# Patient Record
Sex: Male | Born: 1935 | Race: White | Hispanic: No | Marital: Married | Smoking: Former smoker
Health system: Southern US, Community
[De-identification: ages and names within clinical notes are randomized; demographics above are authoritative.]

## PROBLEM LIST (undated history)

## (undated) DIAGNOSIS — Z954 Presence of other heart-valve replacement: Secondary | ICD-10-CM

## (undated) DIAGNOSIS — K429 Umbilical hernia without obstruction or gangrene: Secondary | ICD-10-CM

## (undated) DIAGNOSIS — R5383 Other fatigue: Secondary | ICD-10-CM

## (undated) DIAGNOSIS — E785 Hyperlipidemia, unspecified: Secondary | ICD-10-CM

## (undated) DIAGNOSIS — I214 Non-ST elevation (NSTEMI) myocardial infarction: Secondary | ICD-10-CM

## (undated) DIAGNOSIS — I359 Nonrheumatic aortic valve disorder, unspecified: Secondary | ICD-10-CM

## (undated) DIAGNOSIS — I10 Essential (primary) hypertension: Secondary | ICD-10-CM

## (undated) DIAGNOSIS — R609 Edema, unspecified: Secondary | ICD-10-CM

## (undated) DIAGNOSIS — C3490 Malignant neoplasm of unspecified part of unspecified bronchus or lung: Secondary | ICD-10-CM

## (undated) DIAGNOSIS — R0602 Shortness of breath: Secondary | ICD-10-CM

## (undated) DIAGNOSIS — T887XXA Unspecified adverse effect of drug or medicament, initial encounter: Secondary | ICD-10-CM

## (undated) DIAGNOSIS — I2510 Atherosclerotic heart disease of native coronary artery without angina pectoris: Secondary | ICD-10-CM

## (undated) DIAGNOSIS — L723 Sebaceous cyst: Secondary | ICD-10-CM

## (undated) DIAGNOSIS — J449 Chronic obstructive pulmonary disease, unspecified: Secondary | ICD-10-CM

## (undated) DIAGNOSIS — R5381 Other malaise: Secondary | ICD-10-CM

## (undated) DIAGNOSIS — Z8719 Personal history of other diseases of the digestive system: Secondary | ICD-10-CM

## (undated) DIAGNOSIS — K219 Gastro-esophageal reflux disease without esophagitis: Secondary | ICD-10-CM

## (undated) DIAGNOSIS — H8309 Labyrinthitis, unspecified ear: Secondary | ICD-10-CM

## (undated) DIAGNOSIS — I480 Paroxysmal atrial fibrillation: Secondary | ICD-10-CM

## (undated) DIAGNOSIS — I428 Other cardiomyopathies: Secondary | ICD-10-CM

## (undated) DIAGNOSIS — Z125 Encounter for screening for malignant neoplasm of prostate: Secondary | ICD-10-CM

## (undated) HISTORY — DX: Hyperlipidemia, unspecified: E78.5

## (undated) HISTORY — DX: Encounter for screening for malignant neoplasm of prostate: Z12.5

## (undated) HISTORY — DX: Labyrinthitis, unspecified ear: H83.09

## (undated) HISTORY — DX: Other fatigue: R53.83

## (undated) HISTORY — DX: Other cardiomyopathies: I42.8

## (undated) HISTORY — DX: Edema, unspecified: R60.9

## (undated) HISTORY — DX: Sebaceous cyst: L72.3

## (undated) HISTORY — PX: APPENDECTOMY: SHX54

## (undated) HISTORY — DX: Non-ST elevation (NSTEMI) myocardial infarction: I21.4

## (undated) HISTORY — DX: Personal history of other diseases of the digestive system: Z87.19

## (undated) HISTORY — DX: Other malaise: R53.81

## (undated) HISTORY — DX: Essential (primary) hypertension: I10

## (undated) HISTORY — PX: ABDOMINAL HERNIA REPAIR: SHX539

## (undated) HISTORY — DX: Unspecified adverse effect of drug or medicament, initial encounter: T88.7XXA

## (undated) HISTORY — DX: Umbilical hernia without obstruction or gangrene: K42.9

## (undated) HISTORY — PX: EYE SURGERY: SHX253

## (undated) HISTORY — DX: Presence of other heart-valve replacement: Z95.4

## (undated) HISTORY — DX: Atherosclerotic heart disease of native coronary artery without angina pectoris: I25.10

## (undated) HISTORY — DX: Nonrheumatic aortic valve disorder, unspecified: I35.9

## (undated) HISTORY — DX: Malignant neoplasm of unspecified part of unspecified bronchus or lung: C34.90

---

## 1992-02-11 HISTORY — PX: AORTIC VALVE REPLACEMENT: SHX41

## 2000-05-20 ENCOUNTER — Encounter (INDEPENDENT_AMBULATORY_CARE_PROVIDER_SITE_OTHER): Payer: Self-pay | Admitting: Specialist

## 2000-05-20 ENCOUNTER — Other Ambulatory Visit: Admission: RE | Admit: 2000-05-20 | Discharge: 2000-05-20 | Payer: Self-pay | Admitting: Gastroenterology

## 2001-05-12 ENCOUNTER — Ambulatory Visit: Admission: RE | Admit: 2001-05-12 | Discharge: 2001-05-12 | Payer: Self-pay

## 2002-03-03 ENCOUNTER — Encounter: Payer: Self-pay | Admitting: *Deleted

## 2002-03-03 ENCOUNTER — Ambulatory Visit (HOSPITAL_COMMUNITY): Admission: RE | Admit: 2002-03-03 | Discharge: 2002-03-03 | Payer: Self-pay | Admitting: *Deleted

## 2003-12-29 ENCOUNTER — Ambulatory Visit: Payer: Self-pay

## 2004-01-19 ENCOUNTER — Ambulatory Visit: Payer: Self-pay | Admitting: Internal Medicine

## 2004-02-06 ENCOUNTER — Ambulatory Visit: Payer: Self-pay | Admitting: Cardiology

## 2004-02-22 ENCOUNTER — Ambulatory Visit: Payer: Self-pay | Admitting: *Deleted

## 2004-02-27 ENCOUNTER — Ambulatory Visit: Payer: Self-pay | Admitting: Internal Medicine

## 2004-03-19 ENCOUNTER — Ambulatory Visit: Payer: Self-pay | Admitting: Cardiology

## 2004-04-16 ENCOUNTER — Ambulatory Visit: Payer: Self-pay | Admitting: Cardiovascular Disease

## 2004-05-14 ENCOUNTER — Ambulatory Visit: Payer: Self-pay | Admitting: Cardiology

## 2004-06-11 ENCOUNTER — Ambulatory Visit: Payer: Self-pay | Admitting: *Deleted

## 2004-07-09 ENCOUNTER — Ambulatory Visit: Payer: Self-pay | Admitting: *Deleted

## 2004-08-05 ENCOUNTER — Ambulatory Visit: Payer: Self-pay | Admitting: Internal Medicine

## 2004-08-26 ENCOUNTER — Ambulatory Visit: Payer: Self-pay | Admitting: Internal Medicine

## 2004-09-23 ENCOUNTER — Ambulatory Visit: Payer: Self-pay | Admitting: Cardiology

## 2004-10-15 ENCOUNTER — Ambulatory Visit: Payer: Self-pay | Admitting: Cardiology

## 2004-11-12 ENCOUNTER — Ambulatory Visit: Payer: Self-pay | Admitting: Internal Medicine

## 2004-12-10 ENCOUNTER — Ambulatory Visit: Payer: Self-pay | Admitting: Cardiology

## 2005-01-07 ENCOUNTER — Ambulatory Visit: Payer: Self-pay | Admitting: *Deleted

## 2005-02-04 ENCOUNTER — Ambulatory Visit: Payer: Self-pay | Admitting: Internal Medicine

## 2005-03-04 ENCOUNTER — Ambulatory Visit: Payer: Self-pay | Admitting: *Deleted

## 2005-03-26 ENCOUNTER — Ambulatory Visit: Payer: Self-pay | Admitting: *Deleted

## 2005-04-01 ENCOUNTER — Ambulatory Visit: Payer: Self-pay | Admitting: Cardiology

## 2005-04-01 ENCOUNTER — Ambulatory Visit: Payer: Self-pay | Admitting: *Deleted

## 2005-04-29 ENCOUNTER — Ambulatory Visit: Payer: Self-pay | Admitting: Cardiology

## 2005-05-27 ENCOUNTER — Ambulatory Visit: Payer: Self-pay | Admitting: Cardiology

## 2005-06-24 ENCOUNTER — Ambulatory Visit: Payer: Self-pay | Admitting: Cardiology

## 2005-07-22 ENCOUNTER — Ambulatory Visit: Payer: Self-pay | Admitting: *Deleted

## 2005-08-19 ENCOUNTER — Ambulatory Visit: Payer: Self-pay | Admitting: *Deleted

## 2005-09-24 ENCOUNTER — Ambulatory Visit: Payer: Self-pay

## 2005-09-24 ENCOUNTER — Ambulatory Visit: Payer: Self-pay | Admitting: *Deleted

## 2005-09-24 ENCOUNTER — Encounter: Payer: Self-pay | Admitting: Cardiology

## 2005-09-29 ENCOUNTER — Ambulatory Visit: Payer: Self-pay | Admitting: Cardiovascular Disease

## 2005-09-29 ENCOUNTER — Ambulatory Visit: Payer: Self-pay | Admitting: *Deleted

## 2005-10-15 ENCOUNTER — Ambulatory Visit: Payer: Self-pay | Admitting: *Deleted

## 2005-11-05 ENCOUNTER — Ambulatory Visit: Payer: Self-pay | Admitting: Cardiology

## 2005-12-03 ENCOUNTER — Ambulatory Visit: Payer: Self-pay | Admitting: *Deleted

## 2005-12-31 ENCOUNTER — Ambulatory Visit: Payer: Self-pay | Admitting: Cardiology

## 2006-01-28 ENCOUNTER — Ambulatory Visit: Payer: Self-pay | Admitting: *Deleted

## 2006-02-25 ENCOUNTER — Ambulatory Visit: Payer: Self-pay | Admitting: Cardiology

## 2006-03-25 ENCOUNTER — Ambulatory Visit: Payer: Self-pay | Admitting: *Deleted

## 2006-04-08 ENCOUNTER — Ambulatory Visit: Payer: Self-pay | Admitting: *Deleted

## 2006-04-20 ENCOUNTER — Ambulatory Visit: Payer: Self-pay

## 2006-04-20 ENCOUNTER — Ambulatory Visit: Payer: Self-pay | Admitting: *Deleted

## 2006-04-20 ENCOUNTER — Ambulatory Visit: Payer: Self-pay | Admitting: Internal Medicine

## 2006-04-20 LAB — CONVERTED CEMR LAB
ALT: 22 units/L (ref 0–40)
AST: 28 units/L (ref 0–37)
Albumin: 3.8 g/dL (ref 3.5–5.2)
Alkaline Phosphatase: 77 units/L (ref 39–117)
BUN: 13 mg/dL (ref 6–23)
Bilirubin, Direct: 0.2 mg/dL (ref 0.0–0.3)
CO2: 31 meq/L (ref 19–32)
Calcium: 9 mg/dL (ref 8.4–10.5)
Chloride: 99 meq/L (ref 96–112)
Cholesterol: 105 mg/dL (ref 0–200)
Creatinine, Ser: 0.9 mg/dL (ref 0.4–1.5)
GFR calc Af Amer: 107 mL/min
GFR calc non Af Amer: 89 mL/min
Glucose, Bld: 106 mg/dL — ABNORMAL HIGH (ref 70–99)
HDL: 30.3 mg/dL — ABNORMAL LOW (ref >39.0)
LDL Cholesterol: 44 mg/dL (ref 0–99)
Potassium: 4.2 meq/L (ref 3.5–5.1)
Sodium: 136 meq/L (ref 135–145)
Total Bilirubin: 0.9 mg/dL (ref 0.3–1.2)
Total CHOL/HDL Ratio: 3.5
Total Protein: 7.1 g/dL (ref 6.0–8.3)
Triglycerides: 155 mg/dL — ABNORMAL HIGH (ref 0–149)
VLDL: 31 mg/dL (ref 0–40)

## 2006-05-18 ENCOUNTER — Ambulatory Visit: Payer: Self-pay | Admitting: Cardiology

## 2006-06-15 ENCOUNTER — Ambulatory Visit: Payer: Self-pay | Admitting: Cardiovascular Disease

## 2006-07-07 ENCOUNTER — Ambulatory Visit: Payer: Self-pay | Admitting: Cardiology

## 2006-07-21 ENCOUNTER — Ambulatory Visit: Payer: Self-pay | Admitting: Cardiology

## 2006-08-04 ENCOUNTER — Ambulatory Visit: Payer: Self-pay | Admitting: Cardiology

## 2006-08-25 ENCOUNTER — Ambulatory Visit: Payer: Self-pay | Admitting: Cardiovascular Disease

## 2006-09-15 ENCOUNTER — Ambulatory Visit: Payer: Self-pay | Admitting: Cardiology

## 2006-09-29 ENCOUNTER — Ambulatory Visit: Payer: Self-pay | Admitting: Cardiology

## 2006-10-06 ENCOUNTER — Ambulatory Visit: Payer: Self-pay | Admitting: Cardiovascular Disease

## 2006-10-20 ENCOUNTER — Ambulatory Visit: Payer: Self-pay | Admitting: Cardiology

## 2006-11-17 ENCOUNTER — Ambulatory Visit: Payer: Self-pay | Admitting: Cardiovascular Disease

## 2006-12-18 ENCOUNTER — Ambulatory Visit: Payer: Self-pay | Admitting: Cardiology

## 2007-01-01 ENCOUNTER — Ambulatory Visit: Payer: Self-pay | Admitting: Internal Medicine

## 2007-01-18 ENCOUNTER — Ambulatory Visit: Payer: Self-pay | Admitting: Internal Medicine

## 2007-01-29 ENCOUNTER — Ambulatory Visit: Payer: Self-pay | Admitting: Cardiology

## 2007-02-19 ENCOUNTER — Ambulatory Visit: Payer: Self-pay | Admitting: Cardiology

## 2007-03-08 ENCOUNTER — Encounter: Payer: Self-pay | Admitting: Internal Medicine

## 2007-03-08 DIAGNOSIS — E785 Hyperlipidemia, unspecified: Secondary | ICD-10-CM | POA: Insufficient documentation

## 2007-03-08 DIAGNOSIS — I10 Essential (primary) hypertension: Secondary | ICD-10-CM | POA: Insufficient documentation

## 2007-03-08 DIAGNOSIS — I359 Nonrheumatic aortic valve disorder, unspecified: Secondary | ICD-10-CM | POA: Insufficient documentation

## 2007-03-09 ENCOUNTER — Ambulatory Visit: Payer: Self-pay | Admitting: Internal Medicine

## 2007-03-09 DIAGNOSIS — R609 Edema, unspecified: Secondary | ICD-10-CM

## 2007-03-09 LAB — CONVERTED CEMR LAB
ALT: 21 units/L (ref 0–53)
Alkaline Phosphatase: 77 units/L (ref 39–117)
BUN: 13 mg/dL (ref 6–23)
CO2: 30 meq/L (ref 19–32)
Calcium: 9.6 mg/dL (ref 8.4–10.5)
Chloride: 99 meq/L (ref 96–112)
Creatinine, Ser: 0.9 mg/dL (ref 0.4–1.5)
HDL: 33.4 mg/dL — ABNORMAL LOW (ref >39.0)
Total Bilirubin: 1.4 mg/dL — ABNORMAL HIGH (ref 0.3–1.2)
Total Protein: 7.4 g/dL (ref 6.0–8.3)

## 2007-03-10 DIAGNOSIS — Z8719 Personal history of other diseases of the digestive system: Secondary | ICD-10-CM

## 2007-03-11 ENCOUNTER — Encounter: Payer: Self-pay | Admitting: Internal Medicine

## 2007-03-12 ENCOUNTER — Ambulatory Visit: Payer: Self-pay | Admitting: Cardiology

## 2007-03-30 ENCOUNTER — Encounter: Payer: Self-pay | Admitting: Internal Medicine

## 2007-04-06 ENCOUNTER — Encounter: Payer: Self-pay | Admitting: Internal Medicine

## 2007-04-09 ENCOUNTER — Ambulatory Visit: Payer: Self-pay | Admitting: Internal Medicine

## 2007-04-14 ENCOUNTER — Ambulatory Visit: Payer: Self-pay | Admitting: Cardiovascular Disease

## 2007-05-07 ENCOUNTER — Ambulatory Visit: Payer: Self-pay | Admitting: Cardiology

## 2007-06-04 ENCOUNTER — Ambulatory Visit: Payer: Self-pay | Admitting: Internal Medicine

## 2007-06-25 ENCOUNTER — Ambulatory Visit: Payer: Self-pay | Admitting: Cardiology

## 2007-07-16 ENCOUNTER — Ambulatory Visit: Payer: Self-pay | Admitting: Cardiovascular Disease

## 2007-08-12 ENCOUNTER — Ambulatory Visit: Payer: Self-pay | Admitting: Cardiology

## 2007-08-23 ENCOUNTER — Ambulatory Visit: Payer: Self-pay | Admitting: Cardiology

## 2007-09-03 ENCOUNTER — Ambulatory Visit: Payer: Self-pay | Admitting: Internal Medicine

## 2007-09-03 DIAGNOSIS — H8309 Labyrinthitis, unspecified ear: Secondary | ICD-10-CM | POA: Insufficient documentation

## 2007-09-13 ENCOUNTER — Ambulatory Visit: Payer: Self-pay | Admitting: Cardiology

## 2007-10-04 ENCOUNTER — Ambulatory Visit: Payer: Self-pay | Admitting: Internal Medicine

## 2007-10-24 ENCOUNTER — Encounter: Payer: Self-pay | Admitting: Internal Medicine

## 2007-10-25 ENCOUNTER — Ambulatory Visit: Payer: Self-pay | Admitting: Cardiology

## 2007-11-22 ENCOUNTER — Ambulatory Visit: Payer: Self-pay | Admitting: Cardiology

## 2007-12-07 ENCOUNTER — Ambulatory Visit: Payer: Self-pay | Admitting: Cardiovascular Disease

## 2007-12-20 ENCOUNTER — Ambulatory Visit: Payer: Self-pay | Admitting: Cardiology

## 2007-12-22 ENCOUNTER — Ambulatory Visit: Payer: Self-pay | Admitting: Cardiovascular Disease

## 2007-12-22 ENCOUNTER — Encounter: Payer: Self-pay | Admitting: Cardiovascular Disease

## 2007-12-22 LAB — CONVERTED CEMR LAB
CO2: 30 meq/L (ref 19–32)
Calcium: 9.2 mg/dL (ref 8.4–10.5)
Creatinine, Ser: 0.9 mg/dL (ref 0.4–1.5)
Glucose, Bld: 93 mg/dL (ref 70–99)

## 2008-01-10 ENCOUNTER — Ambulatory Visit: Payer: Self-pay | Admitting: Cardiovascular Disease

## 2008-01-25 ENCOUNTER — Encounter: Payer: Self-pay | Admitting: Emergency Medicine

## 2008-01-26 ENCOUNTER — Encounter: Payer: Self-pay | Admitting: Emergency Medicine

## 2008-02-07 ENCOUNTER — Ambulatory Visit: Payer: Self-pay | Admitting: Cardiology

## 2008-02-28 ENCOUNTER — Encounter: Payer: Self-pay | Admitting: Internal Medicine

## 2008-03-03 DIAGNOSIS — Z954 Presence of other heart-valve replacement: Secondary | ICD-10-CM

## 2008-03-06 ENCOUNTER — Ambulatory Visit: Payer: Self-pay | Admitting: Cardiology

## 2008-04-03 ENCOUNTER — Ambulatory Visit: Payer: Self-pay | Admitting: Cardiology

## 2008-04-17 ENCOUNTER — Ambulatory Visit: Payer: Self-pay | Admitting: Cardiology

## 2008-05-05 ENCOUNTER — Ambulatory Visit: Payer: Self-pay | Admitting: Cardiology

## 2008-05-18 ENCOUNTER — Encounter: Payer: Self-pay | Admitting: Cardiovascular Disease

## 2008-05-18 ENCOUNTER — Ambulatory Visit: Payer: Self-pay | Admitting: Cardiovascular Disease

## 2008-05-18 DIAGNOSIS — R5383 Other fatigue: Secondary | ICD-10-CM

## 2008-05-18 DIAGNOSIS — R5381 Other malaise: Secondary | ICD-10-CM

## 2008-05-18 LAB — CONVERTED CEMR LAB
ALT: 16 units/L (ref 0–53)
Albumin: 4.2 g/dL (ref 3.5–5.2)
Alkaline Phosphatase: 86 units/L (ref 39–117)
BUN: 22 mg/dL (ref 6–23)
Basophils Absolute: 0 10*3/uL (ref 0.0–0.1)
CO2: 28 meq/L (ref 19–32)
Calcium: 9.5 mg/dL (ref 8.4–10.5)
Eosinophils Absolute: 0.6 10*3/uL (ref 0.0–0.7)
GFR calc non Af Amer: 57.52 mL/min (ref >60)
Glucose, Bld: 99 mg/dL (ref 70–99)
Hemoglobin: 13.5 g/dL (ref 13.0–17.0)
Lymphocytes Relative: 25.2 % (ref 12.0–46.0)
Monocytes Relative: 9.8 % (ref 3.0–12.0)
Neutro Abs: 5.4 10*3/uL (ref 1.4–7.7)
Neutrophils Relative %: 58.2 % (ref 43.0–77.0)
Potassium: 4.4 meq/L (ref 3.5–5.1)
RDW: 12.4 % (ref 11.5–14.6)
Total Protein: 8.1 g/dL (ref 6.0–8.3)

## 2008-06-02 ENCOUNTER — Ambulatory Visit: Payer: Self-pay | Admitting: Cardiology

## 2008-06-30 ENCOUNTER — Ambulatory Visit: Payer: Self-pay | Admitting: Cardiology

## 2008-07-07 ENCOUNTER — Telehealth: Payer: Self-pay | Admitting: Cardiovascular Disease

## 2008-07-11 ENCOUNTER — Encounter: Payer: Self-pay | Admitting: *Deleted

## 2008-07-28 ENCOUNTER — Ambulatory Visit: Payer: Self-pay | Admitting: Internal Medicine

## 2008-07-28 LAB — CONVERTED CEMR LAB
POC INR: 2
Protime: 17.5

## 2008-08-16 ENCOUNTER — Encounter: Payer: Self-pay | Admitting: *Deleted

## 2008-08-18 ENCOUNTER — Ambulatory Visit: Payer: Self-pay | Admitting: Cardiology

## 2008-08-18 ENCOUNTER — Encounter (INDEPENDENT_AMBULATORY_CARE_PROVIDER_SITE_OTHER): Payer: Self-pay | Admitting: Cardiology

## 2008-08-18 LAB — CONVERTED CEMR LAB
POC INR: 2.3
Prothrombin Time: 18.5 s

## 2008-09-15 ENCOUNTER — Ambulatory Visit: Payer: Self-pay | Admitting: Cardiology

## 2008-09-29 ENCOUNTER — Ambulatory Visit: Payer: Self-pay | Admitting: Cardiology

## 2008-09-29 LAB — CONVERTED CEMR LAB: POC INR: 2.5

## 2008-10-20 ENCOUNTER — Ambulatory Visit: Payer: Self-pay | Admitting: Internal Medicine

## 2008-11-17 ENCOUNTER — Ambulatory Visit: Payer: Self-pay | Admitting: Internal Medicine

## 2008-12-15 ENCOUNTER — Ambulatory Visit: Payer: Self-pay | Admitting: Cardiovascular Disease

## 2008-12-15 LAB — CONVERTED CEMR LAB: POC INR: 2.3

## 2008-12-18 ENCOUNTER — Ambulatory Visit: Payer: Self-pay | Admitting: Cardiovascular Disease

## 2009-01-05 ENCOUNTER — Ambulatory Visit: Payer: Self-pay | Admitting: Internal Medicine

## 2009-01-05 ENCOUNTER — Ambulatory Visit: Payer: Self-pay | Admitting: Cardiovascular Disease

## 2009-01-05 DIAGNOSIS — I214 Non-ST elevation (NSTEMI) myocardial infarction: Secondary | ICD-10-CM

## 2009-01-05 DIAGNOSIS — I2510 Atherosclerotic heart disease of native coronary artery without angina pectoris: Secondary | ICD-10-CM | POA: Insufficient documentation

## 2009-01-05 LAB — CONVERTED CEMR LAB: POC INR: 3.2

## 2009-02-06 ENCOUNTER — Ambulatory Visit: Payer: Self-pay | Admitting: Cardiology

## 2009-02-06 LAB — CONVERTED CEMR LAB: POC INR: 2.2

## 2009-02-10 HISTORY — PX: LOBECTOMY: SHX5089

## 2009-02-23 ENCOUNTER — Ambulatory Visit: Payer: Self-pay | Admitting: Internal Medicine

## 2009-02-23 LAB — CONVERTED CEMR LAB: POC INR: 3.5

## 2009-03-19 ENCOUNTER — Ambulatory Visit: Payer: Self-pay | Admitting: Internal Medicine

## 2009-03-19 DIAGNOSIS — L723 Sebaceous cyst: Secondary | ICD-10-CM | POA: Insufficient documentation

## 2009-03-20 ENCOUNTER — Ambulatory Visit: Payer: Self-pay | Admitting: Cardiology

## 2009-03-20 DIAGNOSIS — K429 Umbilical hernia without obstruction or gangrene: Secondary | ICD-10-CM | POA: Insufficient documentation

## 2009-03-20 LAB — CONVERTED CEMR LAB: POC INR: 3.8

## 2009-04-02 ENCOUNTER — Encounter: Payer: Self-pay | Admitting: Emergency Medicine

## 2009-04-17 ENCOUNTER — Ambulatory Visit: Payer: Self-pay | Admitting: Cardiology

## 2009-04-17 LAB — CONVERTED CEMR LAB: POC INR: 4.3

## 2009-05-01 ENCOUNTER — Ambulatory Visit: Payer: Self-pay | Admitting: Cardiology

## 2009-05-01 ENCOUNTER — Ambulatory Visit: Payer: Self-pay | Admitting: Internal Medicine

## 2009-05-01 DIAGNOSIS — T887XXA Unspecified adverse effect of drug or medicament, initial encounter: Secondary | ICD-10-CM

## 2009-05-01 LAB — CONVERTED CEMR LAB
CO2: 28 meq/L (ref 19–32)
Calcium: 9.1 mg/dL (ref 8.4–10.5)
Glucose, Bld: 88 mg/dL (ref 70–99)
Potassium: 4.3 meq/L (ref 3.5–5.1)
Sodium: 139 meq/L (ref 135–145)
Total CHOL/HDL Ratio: 6
Triglycerides: 259 mg/dL — ABNORMAL HIGH (ref 0.0–149.0)

## 2009-05-05 ENCOUNTER — Encounter: Payer: Self-pay | Admitting: Internal Medicine

## 2009-05-28 ENCOUNTER — Encounter: Payer: Self-pay | Admitting: Emergency Medicine

## 2009-05-29 ENCOUNTER — Encounter: Payer: Self-pay | Admitting: Emergency Medicine

## 2009-05-30 ENCOUNTER — Ambulatory Visit: Payer: Self-pay | Admitting: Cardiology

## 2009-05-30 LAB — CONVERTED CEMR LAB: POC INR: 2.5

## 2009-06-11 ENCOUNTER — Encounter: Payer: Self-pay | Admitting: Emergency Medicine

## 2009-06-11 ENCOUNTER — Ambulatory Visit (HOSPITAL_COMMUNITY): Admission: RE | Admit: 2009-06-11 | Discharge: 2009-06-11 | Payer: Self-pay | Admitting: Specialist

## 2009-06-15 DIAGNOSIS — I428 Other cardiomyopathies: Secondary | ICD-10-CM | POA: Insufficient documentation

## 2009-06-20 ENCOUNTER — Ambulatory Visit: Payer: Self-pay | Admitting: Cardiovascular Disease

## 2009-06-27 ENCOUNTER — Ambulatory Visit: Payer: Self-pay | Admitting: Cardiology

## 2009-06-27 ENCOUNTER — Encounter: Payer: Self-pay | Admitting: Internal Medicine

## 2009-06-27 ENCOUNTER — Ambulatory Visit: Payer: Self-pay | Admitting: Thoracic Surgery

## 2009-06-27 LAB — CONVERTED CEMR LAB: POC INR: 2.1

## 2009-07-05 ENCOUNTER — Ambulatory Visit: Payer: Self-pay | Admitting: Emergency Medicine

## 2009-07-06 ENCOUNTER — Ambulatory Visit (HOSPITAL_COMMUNITY): Admission: RE | Admit: 2009-07-06 | Discharge: 2009-07-06 | Payer: Self-pay | Admitting: Emergency Medicine

## 2009-07-06 ENCOUNTER — Encounter: Payer: Self-pay | Admitting: Emergency Medicine

## 2009-07-11 ENCOUNTER — Ambulatory Visit: Payer: Self-pay | Admitting: Thoracic Surgery

## 2009-07-11 ENCOUNTER — Encounter: Payer: Self-pay | Admitting: Emergency Medicine

## 2009-07-18 ENCOUNTER — Ambulatory Visit: Payer: Self-pay | Admitting: Cardiology

## 2009-07-18 LAB — CONVERTED CEMR LAB: POC INR: 2.9

## 2009-07-31 ENCOUNTER — Ambulatory Visit: Payer: Self-pay | Admitting: Emergency Medicine

## 2009-07-31 DIAGNOSIS — J441 Chronic obstructive pulmonary disease with (acute) exacerbation: Secondary | ICD-10-CM | POA: Insufficient documentation

## 2009-07-31 LAB — CONVERTED CEMR LAB
BUN: 9 mg/dL (ref 6–23)
Creatinine, Ser: 0.7 mg/dL (ref 0.4–1.5)
GFR calc non Af Amer: 117.12 mL/min (ref >60)
Potassium: 4.4 meq/L (ref 3.5–5.1)

## 2009-08-15 ENCOUNTER — Ambulatory Visit: Payer: Self-pay | Admitting: Internal Medicine

## 2009-08-15 LAB — CONVERTED CEMR LAB: POC INR: 3

## 2009-08-20 ENCOUNTER — Ambulatory Visit: Payer: Self-pay | Admitting: Internal Medicine

## 2009-08-20 DIAGNOSIS — J309 Allergic rhinitis, unspecified: Secondary | ICD-10-CM | POA: Insufficient documentation

## 2009-08-20 LAB — CONVERTED CEMR LAB
Cholesterol: 165 mg/dL (ref 0–200)
Direct LDL: 92.2 mg/dL
Total CHOL/HDL Ratio: 5
Triglycerides: 248 mg/dL — ABNORMAL HIGH (ref 0.0–149.0)
VLDL: 49.6 mg/dL — ABNORMAL HIGH (ref 0.0–40.0)

## 2009-09-17 ENCOUNTER — Ambulatory Visit: Payer: Self-pay | Admitting: Cardiovascular Disease

## 2009-09-18 ENCOUNTER — Encounter: Payer: Self-pay | Admitting: Cardiology

## 2009-09-18 ENCOUNTER — Ambulatory Visit: Payer: Self-pay | Admitting: Emergency Medicine

## 2009-09-18 ENCOUNTER — Ambulatory Visit: Payer: Self-pay | Admitting: Cardiology

## 2009-09-24 ENCOUNTER — Ambulatory Visit (HOSPITAL_COMMUNITY): Admission: RE | Admit: 2009-09-24 | Discharge: 2009-09-24 | Payer: Self-pay | Admitting: Thoracic Surgery

## 2009-09-24 ENCOUNTER — Ambulatory Visit: Payer: Self-pay | Admitting: Emergency Medicine

## 2009-09-26 ENCOUNTER — Encounter: Payer: Self-pay | Admitting: Emergency Medicine

## 2009-09-26 ENCOUNTER — Ambulatory Visit: Payer: Self-pay | Admitting: Thoracic Surgery

## 2009-10-01 ENCOUNTER — Telehealth (INDEPENDENT_AMBULATORY_CARE_PROVIDER_SITE_OTHER): Payer: Self-pay | Admitting: *Deleted

## 2009-10-02 ENCOUNTER — Ambulatory Visit: Payer: Self-pay | Admitting: Cardiovascular Disease

## 2009-10-03 ENCOUNTER — Ambulatory Visit: Payer: Self-pay | Admitting: Emergency Medicine

## 2009-10-03 DIAGNOSIS — C3490 Malignant neoplasm of unspecified part of unspecified bronchus or lung: Secondary | ICD-10-CM

## 2009-10-03 DIAGNOSIS — R042 Hemoptysis: Secondary | ICD-10-CM | POA: Insufficient documentation

## 2009-10-16 ENCOUNTER — Ambulatory Visit: Payer: Self-pay | Admitting: Thoracic Surgery

## 2009-10-16 ENCOUNTER — Inpatient Hospital Stay (HOSPITAL_COMMUNITY): Admission: RE | Admit: 2009-10-16 | Discharge: 2009-11-10 | Payer: Self-pay | Admitting: Thoracic Surgery

## 2009-10-16 ENCOUNTER — Encounter: Payer: Self-pay | Admitting: Thoracic Surgery

## 2009-10-16 ENCOUNTER — Ambulatory Visit: Payer: Self-pay | Admitting: Critical Care Medicine

## 2009-10-16 ENCOUNTER — Ambulatory Visit: Payer: Self-pay | Admitting: Internal Medicine

## 2009-10-23 ENCOUNTER — Encounter: Payer: Self-pay | Admitting: Cardiology

## 2009-10-29 ENCOUNTER — Ambulatory Visit: Payer: Self-pay | Admitting: Physical Medicine & Rehabilitation

## 2009-11-13 ENCOUNTER — Encounter: Payer: Self-pay | Admitting: Cardiology

## 2009-11-13 LAB — CONVERTED CEMR LAB: Prothrombin Time: 40.5 s

## 2009-11-19 ENCOUNTER — Encounter: Payer: Self-pay | Admitting: Cardiology

## 2009-11-19 LAB — CONVERTED CEMR LAB: INR: 6.9

## 2009-11-20 ENCOUNTER — Encounter: Admission: RE | Admit: 2009-11-20 | Discharge: 2009-11-20 | Payer: Self-pay | Admitting: Thoracic Surgery

## 2009-11-20 ENCOUNTER — Ambulatory Visit: Payer: Self-pay | Admitting: Thoracic Surgery

## 2009-11-20 ENCOUNTER — Encounter: Payer: Self-pay | Admitting: Cardiovascular Disease

## 2009-11-23 ENCOUNTER — Telehealth: Payer: Self-pay | Admitting: Cardiovascular Disease

## 2009-11-23 ENCOUNTER — Encounter: Payer: Self-pay | Admitting: Cardiology

## 2009-11-23 LAB — CONVERTED CEMR LAB
POC INR: 1.7
Prothrombin Time: 20.3 s

## 2009-11-26 ENCOUNTER — Encounter: Payer: Self-pay | Admitting: Cardiology

## 2009-11-26 ENCOUNTER — Ambulatory Visit: Payer: Self-pay | Admitting: Emergency Medicine

## 2009-11-26 LAB — CONVERTED CEMR LAB: POC INR: 1.7

## 2009-11-27 ENCOUNTER — Encounter: Payer: Self-pay | Admitting: Cardiology

## 2009-11-27 LAB — CONVERTED CEMR LAB
Calcium: 8.4 mg/dL (ref 8.4–10.5)
GFR calc non Af Amer: 74.94 mL/min (ref >60)
INR: 1.7 — ABNORMAL HIGH (ref 0.8–1.0)
Potassium: 4.5 meq/L (ref 3.5–5.1)
Pro B Natriuretic peptide (BNP): 559.1 pg/mL — ABNORMAL HIGH (ref 0.0–100.0)
Prothrombin Time: 17.8 s — ABNORMAL HIGH (ref 9.7–11.8)
Sodium: 137 meq/L (ref 135–145)

## 2009-11-29 ENCOUNTER — Ambulatory Visit: Payer: Self-pay | Admitting: Emergency Medicine

## 2009-11-29 DIAGNOSIS — I4891 Unspecified atrial fibrillation: Secondary | ICD-10-CM | POA: Insufficient documentation

## 2009-12-04 ENCOUNTER — Encounter: Payer: Self-pay | Admitting: Cardiology

## 2009-12-04 ENCOUNTER — Encounter: Payer: Self-pay | Admitting: Cardiovascular Disease

## 2009-12-04 ENCOUNTER — Telehealth: Payer: Self-pay | Admitting: Cardiovascular Disease

## 2009-12-04 ENCOUNTER — Ambulatory Visit: Payer: Self-pay | Admitting: Thoracic Surgery

## 2009-12-04 ENCOUNTER — Encounter: Admission: RE | Admit: 2009-12-04 | Discharge: 2009-12-04 | Payer: Self-pay | Admitting: Thoracic Surgery

## 2009-12-06 ENCOUNTER — Ambulatory Visit: Payer: Self-pay | Admitting: Cardiovascular Disease

## 2009-12-07 ENCOUNTER — Encounter: Payer: Self-pay | Admitting: Cardiovascular Disease

## 2009-12-12 ENCOUNTER — Encounter: Payer: Self-pay | Admitting: Cardiology

## 2009-12-13 ENCOUNTER — Encounter: Payer: Self-pay | Admitting: Cardiovascular Disease

## 2009-12-24 ENCOUNTER — Encounter: Payer: Self-pay | Admitting: Cardiovascular Disease

## 2009-12-24 ENCOUNTER — Encounter: Payer: Self-pay | Admitting: Internal Medicine

## 2009-12-24 LAB — CONVERTED CEMR LAB
POC INR: 2.7
Prothrombin Time: 31.8 s

## 2009-12-25 ENCOUNTER — Encounter: Admission: RE | Admit: 2009-12-25 | Discharge: 2009-12-25 | Payer: Self-pay | Admitting: Thoracic Surgery

## 2009-12-25 ENCOUNTER — Encounter: Payer: Self-pay | Admitting: Cardiovascular Disease

## 2009-12-25 ENCOUNTER — Ambulatory Visit: Payer: Self-pay | Admitting: Thoracic Surgery

## 2009-12-28 ENCOUNTER — Telehealth: Payer: Self-pay | Admitting: Cardiovascular Disease

## 2010-01-01 ENCOUNTER — Ambulatory Visit: Payer: Self-pay | Admitting: Cardiovascular Disease

## 2010-01-01 ENCOUNTER — Encounter: Payer: Self-pay | Admitting: Cardiovascular Disease

## 2010-01-01 LAB — CONVERTED CEMR LAB: POC INR: 3.2

## 2010-01-02 ENCOUNTER — Encounter: Payer: Self-pay | Admitting: Cardiovascular Disease

## 2010-01-08 ENCOUNTER — Encounter: Payer: Self-pay | Admitting: Cardiovascular Disease

## 2010-01-08 ENCOUNTER — Ambulatory Visit: Payer: Self-pay | Admitting: Emergency Medicine

## 2010-01-09 ENCOUNTER — Encounter: Payer: Self-pay | Admitting: Internal Medicine

## 2010-01-10 ENCOUNTER — Ambulatory Visit (HOSPITAL_COMMUNITY)
Admission: RE | Admit: 2010-01-10 | Discharge: 2010-01-10 | Payer: Self-pay | Source: Home / Self Care | Admitting: Cardiovascular Disease

## 2010-01-10 ENCOUNTER — Ambulatory Visit: Payer: Self-pay | Admitting: Cardiovascular Disease

## 2010-01-17 ENCOUNTER — Encounter: Payer: Self-pay | Admitting: Cardiovascular Disease

## 2010-01-18 ENCOUNTER — Encounter: Payer: Self-pay | Admitting: Internal Medicine

## 2010-01-18 LAB — CONVERTED CEMR LAB
POC INR: 3.2
Prothrombin Time: 38.8 s

## 2010-01-22 ENCOUNTER — Telehealth (INDEPENDENT_AMBULATORY_CARE_PROVIDER_SITE_OTHER): Payer: Self-pay | Admitting: *Deleted

## 2010-01-23 ENCOUNTER — Inpatient Hospital Stay (HOSPITAL_COMMUNITY)
Admission: AD | Admit: 2010-01-23 | Discharge: 2010-01-29 | Payer: Self-pay | Source: Home / Self Care | Attending: Emergency Medicine | Admitting: Emergency Medicine

## 2010-01-23 ENCOUNTER — Encounter: Payer: Self-pay | Admitting: Internal Medicine

## 2010-01-23 ENCOUNTER — Ambulatory Visit: Payer: Self-pay | Admitting: Emergency Medicine

## 2010-01-25 ENCOUNTER — Encounter: Payer: Self-pay | Admitting: Cardiovascular Disease

## 2010-01-25 ENCOUNTER — Encounter: Payer: Self-pay | Admitting: Emergency Medicine

## 2010-01-25 ENCOUNTER — Encounter: Payer: Self-pay | Admitting: Internal Medicine

## 2010-01-28 ENCOUNTER — Ambulatory Visit: Payer: Self-pay | Admitting: Cardiovascular Disease

## 2010-01-31 ENCOUNTER — Encounter: Payer: Self-pay | Admitting: Cardiology

## 2010-02-05 ENCOUNTER — Encounter: Payer: Self-pay | Admitting: Cardiology

## 2010-02-06 ENCOUNTER — Ambulatory Visit: Payer: Self-pay | Admitting: Thoracic Surgery

## 2010-02-06 ENCOUNTER — Ambulatory Visit: Payer: Self-pay | Admitting: Emergency Medicine

## 2010-02-08 ENCOUNTER — Ambulatory Visit: Payer: Self-pay | Admitting: Cardiovascular Disease

## 2010-02-08 ENCOUNTER — Encounter: Payer: Self-pay | Admitting: Physician Assistant

## 2010-02-08 DIAGNOSIS — J189 Pneumonia, unspecified organism: Secondary | ICD-10-CM | POA: Insufficient documentation

## 2010-02-08 LAB — CONVERTED CEMR LAB: POC INR: 4.2

## 2010-02-13 ENCOUNTER — Encounter
Admission: RE | Admit: 2010-02-13 | Discharge: 2010-02-13 | Payer: Self-pay | Source: Home / Self Care | Attending: Thoracic Surgery | Admitting: Thoracic Surgery

## 2010-02-13 ENCOUNTER — Encounter: Payer: Self-pay | Admitting: Emergency Medicine

## 2010-02-13 ENCOUNTER — Encounter: Payer: Self-pay | Admitting: Cardiovascular Disease

## 2010-02-14 ENCOUNTER — Encounter: Payer: Self-pay | Admitting: Cardiovascular Disease

## 2010-02-15 ENCOUNTER — Encounter: Payer: Self-pay | Admitting: Internal Medicine

## 2010-02-15 LAB — CONVERTED CEMR LAB
INR: 5.9
POC INR: 6
Prothrombin Time: 60.1 s

## 2010-02-22 ENCOUNTER — Telehealth (INDEPENDENT_AMBULATORY_CARE_PROVIDER_SITE_OTHER): Payer: Self-pay | Admitting: *Deleted

## 2010-02-22 ENCOUNTER — Encounter: Payer: Self-pay | Admitting: Cardiology

## 2010-02-22 LAB — CONVERTED CEMR LAB
POC INR: 5.3
Prothrombin Time: 63.3 s

## 2010-02-28 ENCOUNTER — Encounter: Payer: Self-pay | Admitting: Cardiovascular Disease

## 2010-03-01 ENCOUNTER — Encounter: Payer: Self-pay | Admitting: Cardiology

## 2010-03-03 NOTE — Discharge Summary (Addendum)
John [name], John [name]               ACCOUNT NO.:  [id number]  MEDICAL RECORD NO.:  [id number]          PATIENT TYPE:  INP  LOCATION:  6715                         FACILITY:  [facility]  PHYSICIAN:  Leslye Peer, MD    DATE OF BIRTH:  05-21-1933  DATE OF ADMISSION:  01/23/2010 DATE OF DISCHARGE:  01/29/2010                              DISCHARGE SUMMARY   DISCHARGE DIAGNOSES: 1. 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 2. Chronic right pleural effusion, status post previous right lower     lobectomy. 3. Nonischemic cardiomyopathy. 4. Paroxysmal atrial fibrillation. 5. History of prosthetic St. Jude's valve.  CONSULTANTS:  Veverly Fells. Excell Seltzer, MD with Cardiology.  PROCEDURES:  None.  LABORATORY DATA:  On January 27, 2010:  PTs 40.9, INR 4.27.  January 28, 2010:  PT 37.7, INR 3.84.  January 29, 2010:  PT is 29.2 and INR is 2.75.  CBC on January 28, 2010:  White blood cell count 13.9, hemoglobin 10.6, hematocrit 38.4, platelet count 333.  BNP on January 26, 2010:  556.  Blood chemistry on January 26, 2010:  Sodium 136, potassium 4.6, chloride 98, CO2 of 30, glucose 134, BUN 28, creatinine 1.23.  Chest x-ray on January 28, 2010: Demonstrates persistent right pleural fluid with improved left-sided airspace disease.  BRIEF HISTORY:  This is a pleasant 77 year old male patient with known history of chronic obstructive pulmonary disease, St. Jude's aortic valve replacement for aortic insufficiency in 1994, and known history of non-small cell lung cancer status post lobectomy in August 2011, which at that time was complicated postoperative hemothorax requiring repeat evacuation.  He presents to the office on January 23, 2010 with increased wheezing, productive cough with blood-tinged, clear  urine, progressive chest tightness, and worsening dyspnea.  This all initiated approximately some time after his recent cardioversion, which was on January 11, 2010.  HOSPITAL COURSE BY DISCHARGE DIAGNOSES: 1. Acute-on-chronic respiratory failure secondary to acute pulmonary     infiltrates superimposed on underlying chronic obstructive     pulmonary disease and known cardiomyopathy:  John [name] was     admitted to the regular medical ward.  Therapeutic interventions     included supplemental oxygen, inhaled corticosteroids, and long-     acting beta-agonist inhalation therapy.  Additionally, he was given     a steroid trial, and after consultation with Cardiology was agreed     to discontinue his amiodarone.  In addition to inhaled     corticosteroids, he was given initially IV systemic steroids, this     was followed by oral systemic steroids.  Ultimately, it was felt     following evaluation that John [name] likely had amiodarone toxicity     resulting in pneumonitis.  This was further complicated by his     underlying chronic obstructive pulmonary disease with poor     mucociliary clearance following lung cancer and prolonged     hospitalization, even  further complicated by volume excess in the     setting of his cardiomyopathy.  Ultimately, John [name] was treated     with 5 days of empiric Avelox, however, was not felt to have     significant infection.  He seemed to respond nicely to steroid     dosing and discontinuation of his amiodarone.  Ultimately, Mr.     [name] will be discharged to home to continue minimally 3-week     course of prednisone at 40 mg.  Following that, he will undergo a     slow taper.  He will no longer continue amiodarone given the     concern for toxicity and resultant pneumonitis.  Additionally, he     will continue aggressive pulmonary hygiene with flutter valve given     his poor mucociliary clearance.  Additionally, he will continue     triple combo  therapy in the form of Spiriva, inhaled     corticosteroid, and long-acting beta agonist.  He will follow     closely in the outpatient setting with pulmonary, for both followup     of his pulmonary status, as well as further evaluation of his     steroid requirements. 2. Paroxysmal atrial fibrillation with a history of cardiomyopathy and     prior St. Jude's aortic valve replacement.  For this again, we did     ask Cardiology to evaluate him.  He had his amiodarone discontinued     during his hospitalization.  Additionally, he was placed on     metoprolol for rate control.  His Coumadin was continued and dosed     by Pharmacy.  He did become supratherapeutic while on Avelox, this     is now normalized.  Ultimately, he will be discharged to home with     outpatient followup with Cardiology.  His cardiac regimen will be     Lopressor twice daily, as well as anticoagulation through the     Coumadin Clinic.  DISCHARGE INSTRUCTIONS:  Diet as tolerated.  DISCHARGE ACTIVITY:  Activity as tolerated.  DISCHARGE MEDICATIONS: 1. Oxygen 4 liters at all times. 2. Fluticasone Diskus 50 mcg inhaled each nare 2 sprays daily. 3. Tylenol PM at bedtime p.r.n. 4. Symbicort 80/4.5 two puffs b.i.d. 5. Prednisone 20 mg, take 2 tablets daily. 6. Warfarin 5 mg, he will take 2.5 mg on Monday, Wednesday, Friday,     and Sunday and 1 tablet on Tuesday, Thursday, and Saturday. 7. Spiriva 18 mcg cap inhaled daily. 8. Metoprolol 50 mg p.o. b.i.d. 9. Mucinex 600 mg p.o. b.i.d. 10.Pepcid 20 mg daily p.r.n. 11.Ferrous sulfate 325 p.o. b.i.d. 12.Lasix 80 mg daily. 13.Empiric coated aspirin 81 mg daily. 14.Potassium chloride 20 mEq daily. 15.Albuterol inhaler 2 puffs q.4 p.r.n.  FOLLOWUP:  Nurse practitioner, Rubye Oaks on February 06, 2010 and also Dr. [name] on February 08, 2010.  DISPOSITION:  John [name] has met maximum benefit from inpatient stays, now medically cleared for  discharge.     Zenia Resides, NP   ______________________________ Leslye Peer, MD    PB/MEDQ  D:  01/29/2010  T:  01/29/2010  Job:  [id number]  cc:   Veverly Fells. Excell Seltzer, MD Rosalyn Gess. Debby Bud, MD Ines Bloomer, M.D.  Electronically Signed by Zenia Resides NP on 02/08/2010 08:07:07 AM Electronically Signed by Levy Pupa MD on 03/03/2010 01:16:23 PM

## 2010-03-04 ENCOUNTER — Encounter: Payer: Self-pay | Admitting: Emergency Medicine

## 2010-03-04 ENCOUNTER — Ambulatory Visit
Admission: RE | Admit: 2010-03-04 | Discharge: 2010-03-04 | Payer: Self-pay | Source: Home / Self Care | Attending: Emergency Medicine | Admitting: Emergency Medicine

## 2010-03-08 ENCOUNTER — Encounter: Payer: Self-pay | Admitting: Internal Medicine

## 2010-03-08 LAB — CONVERTED CEMR LAB: Prothrombin Time: 43.4 s

## 2010-03-12 ENCOUNTER — Ambulatory Visit
Admission: RE | Admit: 2010-03-12 | Discharge: 2010-03-12 | Payer: Self-pay | Source: Home / Self Care | Attending: Adult Health | Admitting: Adult Health

## 2010-03-12 ENCOUNTER — Ambulatory Visit (INDEPENDENT_AMBULATORY_CARE_PROVIDER_SITE_OTHER)
Admission: RE | Admit: 2010-03-12 | Discharge: 2010-03-12 | Payer: Medicare Other | Source: Home / Self Care | Attending: Emergency Medicine | Admitting: Emergency Medicine

## 2010-03-12 DIAGNOSIS — R509 Fever, unspecified: Secondary | ICD-10-CM

## 2010-03-12 DIAGNOSIS — R05 Cough: Secondary | ICD-10-CM

## 2010-03-14 ENCOUNTER — Telehealth (INDEPENDENT_AMBULATORY_CARE_PROVIDER_SITE_OTHER): Payer: Self-pay | Admitting: *Deleted

## 2010-03-14 NOTE — Assessment & Plan Note (Signed)
Summary: include fluid retention//jrc   Copy to:  Dr. [name] Primary Provider/Referring Provider:  Debby Bud  CC:  OV - Increased sweling in feet and ankles since D/C from hospital on 11-07-09 - SOB about the same - PT due to [place] on 11-29-09  - Pt hasn't taken Spiriva in 1 week due to dry mouth and urine color orange.  History of Present Illness: 77 yo former smoker with COPD, St Jude's AVR for AI in 1994 (on warfarin). Referred for RLL nodule identified by CXR and CT scan of the chest in 4/11.  He tells me that he was told many yrs ago (as long ago as age 89!) that he has had some abnormality on CXR in the R lung.   Originally had flu-like signs in February/March, treated for PNA by an Urgent Care. Clinically improved with Pred + abx. Repeat CXR in 4/11 showed ? RLL nodule, which prompted the CT scan and the PET scan.  The PET on that showed uptake below the malignant range at 1.3, although there was motion degradation of the study. He was referred to Dr Edwyna Shell on May 18 for eval, also referred here to assess. He has also had spirometry done at Urgent care in [place] - per Dr Scheryl Darter notes: FEV1  0.97, FVC 1.81L. Now in a position where we need to decide regarding bx/resection vs watch and wait x 3 months with repeat imaging. he tells me that he is feeling well, no dyspnea, cough, hemoptysis.   ROV 07/31/09 -- He has seen Dr Edwyna Shell regarding most recent PFTs, shows FEV1 1.6L. CT scan was in 4/11, next one needs to be in August (3 months after PET). C/o severe exertional SOB.   ROV 09/18/09 -- follows up abnormal CT, RLL nodule. Hasn't started Spiriva yet, wanted to defer because he was feeling OK. Had repeat Ct scan today, shows enlargement of RLL nodule as below  ROV 10/03/09 -- follows up RLL nodule and hemoptysis. Since last visit, EMB confirmed NSCLCA. He has seen Dr Edwyna Shell and plan is for lobectomy. Made the transition from coumadin to enox and back to coumadin (last dose exox 8/23). Plan  will be to go back to enox on 8/31 in prep for lobectomy on 10/16/09. He has seen blood-streaked sputum since he was started on enox bridge, some dark, some BRB. It has decreased and has subsided last 24 hours.  11/26/09--Presents for a work in visit. Recently admitted for  RLL for lung cancer. (Recent FOB  which biopsies were positive for carcinoma in RLL nodule). He was admited on 9/6-9/28 for a complicated hospitalization. Underwent lobectomy on 9/6  w/ high grade output requiring transfusion of plat, frozen plasma.  He was taken back to OR on 10/17/09 for exploratory thoracotomy with evacuation of hematoma and control bleeding. He slowly weaned of vent 10/20/09.  After extubation he develped AFib w/ RVR  and was started on amiodarone.  Cards evaluation was done. Pt had persistent afib , and was cardioverted on 10/29/09 -which was unsucessful. He has resp distress on 9/20. requiring reintubation.  BAL showed Klebsiella. Tx w/ aggressive pulmoanry hygiene. Extubated on 11/02/09.  He was continued on coumadin therapy. Last INR was 6.9 coumadin was held and repeat INR was 1.7 last week.  He slowly improved and was discharged on 11/07/09. Since discharge he contnues to be weak. and wears out easily . Seen last week by Dr. Edwyna Shell CXR showed persistent Moderate hydropneumothorax on the right is unchanged. Mild improvement in  right perihilar airspace disease. Legs are more swollen, feels like he is retaining fluid. Denies chest pain,  orthopnea, hemoptysis, fever, n/v/d. O2 sat today are 89-92% on O2.    Current Medications (verified): 1)  Adult Aspirin Ec Low Strength 81 Mg  Tbec (Aspirin) .... Take 1 Tablet By Mouth Once A Day 2)  Metoprolol Tartrate 100 Mg Tabs (Metoprolol Tartrate) .... Take One Tablet By Mouth Twice A Day 3)  Coumadin 5 Mg  Tabs (Warfarin Sodium) .... Take As Directed By Coumadin Clinic. 4)  Acetaminophen .... As Needed 5)  Ventolin Hfa 108 (90 Base) Mcg/act Aers (Albuterol Sulfate) .... 2 Puffs  Q4h As Needed Sob 6)  Amiodarone Hcl 200 Mg Tabs (Amiodarone Hcl) .... Take One Tablet By Mouth Daily 7)  Ferrous Sulfate 325 (65 Fe) Mg Tabs (Ferrous Sulfate) .... Take One  By Mouth Two Times A Day 8)  Hydrocodone-Acetaminophen 5-325 Mg Tabs (Hydrocodone-Acetaminophen) .... Take One To Tow Tabs By Mouth Every 6 Hrs As Needed Pain 9)  Potassium Chloride Cr 10 Meq Cr-Caps (Potassium Chloride) .... Take One By Mouth Once Daily 10)  Flaxseed Oil 1000 Mg Caps (Flaxseed (Linseed)) .... Take One By Mouth Two Times A Day 11)  Pepcid 20 Mg Tabs (Famotidine) .... Take One By Mouth Once Daily As Needed 12)  Furosemide 40 Mg Tabs (Furosemide) .... Take One By Mouth Once Daily  Allergies (verified): 1)  ! Pcn  Comments:  Nurse/Medical Assistant: The patient's medications and allergies were reviewed with the patient and were updated in the Medication and Allergy Lists.  Past History:  Past Medical History: Last updated: 07-03-09 Current Problems:  AORTIC INSUFFICIENCY (ICD-424.1) CARDIOMYOPATHY (ICD-425.4) PERIPHERAL EDEMA (ICD-782.3)Chronic lower extremity edema. 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) UNS ADVRS EFF UNS RX MEDICINAL&BIOLOGICAL SBSTNC (ICD-995.20) UMBILICAL HERNIA (ICD-553.1) SEBACEOUS CYST, NECK (ICD-706.2) UNSPECIFIED LABYRINTHITIS (ICD-386.30) DYSPEPSIA, HX OF (ICD-V12.79) SPECIAL SCREENING MALIGNANT NEOPLASM OF PROSTATE (ICD-V76.44) HERPES ZOSTER, HX OF 1996 (ICD-V13.8)  Past Surgical History: Last updated: 03-Jul-2009 Aortic Valve Replacement: '94 - St Jude's  Appendectomy-youth repair of abdominal wall hernia  Family History: Last updated: Jul 03, 2009 father - deceased 27; colon cancer mother - deceased 60; aneurysm rupture - neck., CAD Neg - prostate  cancer, DM,   Social History: Last updated: 07-03-2009 married '70  1 daughter  '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  Review of Systems      See HPI  Vital Signs:  Patient profile:   77 year old male Weight:      177.50 pounds O2 Sat:      89 % on 4 L/min Temp:     98.8 degrees F oral Pulse rate:   64 / minute BP sitting:   120 / 60  (right arm) Cuff size:   regular  Vitals Entered By: Abigail Miyamoto RN (November 26, 2009 11:36 AM)  O2 Flow:  4 L/min  Physical Exam  Additional Exam:  GEN: A/Ox3; pleasant , NAD HEENT:  [place]/AT, , EACs-clear, TMs-wnl, NOSE-clear, THROAT-clear NECK:  Supple w/ fair ROM; no JVD; normal carotid impulses w/o bruits; no thyromegaly or nodules palpated; no lymphadenopathy. RESP  Coarse BS w/ no wheezing.  CARD:  RRR, no m/r/g   GI:   Soft & nt; nml bowel sounds; no organomegaly or masses detected. [place]co: Warm bil,  no calf tenderness edema, clubbing, pulses intact Neuro:intact w/ no focal deficits noted.    Impression &  Recommendations:  Problem # 1:  CARCINOMA, LUNG, NONSMALL CELL (ICD-162.9)  s/p RL lobectomy. CxR shows persistent hydropneumo. --no signifcant chagne cont follow up with Dr. Delton Coombes and Edwyna Shell.  The following medications were removed from the medication list:    Loratadine 10 Mg Tabs (Loratadine) .Marland Kitchen... Take 1 tablet by mouth once a day  Orders: Est. Patient Level IV (16109)  Problem # 2:  COPD (ICD-496) Flare with suspected volume overload.  Plan: labs pending.  Extra Lasix 40mg  once daily for 2 days  Low salt diet.  Hold flaxseed oil for now.  follow up Dr. Delton Coombes in 3 days  We will review labs on return this week.  Increase Oxygen 4 liters  Please contact office for sooner follow up if symptoms do not improve or worsen   Medications Added to Medication List This Visit: 1)  Ferrous Sulfate 325 (65 Fe) Mg Tabs (Ferrous sulfate) .... Take one  by mouth two times a day 2)  Potassium Chloride Cr 10 Meq Cr-caps (Potassium  chloride) .... Take one by mouth once daily 3)  Flaxseed Oil 1000 Mg Caps (Flaxseed (linseed)) .... Take one by mouth two times a day 4)  Pepcid 20 Mg Tabs (Famotidine) .... Take one by mouth once daily as needed 5)  Furosemide 40 Mg Tabs (Furosemide) .... Take one by mouth once daily 6)  Hydrocodone-acetaminophen 5-325 Mg Tabs (Hydrocodone-acetaminophen) .... Take one to tow tabs by mouth every 6 hrs as needed pain  Complete Medication List: 1)  Adult Aspirin Ec Low Strength 81 Mg Tbec (Aspirin) .... Take 1 tablet by mouth once a day 2)  Metoprolol Tartrate 100 Mg Tabs (Metoprolol tartrate) .... Take one tablet by mouth twice a day 3)  Coumadin 5 Mg Tabs (Warfarin sodium) .... Take as directed by coumadin clinic. 4)  Amiodarone Hcl 200 Mg Tabs (Amiodarone hcl) .... Take one tablet by mouth daily 5)  Ferrous Sulfate 325 (65 Fe) Mg Tabs (Ferrous sulfate) .... Take one  by mouth two times a day 6)  Potassium Chloride Cr 10 Meq Cr-caps (Potassium chloride) .... Take one by mouth once daily 7)  Flaxseed Oil 1000 Mg Caps (Flaxseed (linseed)) .... Take one by mouth two times a day 8)  Pepcid 20 Mg Tabs (Famotidine) .... Take one by mouth once daily as needed 9)  Furosemide 40 Mg Tabs (Furosemide) .... Take one by mouth once daily 10)  Ventolin Hfa 108 (90 Base) Mcg/act Aers (Albuterol sulfate) .... 2 puffs q4h as needed sob 11)  Hydrocodone-acetaminophen 5-325 Mg Tabs (Hydrocodone-acetaminophen) .... Take one to tow tabs by mouth every 6 hrs as needed pain  Other Orders: TLB-BMP (Basic Metabolic Panel-BMET) (80048-METABOL) TLB-BNP (B-Natriuretic Peptide) (83880-BNPR) TLB-PT (Protime) (85610-PTP)  Patient Instructions: 1)  Extra Lasix 40mg  once daily for 2 days  2)  Low salt diet.  3)  Hold flaxseed oil for now.  4)  follow up Dr. Delton Coombes in 3 days  5)  We will review labs on return this week.  6)  Increase Oxygen 4 liters  7)  Please contact office for sooner follow up if symptoms do not  improve or worsen

## 2010-03-14 NOTE — Medication Information (Signed)
Summary: rov/cb  Anticoagulant Therapy  Managed by: Weston Brass, PharmD Referring MD: [name] PCP: Link Snuffer MD: Shirlee Latch MD, Freida Busman Indication 1: Aortic Valve Replacement (ICD-V43.3) Indication 2: Aortic Valve Disorder (ICD-424.1) Lab Used: [facility] Site: [facility] INR POC 2.1 INR RANGE 2.5 - 3.5  Dietary changes: no    Health status changes: yes       Details: has a nodule in his right lung- seeing Dr. Edwyna Shell for consulation today; has quit smoking since nodule was found.   Bleeding/hemorrhagic complications: no    Recent/future hospitalizations: no    Any changes in medication regimen? no    Recent/future dental: no  Any missed doses?: no       Is patient compliant with meds? yes       Allergies: 1)  ! Pcn  Anticoagulation Management History:      The patient is taking warfarin and comes in today for a routine follow up visit.  Positive risk factors for bleeding include an age of 75 years or older.  The bleeding index is 'intermediate risk'.  Positive CHADS2 values include History of HTN.  Negative CHADS2 values include Age > 21 years old.  The start date was 09/05/1998.  Anticoagulation responsible provider: Shirlee Latch MD, [place].  INR POC: 2.1.  Cuvette Lot#: [id number].  Exp: 09/2010.    Anticoagulation Management Assessment/Plan:      The patient's current anticoagulation dose is Coumadin 5 mg  tabs: AS DIRECTED.  The target INR is 2.5-3.5.  The next INR is due 07/18/2009.  Anticoagulation instructions were given to patient.  Results were reviewed/authorized by Weston Brass, PharmD.  He was notified by Weston Brass PharmD.         Prior Anticoagulation Instructions: INR 2.5. Take 1 tablet daily except 0.5 tablet on Wednesdays.  Current Anticoagulation Instructions: INR 2.1  Take extra 1 tablet today then resume same dose of 1 tablet every day except 1/2 tablet on Wednesday.

## 2010-03-14 NOTE — Medication Information (Signed)
Summary: Coumadin Clinic  Anticoagulant Therapy  Managed by: Weston Brass, PharmD Referring MD: [name] PCP: Link Snuffer MD: Gala Romney MD, Reuel Boom Indication 1: Aortic Valve Replacement (ICD-V43.3) Indication 2: Aortic Valve Disorder (ICD-424.1) Lab Used: [facility] [place] Site: [address] INR POC 4.8 INR RANGE 2.5 - 3.5   Health status changes: yes       Details: pt pending cardioversion tomorrow  Bleeding/hemorrhagic complications: yes       Details: pt complains of nosebleed so HH RN checked INR   Recent/future hospitalizations: no    Any changes in medication regimen? no    Recent/future dental: no  Any missed doses?: no       Is patient compliant with meds? yes      Comments: Pt already taken Coumadin for today  Allergies: 1)  ! Pcn  Anticoagulation Management History:      His anticoagulation is being managed by telephone today.  Positive risk factors for bleeding include an age of 25 years or older.  The bleeding index is 'intermediate risk'.  Positive CHADS2 values include History of HTN.  Negative CHADS2 values include Age > 19 years old.  The start date was 09/05/1998.  His last INR was 1.7 ratio.  Anticoagulation responsible provider: Bensimhon MD, Reuel Boom.  INR POC: 4.8.  Exp: 01/2011.    Anticoagulation Management Assessment/Plan:      The patient's current anticoagulation dose is Coumadin 5 mg  tabs: Take as directed by coumadin clinic..  The target INR is 2.5-3.5.  The next INR is due 01/18/2010.  Anticoagulation instructions were given to patient/ [facility].  Results were reviewed/authorized by Weston Brass, PharmD.  He was notified by Weston Brass PharmD.         Prior Anticoagulation Instructions: INR 3.2 Continue previous dose of 0.5 tablet everyday except 1 tablet Tuesday, Thursday, and Saturday Recheck INR in 4 week    Current Anticoagulation Instructions: INR 4.8  Spoke with Kathie Rhodes with [facility].  Pt already taken Coumadin  today.  Take 1/2 tablet tomorrow and eat extra greens for the next few days.  Recheck INR in 1 week.

## 2010-03-14 NOTE — Miscellaneous (Signed)
Summary: [facility] Orders   [facility] Orders   Imported By: Roderic Ovens 01/29/2010 11:45:33  _____________________________________________________________________  External Attachment:    Type:   Image     Comment:   External Document

## 2010-03-14 NOTE — Medication Information (Signed)
Summary: rov/ewj  Anticoagulant Therapy  Managed by: Bethena Midget, RN, BSN Referring MD: [name] PCP: Link Snuffer MD: Myrtis Ser MD, Tinnie Gens Indication 1: Aortic Valve Replacement (ICD-V43.3) Indication 2: Aortic Valve Disorder (ICD-424.1) Lab Used: [facility] Site: [address] INR POC 4.3 INR RANGE 2.5 - 3.5  Dietary changes: no    Health status changes: yes       Details: Dx with Pnuemonia  Bleeding/hemorrhagic complications: no    Recent/future hospitalizations: no    Any changes in medication regimen? yes       Details: see below..  Recent/future dental: no  Any missed doses?: no       Is patient compliant with meds? yes      Comments: Took Factive 320mg  for 7 days, finished 04/08/09. Also took Prednisone 10mg s tapering dose pak finished regimen on 04/13/09. Also used Cheratussin AC syrup PRN  Allergies: 1)  ! Pcn  Anticoagulation Management History:      The patient is taking warfarin and comes in today for a routine follow up visit.  Positive risk factors for bleeding include an age of 75 years or older.  The bleeding index is 'intermediate risk'.  Positive CHADS2 values include History of HTN.  Negative CHADS2 values include Age > 75 years old.  The start date was 09/05/1998.  Anticoagulation responsible provider: Myrtis Ser MD, Tinnie Gens.  INR POC: 4.3.  Cuvette Lot#: [id number].  Exp: 06/2010.    Anticoagulation Management Assessment/Plan:      The patient's current anticoagulation dose is Coumadin 5 mg  tabs: AS DIRECTED.  The target INR is 2.5-3.5.  The next INR is due 05/01/2009.  Anticoagulation instructions were given to patient.  Results were reviewed/authorized by Bethena Midget, RN, BSN.  He was notified by Bethena Midget, RN, BSN.         Prior Anticoagulation Instructions: INR 3.8  Start taking 1 tablet daily except 1/2 tablet on Wednesdays.  Recheck in 3-4 weeks.    Current Anticoagulation Instructions: INR 4.3 Skip Wednesday's dose then resume 5mg s daily  except 2.5mg s on Wednesdays. Recheck in 2 weeks.

## 2010-03-14 NOTE — Medication Information (Addendum)
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 yes       Details: Tomorrow Prednisone decreases to 20mg s from 30mg s  Recent/future dental: no  Any missed doses?: no       Is patient compliant with meds? yes      Comments: Sees Pulm Mon.   Allergies: 1)  ! Pcn  Anticoagulation Management History:      His anticoagulation is being managed by telephone today.  Positive risk factors for bleeding include an age of 75 years or older.  The bleeding index is 'intermediate risk'.  Positive CHADS2 values include History of HTN.  Negative CHADS2 values include Age > 75 years old.  The start date was 09/05/1998.  His last INR was 5.9.  Prothrombin time is 38.9.  Anticoagulation responsible provider: Shirlee Latch MD, Yang Rack.  INR POC: 3.2.    Anticoagulation Management Assessment/Plan:      The patient's current anticoagulation dose is Coumadin 5 mg  tabs: Take as directed by coumadin clinic..  The target INR is 2.5-3.5.  The next INR is due 03/08/2010.  Anticoagulation instructions were given to home health nurse.  Results were reviewed/authorized by Bethena Midget, RN, BSN.  He was notified by Bethena Midget, RN, BSN.         Prior Anticoagulation Instructions: INR 5.3  Spoke with Kathie Rhodes, [facility] while at pt's home. Advised to Skip today's and tomorrow's dosage of Coumadin, then start taking 2.5mg  daily.  Recheck in 1 week.  Current Anticoagulation Instructions: INR 3.2 Continue 2.5mg s  daily. Recheck in one week. Orders given to [facility] with [facility] while in home with Pt.

## 2010-03-14 NOTE — Medication Information (Signed)
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 no    Recent/future dental: no  Any missed doses?: no       Is patient compliant with meds? yes       Allergies: 1)  ! Pcn  Anticoagulation Management History:      The patient is taking warfarin and comes in today for a routine follow up visit.  Positive risk factors for bleeding include an age of 75 years or older.  The bleeding index is 'intermediate risk'.  Positive CHADS2 values include History of HTN.  Negative CHADS2 values include Age > 75 years old.  The start date was 09/05/1998.  Anticoagulation responsible provider: Shirlee Latch MD, Rihaan Barrack.  INR POC: 2.5.  Cuvette Lot#: [id number].  Exp: 07/2010.    Anticoagulation Management Assessment/Plan:      The patient's current anticoagulation dose is Coumadin 5 mg  tabs: AS DIRECTED.  The target INR is 2.5-3.5.  The next INR is due 06/27/2009.  Anticoagulation instructions were given to patient.  Results were reviewed/authorized by Elaina Pattee, PharmD.  He was notified by Elaina Pattee, PharmD.         Prior Anticoagulation Instructions: INR 3.1  Continue on same dosage 1 tablet daily except 1/2 tablet on Wednesdays.  Recheck in 4 weeks.    Current Anticoagulation Instructions: INR 2.5. Take 1 tablet daily except 0.5 tablet on Wednesdays. Prescriptions: COUMADIN 5 MG  TABS (WARFARIN SODIUM) AS DIRECTED  #40 x 3   Entered by:   Elaina Pattee   Authorized by:   Norva Karvonen, MD   Signed by:   Elaina Pattee on 05/30/2009   Method  used:   Electronically to        [facility] (retail)       [address]       [place], [place]  [address]       Ph: [phone] or [phone]       Fax: [phone]   RxID:   [id number]

## 2010-03-14 NOTE — Assessment & Plan Note (Signed)
Summary: COPD, NSCLCA, amio toxicity   Visit Type:  Follow-up Copy to:  Dr. [name] Primary Provider/Referring Provider:  Debby Bud  CC:  Pneumonia follow-up.Marland KitchenMarland KitchenCXR done on 02/06/2010.  No complaints today.  Pt has decreased oxygen to @L  pulsing with exertion and at rest..  History of Present Illness: 77 yo former smoker with COPD, St Jude's AVR for AI in 1994 (on warfarin). RLL nodule dx as NSCLCA in 09/2009 s/p lobectomy Dr Edwyna Shell. Course c/b hydroPTX/hemothorax.   Admitted 12/11 for acute resp failure, infiltrates. 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 Diuresed, treated with steroids. His breathing is much better since discharge. Otilio Carpen gets epistaxis. No longer doing vibration vest. Currently on Pred 20mg  once daily. Inhaled meds include Spiriva, Symbicort. Wearing 2L/min. exertional tolerance is good.   Current Medications (verified): 1)  Adult Aspirin Ec Low Strength 81 Mg  Tbec (Aspirin) .... Take 1 Tablet By Mouth Once A Day 2)  Metoprolol Tartrate 100 Mg Tabs (Metoprolol Tartrate) .... Take 1/2 Tablet By Mouth Twice A Day 3)  Coumadin 5 Mg  Tabs (Warfarin Sodium) .... Take As Directed By Coumadin Clinic. 4)  Ferrous Sulfate 325 (65 Fe) Mg Tabs (Ferrous Sulfate) .... Take One  By Mouth Two Times A Day 5)  Potassium Chloride Crys Cr 20 Meq Cr-Tabs (Potassium Chloride Crys Cr) .... Take One Tablet By Mouth Daily 6)  Pepcid 20 Mg Tabs (Famotidine) .... Take One By Mouth Once Daily As Needed 7)  Furosemide 40 Mg Tabs (Furosemide) .... Take One By Mouth Once Two Times A Day. 8)  Ventolin Hfa 108 (90 Base) Mcg/act Aers (Albuterol Sulfate) .... 2 Puffs Q4h As Needed Sob 9)  Ensure  Liqd (Nutritional Supplements) .... Three Times A Day As Needed 10)  Oxygen .... Wear 3.5l/min To 4l/min Continuously 11)  Fluticasone Propionate 50 Mcg/act Susp (Fluticasone Propionate) .... 2 Sprays Each Nostril Two Times A Day 12)  Spiriva Handihaler 18 Mcg Caps (Tiotropium Bromide  Monohydrate) .... Inhale Contents of 1 Capsule Once A Day 13)  Symbicort 80-4.5 Mcg/act Aero (Budesonide-Formoterol Fumarate) .... Inhale 2 Puffs Two Times A Day 14)  Mucinex 600 Mg Xr12h-Tab (Guaifenesin) .... Take 1-2 Tablets Every 12 Hours As Needed 15)  Prednisone 20 Mg Tabs (Prednisone) .Marland Kitchen.. 1 By Mouth Daily  Allergies (verified): 1)  ! Pcn  Vital Signs:  Patient profile:   77 year old male Height:      72 inches (182.88 cm) Weight:      179.13 pounds (81.42 kg) BMI:     24.38 O2 Sat:      92 % on 2 L/min pulsing Temp:     98.3 degrees F (36.83 degrees C) oral Pulse rate:   76 / minute BP sitting:   132 / 74  (left arm) Cuff size:   regular  Vitals Entered By: Michel Bickers CMA (March 04, 2010 3:15 PM)  O2 Sat at Rest %:  92 O2 Flow:  2 L/min pulsing CC: Pneumonia follow-up.Marland KitchenMarland KitchenCXR done on 02/06/2010.  No complaints today.  Pt has decreased oxygen to @L  pulsing with exertion and at rest. Comments Medications reviewed with patient Michel Bickers CMA  March 04, 2010 3:16 PM   Physical Exam  General:  normal appearance and healthy appearing.   Head:  normocephalic and atraumatic Nose:  no deformity, discharge, inflammation, or lesions Mouth:  no deformity or lesions Neck:  no masses, thyromegaly, or abnormal cervical nodes Lungs:  clear bilaterally to auscultation and percussion Heart:  regular rhythm and  normal rate.  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   Problem # 4:  ATRIAL FIBRILLATION (ICD-427.31) - following w Dr Excell Seltzer - no more amiodarone - coumadin  His updated medication list for this problem includes:    Adult Aspirin Ec Low Strength 81 Mg Tbec (Aspirin) .Marland Kitchen... Take 1 tablet by mouth once a day    Metoprolol Tartrate 100 Mg Tabs (Metoprolol tartrate) .Marland Kitchen... Take 1/2 tablet by mouth twice a day    Coumadin 5 Mg Tabs (Warfarin sodium) .Marland Kitchen... Take as directed by coumadin clinic.  Medications Added to Medication List This Visit: 1)  Prednisone 20 Mg Tabs (Prednisone) .Marland Kitchen.. 1 by mouth daily 2)  Prednisone 10 Mg Tabs (Prednisone) .Marland Kitchen.. 1 by mouth once daily x 1 week  Patient Instructions: 1)  Continue your Oxygen 2)  Continue your inhaled medications as you are taking them 3)  Finish prednisone 20mg  once daily through saturday, then decrease to 10mg  once daily for a week, then stop 4)  Stay on coumadin, metoprolol as managed by Dr Excell Seltzer 5)  Follow with Dr Edwyna Shell as planned 6)  We recheck your CXR when you are off the prednisone 7)  Follow up with Dr Delton Coombes in 1 month with a CXR Prescriptions: PREDNISONE 10 MG TABS (PREDNISONE) 1 by mouth once daily x 1 week  #7 x 0   Entered and Authorized by:   Leslye Peer MD   Signed by:   Leslye Peer MD on 03/04/2010   Method used:   Electronically to        [facility] (retail)       [address]       [place], [place]  [address]       Ph: [phone] or [phone]       Fax: [phone]   RxID:   [id number]

## 2010-03-14 NOTE — Assessment & Plan Note (Signed)
Summary: RLL nodule   Visit Type:  Initial Consult Copy to:  Dr. [name] Primary Provider/Referring Provider:  Debby Bud  CC:  Pulmonary consult. Recent PET and was seen by Dr. Edwyna Shell on May 18th. .  History of Present Illness: 77 yo former smoker with COPD, St Jude's AVR for AI in 1994 (on warfarin). Referred for RLL nodule identified by CXR and CT scan of the chest in 4/11.  He tells me that he was told many yrs ago (as long ago as age 47!) that he has had some abnormality on CXR in the R lung.   Originally had flu-like signs in February/March, treated for PNA by an Urgent Care. Clinically improved with Pred + abx. Repeat CXR in 4/11 showed ? RLL nodule, which prompted the CT scan and the PET scan.  The PET on that showed uptake below the malignant range at 1.3, although there was motion degradation of the study. He was referred to Dr Edwyna Shell on May 18 for eval, also referred here to assess. He has also had spirometry done at Urgent care in [place] - per Dr Scheryl Darter notes: FEV1  0.97, FVC 1.81L. Now in a position where we need to decide regarding bx/resection vs watch and wait x 3 months with repeat imaging. he tells me that he is feeling well, no dyspnea, cough, hemoptysis.   Preventive Screening-Counseling & Management  Alcohol-Tobacco     Alcohol drinks/day: 0     Smoking Status: quit < 6 months     Year Quit: March 2011     Pack years: 25-30 years x1 ppd  Current Medications (verified): 1)  Adult Aspirin Ec Low Strength 81 Mg  Tbec (Aspirin) .... Take 1 Tablet By Mouth Once A Day 2)  Enalapril Maleate 20 Mg  Tabs (Enalapril Maleate) .... Take 1 Tablet By Mouth Two Times A Day 3)  Metoprolol Tartrate 100 Mg Tabs (Metoprolol Tartrate) .... Take One Tablet By Mouth Twice A Day 4)  Coumadin 5 Mg  Tabs (Warfarin Sodium) .... As Directed 5)  Amlodipine Besylate 10 Mg Tabs (Amlodipine Besylate) .... Take One Tablet By Mouth Daily 6)  Meclizine Hcl 25 Mg Tabs (Meclizine Hcl) .... As  Needed 7)  Acetaminophen .... As Needed  Allergies (verified): 1)  ! Pcn  Past History:  Past Medical History: Reviewed history from 06/15/2009 and no changes required. Current Problems:  AORTIC INSUFFICIENCY (ICD-424.1) CARDIOMYOPATHY (ICD-425.4) PERIPHERAL EDEMA (ICD-782.3)Chronic lower extremity edema. 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 Aortic Valve Replacement: '94 - St Jude's  Appendectomy-youth repair of abdominal wall hernia  Family History: Reviewed history from 06/15/2009 and no changes required. father - deceased 87; colon cancer mother - deceased 41; aneurysm rupture - neck., CAD Neg - prostate cancer, DM,   Social History: Reviewed history from 06/15/2009 and no changes required. married '70  1 daughter  '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 Alcohol drinks/day:  0 Smoking Status:  quit < 6 months Pack years:  25-30 years x1 ppd  Review of Systems  The patient denies shortness of breath with activity, shortness of breath at rest, productive cough, non-productive cough, coughing  up blood, chest pain, irregular heartbeats, acid heartburn, indigestion, loss of appetite, weight change, abdominal pain, difficulty swallowing, sore throat, tooth/dental problems, headaches, nasal congestion/difficulty breathing through nose, sneezing, itching, ear ache, anxiety, depression, hand/feet swelling,  joint stiffness or pain, rash, change in color of mucus, and fever.    Vital Signs:  Patient profile:   77 year old male Height:      72 inches (182.88 cm) Weight:      180 pounds (81.82 kg) BMI:     24.50 O2 Sat:      95 % on Room air Temp:     97.7 degrees F (36.50 degrees C) oral Pulse rate:   64 / minute BP sitting:   112 / 78  (left arm) Cuff size:   regular  Vitals Entered By: Michel Bickers CMA (Jul 05, 2009 9:56 AM)  O2 Sat at Rest %:  95 O2 Flow:  Room air CC: Pulmonary consult. Recent PET and was seen by Dr. Edwyna Shell on May 18th.  Comments Medications reviewed. Daytime phone verified. Michel Bickers CMA  Jul 05, 2009 10:07 AM   Physical Exam  General:  normal appearance and healthy appearing.   Head:  normocephalic and atraumatic Nose:  no deformity, discharge, inflammation, or lesions Mouth:  no deformity or lesions Neck:  no masses, thyromegaly, or abnormal cervical nodes Lungs:  clear bilaterally to auscultation and percussion Heart:  regular rhythm and normal rate.  3/6 systolic M Abdomen:  not examined Msk:  no deformity or scoliosis noted with normal posture Extremities:  no edema Neurologic:  non-focal Skin:  intact without lesions or rashes Psych:  alert and cooperative; normal mood and affect; normal attention span and concentration   Impression & Recommendations:  Problem # 1:  PULMONARY NODULE (ICD-518.89)  - will obtain the film jackets from [facility] and [facility] to compare - full PFTs to better quantify COPD, especially if we want to recommend segmentectomy with Dr Edwyna Shell - could certainly consider repeat CT scan or PET in 3 months, no intervention now - I'll discuss risks/benefits of segmentectomy vs watchful waiting with Dr Edwyna Shell and pt once I get these data - ROV next available to review, he has f/u w Dr Edwyna Shell on June 1  Orders: Consultation Level IV [phone]) Pulmonary Referral (Pulmonary) Misc. Referral (Misc. Ref)  Patient  Instructions: 1)  We will perform full PFT's 2)  We will obtain ALL CXR's and CT scans from either the [facility] system or [facility] to review and compare. 3)  Dr Delton Coombes will discuss your case and your films with Dr Edwyna Shell to decide the next steps in this evaluation.  4)  Follow with Dr Delton Coombes at the next available appointment.

## 2010-03-14 NOTE — Medication Information (Signed)
Summary: rov/ewj  Anticoagulant Therapy  Managed by: Weston Brass, PharmD Referring MD: [name] PCP: Link Snuffer MD: Clifton James MD, Cristal Deer Indication 1: Aortic Valve Replacement (ICD-V43.3) Indication 2: Aortic Valve Disorder (ICD-424.1) Lab Used: [facility] Site: [facility] INR POC 2.8 INR RANGE 2.5 - 3.5  Dietary changes: no    Health status changes: no    Bleeding/hemorrhagic complications: no    Recent/future hospitalizations: no    Any changes in medication regimen? no    Recent/future dental: no  Any missed doses?: no       Is patient compliant with meds? yes       Allergies: 1)  ! Pcn  Anticoagulation Management History:      The patient is taking warfarin and comes in today for a routine follow up visit.  Positive risk factors for bleeding include an age of 75 years or older.  The bleeding index is 'intermediate risk'.  Positive CHADS2 values include History of HTN.  Negative CHADS2 values include Age > 75 years old.  The start date was 09/05/1998.  Anticoagulation responsible provider: Clifton James MD, Cristal Deer.  INR POC: 2.8.  Cuvette Lot#: [id number].  Exp: 11/2010.    Anticoagulation Management Assessment/Plan:      The patient's current anticoagulation dose is Coumadin 5 mg  tabs: AS DIRECTED.  The target INR is 2.5-3.5.  The next INR is due 10/16/2009.  Anticoagulation instructions were given to patient.  Results were reviewed/authorized by Weston Brass, PharmD.  He was notified by Gweneth Fritter, PharmD Candidate.         Prior Anticoagulation Instructions: INR 3.0  Continue on same dosage 1 tablet daily except 1/2 tablet on Wednesdays.  Recheck in 4 weeks.   Current Anticoagulation Instructions: INR 2.8  Continue taking 1 tablet (5mg ) every day except take 1/2 tablet (2.5mg ) on Wednesday.  Recheck in 4 weeks.

## 2010-03-14 NOTE — Progress Notes (Signed)
Summary: low o2 levels  Phone Note Call from Patient Call back at Home Phone [phone]   Caller: [name]--[phone] Call For: [name] Reason for Call: Talk to Nurse Summary of Call: [name] calling to inform us patient is on 4L o2 and sats at rest are 79%.  Patient is having a hard time getting full breath and keeping oxygen level up.  She is there for physical therpy after lung surgery. [name] is at patient's home now. Initial call taken by: Lehman Prom,  January 22, 2010 2:43 PM  Follow-up for Phone Call        Spoke with [name].  She states that this afternoon she went to visit pt and sats were at 79%4lpm resting.  She states that if he sits still long enough he can get sats up to the upper 80's.  He is also "spitting up more blood"- she states that this is most likely coming from his sinuses as he has had this issue in the past.  She states that pt is not in acute distress, but needs to be evaluated soon.  I called and spke with his spouse.  She states that his breathing is worse since had cardioversion done approx 1 wk ago.  I sched him to see TP tommorrow am and advised that she take him to nearest ER asap should his symptoms persist, worsen.  Spouse verbalized understanding. Follow-up by: Vernie Murders,  January 22, 2010 3:27 PM

## 2010-03-14 NOTE — Assessment & Plan Note (Signed)
Summary: HOSPTIAL ADMIT    Copy to:  Dr. [name] Primary Provider/Referring Provider:  Debby Bud  CC:  DOE, wheezing at night, prod cough with blood-tinged and clear mucus, and tightness in chest x2weeks.  states this began w/ his cardioversion.  History of Present Illness: HOSPITAL ADMIT--KEEP IN FRONT OF MD PROGRESS NOTE SECTION   77 yo former smoker with COPD, [employer] AVR for AI in 1994 (on warfarin). Referred for RLL nodule identified by CXR and CT scan of the chest in 4/11.  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   11/26/09--Presents for a work in visit. Recently admitted for  RLL for lung cancer. (Recent FOB  which biopsies were positive for carcinoma in RLL nodule). He was admited on 9/6-9/28 for a complicated hospitalization. Underwent lobectomy on 9/6  w/ high grade output requiring transfusion of plat, frozen plasma.  He was taken back to OR on 10/17/09 for exploratory thoracotomy with evacuation of hematoma and control bleeding. He slowly weaned of vent 10/20/09.  After extubation he develped AFib w/ RVR  and was started on amiodarone.  Cards evaluation was done. Pt had persistent afib , and was cardioverted on 10/29/09 -which was unsucessful. He has resp distress on 9/20. requiring reintubation.  BAL showed Klebsiella. Tx w/ aggressive pulmoanry hygiene. Extubated on 11/02/09.  He was continued on coumadin therapy. Last INR was 6.9 coumadin was held and repeat INR was 1.7 last week.  He slowly improved and was discharged on 11/07/09. Since discharge he contnues to be weak. and wears out easily . Seen last week by Dr. Edwyna Shell CXR showed persistent Moderate hydropneumothorax on the right is unchanged. Mild improvement in right perihilar airspace disease. Legs are more swollen, feels like he is retaining fluid. Denies chest pain,  orthopnea, hemoptysis, fever, n/v/d. O2 sat today are 89-92% on O2.   ROV 11/29/09 -- follows up NSCLCA s/p RLL  lobectomy. Complicated post-op course, hemothorax; left hosp with hydroPTX, on O2. On coumadin for AVR. Seen by TP 3d ago weak, dyspneic, edematous. Took doubled lasix to 80mg  once daily x 2 days. Due to have INR next Tuesday. Feels that his breathing is good at rest, difficult w exercise. Wearing O2 at 4L/min. Having periods of tachycardia.    ROV 01/08/10 -- f/u visit COPD, NSCLCA s/p lobectomy. Also A Fib + RVR following w Dr Excell Seltzer. Wearing O2 at 4L/min, doing home PT/rehab. He does still have desats to 80's with exertion. Planning to undergo cardioversion Thursday  January 23, 2010 -- Presents for an acute office visit. Complains of DOE, wheezing at night, prod cough with blood-tinged and clear mucus, tightness in chest x2weeks.  states this began w/ his cardioversion. Pt complains that  ~2 weeks his breathing changed dramatically. Has been undergoing PT at home w/ improved strength and activity tolerance. Underwent elective cardioversion on 01/10/10 and has remained in NSR. However his breathing has declined w/ dyspnea w/ minimal acitivity unable to participate in PT. O2 sat dropping into low 70s w/minimal activity. Today on arrival pt w/ Osat 75% on 3 l/m . He was increased to 4l/m w/ sats averging 85-90%. after several minutes. Over last 2 weeks has had specs of blood in mucus. Mucus occasionally green at times.. No increaesd edema . no chest pain. He is on coumadin , recent INR was 3.2. Denies chest pain, orthopnea,  fever, n/v/d, edema, headache.  Medications Prior to Update: 1)  Adult Aspirin Ec Low  Strength 81 Mg  Tbec (Aspirin) .... Take 1 Tablet By Mouth Once A Day 2)  Metoprolol Tartrate 100 Mg Tabs (Metoprolol Tartrate) .... Take One Tablet By Mouth Twice A Day 3)  Coumadin 5 Mg  Tabs (Warfarin Sodium) .... Take As Directed By Coumadin Clinic. 4)  Amiodarone Hcl 200 Mg Tabs (Amiodarone Hcl) .... Take One Tablet By Mouth Daily 5)  Ferrous Sulfate 325 (65 Fe) Mg Tabs (Ferrous Sulfate) ....  Take One  By Mouth Two Times A Day 6)  Potassium Chloride Crys Cr 20 Meq Cr-Tabs (Potassium Chloride Crys Cr) .... Take One Tablet By Mouth Daily 7)  Pepcid 20 Mg Tabs (Famotidine) .... Take One By Mouth Once Daily As Needed 8)  Furosemide 40 Mg Tabs (Furosemide) .... Take One By Mouth Once Two Times A Day. 9)  Ventolin Hfa 108 (90 Base) Mcg/act Aers (Albuterol Sulfate) .... 2 Puffs Q4h As Needed Sob 10)  Ensure  Liqd (Nutritional Supplements) .... Three Times A Day 11)  Oxigen 3 Litters 12)  Fluticasone Propionate 50 Mcg/act Susp (Fluticasone Propionate) .... 2 Sprays Each Nostril Two Times A Day  Current Medications (verified): 1)  Adult Aspirin Ec Low Strength 81 Mg  Tbec (Aspirin) .... Take 1 Tablet By Mouth Once A Day 2)  Metoprolol Tartrate 100 Mg Tabs (Metoprolol Tartrate) .... Take 1/2 Tablet By Mouth Twice A Day 3)  Coumadin 5 Mg  Tabs (Warfarin Sodium) .... Take As Directed By Coumadin Clinic. 4)  Amiodarone Hcl 200 Mg Tabs (Amiodarone Hcl) .... Take One Tablet By Mouth Daily 5)  Ferrous Sulfate 325 (65 Fe) Mg Tabs (Ferrous Sulfate) .... Take One  By Mouth Two Times A Day 6)  Potassium Chloride Crys Cr 20 Meq Cr-Tabs (Potassium Chloride Crys Cr) .... Take One Tablet By Mouth Daily 7)  Pepcid 20 Mg Tabs (Famotidine) .... Take One By Mouth Once Daily As Needed 8)  Furosemide 40 Mg Tabs (Furosemide) .... Take One By Mouth Once Two Times A Day. 9)  Ventolin Hfa 108 (90 Base) Mcg/act Aers (Albuterol Sulfate) .... 2 Puffs Q4h As Needed Sob 10)  Ensure  Liqd (Nutritional Supplements) .... Three Times A Day 11)  Oxygen .... Wear 3.5l/min To 4l/min Continuously 12)  Fluticasone Propionate 50 Mcg/act Susp (Fluticasone Propionate) .... 2 Sprays Each Nostril Two Times A Day 13)  Spiriva Handihaler 18 Mcg Caps (Tiotropium Bromide Monohydrate) .... Inhale Contents of 1 Capsule Once A Day  Allergies (verified): 1)  ! Pcn  Past History:  Past Medical History: Last updated: 01/01/2010 Current  Problems:  ATRIAL FIBRILLATION (ICD-427.31) AORTIC INSUFFICIENCY (ICD-424.1) CARDIOMYOPATHY (ICD-425.4) PERIPHERAL EDEMA (ICD-782.3)Chronic lower extremity edema. 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) UNS ADVRS EFF UNS RX MEDICINAL&BIOLOGICAL SBSTNC (ICD-995.20) UMBILICAL HERNIA (ICD-553.1) SEBACEOUS CYST, NECK (ICD-706.2) UNSPECIFIED LABYRINTHITIS (ICD-386.30) DYSPEPSIA, HX OF (ICD-V12.79) SPECIAL SCREENING MALIGNANT NEOPLASM OF PROSTATE (ICD-V76.44) HERPES ZOSTER, HX OF 1996 (ICD-V13.8) BRONCHOGENIC CARCINOMA S/P LOBECTOMY 2011  Past Surgical History: Last updated: Jun 19, 2009 Aortic Valve Replacement: '94 - [employer] Jude's  Appendectomy-youth repair of abdominal wall hernia  Family History: Last updated: 19-Jun-2009 father - deceased 81; colon cancer mother - deceased 70; aneurysm rupture - neck., CAD Neg - prostate cancer, DM,   Social History: Last updated: Jun 19, 2009 married '70  1 daughter  '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  Risk Factors: Smoking Status: quit < 6 months (07/05/2009) Packs/Day: .5 (03/09/2007)  Review of Systems  See HPI  The patient denies anorexia, fever, weight loss, weight gain, vision loss, decreased hearing, hoarseness, chest pain, syncope, headaches, abdominal pain, melena, hematochezia, severe indigestion/heartburn, hematuria, incontinence, muscle weakness, suspicious skin lesions, difficulty walking, depression, unusual weight change, abnormal bleeding, enlarged lymph nodes, and angioedema.    Vital Signs:  Patient profile:   77 year old male Height:      72 inches Weight:      175 pounds BMI:     23.82 Temp:     97.8 degrees F oral Pulse rate:   63 / minute BP sitting:   142 / 82  (left arm) Cuff size:   regular  Vitals Entered  By: Boone Master CNA/MA (January 23, 2010 10:32 AM)  O2 Flow:  4 L/min cont CC: DOE, wheezing at night, prod cough with blood-tinged and clear mucus, tightness in chest x2weeks.  states this began w/ his cardioversion Is Patient Diabetic? No Comments Medications reviewed with patient Daytime contact number verified with patient. Boone Master CNA/MA  January 23, 2010 10:34 AM    Physical Exam  Additional Exam:  GEN: A/Ox3; pleasant , NAD, chronically ill appearing.  HEENT:  [place]/AT, , EACs-clear, TMs-wnl, NOSE-clear, THROAT-clear NECK:  Supple w/ fair ROM; no JVD; normal carotid impulses w/o bruits; no thyromegaly or nodules palpated; no lymphadenopathy. RESP  Coarse BS w/ no wheezing.  CARD:  RRR, no m/r/g , tr edema  GI:   Soft & nt; nml bowel sounds; no organomegaly or masses detected. Musco: Warm bil,  no calf tenderness  clubbing, pulses intact Neuro:intact w/ no focal deficits noted.    Impression & Recommendations:  Problem # 1:  COPD (ICD-496) Acute on chronic hypoxic resp failure w/ significant decline in pulmonary status ? etiology appears to have started after cardioversion ? correlation , he is in NSR and is therapeutic  May have COPD flare w/ cough/green mucus /bld tinged.  will hold asa for now. cont coumadin -follow check xray and labs closely.  check bnp , card. enzymes , cont diuretics , may need additional-look at xray /bnp.  start empiric abx-avelox. and steroids-may be able to wean quickly.  he is on amiodarone- check esr and xray   Medications Added to Medication List This Visit: 1)  Metoprolol Tartrate 100 Mg Tabs (Metoprolol tartrate) .... Take 1/2 tablet by mouth twice a day 2)  Oxygen  .... Wear 3.5l/min to 4l/min continuously 3)  Spiriva Handihaler 18 Mcg Caps (Tiotropium bromide monohydrate) .... Inhale contents of 1 capsule once a day  Complete Medication List: 1)  Adult Aspirin Ec Low Strength 81 Mg Tbec (Aspirin) .... Take 1 tablet by mouth once a  day 2)  Metoprolol Tartrate 100 Mg Tabs (Metoprolol tartrate) .... Take 1/2 tablet by mouth twice a day 3)  Coumadin 5 Mg Tabs (Warfarin sodium) .... Take as directed by coumadin clinic. 4)  Amiodarone Hcl 200 Mg Tabs (Amiodarone hcl) .... Take one tablet by mouth daily 5)  Ferrous Sulfate 325 (65 Fe) Mg Tabs (Ferrous sulfate) .... Take one  by mouth two times a day 6)  Potassium Chloride Crys Cr 20 Meq Cr-tabs (Potassium chloride crys cr) .... Take one tablet by mouth daily 7)  Pepcid 20 Mg Tabs (Famotidine) .... Take one by mouth once daily as needed 8)  Furosemide 40 Mg Tabs (Furosemide) .... Take one by mouth once two times a day. 9)  Ventolin Hfa 108 (90 Base) Mcg/act Aers (Albuterol sulfate) .... 2 puffs q4h as needed sob  10)  Ensure Liqd (Nutritional supplements) .... Three times a day 11)  Oxygen  .... Wear 3.5l/min to 4l/min continuously 12)  Fluticasone Propionate 50 Mcg/act Susp (Fluticasone propionate) .... 2 sprays each nostril two times a day 13)  Spiriva Handihaler 18 Mcg Caps (Tiotropium bromide monohydrate) .... Inhale contents of 1 capsule once a day  Other Orders: No Charge Patient Arrived (NCPA0) (NCPA0)

## 2010-03-14 NOTE — Medication Information (Signed)
Summary: 1-2 week coumadin ck/per chris/mt  Anticoagulant Therapy  Managed by: Bethena Midget, RN, BSN Referring MD: [name] PCP: Link Snuffer MD: Clifton James MD, Cristal Deer Indication 1: Aortic Valve Replacement (ICD-V43.3) Indication 2: Aortic Valve Disorder (ICD-424.1) Lab Used: [facility] [place] Site: [address] INR POC 4.2 INR RANGE 2.5 - 3.5  Dietary changes: no    Health status changes: no    Bleeding/hemorrhagic complications: yes       Details: Occ. nosebleeding- scant amt  Recent/future hospitalizations: yes       Details: 12/14-12/20 [facility]   Any changes in medication regimen? yes       Details: Still on Prednisone since 12/12 40mg s daily.  Amiodarone was discontinued during hospital stay.   Recent/future dental: no  Any missed doses?: no       Is patient compliant with meds? yes       Allergies: 1)  ! Pcn  Anticoagulation Management History:      The patient is taking warfarin and comes in today for a routine follow up visit.  Positive risk factors for bleeding include an age of 75 years or older.  The bleeding index is 'intermediate risk'.  Positive CHADS2 values include History of HTN.  Negative CHADS2 values include Age > 50 years old.  The start date was 09/05/1998.  His last INR was 1.7 ratio.  Anticoagulation responsible provider: Clifton James MD, Cristal Deer.  INR POC: 4.2.  Cuvette Lot#: [id number].  Exp: 03/2011.    Anticoagulation Management Assessment/Plan:      The patient's current anticoagulation dose is Coumadin 5 mg  tabs: Take as directed by coumadin clinic..  The target INR is 2.5-3.5.  The next INR is due 02/15/2010.  Anticoagulation instructions were given to patient/ [facility].  Results were reviewed/authorized by Bethena Midget, RN, BSN.  He was notified by Bethena Midget, RN, BSN.         Prior Anticoagulation Instructions: INR 5.1  Spoke with Kathie Rhodes with [facility] while in pt's home.  Skip today's dose of Coumadin then resume  same dose of 1/2 tablet every day except 1 tablet on Tuesday, Thursday and Saturday.  Eat extra greens today and tomorrow.  Recheck INR on Friday in office.     Current Anticoagulation Instructions: INR 4.2 Skip today then resume 2.5mg s daily except 5mg  on Tuesdays, Thursdays and Saturdays. Recheck in 1 week.   Appended Document: 1-2 week coumadin ck/per chris/mt Orders given to [facility] with [facility]

## 2010-03-14 NOTE — Assessment & Plan Note (Signed)
Summary: NSCLCA, COPD, hemoptysis   Visit Type:  Follow-up Copy to:  Dr. [name] Primary Provider/Referring Provider:  Debby Bud  CC:  Patient c/o blood streaked mucus 2-3 days after biopsy and after starting Lovenox injections. Blood was mixture of bright red and dark(old) blood. No increased sob.Marland Kitchen  History of Present Illness: 77 yo former smoker with COPD, [employer] Jude's AVR for AI in 1994 (on warfarin). Referred for RLL nodule identified by CXR and CT scan of the chest in 4/11.  He tells me that he was told many yrs ago (as long ago as age 42!) that he has had some abnormality on CXR in the R lung.   Originally had flu-like signs in February/March, treated for PNA by an Urgent Care. Clinically improved with Pred + abx. Repeat CXR in 4/11 showed ? RLL nodule, which prompted the CT scan and the PET scan.  The PET on that showed uptake below the malignant range at 1.3, although there was motion degradation of the study. He was referred to Dr Edwyna Shell on May 18 for eval, also referred here to assess. He has also had spirometry done at Urgent care in [place] - per Dr Scheryl Darter notes: FEV1  0.97, FVC 1.81L. Now in a position where we need to decide regarding bx/resection vs watch and wait x 3 months with repeat imaging. he tells me that he is feeling well, no dyspnea, cough, hemoptysis.   ROV 07/31/09 -- He has seen Dr Edwyna Shell regarding most recent PFTs, shows FEV1 1.6L. CT scan was in 4/11, next one needs to be in August (3 months after PET). C/o severe exertional SOB.   ROV 09/18/09 -- follows up abnormal CT, RLL nodule. Hasn't started Spiriva yet, wanted to defer because he was feeling OK. Had repeat Ct scan today, shows enlargement of RLL nodule as below  ROV 10/03/09 -- follows up RLL nodule and hemoptysis. Since last visit, EMB confirmed NSCLCA. He has seen Dr Edwyna Shell and plan is for lobectomy. Made the transition from coumadin to enox and back to coumadin (last dose exox 8/23). Plan will be to go back to  enox on 8/31 in prep for lobectomy on 10/16/09. He has seen blood-streaked sputum since he was started on enox bridge, some dark, some BRB. It has decreased and has subsided last 24 hours.   Current Medications (verified): 1)  Adult Aspirin Ec Low Strength 81 Mg  Tbec (Aspirin) .... Take 1 Tablet By Mouth Once A Day 2)  Enalapril Maleate 20 Mg  Tabs (Enalapril Maleate) .... Take 1 Tablet By Mouth Two Times A Day 3)  Metoprolol Tartrate 100 Mg Tabs (Metoprolol Tartrate) .... Take One Tablet By Mouth Twice A Day 4)  Coumadin 5 Mg  Tabs (Warfarin Sodium) .... As Directed 5)  Amlodipine Besylate 10 Mg Tabs (Amlodipine Besylate) .... Take One Tablet By Mouth Daily 6)  Acetaminophen .... As Needed 7)  Ventolin Hfa 108 (90 Base) Mcg/act Aers (Albuterol Sulfate) .... 2 Puffs Q4h As Needed Sob 8)  Loratadine 10 Mg Tabs (Loratadine) .... Take 1 Tablet By Mouth Once A Day 9)  Enoxaparin Sodium 100 Mg/ml Soln (Enoxaparin Sodium) .... Inject 90mg  Subcutaneously Every 12 Hours As Instructed  Allergies (verified): 1)  ! Pcn  Vital Signs:  Patient profile:   77 year old male Height:      72 inches (182.88 cm) Weight:      193 pounds (87.73 kg) BMI:     26.27 O2 Sat:  96 % on Room air Temp:     97.8 degrees F (36.56 degrees C) oral Pulse rate:   56 / minute BP sitting:   140 / 76  (left arm) Cuff size:   regular  Vitals Entered By: Michel Bickers CMA (October 03, 2009 10:04 AM)  O2 Sat at Rest %:  96 O2 Flow:  Room air CC: Patient c/o blood streaked mucus 2-3 days after biopsy and after starting Lovenox injections. Blood was mixture of bright red and dark(old) blood. No increased sob. Comments Medications reviewed. Daytime phone verified. Michel Bickers CMA  October 03, 2009 10:04 AM   Physical Exam  General:  normal appearance and healthy appearing.   Head:  normocephalic and atraumatic Nose:  no deformity, discharge, inflammation, or lesions Mouth:  no deformity or lesions Neck:  no masses,  thyromegaly, or abnormal cervical nodes Lungs:  clear bilaterally to auscultation and percussion Heart:  regular rhythm and normal rate.  3/6 systolic M Abdomen:  not examined Msk:  no deformity or scoliosis noted with normal posture Extremities:  no edema Neurologic:  non-focal Skin:  intact without lesions or rashes Psych:  alert and cooperative; normal mood and affect; normal attention span and concentration   Impression & Recommendations:  Problem # 1:  CARCINOMA, LUNG, NONSMALL CELL (ICD-162.9) - plan is for lobectomy per [facility] on 10/16/09. I reassured him that I agree with this plan  Problem # 2:  HEMOPTYSIS UNSPECIFIED (ICD-786.30)  small amt now, has never been more than streaked mucous. Risk benefit here favors continuing anticoag as recommended by Anticoag Clinic. Reviewed their plan w him regarding restart enox on 8/31. He will call me if he has any increase in bleeding, any unexpected findings. If so then we will discuss adjustment of anticoag at that time.   Orders: Est. Patient Level IV ([id number])  Problem # 3:  COPD (ICD-496)  Patient Instructions: 1)  We will continue your coumadin as ordered and plan for transition back to lovenox on 8/31 as recommended by the Anticoagulation Clinic.  2)  Call our office immediately if you experience ANY increase in your bleeding above what we have discussed.  3)  Dr Delton Coombes will follow with you when you go to have surgery in September.

## 2010-03-14 NOTE — Assessment & Plan Note (Signed)
Summary: ROV/ pending DCCV   Visit Type:  6 months follow up Referring Provider:  Dr. [name] Primary Provider:  Norins  CC:  Sob.  History of Present Illness: 77 year-old male with history of aortic insufficiency who underwent St Jude mechanical AVR in the 1990's who underwent right lower lobectomy recently complicated by postoperative respiratory failure. He developed atrial fibrillation postoperatively and failed DCCV.    His breathing has stabilized, but he complains of continued problems with atrial fibrillation. He notes frequent palpitations and increased heart rate with activity. He monitors the heart rate and it goes into the 120's frequently. No chest pain. Leg swelling has improved.  Current Medications (verified): 1)  Adult Aspirin Ec Low Strength 81 Mg  Tbec (Aspirin) .... Take 1 Tablet By Mouth Once A Day 2)  Metoprolol Tartrate 100 Mg Tabs (Metoprolol Tartrate) .... Take One Tablet By Mouth Twice A Day 3)  Coumadin 5 Mg  Tabs (Warfarin Sodium) .... Take As Directed By Coumadin Clinic. 4)  Amiodarone Hcl 200 Mg Tabs (Amiodarone Hcl) .... Take One Tablet By Mouth Daily 5)  Ferrous Sulfate 325 (65 Fe) Mg Tabs (Ferrous Sulfate) .... Take One  By Mouth Two Times A Day 6)  Potassium Chloride Crys Cr 20 Meq Cr-Tabs (Potassium Chloride Crys Cr) .... Take One Tablet By Mouth Daily 7)  Pepcid 20 Mg Tabs (Famotidine) .... Take One By Mouth Once Daily As Needed 8)  Furosemide 40 Mg Tabs (Furosemide) .... Take One By Mouth Once Two Times A Day. 9)  Ventolin Hfa 108 (90 Base) Mcg/act Aers (Albuterol Sulfate) .... 2 Puffs Q4h As Needed Sob 10)  Ensure  Liqd (Nutritional Supplements) .... Three Times A Day 11)  Oxigen 3 Litters  Allergies: 1)  ! Pcn  Past History:  Past medical history reviewed for relevance to current acute and chronic problems.  Past Medical History: Current Problems:  ATRIAL FIBRILLATION (ICD-427.31) AORTIC INSUFFICIENCY (ICD-424.1) CARDIOMYOPATHY  (ICD-425.4) PERIPHERAL EDEMA (ICD-782.3)Chronic lower extremity edema. 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) UNS ADVRS EFF UNS RX MEDICINAL&BIOLOGICAL SBSTNC (ICD-995.20) UMBILICAL HERNIA (ICD-553.1) SEBACEOUS CYST, NECK (ICD-706.2) UNSPECIFIED LABYRINTHITIS (ICD-386.30) DYSPEPSIA, HX OF (ICD-V12.79) SPECIAL SCREENING MALIGNANT NEOPLASM OF PROSTATE (ICD-V76.44) HERPES ZOSTER, HX OF 1996 (ICD-V13.8) BRONCHOGENIC CARCINOMA S/P LOBECTOMY 2011  Review of Systems       Negative except as per HPI   Vital Signs:  Patient profile:   77 year old male Height:      72 inches Weight:      178.75 pounds BMI:     24.33 Pulse rate:   89 / minute Pulse rhythm:   irregular Resp:     20 per minute BP sitting:   114 / 70  (right arm) Cuff size:   large  Vitals Entered By: Vikki Ports (January 01, 2010 3:27 PM)  Physical Exam  General:  Pt is alert and oriented, in no acute distress. HEENT: normal Neck: normal carotid upstrokes without bruits, JVP normal Lungs: Decreased BS right lung field CV: RRR with 2/6 systolic ejection murmur and normal mechanical A2 Abd: soft, NT, positive BS, no bruit, no organomegaly Ext: no clubbing, cyanosis, or edema. peripheral pulses 2+ and equal Skin: warm and dry without rash    EKG  Procedure date:  01/01/2010  Findings:      Atrial fibrillation 89 bpm, otherwise within normal limits  Impression & Recommendations:  Problem #  1:  ATRIAL FIBRILLATION (ICD-427.31) Pt remains in atrial fibrillation on amiodarone and metoprolol. He is having problems with intermittent rapid heart rates and BNP remains elevated. I think we should make another attempt at DCCV now that he has stabilized after lung surgery and has been fully loaded on amiodarone.  Risks and indications of DCCV were reviewed with the patient in detail and he understands. His INR has been therapeutic for over 3 weeks now.  His updated medication list for this problem includes:    Adult Aspirin Ec Low Strength 81 Mg Tbec (Aspirin) .Marland Kitchen... Take 1 tablet by mouth once a day    Metoprolol Tartrate 100 Mg Tabs (Metoprolol tartrate) .Marland Kitchen... Take one tablet by mouth twice a day    Coumadin 5 Mg Tabs (Warfarin sodium) .Marland Kitchen... Take as directed by coumadin clinic.    Amiodarone Hcl 200 Mg Tabs (Amiodarone hcl) .Marland Kitchen... Take one tablet by mouth daily  Orders: EKG w/ Interpretation (93000) Cardioversion (Cardioversion)  Problem # 2:  CORONARY ATHEROSCLEROSIS NATIVE CORONARY ARTERY (ICD-414.01) Stable without angina. Continue current medical program.  His updated medication list for this problem includes:    Adult Aspirin Ec Low Strength 81 Mg Tbec (Aspirin) .Marland Kitchen... Take 1 tablet by mouth once a day    Metoprolol Tartrate 100 Mg Tabs (Metoprolol tartrate) .Marland Kitchen... Take one tablet by mouth twice a day    Coumadin 5 Mg Tabs (Warfarin sodium) .Marland Kitchen... Take as directed by coumadin clinic.  Problem # 3:  PROSTHETIC VALVE-MECHANICAL (ICD-V43.3) Exam remains stable. Continue current medical program.  Patient Instructions: 1)  Your physician recommends that you schedule a follow-up appointment in: 4 WEEKS 2)  Your physician has recommended that you have a cardioversion (DCCV).  Electrical cardioversion uses a jolt of electricity to your heart either through paddles or wired patches attached to your chest. This is a controlled, usually prescheduled, procedure. Defibrillation is done under light anesthesia in the hospital, and you usually go home the day of the procedure. This is done to get your heart back into a normal rhythm. You are not awake for the procedure. Please see the instruction sheet given to you today. Prescriptions: AMIODARONE HCL 200 MG TABS (AMIODARONE HCL) Take one tablet by mouth daily   #30 x 6   Entered by:   Julieta Gutting, RN, BSN   Authorized by:   Norva Karvonen, MD   Signed by:   Julieta Gutting, RN, BSN on 01/01/2010   Method used:   Electronically to        [facility] (retail)       [address]       [place], [place]  [address]       Ph: [phone] or [phone]       Fax: [phone]   RxID:   [id number]

## 2010-03-14 NOTE — Letter (Signed)
Summary: [facility]  [facility]   Imported By: Sherian Rein 07/23/2009 10:30:00  _____________________________________________________________________  External Attachment:    Type:   Image     Comment:   External Document

## 2010-03-14 NOTE — Miscellaneous (Signed)
Summary: [facility]   [facility]   Imported By: Roderic Ovens 12/27/2009 17:44:01  _____________________________________________________________________  External Attachment:    Type:   Image     Comment:   External Document

## 2010-03-14 NOTE — Medication Information (Signed)
Summary: [facility]madin Clinic  Anticoagulant Therapy  Managed by: Bethena Midget, RN, BSN Referring MD: [name] PCP: Link Snuffer MD: Tenny Craw MD, Gunnar Fusi Indication 1: Aortic Valve Replacement (ICD-V43.3) Indication 2: Aortic Valve Disorder (ICD-424.1) Lab Used: [facility] Site: [address] PT 60.1 INR POC 6.0 INR RANGE 2.5 - 3.5  Dietary changes: no    Health status changes: no    Bleeding/hemorrhagic complications: yes       Details: Pt has a skin tear on elbow, that continues to ooze blood.  Kathie Rhodes, RN [facility] placed pressure dressing while at home.   Recent/future hospitalizations: no    Any changes in medication regimen? no    Recent/future dental: no  Any missed doses?: yes     Details: Skipped last night's dosage of Coumadin secondary to oozing  elbow.   Is patient compliant with meds? yes      Comments: PT/INR/CBC drawn stat and [facility] to [facility].   Allergies: 1)  ! Pcn  Anticoagulation Management History:      His anticoagulation is being managed by telephone today.  Positive risk factors for bleeding include an age of 75 years or older.  The bleeding index is 'intermediate risk'.  Positive CHADS2 values include History of HTN.  Negative CHADS2 values include Age > 75 years old.  The start date was 09/05/1998.  His last INR was 1.7 ratio and today's INR is 5.9.  Prothrombin time is 60.1.  Anticoagulation responsible provider: Tenny Craw MD, Gunnar Fusi.  INR POC: 6.0.    Anticoagulation Management Assessment/Plan:      The patient's current anticoagulation dose is Coumadin 5 mg  tabs: Take as directed by coumadin clinic..  The target INR is 2.5-3.5.  The next INR is due 02/22/2010.  Anticoagulation instructions were given to spouse.  Results were reviewed/authorized by Bethena Midget, RN, BSN.  He was notified by Bethena Midget, RN, BSN.         Prior Anticoagulation Instructions: INR 4.2 Skip today then resume 2.5mg s daily except 5mg  on  Tuesdays, Thursdays and Saturdays. Recheck in 1 week.   Current Anticoagulation Instructions: INR 5.9 Skip today, pt skipped last night's dose then change dose to 2.5mg s daily except 5mg s on Tues and Thurs. Recheck INR in one week. Orders given to [facility] on Red team at [facility].

## 2010-03-14 NOTE — Letter (Signed)
Summary: [facility]  [facility]   Imported By: Lester [place] 11/02/2009 08:37:34  _____________________________________________________________________  External Attachment:    Type:   Image     Comment:   External Document

## 2010-03-14 NOTE — Miscellaneous (Signed)
Summary: [facility]   [facility]   Imported By: Roderic Ovens 02/21/2010 16:41:03  _____________________________________________________________________  External Attachment:    Type:   Image     Comment:   External Document

## 2010-03-14 NOTE — Assessment & Plan Note (Signed)
Summary: YEARLY FU/ LABS AFTER/NWS  #   Vital Signs:  Patient profile:   77 year old male Height:      72 inches Weight:      185 pounds BMI:     25.18 O2 Sat:      97 % on Room air Temp:     97.7 degrees F oral Pulse rate:   62 / minute BP sitting:   132 / 68  (left arm) Cuff size:   regular  Vitals Entered By: Bill Salinas CMA (March 19, 2009 1:26 PM)  O2 Flow:  Room air CC: Yearly, pt wants to discuss taking potassium. Pt has never had pneumonia, or zostavax injection. Pt is due for a tetanus shot/ ab   Primary Care Provider:  Norins  CC:  Yearly, pt wants to discuss taking potassium. Pt has never had pneumonia, and or zostavax injection. Pt is due for a tetanus shot/ ab.  History of Present Illness: Patient last seen in Primary care July '09. He has been seeing Dr. Excell Seltzer on a regular basis, most recently Nov 26, '10. Labs associated with that visit revealed excellent control of lipids (LDL 46).  He has been seeing Dr. Donia Ast for vein therapy which has been successful. He was taken off of furosemide by Dr. Excell Seltzer with resolution of peripheral edema. He is interested in stopping potassium.  He is otherwise feeling well. He is stable from a cardiac perspective - AVR but no history of CAD. He is not certain why he was on statin therapy in the absence of CAD.  He is concerned about an enlarging umbilical hernia. He can have some pain at the hernia with strain.   He c/o pruritis across the abdomen  below the umbilicus. He also has some itching on his legs as well. HOts showers make it worse.   He has been seeing dermatology and had several small lesions removed. He had a sebaceous cyst excised from his back. He has onychyomycosis with thickening of the toe nails.   Current Medications (verified): 1)  Adult Aspirin Ec Low Strength 81 Mg  Tbec (Aspirin) .... Take 1 Tablet By Mouth Once A Day 2)  Enalapril Maleate 20 Mg  Tabs (Enalapril Maleate) .... Take 1 Tablet By Mouth Two  Times A Day 3)  Metoprolol Tartrate 100 Mg Tabs (Metoprolol Tartrate) .... Take One Tablet By Mouth Twice A Day 4)  Coumadin 5 Mg  Tabs (Warfarin Sodium) .... As Directed 5)  Amlodipine Besylate 10 Mg Tabs (Amlodipine Besylate) .... Take One Tablet By Mouth Daily 6)  Simvastatin 40 Mg Tabs (Simvastatin) .... Take One Tablet By Mouth Daily At Bedtime 7)  Meclizine Hcl 25 Mg Tabs (Meclizine Hcl) .... As Needed 8)  Acetaminophen .... As Needed  Allergies (verified): 1)  ! Pcn  Past History:  Past Medical History: Last updated: 12/18/2008 AORTIC INSUFFICIENCY (ICD-424.1) s/p AVR 1994 HYPERTENSION (ICD-401.9) DYSLIPIDEMIA (ICD-272.4) HERPES ZOSTER, HX OF 1996 (ICD-V13.8)  Past Surgical History: Last updated: March 19, 2007 Aortic Valve Replacement: '94 - St Jude's  Appendectomy-youth repair of abdominal wall hernia  Family History: Last updated: Mar 19, 2007 father - deceased 93; colon cancer mother - deceased 89; aneurysm rupture - neck., CAD Neg - prostate cancer, DM,   Social History: Last updated: 03/19/2007 married '70  1 daughter  '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  Review of Systems  The patient denies anorexia, fever, weight loss, weight gain, decreased hearing, chest pain, dyspnea on exertion, peripheral edema, headaches, abdominal pain, incontinence, muscle  weakness, difficulty walking, depression, enlarged lymph nodes, and angioedema.    Physical Exam  General:  WNWD white male in no distress Head:  normocephalic, atraumatic, and no abnormalities palpated.   Eyes:  vision grossly intact, pupils equal, pupils round, corneas and lenses clear, and no injection.   Ears:  cerumen bilaterally but TMs visualized (poorly). Nose:  no external deformity and no external erythema.   Mouth:  edentulous with upper plate. No oral lesions. Posterior pharynx clear.  Neck:  full ROM and no thyromegaly.   Chest Wall:  no deformity. [facility] sternotomy scar Lungs:  normal respiratory  effort, normal breath sounds, no crackles, and no wheezes.   Heart:  normal rate, regular rhythm, no JVD, and no HJR.   Abdomen:  BS +. Large umbilical hernia that is reducible but tender at reduction. abdomen otherwise OK>  Msk:  normal ROM, no joint tenderness, no joint swelling, no joint warmth, and no joint deformities.   Pulses:  2+ radial and DP pulses Extremities:  no varicosities. No real swellling.  Neurologic:  alert & oriented X3, cranial nerves II-XII intact, strength normal in all extremities, gait normal, and DTRs symmetrical and normal.   Skin:  turgor normal, color normal, no suspicious lesions, and no petechiae.  Large cyst at the posterior neck. He has pock-marks from acne. Multiple comedons. Several dark keratotic lesions.  Cervical Nodes:  no anterior cervical adenopathy and no posterior cervical adenopathy.   Psych:  Oriented X3, memory intact for recent and remote, normally interactive, and good eye contact.     Impression & Recommendations:  Problem # 1:  CORONARY ATHEROSCLEROSIS NATIVE CORONARY ARTERY (ICD-414.01) Seems stable at this time. No chest pain.  Plan - follow-up with cardiology  His updated medication list for this problem includes:    Adult Aspirin Ec Low Strength 81 Mg Tbec (Aspirin) .Marland Kitchen... Take 1 tablet by mouth once a day    Enalapril Maleate 20 Mg Tabs (Enalapril maleate) .Marland Kitchen... Take 1 tablet by mouth two times a day    Metoprolol Tartrate 100 Mg Tabs (Metoprolol tartrate) .Marland Kitchen... Take one tablet by mouth twice a day    Amlodipine Besylate 10 Mg Tabs (Amlodipine besylate) .Marland Kitchen... Take one tablet by mouth daily  Problem # 2:  UNSPECIFIED LABYRINTHITIS (ICD-386.30) still will have occasional dizziness with position change  Plan - contiue meclizine as needed  Problem # 3:  HYPERTENSION (ICD-401.9)  His updated medication list for this problem includes:    Enalapril Maleate 20 Mg Tabs (Enalapril maleate) .Marland Kitchen... Take 1 tablet by mouth two times a day     Metoprolol Tartrate 100 Mg Tabs (Metoprolol tartrate) .Marland Kitchen... Take one tablet by mouth twice a day    Amlodipine Besylate 10 Mg Tabs (Amlodipine besylate) .Marland Kitchen... Take one tablet by mouth daily  BP today: 132/68 Prior BP: 134/70 (12/18/2008)  Labs Reviewed: K+: 4.4 (05/18/2008) Creat: : 1.3 (05/18/2008)   Chol: 138 (03/09/2007)   HDL: 33.4 (03/09/2007)   LDL: DEL (03/09/2007)   TG: 220 (03/09/2007)  adequate control. Will continue present medications  Problem # 4:  DYSLIPIDEMIA (ICD-272.4) Has no history of CAD. He is interested in a drug holiday.  Plan - to stop simvastatin and return in 1 month for follow-up labs.   His updated medication list for this problem includes:    Simvastatin 40 Mg Tabs (Simvastatin) .Marland Kitchen... Take one tablet by mouth daily at bedtime  Problem # 5:  SEBACEOUS CYST, NECK (ICD-706.2) Patient has cystic acne. He  also has had excisio of a sebaceous cyst in the past. He has a large cyst on the posterior neck at the hairline.  Plan - follow-up with dermatology for possible exceision.  Problem # 6:  UMBILICAL HERNIA (ICD-553.1) Patient with an enlarging umbilical hernia that is now tender at times and is tender when reduced.  Plan - refer to Dr. Carolynne Edouard for surgical consultation. He will need lovenox bridging if he comes to surgery.   Problem # 7:  PERIPHERAL EDEMA (ICD-782.3) Markedly improved after successful vein therapy by Dr. Donia Ast. He is now off of diuretics.  Plan - hold potassium with recheck lab in one month  Problem # 8:  Preventive Health Care (ICD-V70.0) Normal exam. No new labs. Last colonoscopy n 2002. He is due for immunizations: pneumonia, tetnus and shingles.   In summary - a nice man with medical problems as outlined. He does seem stable at this time. He will return for lab as noted.  Complete Medication List: 1)  Adult Aspirin Ec Low Strength 81 Mg Tbec (Aspirin) .... Take 1 tablet by mouth once a day 2)  Enalapril Maleate 20 Mg Tabs (Enalapril  maleate) .... Take 1 tablet by mouth two times a day 3)  Metoprolol Tartrate 100 Mg Tabs (Metoprolol tartrate) .... Take one tablet by mouth twice a day 4)  Coumadin 5 Mg Tabs (Warfarin sodium) .... As directed 5)  Amlodipine Besylate 10 Mg Tabs (Amlodipine besylate) .... Take one tablet by mouth daily 6)  Simvastatin 40 Mg Tabs (Simvastatin) .... Take one tablet by mouth daily at bedtime 7)  Meclizine Hcl 25 Mg Tabs (Meclizine hcl) .... As needed 8)  Acetaminophen  .... As needed

## 2010-03-14 NOTE — Medication Information (Signed)
Summary: Coumadin Clinic  Anticoagulant Therapy  Managed by: Bethena Midget, RN, BSN Referring MD: [name] PCP: Link Snuffer MD: Juanda Chance MD, Bruce Indication 1: Aortic Valve Replacement (ICD-V43.3) Indication 2: Aortic Valve Disorder (ICD-424.1) Lab Used: [facility] [place] Site: [address] PT 40.5 INR POC 3.4 INR RANGE 2.5 - 3.5  Dietary changes: no    Health status changes: no    Bleeding/hemorrhagic complications: no    Recent/future hospitalizations: yes       Details: Left  lower lobectomy  Any changes in medication regimen? yes       Details: New med Amiodarone 200mg s  BID til 11/19/09 then 200mg s daily. Iron 325mg s BID, Lasix 40mg s daily, Hydrocodone PRN, K-Cl daily. Prednisone dose completed. Enalapril discontinued and Norvasc.   Recent/future dental: no  Any missed doses?: no       Is patient compliant with meds? yes      Comments: Discharged home on Sat. taking 2.5mg s daily except T,T&Sun 5mg s. 11/10/09  INR 1.9 in Hospital  Allergies: 1)  ! Pcn  Anticoagulation Management History:      His anticoagulation is being managed by telephone today.  Positive risk factors for bleeding include an age of 75 years or older.  The bleeding index is 'intermediate risk'.  Positive CHADS2 values include History of HTN.  Negative CHADS2 values include Age > 52 years old.  The start date was 09/05/1998.  Prothrombin time is 40.5.  Anticoagulation responsible provider: Juanda Chance MD, Smitty Cords.  INR POC: 3.4.    Anticoagulation Management Assessment/Plan:      The patient's current anticoagulation dose is Coumadin 5 mg  tabs: AS DIRECTED.  The target INR is 2.5-3.5.  The next INR is due 11/19/2009.  Anticoagulation instructions were given to home health nurse.  Results were reviewed/authorized by Bethena Midget, RN, BSN.  He was notified by Bethena Midget, RN, BSN.         Prior Anticoagulation Instructions: INR 1.9  Take an extra  tablet today, then resume  same dosage 1/2 tablet on Wednesdays, and 1 tablet all other days Until 5 days prior to surgery. Last dose of Coumadin on 10/10/09, no Coumadin or Lovenox on 10/11/09, then start Lovenox 90mg  on 10/12/09 in the morning continue Lovenox every 12hrs, Stop Lovenox on 10/15/09 after morning dosage.   Current Anticoagulation Instructions: INR 3.4 Change dose to 2.5mg s daily except 5mg s on Sundays. Orders given to [facility] while at home.

## 2010-03-14 NOTE — Progress Notes (Signed)
Summary: order to continue nursing    Phone Note From Other Clinic   [facility]er: [facility]ty [facility] [phone] Request: Talk with Nurse Summary of Call: need an order to continue nursing for 4 weeks.  Initial call taken by: Lorne Skeens,  December 04, 2009 4:22 PM  Follow-up for Phone Call        ok with me Follow-up by: Norva Karvonen, MD,  December 04, 2009 5:07 PM  Additional Follow-up for Phone Call Additional follow up Details #1::        I spoke with Kathie Rhodes and made her aware that Dr Excell Seltzer gave verbal order for 4 additional weeks of home health care.  Additional Follow-up by: Julieta Gutting, RN, BSN,  December 04, 2009 5:32 PM

## 2010-03-14 NOTE — Assessment & Plan Note (Signed)
Summary: eph/1-2 weeks per chris/mt   Referring Provider:  Dr. [name] Primary Provider:  Norins   History of Present Illness: Primary Cardiologist:  Dr. [name]  John [name] is a 77 yo male with a h/o AI s/p St. Jude AVR in the 1990s, NICM with EF 40-45%, HTN and HLP who recently underwent right lower lobectomy 2/2 to carcinoma.  He developed post op AFib and failed DCCV.  He was placed on Amiodarone and underwent DCCV on 12/2 with restoration of NSR.  He was admitted 12/14 to 12/20 with acute on chronic respiratory failure.  CT scan demonstrated increased interstitial lung densities.  There was concern he had developed Amio. toxicity and his amiodarone was discontinued.  He had a CTA that was negative for pulmonary embolism.  He was also noted to have mucus plugging, mild CHF and chronic right pleural effusion contributing to his respiratory failure.  Follow up echo on 01/25/10 demonstrated EF 45%; mean AV gradient of 9 mmHg, mild MR, mod BAE, mild reduced RVF, mod TR, PASP 104 mmHg (severe pulm. HTN).  He returns for follow up.  He is feeling much better.  His breathing has improved.  He was able to walk into the office today without getting short of breath.  He denies chest discomfort.  He denies syncope he denies palpitations.  He denies orthopnea, PND or significant pedal edema.  Current Medications (verified): 1)  Adult Aspirin Ec Low Strength 81 Mg  Tbec (Aspirin) .... Take 1 Tablet By Mouth Once A Day 2)  Metoprolol Tartrate 100 Mg Tabs (Metoprolol Tartrate) .... Take 1/2 Tablet By Mouth Twice A Day 3)  Coumadin 5 Mg  Tabs (Warfarin Sodium) .... Take As Directed By Coumadin Clinic. 4)  Ferrous Sulfate 325 (65 Fe) Mg Tabs (Ferrous Sulfate) .... Take One  By Mouth Two Times A Day 5)  Potassium Chloride Crys Cr 20 Meq Cr-Tabs (Potassium Chloride Crys Cr) .... Take One Tablet By Mouth Daily 6)  Pepcid 20 Mg Tabs (Famotidine) .... Take One By Mouth Once Daily As Needed 7)   Furosemide 40 Mg Tabs (Furosemide) .... Take One By Mouth Once Two Times A Day. 8)  Ventolin Hfa 108 (90 Base) Mcg/act Aers (Albuterol Sulfate) .... 2 Puffs Q4h As Needed Sob 9)  Ensure  Liqd (Nutritional Supplements) .... Three Times A Day As Needed 10)  Oxygen .... Wear 3.5l/min To 4l/min Continuously 11)  Fluticasone Propionate 50 Mcg/act Susp (Fluticasone Propionate) .... 2 Sprays Each Nostril Two Times A Day 12)  Spiriva Handihaler 18 Mcg Caps (Tiotropium Bromide Monohydrate) .... Inhale Contents of 1 Capsule Once A Day 13)  Symbicort 80-4.5 Mcg/act Aero (Budesonide-Formoterol Fumarate) .... Inhale 2 Puffs Two Times A Day 14)  Mucinex 600 Mg Xr12h-Tab (Guaifenesin) .... Take 1-2 Tablets Every 12 Hours As Needed 15)  Prednisone 20 Mg Tabs (Prednisone) .... 2 Tabs By Mouth Once Daily  Allergies (verified): 1)  ! Pcn  Past History:  Past Medical History: Last updated: 01/01/2010 Current Problems:  ATRIAL FIBRILLATION (ICD-427.31) AORTIC INSUFFICIENCY (ICD-424.1) CARDIOMYOPATHY (ICD-425.4) PERIPHERAL EDEMA (ICD-782.3)Chronic lower extremity edema. 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  He has noted some epistaxis.  Otherwise, he denies any bleeding.  Vital Signs:  Patient profile:   77 year old male Height:      72 inches Weight:      170 pounds BMI:     23.14 Pulse  rate:   71 / minute Resp:     16 per minute BP sitting:   128 / 72  (right arm)  Vitals Entered By: Marrion Coy, CNA (February 08, 2010 1:26 PM)  Physical Exam  General:  Well nourished, well developed, in no acute distress HEENT: normal except for telangiectasia about lower lips Neck: no JVD at 90 degrees Cardiac:  normal S1, S2; RRR; no murmur Lungs:  clear to auscultation bilaterally, no wheezing, rhonchi or rales; decreased breath sounds RLL Abd: soft, nontender, no hepatomegaly Ext: no edema Skin: warm and dry Neuro:  CNs 2-12 intact, no focal abnormalities noted    EKG  Procedure date:  02/08/2010  Findings:      Normal Sinus Rhythm Heart rate 63 Leftward axis and Nonspecific ST-T wave changes  Impression & Recommendations:  Problem # 1:  ATRIAL FIBRILLATION (ICD-427.31)  Maintaining NSR. Now off Amio. Hopefully, he will maintain NSR with anti-arrhytmic therapy. He remains on coumadin.  His INR has been high in the setting of prednisone.  He has developed some bruising on his lips.  Otherwise, he is tolerating this.  Orders: EKG w/ Interpretation (93000)  Problem # 2:  PNEUMONITIS (ICD-486) As above, he is off amiodarone. He will continue his slow taper of prednisone per pulmonary.  Problem # 3:  CARCINOMA, LUNG, NONSMALL CELL (ICD-162.9) s/p RLL lobectomy.  Problem # 4:  AORTIC INSUFFICIENCY (ICD-424.1) s/p SJ AVR on chronic coumadin.  Problem # 5:  HYPERTENSION (ICD-401.9) Controlled.  Patient Instructions: 1)  Your physician recommends that you schedule a follow-up appointment in:  03/26/2010 WITH DR. Excell Seltzer 2)  Your physician recommends that you continue on your current medications as directed. Please refer to the Current Medication list given to you today.

## 2010-03-14 NOTE — Medication Information (Signed)
Summary: rov/sp  Anticoagulant Therapy  Managed by: Cloyde Reams, RN, BSN Referring MD: [name] PCP: Link Snuffer MD: Johney Frame MD, Fayrene Fearing Indication 1: Aortic Valve Replacement (ICD-V43.3) Indication 2: Aortic Valve Disorder (ICD-424.1) Lab Used: [facility] Site: [address] INR POC 3.0 INR RANGE 2.5 - 3.5  Dietary changes: no    Health status changes: no    Bleeding/hemorrhagic complications: no    Recent/future hospitalizations: no    Any changes in medication regimen? no    Recent/future dental: no  Any missed doses?: no       Is patient compliant with meds? yes       Allergies: 1)  ! Pcn  Anticoagulation Management History:      The patient is taking warfarin and comes in today for a routine follow up visit.  Positive risk factors for bleeding include an age of 75 years or older.  The bleeding index is 'intermediate risk'.  Positive CHADS2 values include History of HTN.  Negative CHADS2 values include Age > 46 years old.  The start date was 09/05/1998.  Anticoagulation responsible provider: Chet Greenley MD, Fayrene Fearing.  INR POC: 3.0.  Cuvette Lot#: [id number].  Exp: 10/2010.    Anticoagulation Management Assessment/Plan:      The patient's current anticoagulation dose is Coumadin 5 mg  tabs: AS DIRECTED.  The target INR is 2.5-3.5.  The next INR is due 09/12/2009.  Anticoagulation instructions were given to patient.  Results were reviewed/authorized by Cloyde Reams, RN, BSN.  He was notified by Cloyde Reams RN.         Prior Anticoagulation Instructions: INR 2.9  Continue same dose of 1 tablet every day except 1/2 tablet on Wednesday  Current Anticoagulation Instructions: INR 3.0  Continue on same dosage 1 tablet daily except 1/2 tablet on Wednesdays.  Recheck in 4 weeks.

## 2010-03-14 NOTE — Assessment & Plan Note (Signed)
Summary: f47m      Allergies Added:   Visit Type:  Follow-up Primary Provider:  Norins  CC:  SOB.  History of Present Illness: 77 year-old male with history of aortic insufficiency who underwent St Jude mechanical AVR in the 1990's, presenting today for follow-up evaluation.  The pt has mild exertional dyspnea, but no recent change in his symptoms. He denies chest pain, palpitations, edema, or other complaints. He is tolerating coumadin without bleeding complications.    Current Medications (verified): 1)  Adult Aspirin Ec Low Strength 81 Mg  Tbec (Aspirin) .... Take 1 Tablet By Mouth Once A Day 2)  Enalapril Maleate 20 Mg  Tabs (Enalapril Maleate) .... Take 1 Tablet By Mouth Two Times A Day 3)  Metoprolol Tartrate 100 Mg Tabs (Metoprolol Tartrate) .... Take One Tablet By Mouth Twice A Day 4)  Coumadin 5 Mg  Tabs (Warfarin Sodium) .... As Directed 5)  Amlodipine Besylate 10 Mg Tabs (Amlodipine Besylate) .... Take One Tablet By Mouth Daily 6)  Meclizine Hcl 25 Mg Tabs (Meclizine Hcl) .... As Needed 7)  Acetaminophen .... As Needed  Allergies (verified): 1)  ! Pcn  Past History:  Past medical history reviewed for relevance to current acute and chronic problems.  Past Medical History: Reviewed history from 06/15/2009 and no changes required. Current Problems:  AORTIC INSUFFICIENCY (ICD-424.1) CARDIOMYOPATHY (ICD-425.4) PERIPHERAL EDEMA (ICD-782.3)Chronic lower extremity edema. 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) UNS ADVRS EFF UNS RX MEDICINAL&BIOLOGICAL SBSTNC (ICD-995.20) UMBILICAL HERNIA (ICD-553.1) SEBACEOUS CYST, NECK (ICD-706.2) UNSPECIFIED LABYRINTHITIS (ICD-386.30) DYSPEPSIA, HX OF (ICD-V12.79) SPECIAL SCREENING MALIGNANT NEOPLASM OF PROSTATE (ICD-V76.44) HERPES  ZOSTER, HX OF 1996 (ICD-V13.8)  Review of Systems       Negative except as per HPI   Vital Signs:  Patient profile:   77 year old male Height:      72 inches Weight:      183 pounds BMI:     24.91 Pulse rate:   62 / minute Resp:     14 per minute BP sitting:   142 / 70  (left arm) Cuff size:   large  Vitals Entered By: Hardin Negus, RMA (Jun 20, 2009 12:14 PM)  Physical Exam  General:  Pt is alert and oriented, in no acute distress. HEENT: normal Neck: normal carotid upstrokes without bruits, JVP normal Lungs: CTA CV: RRR with 2/6 systolic ejection murmur at LSB and normal mechanical aortic closure sounds Abd: soft, NT, positive BS, no bruit, no organomegaly Ext: no clubbing, cyanosis, or edema. peripheral pulses 2+ and equal Skin: warm and dry without rash    EKG  Procedure date:  06/20/2009  Findings:      NSR with PAC's, HR 62 bpm.  Impression & Recommendations:  Problem # 1:  AORTIC INSUFFICIENCY (ICD-424.1) S/P AVR. Continue coumadin. Exam stable. Clinical f/u 6 months.  His updated medication list for this problem includes:    Enalapril Maleate 20 Mg Tabs (Enalapril maleate) .Marland Kitchen... Take 1 tablet by mouth two times a day    Metoprolol Tartrate 100 Mg Tabs (Metoprolol tartrate) .Marland Kitchen... Take one tablet by mouth twice a day  Orders: EKG w/ Interpretation (93000)  Problem # 2:  HYPERTENSION (ICD-401.9) Assessment: Unchanged Stable. In range at last visit and minimal BP elevation today. Will follow for now.  His updated medication list for this problem includes:    Adult Aspirin Ec Low Strength  81 Mg Tbec (Aspirin) .Marland Kitchen... Take 1 tablet by mouth once a day    Enalapril Maleate 20 Mg Tabs (Enalapril maleate) .Marland Kitchen... Take 1 tablet by mouth two times a day    Metoprolol Tartrate 100 Mg Tabs (Metoprolol tartrate) .Marland Kitchen... Take one tablet by mouth twice a day    Amlodipine Besylate 10 Mg Tabs (Amlodipine besylate) .Marland Kitchen... Take one tablet by mouth daily  Orders: EKG w/  Interpretation (93000)  BP today: 142/70 Prior BP: 132/68 (03/19/2009)  Labs Reviewed: K+: 4.3 (05/01/2009) Creat: : 0.9 (05/01/2009)   Chol: 179 (05/01/2009)   HDL: 32.00 (05/01/2009)   LDL: DEL (03/09/2007)   TG: 259.0 (05/01/2009)  Problem # 3:  DYSLIPIDEMIA (ICD-272.4) Direct LDL 92.   The following medications were removed from the medication list:    Simvastatin 40 Mg Tabs (Simvastatin) .Marland Kitchen... Take one tablet by mouth daily at bedtime  CHOL: 179 (05/01/2009)   LDL: DEL (03/09/2007)   HDL: 32.00 (05/01/2009)   TG: 259.0 (05/01/2009)  Other Orders: Pulmonary Referral (Pulmonary)  Patient Instructions: 1)  Your physician recommends that you continue on your current medications as directed. Please refer to the Current Medication list given to you today. 2)  Your physician wants you to follow-up in: 6 MONTHS.   You will receive a reminder letter in the mail two months in advance. If you don't receive a letter, please call our office to schedule the follow-up appointment. 3)  You have been referred to Dr Delton Coombes in Pulmonary for evaluation of lung nodule.

## 2010-03-14 NOTE — Assessment & Plan Note (Signed)
Summary: ROV  Medications Added POTASSIUM CHLORIDE CRYS CR 20 MEQ CR-TABS (POTASSIUM CHLORIDE CRYS CR) Take one tablet by mouth daily FUROSEMIDE 40 MG TABS (FUROSEMIDE) Take one by mouth once two times a day. ENSURE  LIQD (NUTRITIONAL SUPPLEMENTS) three times a day        Visit Type:  Follow-up Referring Provider:  Dr. [name] Primary Provider:  Debby Bud  CC:  Sob.  History of Present Illness: 77 year-old male with history of aortic insufficiency who underwent St Jude mechanical AVR in the 1990's who underwent right lower lobectomy recently complicated by postoperative respiratory failure. He developed atrial fibrillation postoperatively and failed DCCV.    Continues to have problems with dyspnea, remains on home O2. Also reports leg swelling. He feels poorly when his HR increases and he reports episodic rates up to the 115 range. No other complaints at present.    Current Medications (verified): 1)  Adult Aspirin Ec Low Strength 81 Mg  Tbec (Aspirin) .... Take 1 Tablet By Mouth Once A Day 2)  Metoprolol Tartrate 100 Mg Tabs (Metoprolol Tartrate) .... Take One Tablet By Mouth Twice A Day 3)  Coumadin 5 Mg  Tabs (Warfarin Sodium) .... Take As Directed By Coumadin Clinic. 4)  Amiodarone Hcl 200 Mg Tabs (Amiodarone Hcl) .... Take One Tablet By Mouth Daily 5)  Ferrous Sulfate 325 (65 Fe) Mg Tabs (Ferrous Sulfate) .... Take One  By Mouth Two Times A Day 6)  Potassium Chloride Crys Cr 20 Meq Cr-Tabs (Potassium Chloride Crys Cr) .... Take One Tablet By Mouth Daily 7)  Pepcid 20 Mg Tabs (Famotidine) .... Take One By Mouth Once Daily As Needed 8)  Furosemide 40 Mg Tabs (Furosemide) .... Take One By Mouth Once Daily 9)  Ventolin Hfa 108 (90 Base) Mcg/act Aers (Albuterol Sulfate) .... 2 Puffs Q4h As Needed Sob 10)  Ensure  Liqd (Nutritional Supplements) .... Three Times A Day  Allergies: 1)  ! Pcn  Past History:  Past medical history reviewed for relevance to current acute and  chronic problems.  Past Medical History: Current Problems:  AORTIC INSUFFICIENCY (ICD-424.1) CARDIOMYOPATHY (ICD-425.4) PERIPHERAL EDEMA (ICD-782.3)Chronic lower extremity edema. 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) UNS ADVRS EFF UNS RX MEDICINAL&BIOLOGICAL SBSTNC (ICD-995.20) UMBILICAL HERNIA (ICD-553.1) SEBACEOUS CYST, NECK (ICD-706.2) UNSPECIFIED LABYRINTHITIS (ICD-386.30) DYSPEPSIA, HX OF (ICD-V12.79) SPECIAL SCREENING MALIGNANT NEOPLASM OF PROSTATE (ICD-V76.44) HERPES ZOSTER, HX OF 1996 (ICD-V13.8) BRONCHOGENIC CARCINOMA S/P LOBECTOMY 2011  Review of Systems       Negative except as per HPI   Vital Signs:  Patient profile:   77 year old male Height:      72 inches Weight:      179.75 pounds BMI:     24.47 Pulse rate:   69 / minute Pulse rhythm:   irregular Resp:     20 per minute BP sitting:   120 / 60  (left arm) Cuff size:   large  Vitals Entered By: Vikki Ports (December 06, 2009 11:17 AM)  Physical Exam  General:  Pt is alert and oriented, very pleasant male in a wheelchair, in no acute distress. HEENT: normal Neck: normal carotid upstrokes without bruits, JVP mildly increased Lungs: decreased breath sounds on the right CV: irregularly irregular with a soft ejection murmur and crisp mechanical heart sounds. Abd: soft, NT, positive BS, no bruit, no organomegaly Ext: trace right leg and 1+ left leg edema.  peripheral pulses 2+ and equal Skin: warm and dry without rash     EKG  Procedure date:  12/06/2009  Findings:      Atrial fibrillation with a competing junctional pacemaker, 70 bpm, nonspecific ST abnormality.  Impression & Recommendations:  Problem # 1:  ATRIAL FIBRILLATION (ICD-427.31) Pt remains in atrial fibrillation. He has been on  amiodarone since his hospitalization. I would favor another attempt at DCCV since he is having swings in heart rate and continues to have problems with CHF.  Will arrange for weekly INR's as his last INR was 1.8 and he will need 3 weeks of a therapeutic INR prior to cardioversion. Continue amiodarone and metoprolol.  His updated medication list for this problem includes:    Adult Aspirin Ec Low Strength 81 Mg Tbec (Aspirin) .Marland Kitchen... Take 1 tablet by mouth once a day    Metoprolol Tartrate 100 Mg Tabs (Metoprolol tartrate) .Marland Kitchen... Take one tablet by mouth twice a day    Coumadin 5 Mg Tabs (Warfarin sodium) .Marland Kitchen... Take as directed by coumadin clinic.    Amiodarone Hcl 200 Mg Tabs (Amiodarone hcl) .Marland Kitchen... Take one tablet by mouth daily  Problem # 2:  AORTIC INSUFFICIENCY (ICD-424.1) Pt s/p St Judes AVR - stable on exam.  His updated medication list for this problem includes:    Metoprolol Tartrate 100 Mg Tabs (Metoprolol tartrate) .Marland Kitchen... Take one tablet by mouth twice a day    Furosemide 40 Mg Tabs (Furosemide) .Marland Kitchen... Take one by mouth once two times a day.  Problem # 3:  CARDIOMYOPATHY (ICD-425.4) Pt with clinical signs of CHF. Echo reviewed and LVEF is 45%. Recommend increase furosemide to 40 mg two times a day and f/u bmet and bnp 2 weeks.  His updated medication list for this problem includes:    Adult Aspirin Ec Low Strength 81 Mg Tbec (Aspirin) .Marland Kitchen... Take 1 tablet by mouth once a day    Metoprolol Tartrate 100 Mg Tabs (Metoprolol tartrate) .Marland Kitchen... Take one tablet by mouth twice a day    Coumadin 5 Mg Tabs (Warfarin sodium) .Marland Kitchen... Take as directed by coumadin clinic.    Amiodarone Hcl 200 Mg Tabs (Amiodarone hcl) .Marland Kitchen... Take one tablet by mouth daily    Furosemide 40 Mg Tabs (Furosemide) .Marland Kitchen... Take one by mouth once two times a day.  Patient Instructions: 1)  Your physician recommends that you schedule a follow-up appointment in: 4 weeks. 2)  Your physician recommends that your home health nurse draws  your blood to check a BMP and BNP in 2 weeks. Your home health RN will check your PT/INR weekly for the next month until we get your INR therapeutic for your cardioversion. 3)  Your physician has recommended you make the following change in your medication: INCREASE your Lasix to 40mg  by mouth two times a day.  Prescriptions: FUROSEMIDE 40 MG TABS (FUROSEMIDE) Take one by mouth once two times a day.  #60 x 3   Entered by:   Whitney Maeola Sarah RN   Authorized by:   Norva Karvonen, MD   Signed by:   Ellender Hose RN on 12/06/2009   Method used:   Electronically to        [facility] [employer] [employer] (retail)       [address]       [place], [place]  [address]       Ph: [phone] or [phone]  Fax: [phone]   RxID:   [id number]

## 2010-03-14 NOTE — Medication Information (Signed)
Summary: Order to Continue Nurse Visits and Physical Therapy  Order to Continue Nurse Visits and Physical Therapy   Imported By: Marylou Mccoy 01/11/2010 17:14:19  _____________________________________________________________________  External Attachment:    Type:   Image     Comment:   External Document

## 2010-03-14 NOTE — Medication Information (Signed)
Summary: Coumadin Clinic  Anticoagulant Therapy  Managed by: Cloyde Reams, RN, BSN Referring MD: [name] PCP: Link Snuffer MD: Tenny Craw MD, Gunnar Fusi Indication 1: Aortic Valve Replacement (ICD-V43.3) Indication 2: Aortic Valve Disorder (ICD-424.1) Lab Used: [facility] Site: [address] PT 43.4 INR POC 3.6 INR RANGE 2.5 - 3.5  Dietary changes: no    Health status changes: no    Bleeding/hemorrhagic complications: yes       Details: Occ nosebleeding, from irritation.    Recent/future hospitalizations: no    Any changes in medication regimen? yes       Details: Decr Prednisone tomorrow to 10mg  daily, will complete next Friday.   Recent/future dental: no  Any missed doses?: no       Is patient compliant with meds? yes       Allergies: 1)  ! Pcn  Anticoagulation Management History:      The patient is taking warfarin and comes in today for a routine follow up visit.  Positive risk factors for bleeding include an age of 75 years or older.  The bleeding index is 'intermediate risk'.  Positive CHADS2 values include History of HTN.  Negative CHADS2 values include Age > 75 years old.  The start date was 09/05/1998.  His last INR was 5.9.  Prothrombin time is 43.4.  Anticoagulation responsible provider: Tenny Craw MD, Gunnar Fusi.  INR POC: 3.6.  Exp: 03/2011.    Anticoagulation Management Assessment/Plan:      The patient's current anticoagulation dose is Coumadin 5 mg  tabs: Take as directed by coumadin clinic..  The target INR is 2.5-3.5.  The next INR is due 03/20/2010.  Anticoagulation instructions were given to home health nurse.  Results were reviewed/authorized by Cloyde Reams, RN, BSN.  He was notified by Cloyde Reams RN.         Prior Anticoagulation Instructions: INR 3.2 Continue 2.5mg s daily. Recheck in one week. Orders given to [facility] with [facility] while in home with Pt.   Current Anticoagulation Instructions: INR 3.6  Spoke with Kathie Rhodes, [facility] RN while  at pt's home.  Advised to continue on same dosage 2.5mg  daily, eat something green today.  Today is last [facility] visit.  Scheduled pt to return for f/u in clinic on 03/20/10.

## 2010-03-14 NOTE — Progress Notes (Signed)
Summary: Pending DCCV  ---- Converted from flag ---- ---- 12/27/2009 3:17 PM, Julieta Gutting, RN, BSN wrote: this pt will need an INR on 11/22 at [facility] appt---pt pending DCCV ------------------------------       Additional Follow-up for Phone Call Additional follow up Details #2::    Pt pending DCCV, appt s/c for 01/01/10. Follow-up by: Bethena Midget, RN, BSN,  December 28, 2009 2:24 PM

## 2010-03-14 NOTE — Medication Information (Signed)
Summary: rov/eh  Anticoagulant Therapy  Managed by: Cloyde Reams, RN, BSN Referring MD: [name] PCP: Link Snuffer MD: Riley Kill MD, Maisie Fus Indication 1: Aortic Valve Replacement (ICD-V43.3) Indication 2: Aortic Valve Disorder (ICD-424.1) Lab Used: [facility] Site: [address] INR POC 3.8 INR RANGE 2.5 - 3.5  Dietary changes: no    Health status changes: no    Bleeding/hemorrhagic complications: no    Recent/future hospitalizations: no    Any changes in medication regimen? no    Recent/future dental: no  Any missed doses?: no       Is patient compliant with meds? yes       Allergies (verified): 1)  ! Pcn  Anticoagulation Management History:      The patient is taking warfarin and comes in today for a routine follow up visit.  Positive risk factors for bleeding include an age of 25 years or older.  The bleeding index is 'intermediate risk'.  Positive CHADS2 values include History of HTN.  Negative CHADS2 values include Age > 44 years old.  The start date was 09/05/1998.  Anticoagulation responsible provider: Riley Kill MD, Maisie Fus.  INR POC: 3.8.  Cuvette Lot#: [id number].  Exp: 05/2010.    Anticoagulation Management Assessment/Plan:      The patient's current anticoagulation dose is Coumadin 5 mg  tabs: AS DIRECTED.  The target INR is 2.5-3.5.  The next INR is due 04/17/2009.  Anticoagulation instructions were given to patient.  Results were reviewed/authorized by Cloyde Reams, RN, BSN.  He was notified by Cloyde Reams RN.         Prior Anticoagulation Instructions: INR 3.5  Take 0.5 tablet tomorrow then continue same dose 1 tablet daily. Recheck in 3-4 weeks.      Current Anticoagulation Instructions: INR 3.8  Start taking 1 tablet daily except 1/2 tablet on Wednesdays.  Recheck in 3-4 weeks.

## 2010-03-14 NOTE — Letter (Signed)
Summary: [facility]   [facility]   Imported By: Roderic Ovens 12/04/2009 13:59:36  _____________________________________________________________________  External Attachment:    Type:   Image     Comment:   External Document

## 2010-03-14 NOTE — Letter (Signed)
Summary: [facility]   Imported By: Marylou Mccoy 02/18/2010 18:06:12  _____________________________________________________________________  External Attachment:    Type:   Image     Comment:   External Document

## 2010-03-14 NOTE — Progress Notes (Signed)
Summary: f/u biopsy/ hemoptysis  Phone Note Call from Patient Call back at Home Phone [phone]   Caller: Patient Call For: byrum Summary of Call: pt c/o coughing up blood- says it's "not much" but is with sputum. pt had a biopsy. wants to know when he should see RB. first avail is in sept.  Initial call taken by: Tivis Ringer, CNA,  October 01, 2009 11:49 AM  Follow-up for Phone Call        Called ans spoke with pt.  He states that he had bronch on 09/24/09- 3 days later started to cough up some blood steaked sputum.  He states that it comes up every time he coughs or clears his throat.  Pt stated that he it not having any other issues.  Wants to know if he needs to be seen this wk.  He states that he is sched to have surgery in 1 wk with Dr Edwyna Shell.  Will forward to doc of the day.  Pls advise thanks Follow-up by: Vernie Murders,  October 01, 2009 1:46 PM  Additional Follow-up for Phone Call Additional follow up Details #1::        pl arrange FU OV with RB. Some hemoptysis is to be expected after a biopsy esp. if he is on coumadin. Pl ask him to call & let dr Scheryl Darter ofice know. Additional Follow-up by: Comer Locket. Vassie Loll MD,  October 01, 2009 3:05 PM    Additional Follow-up for Phone Call Additional follow up Details #2::    Spoke with pt and notified of the above recs per RA.  I sched him ov with RB for Wed 8/24 at 10 am.  Pt aware to call Dr Edwyna Shell and let him know what is going on.   Follow-up by: Vernie Murders,  October 01, 2009 3:26 PM

## 2010-03-14 NOTE — Miscellaneous (Signed)
Summary: [facility]   [facility]   Imported By: Roderic Ovens 12/25/2009 12:05:50  _____________________________________________________________________  External Attachment:    Type:   Image     Comment:   External Document

## 2010-03-14 NOTE — Miscellaneous (Signed)
Summary: [facility]   [facility]   Imported By: Roderic Ovens 01/18/2010 14:19:13  _____________________________________________________________________  External Attachment:    Type:   Image     Comment:   External Document

## 2010-03-14 NOTE — Assessment & Plan Note (Signed)
Summary: RLL nodule, COPD   Visit Type:  Follow-up Copy to:  Dr. [name] Primary Provider/Referring Provider:  Debby Bud  CC:  Follow up with CT Chest.  Pt states cough has resolved and breathing is better even though he did not start spiriva.  Very little wheezing.  He does c/o some chest tightness but relates this to starting loratadine.  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 Referred for RLL nodule identified by CXR and CT scan of the chest in 4/11.  He tells me that he was told many yrs ago (as long ago as age 9!) that he has had some abnormality on CXR in the R lung.   Originally had flu-like signs in February/March, treated for PNA by an Urgent Care. Clinically improved with Pred + abx. Repeat CXR in 4/11 showed ? RLL nodule, which prompted the CT scan and the PET scan.  The PET on that showed uptake below the malignant range at 1.3, although there was motion degradation of the study. He was referred to Dr Edwyna Shell on May 18 for eval, also referred here to assess. He has also had spirometry done at Urgent care in [place] - per Dr Scheryl Darter notes: FEV1  0.97, FVC 1.81L. Now in a position where we need to decide regarding bx/resection vs watch and wait x 3 months with repeat imaging. he tells me that he is feeling well, no dyspnea, cough, hemoptysis.   ROV 07/31/09 -- He has seen Dr Edwyna Shell regarding most recent PFTs, shows FEV1 1.6L. CT scan was in 4/11, next one needs to be in August (3 months after PET). C/o severe exertional SOB.   ROV 09/18/09 -- follows up abnormal CT, RLL nodule. Hasn't started Spiriva yet, wanted to defer because he was feeling OK. Had repeat Ct scan today, shows enlargement of RLL nodule as below  Current Medications (verified): 1)  Adult Aspirin Ec Low Strength 81 Mg  Tbec (Aspirin) .... Take 1 Tablet By Mouth Once A Day 2)  Enalapril Maleate 20 Mg  Tabs (Enalapril Maleate) .... Take 1 Tablet By Mouth  Two Times A Day 3)  Metoprolol Tartrate 100 Mg Tabs (Metoprolol Tartrate) .... Take One Tablet By Mouth Twice A Day 4)  Coumadin 5 Mg  Tabs (Warfarin Sodium) .... As Directed 5)  Amlodipine Besylate 10 Mg Tabs (Amlodipine Besylate) .... Take One Tablet By Mouth Daily 6)  Acetaminophen .... As Needed 7)  Ventolin Hfa 108 (90 Base) Mcg/act Aers (Albuterol Sulfate) .... 2 Puffs Q4h As Needed Sob 8)  Loratadine 10 Mg Tabs (Loratadine) .... Take 1 Tablet By Mouth Once A Day  Allergies (verified): 1)  ! Pcn  Vital Signs:  Patient profile:   77 year old male Height:      72 inches Weight:      193 pounds BMI:     12.66 O2 Sat:      96 % on Room air Temp:     98.7 degrees F oral Pulse rate:   60 / minute BP sitting:   118 / 72  (right arm) Cuff size:   regular  Vitals Entered By: Gweneth Dimitri RN (September 18, 2009 2:19 PM)  O2 Flow:  Room air CC: Follow up with CT Chest.  Pt states cough has resolved and breathing is better even though he did not start spiriva.  Very little wheezing.  He does c/o some chest tightness but  relates this to starting loratadine.   Comments Medications reviewed with patient Daytime contact number verified with patient. Gweneth Dimitri RN  September 18, 2009 2:18 PM    Physical Exam  General:  normal appearance and healthy appearing.   Head:  normocephalic and atraumatic Nose:  no deformity, discharge, inflammation, or lesions Mouth:  no deformity or lesions Neck:  no masses, thyromegaly, or abnormal cervical nodes Lungs:  clear bilaterally to auscultation and percussion Heart:  regular rhythm and normal rate.  3/6 systolic M Abdomen:  not examined Msk:  no deformity or scoliosis noted with normal posture Extremities:  no edema Neurologic:  non-focal Skin:  intact without lesions or rashes Psych:  alert and cooperative; normal mood and affect; normal attention span and concentration   Impression & Recommendations:  Problem # 1:  PULMONARY NODULE  (ICD-518.89)  Enlarged on todays scan, more lobulated. I believe we need to pursue tissue bx - EMB FOB on Aug 15, 2nd case - hold coumadin and cover for AV with enoxaparin   Orders: Est. Patient Level IV ([address])  Problem # 2:  COPD (ICD-496) No BD's at this time  Medications Added to Medication List This Visit: 1)  Loratadine 10 Mg Tabs (Loratadine) .... Take 1 tablet by mouth once a day  Patient Instructions: 1)  We will perform bronchoscopy and lung biopsies on August 15th at [facility]. You will be contacted by the hospital on the day prior to your procedure to give you instructions about the test.  2)  We will need to hold your coumadin and start enoxaparin prior to your case. You will NOT take the enoxaparin on the morning of the procedure. We will get this adjusted through the [facility] anticaogulation clinic.

## 2010-03-14 NOTE — Medication Information (Signed)
Summary: rov.also for labs..mp  Anticoagulant Therapy  Managed by: Cloyde Reams, RN, BSN Referring MD: [name] PCP: Link Snuffer MD: Myrtis Ser MD, Tinnie Gens Indication 1: Aortic Valve Replacement (ICD-V43.3) Indication 2: Aortic Valve Disorder (ICD-424.1) Lab Used: [facility] Site: [address] INR POC 3.1 INR RANGE 2.5 - 3.5  Dietary changes: no    Health status changes: no    Bleeding/hemorrhagic complications: no    Recent/future hospitalizations: no    Any changes in medication regimen? no    Recent/future dental: no  Any missed doses?: no       Is patient compliant with meds? yes       Allergies (verified): 1)  ! Pcn  Anticoagulation Management History:      The patient is taking warfarin and comes in today for a routine follow up visit.  Positive risk factors for bleeding include an age of 75 years or older.  The bleeding index is 'intermediate risk'.  Positive CHADS2 values include History of HTN.  Negative CHADS2 values include Age > 66 years old.  The start date was 09/05/1998.  Anticoagulation responsible provider: Myrtis Ser MD, Tinnie Gens.  INR POC: 3.1.  Cuvette Lot#: [id number].  Exp: 06/2010.    Anticoagulation Management Assessment/Plan:      The patient's current anticoagulation dose is Coumadin 5 mg  tabs: AS DIRECTED.  The target INR is 2.5-3.5.  The next INR is due 05/29/2009.  Anticoagulation instructions were given to patient.  Results were reviewed/authorized by Cloyde Reams, RN, BSN.  He was notified by Cloyde Reams RN.         Prior Anticoagulation Instructions: INR 4.3 Skip Wednesday's dose then resume 5mg s daily except 2.5mg s on Wednesdays. Recheck in 2 weeks.   Current Anticoagulation Instructions: INR 3.1  Continue on same dosage 1 tablet daily except 1/2 tablet on Wednesdays.  Recheck in 4 weeks.

## 2010-03-14 NOTE — Letter (Signed)
Summary: [facility] Office Visit Note   [facility] Office Visit Note   Imported By: Roderic Ovens 01/17/2010 14:50:34  _____________________________________________________________________  External Attachment:    Type:   Image     Comment:   External Document

## 2010-03-14 NOTE — Medication Information (Signed)
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 yes       Details: still on prednisone  Recent/future dental: no  Any missed doses?: no       Is patient compliant with meds? yes       Allergies: 1)  ! Pcn  Anticoagulation Management History:      His anticoagulation is being managed by telephone today.  Positive risk factors for bleeding include an age of 75 years or older.  The bleeding index is 'intermediate risk'.  Positive CHADS2 values include History of HTN.  Negative CHADS2 values include Age > 75 years old.  The start date was 09/05/1998.  His last INR was 1.7 ratio.  Anticoagulation responsible provider: Myrtis Ser MD, Tinnie Gens.  INR POC: 5.1.  Exp: 01/2011.    Anticoagulation Management Assessment/Plan:      The patient's current anticoagulation dose is Coumadin 5 mg  tabs: Take as directed by coumadin clinic..  The target INR is 2.5-3.5.  The next INR is due 02/08/2010.  Anticoagulation instructions were given to patient/ [facility].  Results were reviewed/authorized by Weston Brass, PharmD.  He was notified by Weston Brass PharmD.         Prior Anticoagulation Instructions: INR 2.4 Continue 2.5mg s daily except 5mg s on T,T& Sat. Recheck in 5 days. Orders given to [facility] with [facility] while at home with pt.   Current Anticoagulation Instructions: INR 5.1  Spoke with Kathie Rhodes with [facility] while in pt's home.  Skip today's dose of Coumadin then resume same dose of 1/2 tablet every day  except 1 tablet on Tuesday, Thursday and Saturday.  Eat extra greens today and tomorrow.  Recheck INR on Friday in office.

## 2010-03-14 NOTE — Medication Information (Signed)
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  Has been using inhaler more.  RN listened to lungs and they sound clear.   Bleeding/hemorrhagic complications: no    Recent/future hospitalizations: no    Any changes in medication regimen? no    Recent/future dental: no  Any missed doses?: no       Is patient compliant with meds? yes       Allergies: 1)  ! Pcn  Anticoagulation Management History:      The patient is taking warfarin and comes in today for a routine follow up visit.  Positive risk factors for bleeding include an age of 75 years or older.  The bleeding index is 'intermediate risk'.  Positive CHADS2 values include History of HTN.  Negative CHADS2 values include Age > 16 years old.  The start date was 09/05/1998.  His last INR was 1.7 ratio.  Prothrombin time is 38.8.  Anticoagulation responsible provider: Tenny Craw MD, Gunnar Fusi.  INR POC: 3.2.  Exp: 01/2011.    Anticoagulation Management Assessment/Plan:      The patient's current anticoagulation dose is Coumadin 5 mg  tabs: Take as directed by coumadin clinic..  The target INR is 2.5-3.5.  The next INR is due 01/25/2010.  Anticoagulation instructions were given to patient/ [facility].  Results were reviewed/authorized by Weston Brass, PharmD.  He was notified by Weston Brass PharmD.         Prior Anticoagulation Instructions: INR 4.8  Spoke with Kathie Rhodes with [facility].  Pt already taken Coumadin today.  Take 1/2 tablet tomorrow and eat extra greens for the next few days.  Recheck INR in 1 week.   Current Anticoagulation Instructions: INR  3.2  Spoke with Kathie Rhodes with [facility].  Continue same dose of 1/2 tablet every day except 1 tablet on Tuesday, Thursday and Saturday.  Recheck INR in 1 week.

## 2010-03-14 NOTE — Letter (Signed)
Summary: Cardioversion  [facility], Main Office  [address]   [place], [place] [address]   Phone: [phone]  Fax: [phone]    Cardioversion Instructions  01/01/2010 MRN: [id number]  [facility] [address] [place], [place]  [address]  Dear Mr. Rolison, You are scheduled for a Cardioversion on Thursday January 10, 2010 with Dr. Excell Seltzer.   Please arrive at the [facility] at 12:00 noon on the day of your procedure.  1)   DIET:    May have clear liquid breakfast, then nothing to eat or drink after 8:00 a.m.       Clear liquids include:  water, broth, Sprite, Ginger Ale, black coffee, tea (no sugar),      cranberry / grape / apple juice, jello (not red), popsicle from clear juices (not red).  2)   MAKE SURE YOU TAKE YOUR COUMADIN.  3)   A)   DO NOT TAKE these medications before your procedure:      Do not take Metoprolol the morning of procedure.   B)   YOU MAY TAKE ALL of your remaining medications with a small amount of water.   4)  Must have a responsible person to drive you home.  5)   Bring a current list of your medications and current insurance cards.   * Special Note:  Every effort is made to have your procedure done on time. Occasionally there are emergencies that present themselves at the hospital that may cause delays. Please be patient if a delay does occur.  * If you have any questions after you get home, please call the office at [phone].

## 2010-03-14 NOTE — Medication Information (Signed)
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  Plan to watch and wait at this time rather than doing any invasive procedures.   Bleeding/hemorrhagic complications: no    Recent/future hospitalizations: no    Any changes in medication regimen? no    Recent/future dental: no  Any missed doses?: no       Is patient compliant with meds? yes       Allergies: 1)  ! Pcn  Anticoagulation Management History:      The patient is taking warfarin and comes in today for a routine follow up visit.  Positive risk factors for bleeding include an age of 75 years or older.  The bleeding index is 'intermediate risk'.  Positive CHADS2 values include History of HTN.  Negative CHADS2 values include Age > 19 years old.  The start date was 09/05/1998.  Anticoagulation responsible provider: Juanda Chance MD, Smitty Cords.  INR POC: 2.9.  Cuvette Lot#: [id number].  Exp: 09/2010.    Anticoagulation Management Assessment/Plan:      The patient's current anticoagulation dose is Coumadin 5 mg  tabs: AS DIRECTED.  The target INR is 2.5-3.5.  The next INR is due 08/15/2009.  Anticoagulation instructions were given to patient.  Results were reviewed/authorized by Weston Brass, PharmD.  He was notified by Weston Brass PharmD.         Prior Anticoagulation Instructions: INR 2.1  Take extra 1 tablet today then resume same dose of 1 tablet every day except 1/2 tablet on Wednesday.   Current Anticoagulation Instructions: INR 2.9  Continue same dose of 1 tablet every day except 1/2 tablet on Wednesday

## 2010-03-14 NOTE — Letter (Signed)
Summary: [facility]  [facility]   Imported By: Lester [place] 07/12/2009 11:33:20  _____________________________________________________________________  External Attachment:    Type:   Image     Comment:   External Document

## 2010-03-14 NOTE — Medication Information (Signed)
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  Needs Lovenox bridge prior to surgery.  Last dose of Coumadin on 10/10/09.  Any changes in medication regimen? no    Recent/future dental: no  Any missed doses?: yes     Details: Resumed Coumadin s/p bronchoscopy on 09/24/09. Resumed on 09/25/09.  Is patient compliant with meds? yes      Comments: Pt set up with Lovenox bridge at OV today for Lobectomy scheduled for 10/16/09 with Dr Edwyna Shell.   Allergies: 1)  ! Pcn  Anticoagulation Management History:      The patient is taking warfarin and comes in today for a routine follow up visit.  Positive risk factors for bleeding include an age of 53 years or older.  The bleeding index is 'intermediate risk'.  Positive CHADS2 values include History of HTN.  Negative CHADS2 values include Age > 91 years old.  The start date was 09/05/1998.  Anticoagulation responsible provider: Daleen Squibb MD, Maisie Fus.  INR POC: 1.9.  Exp: 11/2010.    Anticoagulation Management Assessment/Plan:      The patient's current anticoagulation dose is Coumadin 5 mg  tabs: AS DIRECTED.  The target INR is 2.5-3.5.  The next INR is due 10/29/2009.  Anticoagulation instructions were given to patient.  Results were reviewed/authorized by Cloyde Reams, RN, BSN.  He was notified by Cloyde Reams RN.         Prior Anticoagulation Instructions: Last dose of Coumadin today, no Coumadin or Lovenox tomorrow.  Start Lovenox 90mg  in am on 09/20/09, continue on Lovenox  injections every 12 hours.  Last dose of Lovenox 09/23/09 pm dosage.  No Lovenox on 09/24/09 day of procedure.  Resume Coumadin after procedure per MD instruction 1.5 tablets x 2 doses then 1 tablet x 2 doses then check PT/INR.  Resume Lovenox every 12 hours on 09/25/09 until recheck on 09/28/09.   Current Anticoagulation Instructions: INR 1.9  Take an extra  tablet today, then resume same dosage 1/2 tablet on Wednesdays, and 1 tablet all other days Until 5 days prior to surgery. Last dose of Coumadin on 10/10/09, no Coumadin or Lovenox on 10/11/09, then start Lovenox 90mg  on 10/12/09 in the morning continue Lovenox every 12hrs, Stop Lovenox on 10/15/09 after morning dosage.  Prescriptions: ENOXAPARIN SODIUM 100 MG/ML SOLN (ENOXAPARIN SODIUM) Inject 90mg  subcutaneously every 12 hours as instructed  #7 x 0   Entered by:   Cloyde Reams RN   Authorized by:   Leslye Peer MD   Signed by:   Cloyde Reams RN on 10/02/2009   Method used:   Electronically to        [facility] [employer] (retail)       [address]       [place], [place]  [address]       Ph: [phone] or [phone]       Fax: [phone]   RxID:   [id number]

## 2010-03-14 NOTE — Progress Notes (Signed)
Summary: protime/steroids  Phone Note Other Incoming   Caller: [email or web address]--[phone] Summary of Call: Kathie Rhodes w/ [facility] calling asking if patient can come off steroids.  She states he has been on steroids since being d/c from hospital which was the week of Christmas and it is complicating his protime.  INR's have been between 5.0-6.0. Initial call taken by: Lehman Prom,  February 22, 2010 11:00 AM  Follow-up for Phone Call        Spoke with Kathie Rhodes at [facility].  Pt taking 40 mg prednisone daily since end of Dec and since then his INRs have been between 5 and 6.  She wants to know if pt can start tapering prednisone to help with this.  Pt sched to see RB on 03/04/10.  RB out of the office so will forward to doc of the day. PW, pls advise thanks Follow-up by: Vernie Murders,  February 22, 2010 11:15 AM  Additional Follow-up for Phone Call Additional follow up Details #1::        steroids do NOT affect protimes/INR nevertheless ok to taper to 30mg  per day then down by 10mg  every 7days until 20mg  per day and stay Additional Follow-up by: Storm Frisk MD,  February 22, 2010 11:28 AM    Additional Follow-up for Phone Call Additional follow up Details #2::    Spoke with Kathie Rhodes and notified of the above recs per PW.  She verbalized understanding. Follow-up by: Vernie Murders,  February 22, 2010 11:30 AM

## 2010-03-14 NOTE — Assessment & Plan Note (Signed)
Summary: COPD, NSCLCA   Visit Type:  Follow-up Copy to:  Dr. [name] Primary Provider/Referring Provider:  Debby Bud  CC:  1 month followup COPD.  Pt states has had no more hemoptysis but has had some nose bleeds.  He states that he feels that his breathing is "a little better". .  History of Present Illness: 77 yo former smoker with COPD, [employer] AVR for AI in 1994 (on warfarin). Referred for RLL nodule identified by CXR and CT scan of the chest in 4/11.  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   ROV 09/18/09 -- follows up abnormal CT, RLL nodule. Hasn't started Spiriva yet, wanted to defer because he was feeling OK. Had repeat Ct scan today, shows enlargement of RLL nodule as below  ROV 10/03/09 -- follows up RLL nodule and hemoptysis. Since last visit, ENB confirmed NSCLCA. He has seen Dr Edwyna Shell and plan is for lobectomy. Made the transition from coumadin to enox and back to coumadin (last dose exox 8/23). Plan will be to go back to enox on 8/31 in prep for lobectomy on 10/16/09. He has seen blood-streaked sputum since he was started on enox bridge, some dark, some BRB. It has decreased and has subsided last 24 hours.  11/26/09--Presents for a work in visit. Recently admitted for  RLL for lung cancer. (Recent FOB  which biopsies were positive for carcinoma in RLL nodule). He was admited on 9/6-9/28 for a complicated hospitalization. Underwent lobectomy on 9/6  w/ high grade output requiring transfusion of plat, frozen plasma.  He was taken back to OR on 10/17/09 for exploratory thoracotomy with evacuation of hematoma and control bleeding. He slowly weaned of vent 10/20/09.  After extubation he develped AFib w/ RVR  and was started on amiodarone.  Cards evaluation was done. Pt had persistent afib , and was cardioverted on 10/29/09 -which was unsucessful. He has resp distress on 9/20. requiring reintubation.  BAL showed Klebsiella. Tx w/ aggressive pulmoanry  hygiene. Extubated on 11/02/09.  He was continued on coumadin therapy. Last INR was 6.9 coumadin was held and repeat INR was 1.7 last week.  He slowly improved and was discharged on 11/07/09. Since discharge he contnues to be weak. and wears out easily . Seen last week by Dr. Edwyna Shell CXR showed persistent Moderate hydropneumothorax on the right is unchanged. Mild improvement in right perihilar airspace disease. Legs are more swollen, feels like he is retaining fluid. Denies chest pain,  orthopnea, hemoptysis, fever, n/v/d. O2 sat today are 89-92% on O2.   ROV 11/29/09 -- follows up NSCLCA s/p RLL lobectomy. Complicated post-op course, hemothorax; left hosp with hydroPTX, on O2. On coumadin for AVR. Seen by TP 3d ago weak, dyspneic, edematous. Took doubled lasix to 80mg  once daily x 2 days. Due to have INR next Tuesday. Feels that his breathing is good at rest, difficult w exercise. Wearing O2 at 4L/min. Having periods of tachycardia.   ROV 01/08/10 -- f/u visit COPD, NSCLCA s/p lobectomy. Also A Fib + RVR following w Dr Excell Seltzer. Wearing O2 at 4L/min, doing home PT/rehab. He does still have desats to 80's with exertion. Planning to undergo cardioversion Thursday. Taking   Allergies: 1)  ! 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 Take 1 tablet by mouth once a day    Metoprolol Tartrate 100 Mg Tabs (Metoprolol tartrate) .Marland Kitchen... Take one tablet by mouth twice a day    Coumadin 5 Mg Tabs (Warfarin sodium) .Marland Kitchen... Take as directed by coumadin clinic.    Amiodarone Hcl 200 Mg Tabs (Amiodarone hcl) .Marland Kitchen... Take one tablet by mouth daily  Problem # 4:  ALLERGIC RHINITIS CAUSE UNSPECIFIED (ICD-477.9)  - start fluticasone spray  His updated medication list for this problem includes:    Fluticasone Propionate 50 Mcg/act Susp (Fluticasone propionate) .Marland Kitchen... 2 sprays each nostril two times a day  Orders: Est. Patient Level IV (04540)  Medications Added to Medication List This Visit: 1)  Fluticasone Propionate 50 Mcg/act Susp (Fluticasone propionate) .... 2 sprays each nostril two times a day  Patient Instructions: 1)  Please continue your ProAir as needed  2)  Wear your oxygen at 3.5 - 4 L/min 3)  We will revist starting a longer-acting inhaled medication to see if this helps your breathing.  4)  Start fluticasone nasal spray, 2 sprays each nostril two times a day  5)  Continue your physical therapy.  6)  Cardioversion as planned by Dr Excell Seltzer 7)  Follow up with Dr Delton Coombes in 2 months or as needed  Prescriptions: FLUTICASONE PROPIONATE 50 MCG/ACT SUSP (FLUTICASONE PROPIONATE) 2 sprays each nostril two times a day  #1 x 11   Entered and Authorized by:   Leslye Peer MD   Signed by:   Leslye Peer MD on 01/08/2010   Method used:   Electronically to        [facility] [employer] (retail)       [address]       [place], [place]  [address]       Ph: [phone] or [phone]       Fax: [phone]   RxID:   [id number]

## 2010-03-14 NOTE — Assessment & Plan Note (Signed)
Summary: NSCLCA s/p lobectomy, A Fib, CM   Visit Type:  Follow-up Copy to:  Dr. [name] Primary Provider/Referring Provider:  Debby Bud  CC:  hfu. pt states breathing is doing "good". pt states he still gets sob with activity. pt c/o dry cough. some chest congestion and tightness which happens in afternoon. Pt states he has some swelling in his legs. .  History of Present Illness: 77 yo former smoker with COPD, St Jude's AVR for AI in 1994 (on warfarin). Referred for RLL nodule identified by CXR and CT scan of the chest in 4/11.  He tells me that he was told many yrs ago (as long ago as age 45!) that he has had some abnormality on CXR in the R lung.   Originally had flu-like signs in February/March, treated for PNA by an Urgent Care. Clinically improved with Pred + abx. Repeat CXR in 4/11 showed ? RLL nodule, which prompted the CT scan and the PET scan.  The PET on that showed uptake below the malignant range at 1.3, although there was motion degradation of the study. He was referred to Dr Edwyna Shell on May 18 for eval, also referred here to assess. He has also had spirometry done at Urgent care in [place] - per Dr Scheryl Darter notes: FEV1  0.97, FVC 1.81L. Now in a position where we need to decide regarding bx/resection vs watch and wait x 3 months with repeat imaging. he tells me that he is feeling well, no dyspnea, cough, hemoptysis.   ROV 07/31/09 -- He has seen Dr Edwyna Shell regarding most recent PFTs, shows FEV1 1.6L. CT scan was in 4/11, next one needs to be in August (3 months after PET). C/o severe exertional SOB.   ROV 09/18/09 -- follows up abnormal CT, RLL nodule. Hasn't started Spiriva yet, wanted to defer because he was feeling OK. Had repeat Ct scan today, shows enlargement of RLL nodule as below  ROV 10/03/09 -- follows up RLL nodule and hemoptysis. Since last visit, EMB confirmed NSCLCA. He has seen Dr Edwyna Shell and plan is for lobectomy. Made the transition from coumadin to enox and back to  coumadin (last dose exox 8/23). Plan will be to go back to enox on 8/31 in prep for lobectomy on 10/16/09. He has seen blood-streaked sputum since he was started on enox bridge, some dark, some BRB. It has decreased and has subsided last 24 hours.   11/26/09--Presents for a work in visit. Recently admitted for  RLL for lung cancer. (Recent FOB  which biopsies were positive for carcinoma in RLL nodule). He was admited on 9/6-9/28 for a complicated hospitalization. Underwent lobectomy on 9/6  w/ high grade output requiring transfusion of plat, frozen plasma.  He was taken back to OR on 10/17/09 for exploratory thoracotomy with evacuation of hematoma and control bleeding. He slowly weaned of vent 10/20/09.  After extubation he develped AFib w/ RVR  and was started on amiodarone.  Cards evaluation was done. Pt had persistent afib , and was cardioverted on 10/29/09 -which was unsucessful. He has resp distress on 9/20. requiring reintubation.  BAL showed Klebsiella. Tx w/ aggressive pulmoanry hygiene. Extubated on 11/02/09.  He was continued on coumadin therapy. Last INR was 6.9 coumadin was held and repeat INR was 1.7 last week.  He slowly improved and was discharged on 11/07/09. Since discharge he contnues to be weak. and wears out easily . Seen last week by Dr. Edwyna Shell CXR showed persistent Moderate hydropneumothorax on the right is unchanged.  Mild improvement in right perihilar airspace disease. Legs are more swollen, feels like he is retaining fluid. Denies chest pain,  orthopnea, hemoptysis, fever, n/v/d. O2 sat today are 89-92% on O2.   ROV 11/29/09 -- follows up NSCLCA s/p RLL lobectomy. Complicated post-op course, hemothorax; left hosp with hydroPTX, on O2. On coumadin for AVR. Seen by TP 3d ago weak, dyspneic, edematous. Took doubled lasix to 80mg  once daily x 2 days. Due to have INR next Tuesday. Feels that his breathing is good at rest, difficult w exercise. Wearing O2 at 4L/min. Having periods of tachycardia.    Current Medications (verified): 1)  Adult Aspirin Ec Low Strength 81 Mg  Tbec (Aspirin) .... Take 1 Tablet By Mouth Once A Day 2)  Metoprolol Tartrate 100 Mg Tabs (Metoprolol Tartrate) .... Take One Tablet By Mouth Twice A Day 3)  Coumadin 5 Mg  Tabs (Warfarin Sodium) .... Take As Directed By Coumadin Clinic. 4)  Amiodarone Hcl 200 Mg Tabs (Amiodarone Hcl) .... Take One Tablet By Mouth Daily 5)  Ferrous Sulfate 325 (65 Fe) Mg Tabs (Ferrous Sulfate) .... Take One  By Mouth Two Times A Day 6)  Potassium Chloride Cr 10 Meq Cr-Caps (Potassium Chloride) .... Take One By Mouth Once Daily 7)  Pepcid 20 Mg Tabs (Famotidine) .... Take One By Mouth Once Daily As Needed 8)  Furosemide 40 Mg Tabs (Furosemide) .... Take One By Mouth Once Daily 9)  Ventolin Hfa 108 (90 Base) Mcg/act Aers (Albuterol Sulfate) .... 2 Puffs Q4h As Needed Sob  Allergies (verified): 1)  ! Pcn  Review of Systems       The patient complains of shortness of breath with activity, non-productive cough, irregular heartbeats, loss of appetite, difficulty swallowing, and hand/feet swelling.  The patient denies shortness of breath at rest, productive cough, coughing up blood, chest pain, acid heartburn, indigestion, weight change, abdominal pain, sore throat, tooth/dental problems, headaches, nasal congestion/difficulty breathing through nose, sneezing, itching, ear ache, anxiety, depression, joint stiffness or pain, rash, change in color of mucus, and fever.    Vital Signs:  Patient profile:   77 year old male Height:      72 inches Weight:      176 pounds BMI:     23.96 O2 Sat:      94 % on Room air Temp:     97.9 degrees F oral Pulse rate:   79 / minute BP sitting:   102 / 60  (left arm) Cuff size:   regular  Vitals Entered By: Carver Fila (November 29, 2009 2:33 PM)  O2 Flow:  Room air CC: hfu. pt states breathing is doing "good". pt states he still gets sob with activity. pt c/o dry cough. some chest congestion and  tightness which happens in afternoon. Pt states he has some swelling in his legs.  Comments meds and allergies updated Phone number updated Carver Fila  November 29, 2009 2:34 PM    Physical Exam  General:  normal appearance and healthy appearing.   Head:  normocephalic and atraumatic Nose:  no deformity, discharge, inflammation, or lesions Mouth:  no deformity or lesions Neck:  no masses, thyromegaly, or abnormal cervical nodes Heart:  regular rhythm and normal rate.  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 whether he is having more episodes of tachycardia - he believes this is the case, likely explains his worsening edema. Just decreased amio to 200 once daily last week. Will speak w Dr Excell Seltzer to get his assistance.   His updated medication list for this problem includes:    Adult Aspirin Ec Low Strength 81 Mg Tbec (Aspirin) .Marland Kitchen... Take 1 tablet by mouth once a day    Metoprolol Tartrate 100 Mg Tabs (Metoprolol tartrate) .Marland Kitchen... Take one tablet by mouth twice a day    Coumadin 5 Mg Tabs (Warfarin sodium) .Marland Kitchen... Take as directed by coumadin clinic.    Amiodarone Hcl 200 Mg Tabs (Amiodarone hcl) .Marland Kitchen... Take one tablet by mouth daily  Orders: Est. Patient Level IV (04540)  Patient Instructions: 1)  Continue your lasix at 40mg  once daily  2)  Continue your oxygen at 4L/min 3)  Continue your coumadin as directed.  4)  Dr Delton Coombes will speak to Dr Excell Seltzer about your heart rate and swelling in your legs.  5)  Follow with Dr Delton Coombes in 1 month.  6)  Call our office sooner if you have any  trouble   Immunization History:  Influenza Immunization History:    Influenza:  historical (10/20/2009)

## 2010-03-14 NOTE — Medication Information (Signed)
Summary: Coumadin Clinic  Anticoagulant Therapy  Managed by: Bethena Midget, RN, BSN Referring MD: [name] PCP: Link Snuffer MD: Riley Kill MD, Maisie Fus Indication 1: Aortic Valve Replacement (ICD-V43.3) Indication 2: Aortic Valve Disorder (ICD-424.1) Lab Used: [facility] Site: [address] PT 25.1 INR POC 2.4 INR RANGE 2.5 - 3.5  Dietary changes: no    Health status changes: no    Bleeding/hemorrhagic complications: no    Recent/future hospitalizations: no    Any changes in medication regimen? no    Recent/future dental: no  Any missed doses?: no       Is patient compliant with meds? yes       Allergies: 1)  ! Pcn  Anticoagulation Management History:      His anticoagulation is being managed by telephone today.  Positive risk factors for bleeding include an age of 30 years or older.  The bleeding index is 'intermediate risk'.  Positive CHADS2 values include History of HTN.  Negative CHADS2 values include Age > 57 years old.  The start date was 09/05/1998.  His last INR was 1.7 ratio.  Prothrombin time is 25.1.  Anticoagulation responsible provider: Riley Kill MD, Maisie Fus.  INR POC: 2.4.    Anticoagulation Management Assessment/Plan:      The patient's current anticoagulation dose is Coumadin 5 mg  tabs: Take as directed by coumadin clinic..  The target INR is 2.5-3.5.  The next INR is due 12/24/2009.  Anticoagulation instructions were given to patient/ Dawn @ [facility].  Results were reviewed/authorized by Bethena Midget, RN, BSN.  He was notified by Bethena Midget, RN, BSN.         Prior Anticoagulation Instructions: INR 1.8  Spoke with Kathie Rhodes with [facility].  Take 5mg  today and tomorrow then increase dose to 2.5mg  daily except 5mg  on Tuesday and Saturday.  Recheck INR in 1 week.   Current Anticoagulation Instructions: INR 2.4 Change dose to 1/2 pill everyday except 1 pill on T,T& Sat.  Recheck in 12 days. Orders given to [facility] with [facility].

## 2010-03-14 NOTE — Letter (Signed)
[facility]: [facility]   Imported By: Marylou Mccoy 02/06/2010 18:13:12  _____________________________________________________________________  External Attachment:    Type:   Image     Comment:   External Document

## 2010-03-14 NOTE — Miscellaneous (Signed)
Summary: [facility] Orders   [facility] Orders   Imported By: Roderic Ovens 12/25/2009 12:04:39  _____________________________________________________________________  External Attachment:    Type:   Image     Comment:   External Document

## 2010-03-14 NOTE — Assessment & Plan Note (Signed)
Summary: not feeling well-lb   Vital Signs:  Patient profile:   77 year old male Height:      72 inches Weight:      191 pounds BMI:     26.00 O2 Sat:      95 % on Room air Temp:     98.3 degrees F oral Pulse rate:   59 / minute BP sitting:   138 / 74  (left arm) Cuff size:   regular  Vitals Entered By: Bill Salinas CMA (August 20, 2009 2:25 PM)  O2 Flow:  Room air CC: pt here with c/o Heavy nasal drainage, ear aches with sore throat, sneezing and fatigue with SOB/ ab   Primary Care Provider:  Briahnna Harries  CC:  pt here with c/o Heavy nasal drainage, ear aches with sore throat, and sneezing and fatigue with SOB/ ab.  History of Present Illness: Patient returns for follow-up. IN the interval since his last visit in February he was seen in a PA clinic in [place] and diagnosed with pneumonia in late March and was greated with gemafloxin, steroids and bronchodilator. He returned there for recurrent symptoms and had an abnormal chest x-ray leading to a CT scan chest which revealed a parenchymal nodule RLL. PET scan followed which revealed low up-take. He was seen in consultation by Dr. Edwyna Shell who felt it was unlikely this was a endobronchial malignancy but favored hamartoma vs carcinoid turmor. Patient also saw Dr. Excell Seltzer in routine follow-up who recommended pulmonary consult.   The patient came to see Dr. Delton Coombes who reviewed all studies. Full PFTs were done at [facility] which revealed severed restrictive COPD and low diffusion capacity c/w emphysema. Patient at this study had a poor response to bronchodilator treatment. Dr. Delton Coombes recommended spiriva which the patient has yet to start for lack of understanding of the purpose of the inhaler and a fear of addiction/dependence. Dr. Delton Coombes, with concurrence from Dr. Edwyna Shell, has recommended a follow-up CT chest in August before making any surgical decision.  The patient does have a smoking history. He does c/o DOE and decreased exercise tolerance. He has quit  smoking but reports that it is still a real challenge. Other complaints include rhinorrhea with post-nasal drainage, otalgia that is intermittent, sore throat which is intermittent. He does have minimal sinus congestion. He denies fever, sweats, chills, weight loss. He says his neck will feel clammy at times. He has not had purulent sputum or mucus.   Current Medications (verified): 1)  Adult Aspirin Ec Low Strength 81 Mg  Tbec (Aspirin) .... Take 1 Tablet By Mouth Once A Day 2)  Enalapril Maleate 20 Mg  Tabs (Enalapril Maleate) .... Take 1 Tablet By Mouth Two Times A Day 3)  Metoprolol Tartrate 100 Mg Tabs (Metoprolol Tartrate) .... Take One Tablet By Mouth Twice A Day 4)  Coumadin 5 Mg  Tabs (Warfarin Sodium) .... As Directed 5)  Amlodipine Besylate 10 Mg Tabs (Amlodipine Besylate) .... Take One Tablet By Mouth Daily 6)  Meclizine Hcl 25 Mg Tabs (Meclizine Hcl) .... As Needed 7)  Acetaminophen .... As Needed 8)  Spiriva Handihaler 18 Mcg Caps (Tiotropium Bromide Monohydrate) .Marland Kitchen.. 1 Inhaled Once Daily 9)  Ventolin Hfa 108 (90 Base) Mcg/act Aers (Albuterol Sulfate) .... 2 Puffs Q4h As Needed Sob  Allergies (verified): 1)  ! Pcn  Past History:  Past Medical History: Last updated: 06/15/2009 Current Problems:  AORTIC INSUFFICIENCY (ICD-424.1) CARDIOMYOPATHY (ICD-425.4) PERIPHERAL EDEMA (ICD-782.3)Chronic lower extremity edema. 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) UNS ADVRS EFF UNS RX MEDICINAL&BIOLOGICAL SBSTNC (ICD-995.20) UMBILICAL HERNIA (ICD-553.1) SEBACEOUS CYST, NECK (ICD-706.2) UNSPECIFIED LABYRINTHITIS (ICD-386.30) DYSPEPSIA, HX OF (ICD-V12.79) SPECIAL SCREENING MALIGNANT NEOPLASM OF PROSTATE (ICD-V76.44) HERPES ZOSTER, HX OF 1996 (ICD-V13.8)  Past Surgical  History: Last updated: Jul 02, 2009 Aortic Valve Replacement: '94 - [employer]  Appendectomy-youth repair of abdominal wall hernia  Family History: Last updated: Jul 02, 2009 father - deceased 80; colon cancer mother - deceased 50; aneurysm rupture - neck., CAD Neg - prostate cancer, DM,   Social History: Last updated: Jul 02, 2009 married '70  1 daughter  '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  Review of Systems       The patient complains of dyspnea on exertion.  The patient denies anorexia, fever, weight loss, weight gain, decreased hearing, hoarseness, chest pain, syncope, peripheral edema, prolonged cough, headaches, hemoptysis, abdominal pain, severe indigestion/heartburn, hematuria, muscle weakness, difficulty walking, abnormal bleeding, and enlarged lymph nodes.    Physical Exam  General:  WNWD white male who looks older than his stated age who is in no distress Head:  normocephalic and atraumatic.  No tinederness to percussion of the frontal or maxillary sinus.  Eyes:  pupils equal and pupils round.  C&S clear Mouth:  no oral lesions, throat is clear. Neck:  supple.   Chest Wall:  no deformities.   Lungs:  normal respiratory effort, no intercostal retractions, and no accessory muscle use.  Expiratory wheezes were appreciated throughout. No dullness or fremitus Heart:  normal rate and regular rhythm.   Abdomen:  soft and normal bowel sounds.   Neurologic:  alert & oriented X3 and gait normal.   Skin:  Sallow complexion. No suspicious lesions face or neck.  Cervical Nodes:  no anterior cervical adenopathy and no posterior cervical adenopathy.   Psych:  Oriented X3, good eye contact, and not anxious appearing.     Impression & Recommendations:  Problem # 1:  PULMONARY NODULE (ICD-518.89) Reviewed all studies and reports with the patient. Explained the work-up and the differential diagnosis. Agreed with the current plan. Reassured him that the probability of a malignant lesion was  low to moderate and that waiting for a follow-up CT was safe. Based on his PFTs and Dr. Earmon Phoenix last note he would be a reasonable candidate for VATS if needed.   Problem # 2:  COPD (ICD-496) Reviewed his PFTs. Educated the patient about the mechanism of disease and the extent of his disease.  Plan - start spirivia           Use albuterol MDI as needed for wheezing, if he has frequent use consider him for long-activing bronchodilator.   His updated medication list for this problem includes:    Spiriva Handihaler 18 Mcg Caps (Tiotropium bromide monohydrate) .Marland Kitchen... 1 inhaled once daily    Ventolin Hfa 108 (90 Base) Mcg/act Aers (Albuterol sulfate) .Marland Kitchen... 2 puffs q4h as needed sob  Problem # 3:  CARDIOMYOPATHY (ICD-425.4) Reviewed Dr. Earmon Phoenix last note.Marland KitchenMarland KitchenMarland KitchenMarland Kitchenpatient seems stable.  Problem # 4:  HYPERTENSION (ICD-401.9)  His updated medication list for this problem includes:    Enalapril Maleate 20 Mg Tabs (Enalapril maleate) .Marland Kitchen... Take 1 tablet by mouth two times a day    Metoprolol Tartrate 100 Mg Tabs (Metoprolol tartrate) .Marland Kitchen... Take one tablet by mouth twice a day    Amlodipine Besylate 10 Mg Tabs (Amlodipine besylate) .Marland Kitchen... Take one tablet by mouth daily  BP today: 138/74  Prior BP: 102/60 (07/31/2009)  Labs Reviewed: K+: 4.4 (07/31/2009) Creat: : 0.7 (07/31/2009)     Reasonable control of blood pressure and normal lab. Continue present medication.   Problem # 5:  DYSLIPIDEMIA (ICD-272.4) due for lab follow-up.  Orders: TLB-Lipid Panel (80061-LIPID)  Addendum - reasonable level but not at goal for patient with known atherosclerosis which would be LDL 80 or less  He may do better resuming statin product, i.e. crestor 10mg  once daily.   Problem # 6:  ALLERGIC RHINITIS CAUSE UNSPECIFIED (ICD-477.9) Patient has symptoms of allergic rhinnitis without evidence of infection.  Plan - trial of loratadine 10mg  once daily.           Cautioned about urinary retention.   Problem # 7:   Preventive Health Care (ICD-V70.0) Given tetnus booster and pneumonia vaccine at today's visit.  Complete Medication List: 1)  Adult Aspirin Ec Low Strength 81 Mg Tbec (Aspirin) .... Take 1 tablet by mouth once a day 2)  Enalapril Maleate 20 Mg Tabs (Enalapril maleate) .... Take 1 tablet by mouth two times a day 3)  Metoprolol Tartrate 100 Mg Tabs (Metoprolol tartrate) .... Take one tablet by mouth twice a day 4)  Coumadin 5 Mg Tabs (Warfarin sodium) .... As directed 5)  Amlodipine Besylate 10 Mg Tabs (Amlodipine besylate) .... Take one tablet by mouth daily 6)  Meclizine Hcl 25 Mg Tabs (Meclizine hcl) .... As needed 7)  Acetaminophen  .... As needed 8)  Spiriva Handihaler 18 Mcg Caps (Tiotropium bromide monohydrate) .Marland Kitchen.. 1 inhaled once daily 9)  Ventolin Hfa 108 (90 Base) Mcg/act Aers (Albuterol sulfate) .... 2 puffs q4h as needed sob  Other Orders: TD Toxoids IM 7 YR + (16109) Pneumococcal Vaccine (60454) Admin 1st Vaccine (09811) Admin of Any Addtl Vaccine (91478) atient: John [name] Note: All result statuses are Final unless otherwise noted.  Tests: (1) Lipid Panel (LIPID)   Cholesterol               165 mg/dL                   2-956     ATP III Classification            Desirable:  < 200 mg/dL                    Borderline High:  200 - 239 mg/dL               High:  > = 240 mg/dL   Triglycerides        [H]  248.0 mg/dL                 2.1-308.6     Normal:  <150 mg/dL     Borderline High:  578 - 199 mg/dL   HDL                  [L]  46.96 mg/dL                 >29.52   VLDL Cholesterol     [H]  49.6 mg/dL                  8.4-13.2  CHO/HDL Ratio:  CHD Risk                             5  Men          Women     1/2 Average Risk     3.4          3.3     Average Risk          5.0          4.4     2X Average Risk          9.6          7.1     3X Average Risk          15.0          11.0                          Tests: (2) Cholesterol LDL - Direct  (DIRLDL)  Cholesterol LDL - Direct                             92.2 mg/dL  Patient Instructions: 1)  Pulmonary nodule- Drs Edwyna Shell and [facility] both feel it is unlikely to be a cancer  but there is definitely a nodule in the base of the lung. I agree with a CT in August. 2)  Lung disease - spirometry reveals severe COPD (restrictive airway disease) and emphysema. The Spiriva may help you to feel better. use the Ventolin HFA as needed for wheezing.  3)  Allergy - the runny nose, post-nasal drainage, occasional ear pain and sore throat are all consistent with allergy sympoms. Plan - try taking loratadine 10mg  once a day to relieve these symptoms. Watch for any severe difficulty making your stream.  4)  cholesterol - will recheck cholestrol panel. Recommendations to follow. Last LDL 98.   Immunizations Administered:  Tetanus Vaccine:    Vaccine Type: Td    Site: right deltoid    Mfr: Sanofi Pasteur    Dose: 0.5 ml    Route: IM    Given by: Ami Bullins CMA    Exp. Date: 03/08/2011    Lot #: [id number]    VIS given: 12/29/06 version given August 20, 2009.  Pneumonia Vaccine:    Vaccine Type: Pneumovax    Site: left deltoid    Mfr: Merck    Dose: 0.5 ml    Route: IM    Given by: Ami Bullins CMA    Exp. Date: 02/09/2011    Lot #: [id number]    VIS given: 09/08/95 version given August 20, 2009.

## 2010-03-14 NOTE — Medication Information (Signed)
Summary: Coumadin Clinic  Anticoagulant Therapy  Managed by: Bethena Midget, RN, BSN Referring MD: [name] PCP: Link Snuffer MD: Daleen Squibb MD, Maisie Fus Indication 1: Aortic Valve Replacement (ICD-V43.3) Indication 2: Aortic Valve Disorder (ICD-424.1) Lab Used: [facility] [place] Site: [address] PT 17.8 INR POC 1.7 INR RANGE 2.5 - 3.5  Dietary changes: no    Health status changes: no    Bleeding/hemorrhagic complications: no    Recent/future hospitalizations: no    Any changes in medication regimen? yes       Details: Lasix doubled  Recent/future dental: no  Any missed doses?: no       Is patient compliant with meds? yes      Comments: Drawn at Pulmonary  Allergies: 1)  ! Pcn  Anticoagulation Management History:      His anticoagulation is being managed by telephone today.  Positive risk factors for bleeding include an age of 18 years or older.  The bleeding index is 'intermediate risk'.  Positive CHADS2 values include History of HTN.  Negative CHADS2 values include Age > 7 years old.  The start date was 09/05/1998.  His last INR was 1.7 ratio.  Prothrombin time is 17.8.  Anticoagulation responsible provider: Daleen Squibb MD, Maisie Fus.  INR POC: 1.7.    Anticoagulation Management Assessment/Plan:      The patient's current anticoagulation dose is Coumadin 5 mg  tabs: Take as directed by coumadin clinic..  The target INR is 2.5-3.5.  The next INR is due 12/03/2009.  Anticoagulation instructions were given to patient.  Results were reviewed/authorized by Bethena Midget, RN, BSN.  He was notified by Bethena Midget, RN, BSN.         Prior Anticoagulation Instructions: INR 1.7  Spoke with Kathie Rhodes, [facility] RN while at pt's home.  Advised to have pt start taking 1/2 tablet (2.5mg ) daily.  Recheck on 11/27/09.  Current Anticoagulation Instructions: INR 1.7 Change dose to 2.5mg s daily except 5mg s on Tuesdays. Recheck in one week.  Spoke with Kathie Rhodes, [facility] advised of dosage  change and verbal orders given to redraw in 1 week.

## 2010-03-14 NOTE — Medication Information (Signed)
Summary: Coumadin Clinic  Anticoagulant Therapy  Managed by: Cloyde Reams, RN, BSN Referring MD: [name] PCP: Link Snuffer MD: Daleen Squibb MD, Maisie Fus Indication 1: Aortic Valve Replacement (ICD-V43.3) Indication 2: Aortic Valve Disorder (ICD-424.1) Lab Used: [facility] Site: [address] INR RANGE 2.5 - 3.5          Comments: Pt having broncoscopy with lung bx on 09/24/09.  Per Dr Delton Coombes pt needs Lovenox bridge prior to procedure.  Pt recieved verbal instructions, as well as written instructions today in office. Cloyde Reams RN  September 18, 2009 4:05 PM   Allergies: 1)  ! Pcn  Anticoagulation Management History:      The patient is taking warfarin and comes in today for perioperative management.  Positive risk factors for bleeding include an age of 75 years or older.  The bleeding index is 'intermediate risk'.  Positive CHADS2 values include History of HTN.  Negative CHADS2 values include Age > 75 years old.  The start date was 09/05/1998.  Anticoagulation responsible provider: Daleen Squibb MD, Maisie Fus.  Exp: 11/2010.    Anticoagulation Management Assessment/Plan:      The patient's current anticoagulation dose is Coumadin 5 mg  tabs: AS DIRECTED.  The target INR is 2.5-3.5.  The next INR is due 09/28/2009.  Anticoagulation instructions were given to patient.  Results were reviewed/authorized by Cloyde Reams, RN, BSN.  He was notified by Cloyde Reams RN.         Prior Anticoagulation Instructions: INR 2.8  Continue taking 1 tablet (5mg ) every day except take 1/2 tablet (2.5mg ) on Wednesday.  Recheck in 4 weeks.   Current Anticoagulation Instructions: Last dose of Coumadin today, no Coumadin or Lovenox tomorrow.  Start Lovenox 90mg  in am on 09/20/09, continue on Lovenox injections every 12 hours.  Last dose of Lovenox 09/23/09 pm dosage.  No Lovenox on 09/24/09 day of procedure.  Resume Coumadin after procedure per MD instruction 1.5 tablets x 2 doses then 1 tablet x 2 doses then  check PT/INR.  Resume Lovenox every 12 hours on 09/25/09 until recheck on 09/28/09.  Prescriptions: ENOXAPARIN SODIUM 100 MG/ML SOLN (ENOXAPARIN SODIUM) Inject 90mg  subcutaneously every 12 hours as instructed  #20 x 0   Entered by:   Cloyde Reams RN   Authorized by:   Leslye Peer MD   Signed by:   Cloyde Reams RN on 09/18/2009   Method used:   Electronically to        [facility] [employer] (retail)       [address]       [place], [place]  [address]       Ph: [phone] or [phone]       Fax: [phone]   RxID:   [id number]

## 2010-03-14 NOTE — Medication Information (Signed)
Summary: Coumadin Clinic  Anticoagulant Therapy  Managed by: Bethena Midget, RN, BSN Referring MD: [name] PCP: Link Snuffer MD: Juanda Chance MD, Bruce Indication 1: Aortic Valve Replacement (ICD-V43.3) Indication 2: Aortic Valve Disorder (ICD-424.1) Lab Used: [facility] [place] Site: [address] PT 28.3 INR POC 2.4 INR RANGE 2.5 - 3.5  Dietary changes: no    Health status changes: no    Bleeding/hemorrhagic complications: no    Recent/future hospitalizations: yes       Details: Discharged home on Tues.   Any changes in medication regimen? yes       Details: Amiodarone discontined at [facility] discharge. Prednisone 40mg s daily until f/u on 12/30  Recent/future dental: no  Any missed doses?: no       Is patient compliant with meds? yes       Allergies: 1)  ! Pcn  Anticoagulation Management History:      His anticoagulation is being managed by telephone today.  Positive risk factors for bleeding include an age of 32 years or older.  The bleeding index is 'intermediate risk'.  Positive CHADS2 values include History of HTN.  Negative CHADS2 values include Age > 34 years old.  The start date was 09/05/1998.  His last INR was 1.7 ratio.  Prothrombin time is 28.3.  Anticoagulation responsible provider: Juanda Chance MD, Smitty Cords.  INR POC: 2.4.    Anticoagulation Management Assessment/Plan:      The patient's current anticoagulation dose is Coumadin 5 mg  tabs: Take as directed by coumadin clinic..  The target INR is 2.5-3.5.  The next INR is due 02/05/2010.  Anticoagulation instructions were given to patient/ [facility].  Results were reviewed/authorized by Bethena Midget, RN, BSN.  He was notified by Bethena Midget, RN, BSN.         Prior Anticoagulation Instructions: INR 3.2  Spoke with Kathie Rhodes with [facility].  Continue same dose of 1/2 tablet every day except 1 tablet on Tuesday, Thursday and Saturday.  Recheck INR in 1 week.   Current Anticoagulation Instructions: INR  2.4 Continue 2.5mg s daily except 5mg s on T,T& Sat. Recheck in 5 days. Orders given to [facility] with [facility] while at home with pt.

## 2010-03-14 NOTE — Medication Information (Signed)
Summary: rov/mlw  Anticoagulant Therapy  Managed by: Shelby Dubin, PharmD Referring MD: [name] PCP: Link Snuffer MD: Ladona Ridgel MD, Sharlot Gowda Indication 1: Aortic Valve Replacement (ICD-V43.3) Indication 2: Aortic Valve Disorder (ICD-424.1) Lab Used: [facility] Site: [facility] INR POC 3.5 INR RANGE 2.5 - 3.5  Dietary changes: no    Health status changes: no    Bleeding/hemorrhagic complications: no    Recent/future hospitalizations: no    Any changes in medication regimen? no    Recent/future dental: no  Any missed doses?: no       Is patient compliant with meds? yes       Allergies: 1)  ! Pcn  Anticoagulation Management History:      The patient is taking warfarin and comes in today for a routine follow up visit.  Positive risk factors for bleeding include an age of 75 years or older.  The bleeding index is 'intermediate risk'.  Positive CHADS2 values include History of HTN.  Negative CHADS2 values include Age > 75 years old.  The start date was 09/05/1998.  Anticoagulation responsible provider: Ladona Ridgel MD, Sharlot Gowda.  INR POC: 3.5.  Cuvette Lot#: [id number].  Exp: 05/2010.    Anticoagulation Management Assessment/Plan:      The patient's current anticoagulation dose is Coumadin 5 mg  tabs: AS DIRECTED.  The target INR is 2.5-3.5.  The next INR is due 03/20/2009.  Anticoagulation instructions were given to patient.  Results were reviewed/authorized by Shelby Dubin, PharmD.  He was notified by Lew Dawes, PharmD Candidate.         Prior Anticoagulation Instructions: INR 2.2  Take 1.5 tabs tonight. Then start taking 1 tab (5 mg) daily.   Recheck on January 14 at 12:15 pm.   Take as directed by Coumadin Clinic.  Current Anticoagulation Instructions: INR 3.5  Take 0.5 tablet tomorrow then continue same dose 1 tablet daily. Recheck in 3-4 weeks.

## 2010-03-14 NOTE — Letter (Signed)
[facility] [address] [place], [place]  [address] Phone: [phone]      May 08, 2009   John Vanpatten [address] [place] [address] [place], [place] [address]  RE:  LAB RESULTS  Dear  Mr. Helling,  The following is an interpretation of your most recent lab tests.  Please take note of any instructions provided or changes to medications that have resulted from your lab work.  ELECTROLYTES:  Good - no changes needed  KIDNEY FUNCTION TESTS:  Good - no changes needed  LIVER FUNCTION TESTS:  Good - no changes needed  LIPID PANEL:  Good - no changes needed Triglyceride: 259.0   Cholesterol: 179   LDL: DEL   HDL: 32.00   Chol/HDL%:  6    Cholesterol level is excellent altough the HDL is below goal. No need for medication at this time. Need to be careful about toatl carbs and need to increase aerobic exercise.  Call or e-mail me if you have questions ([email or web address]).   Sincerely Yours,    Jacques Navy MD  Patient: John [name] Note: All result statuses are Final unless otherwise noted.  Tests: (1) Lipid Panel (LIPID)   Cholesterol               179 mg/dL                   5-638     ATP III Classification            Desirable:  < 200 mg/dL                    Borderline High:  200 - 239 mg/dL               High:  > = 240 mg/dL   Triglycerides        [H]  259.0 mg/dL                 7.5-643.3     Normal:  <150 mg/dL     Borderline High:  295 - 199 mg/dL   HDL                  [L]  18.84 mg/dL                 >16.60   VLDL Cholesterol     [H]  51.8 mg/dL                  6.3-01.6  CHO/HDL Ratio:  CHD Risk                             6                    Men          Women     1/2 Average Risk     3.4          3.3     Average Risk          5.0          4.4     2X Average Risk          9.6          7.1     3X Average Risk          15.0          11.0  Tests: (2) BMP (METABOL)   Sodium                    139 mEq/L                    135-145   Potassium                 4.3 mEq/L                   3.5-5.1   Chloride                  105 mEq/L                   96-112   Carbon Dioxide            28 mEq/L                    19-32   Glucose                   88 mg/dL                    82-95   BUN                       7 mg/dL                     6-21   Creatinine                0.9 mg/dL                   3.0-8.6   Calcium                   9.1 mg/dL                   5.7-84.6   GFR                       87.70 mL/min                >60  Tests: (3) Cholesterol LDL - Direct (DIRLDL)  Cholesterol LDL - Direct                             91.8 mg/dL

## 2010-03-14 NOTE — Medication Information (Signed)
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 yes       Details: Decr Amiodarone to 200mg  daily.   Any missed doses?: no       Is patient compliant with meds? yes       Allergies: 1)  ! Pcn  Anticoagulation Management History:      His anticoagulation is being managed by telephone today.  Positive risk factors for bleeding include an age of 18 years or older.  The bleeding index is 'intermediate risk'.  Positive CHADS2 values include History of HTN.  Negative CHADS2 values include Age > 78 years old.  The start date was 09/05/1998.  His last INR was 6.9.  Prothrombin time is 20.3.  Anticoagulation responsible provider: Myrtis Ser MD, Tinnie Gens.  INR POC: 1.7.  Exp: 11/2010.    Anticoagulation Management Assessment/Plan:      The patient's current anticoagulation dose is Coumadin 5 mg  tabs: Take as directed by coumadin clinic..  The target INR is 2.5-3.5.  The next INR is due 11/27/2009.  Anticoagulation instructions were given to home health nurse.  Results were reviewed/authorized by Cloyde Reams, RN, BSN.  He was notified by Cloyde Reams RN.         Prior Anticoagulation Instructions: INR 6.3 RN to draw veinipuncture sending to [facility] INR results from venopuncture- 6.9  Pt has not taken Coumadin today, will hold today's dosage and will call once lab draw results back.   Spoke with pt's wife.  Hold Coumadin until next check on Friday.  Orders given to [place] at [facility]. Weston Brass PharmD  November 19, 2009 3:19 PM   Current Anticoagulation Instructions: INR 1.7  Spoke with  Kathie Rhodes, [facility] RN while at pt's home.  Advised to have pt start taking 1/2 tablet (2.5mg ) daily.  Recheck on 11/27/09.

## 2010-03-14 NOTE — Medication Information (Signed)
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 no    Recent/future dental: no  Any missed doses?: no       Is patient compliant with meds? yes       Allergies: 1)  ! Pcn  Anticoagulation Management History:      The patient is taking warfarin and comes in today for a routine follow up visit.  Positive risk factors for bleeding include an age of 14 years or older.  The bleeding index is 'intermediate risk'.  Positive CHADS2 values include History of HTN.  Negative CHADS2 values include Age > 2 years old.  The start date was 09/05/1998.  His last INR was 1.7 ratio.  Anticoagulation responsible Shirlette Scarber: Ladona Ridgel MD, Sharlot Gowda.  INR POC: 3.2.  Cuvette Lot#: [id number].  Exp: 01/2011.    Anticoagulation Management Assessment/Plan:      The patient's current anticoagulation dose is Coumadin 5 mg  tabs: Take as directed by coumadin clinic..  The target INR is 2.5-3.5.  The next INR is due 01/29/2010.  Anticoagulation instructions were given to patient/ [facility].  Results were reviewed/authorized by Weston Brass, PharmD.  He was notified by Hoy Register, PharmD Candidate.         Prior Anticoagulation Instructions: INR 2.7  Spoke with Kathie Rhodes with [facility] while in pt's home.  Continue same dose of  1/2 tablet every day except 1 tablet on Tuesday, Thursday and Saturday.  Recheck INR in 2 weeks.   Current Anticoagulation Instructions: INR 3.2 Continue previous dose of 0.5 tablet everyday except 1 tablet Tuesday, Thursday, and Saturday Recheck INR in 4 week

## 2010-03-14 NOTE — Medication Information (Signed)
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 no    Recent/future dental: no  Any missed doses?: no       Is patient compliant with meds? yes       Allergies: 1)  ! Pcn  Anticoagulation Management History:      His anticoagulation is being managed by telephone today.  Positive risk factors for bleeding include an age of 75 years or older.  The bleeding index is 'intermediate risk'.  Positive CHADS2 values include History of HTN.  Negative CHADS2 values include Age > 55 years old.  The start date was 09/05/1998.  His last INR was 1.7 ratio.  Prothrombin time is 31.8.  Anticoagulation responsible Doug Bucklin: Ladona Ridgel MD, Sharlot Gowda.  INR POC: 2.7.  Exp: 11/2010.    Anticoagulation Management Assessment/Plan:      The patient's current anticoagulation dose is Coumadin 5 mg  tabs: Take as directed by coumadin clinic..  The target INR is 2.5-3.5.  The next INR is due 01/07/2010.  Anticoagulation instructions were given to patient/ [facility].  Results were reviewed/authorized by Weston Brass, PharmD.  He was notified by Weston Brass PharmD.         Prior Anticoagulation Instructions: INR 2.4 Change dose to 1/2 pill everyday except 1 pill on T,T& Sat.  Recheck in 12 days. Orders given to [facility] with [facility].   Current Anticoagulation Instructions: INR 2.7  Spoke with Kathie Rhodes with [facility] while in pt's home.  Continue same dose of 1/2 tablet every day except 1 tablet  on Tuesday, Thursday and Saturday.  Recheck INR in 2 weeks.

## 2010-03-14 NOTE — Progress Notes (Signed)
Summary: Fluid in legs and oxygen sats are low   Phone Note Other Incoming Call back at Home Phone [phone]   Caller: [facility]/ Kathie Rhodes [phone] Summary of Call: Pt having fluid in legs and oxygen stats are low  Initial call taken by: Judie Grieve,  November 23, 2009 9:18 AM  Follow-up for Phone Call        I spoke with Kathie Rhodes and she said the pt is retaining fluid in his feet and legs.  Per her assessment the pt has 2-3+ edema.  The pt is taking lasix 40mg  daily.  The pt is also SOB.  The pt did see Dr Edwyna Shell on Tuesday and a chest x-ray was obtained.  Kathie Rhodes said she spoke with Dr Scheryl Darter office about  SOB and the O2 was increased to 4 liters and his sat his 90-94%.  While the pt was on 2 liters the O2 sat was in the 80s.  Per Kathie Rhodes the pt's BP is 120/58, pulse  60 and weight  174. Follow-up by: Julieta Gutting, RN, BSN,  November 23, 2009 9:42 AM  Additional Follow-up for Phone Call Additional follow up Details #1::        I spoke with [facility] and she will discuss this pt with Dr Edwyna Shell.  The pt does not have a Hx of CHF but does have venous insufficiency in the past.  The pt's most recent echo shows an EF of 40-45% while in AFib. Julieta Gutting, RN, BSN  November 23, 2009 9:48 AM  Geraldine Contras spoke with Dr Edwyna Shell and the pt should be evaluated by pulmonary or cardiology for symptoms.  Julieta Gutting, RN, BSN  November 23, 2009 10:36 AM  I spoke with Dr Riley Kill (DOD) and he feels the pt should be evaluated by pulmonary.  He feels that symptoms could be coming from Pulmonary HTN.  I spoke with pulmonary and they will attempt to arrange an OV today for the pt.  I spoke with the pt's wife and she cannot bring the pt in for an appt today. The pt said he does not feel any worse than what he did on Tuesday at Dr Burney's appt.  Pulmonary scheduled the pt to see Rubye Oaks NP on Monday at 11:30.  The pt will also keep his appt next week with Dr Delton Coombes.  The pt's wife is aware of appts. I  advised her to call the on-call staff this weekend if the pt had any problems or go to the ER. She agreed with plan.  Additional Follow-up by: Julieta Gutting, RN, BSN,  November 23, 2009 2:07 PM

## 2010-03-14 NOTE — Medication Information (Signed)
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 no    Recent/future dental: no  Any missed doses?: no       Is patient compliant with meds? yes       Allergies: 1)  ! Pcn  Anticoagulation Management History:      His anticoagulation is being managed by telephone today.  Positive risk factors for bleeding include an age of 64 years or older.  The bleeding index is 'intermediate risk'.  Positive CHADS2 values include History of HTN.  Negative CHADS2 values include Age > 51 years old.  The start date was 09/05/1998.  His last INR was 1.7 ratio.  Prothrombin time is 21.6.  Anticoagulation responsible provider: Shirlee Latch MD, Renisha Cockrum.  INR POC: 1.8.  Exp: 11/2010.    Anticoagulation Management Assessment/Plan:      The patient's current anticoagulation dose is Coumadin 5 mg  tabs: Take as directed by coumadin clinic..  The target INR is 2.5-3.5.  The next INR is due 12/11/2009.  Anticoagulation instructions were given to patient.  Results were reviewed/authorized by Weston Brass, PharmD.  He was notified by Weston Brass PharmD.         Prior Anticoagulation Instructions: INR 1.7 Change dose to 2.5mg s daily except 5mg s on Tuesdays. Recheck in one week.  Spoke with Kathie Rhodes, [facility] advised of dosage change and verbal orders given to redraw in 1 week.   Current Anticoagulation Instructions: INR 1.8  Spoke with Kathie Rhodes with [facility].  Take 5mg  today and tomorrow then increase dose to 2.5mg  daily except 5mg  on Tuesday and  Saturday.  Recheck INR in 1 week.

## 2010-03-14 NOTE — Assessment & Plan Note (Signed)
Summary: NP follow up - post hosp   Copy to:  Dr. [name] Primary Provider/Referring Provider:  Debby Bud  CC:  post hosp follow up - states breathing is doing well.  no new complaints..  History of Present Illness: Pulmonary OV  02/06/10-Presents for a  post hosp follow up. He is feeling so much better. Admitted   01/23/2010- 01/29/2010. He was admitted for  Acute-on-chronic respiratory failure secondary to acute pulmonary     infiltrates superimposed on underlying chronic obstructive  pulmonary disease and known cardiomyopathy:  Suspected to have pneumonitis secondary to amiodarone  toxcity and was given a  steroid trial. Cardiology consult w/ change in meds from amiodarone to metoprolol.   This was further complicated by his     underlying chronic obstructive pulmonary disease with poor  mucociliary clearance following lung cancer and prolonged hospitalization, even further complicated by volume excess in the setting of his cardiomyopathy.  Ultimately, Mr. Gironda was treated with 5 days of empiric Avelox, however,   He seemed to respond nicely to steroid  dosing and discontinuation of his amiodarone.  Discharged  to continue minimally 3-week course of prednisone at 40 mg.  Following that, he will undergo a  slow taper.  He will no longer continue amiodarone given the  concern for toxicity and resultant pneumonitis.   Since discharge he is feeling  better w/ decreased dyspnea and edema. Denies chest pain,  orthopnea, hemoptysis, fever, n/v/d, edema, headache  Medications Prior to Update: 1)  Adult Aspirin Ec Low Strength 81 Mg  Tbec (Aspirin) .... Take 1 Tablet By Mouth Once A Day 2)  Metoprolol Tartrate 100 Mg Tabs (Metoprolol Tartrate) .... Take 1/2 Tablet By Mouth Twice A Day 3)  Coumadin 5 Mg  Tabs (Warfarin Sodium) .... Take As Directed By Coumadin Clinic. 4)  Amiodarone Hcl 200 Mg Tabs (Amiodarone Hcl) .... Take One Tablet By Mouth Daily 5)  Ferrous Sulfate 325 (65 Fe) Mg Tabs  (Ferrous Sulfate) .... Take One  By Mouth Two Times A Day 6)  Potassium Chloride Crys Cr 20 Meq Cr-Tabs (Potassium Chloride Crys Cr) .... Take One Tablet By Mouth Daily 7)  Pepcid 20 Mg Tabs (Famotidine) .... Take One By Mouth Once Daily As Needed 8)  Furosemide 40 Mg Tabs (Furosemide) .... Take One By Mouth Once Two Times A Day. 9)  Ventolin Hfa 108 (90 Base) Mcg/act Aers (Albuterol Sulfate) .... 2 Puffs Q4h As Needed Sob 10)  Ensure  Liqd (Nutritional Supplements) .... Three Times A Day 11)  Oxygen .... Wear 3.5l/min To 4l/min Continuously 12)  Fluticasone Propionate 50 Mcg/act Susp (Fluticasone Propionate) .... 2 Sprays Each Nostril Two Times A Day 13)  Spiriva Handihaler 18 Mcg Caps (Tiotropium Bromide Monohydrate) .... Inhale Contents of 1 Capsule Once A Day  Current Medications (verified): 1)  Adult Aspirin Ec Low Strength 81 Mg  Tbec (Aspirin) .... Take 1 Tablet By Mouth Once A Day 2)  Metoprolol Tartrate 100 Mg Tabs (Metoprolol Tartrate) .... Take 1/2 Tablet By Mouth Twice A Day 3)  Coumadin 5 Mg  Tabs (Warfarin Sodium) .... Take As Directed By Coumadin Clinic. 4)  Ferrous Sulfate 325 (65 Fe) Mg Tabs (Ferrous Sulfate) .... Take One  By Mouth Two Times A Day 5)  Potassium Chloride Crys Cr 20 Meq Cr-Tabs (Potassium Chloride Crys Cr) .... Take One Tablet By Mouth Daily 6)  Pepcid 20 Mg Tabs (Famotidine) .... Take One By Mouth Once Daily As Needed 7)  Furosemide 40 Mg Tabs (  Furosemide) .... Take One By Mouth Once Two Times A Day. 8)  Ventolin Hfa 108 (90 Base) Mcg/act Aers (Albuterol Sulfate) .... 2 Puffs Q4h As Needed Sob 9)  Ensure  Liqd (Nutritional Supplements) .... Three Times A Day As Needed 10)  Oxygen .... Wear 3.5l/min To 4l/min Continuously 11)  Fluticasone Propionate 50 Mcg/act Susp (Fluticasone Propionate) .... 2 Sprays Each Nostril Two Times A Day 12)  Spiriva Handihaler 18 Mcg Caps (Tiotropium Bromide Monohydrate) .... Inhale Contents of 1 Capsule Once A Day 13)  Symbicort  80-4.5 Mcg/act Aero (Budesonide-Formoterol Fumarate) .... Inhale 2 Puffs Two Times A Day 14)  Mucinex 600 Mg Xr12h-Tab (Guaifenesin) .... Take 1-2 Tablets Every 12 Hours As Needed 15)  Prednisone 20 Mg Tabs (Prednisone) .... 2 Tabs By Mouth Once Daily  Allergies (verified): 1)  ! Pcn  Past History:  Past Medical History: Last updated: 01/01/2010 Current Problems:  ATRIAL FIBRILLATION (ICD-427.31) AORTIC INSUFFICIENCY (ICD-424.1) CARDIOMYOPATHY (ICD-425.4) PERIPHERAL EDEMA (ICD-782.3)Chronic lower extremity edema. 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) UNS ADVRS EFF UNS RX MEDICINAL&BIOLOGICAL SBSTNC (ICD-995.20) UMBILICAL HERNIA (ICD-553.1) SEBACEOUS CYST, NECK (ICD-706.2) UNSPECIFIED LABYRINTHITIS (ICD-386.30) DYSPEPSIA, HX OF (ICD-V12.79) SPECIAL SCREENING MALIGNANT NEOPLASM OF PROSTATE (ICD-V76.44) HERPES ZOSTER, HX OF 1996 (ICD-V13.8) BRONCHOGENIC CARCINOMA S/P LOBECTOMY 2011  Past Surgical History: Last updated: 06-18-09 Aortic Valve Replacement: '94 - [employer]  Appendectomy-youth repair of abdominal wall hernia  Family History: Last updated: 06-18-09 father - deceased 53; colon cancer mother - deceased 50; aneurysm rupture - neck., CAD Neg - prostate cancer, DM,   Social History: Last updated: 2009/06/18 married '70  1 daughter  '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  Risk Factors: Smoking Status: quit < 6 months (07/05/2009) Packs/Day: .5 (03/09/2007)  Review of Systems      See HPI  Vital Signs:  Patient profile:   77 year old male Height:      72 inches Weight:      175 pounds BMI:     23.82 O2 Sat:      98 % on 3 L/min cont Temp:     97.0 degrees F oral Pulse rate:   56 / minute BP sitting:   126 / 72  (left arm) Cuff size:    regular  Vitals Entered By: Boone Master CNA/MA (February 06, 2010 9:49 AM)  O2 Flow:  3 L/min cont CC: post hosp follow up - states breathing is doing well.  no new complaints. Is Patient Diabetic? No Comments Medications reviewed with patient Daytime contact number verified with patient. Boone Master CNA/MA  February 06, 2010 9:49 AM    Physical Exam  Additional Exam:  GEN: A/Ox3; pleasant , NAD, chronically ill appearing.  HEENT:  [place]/AT, , EACs-clear, TMs-wnl, NOSE-clear, THROAT-clear NECK:  Supple w/ fair ROM; no JVD; normal carotid impulses w/o bruits; no thyromegaly or nodules palpated; no lymphadenopathy. RESP  Coarse BS w/ no wheezing.  CARD:  RRR, no m/r/g , tr edema  GI:   Soft & nt; nml bowel sounds; no organomegaly or masses detected. [place]co: Warm bil,  no calf tenderness  clubbing, pulses intact Neuro:intact w/ no focal deficits noted.    Impression & Recommendations:  Problem # 1:  PNEUMONITIS (ICD-486)  Recent hospitalization w/ suspected pneumonitis secondary amiodarone toxcity complicated by COPD flare and cardiomyopathy  Now improved with discontinuation of amiodarone and steroid challenge.  Plan:  Remain on Prednisone 20 mg  2 tabs in am w/ food.  Continue on current regimen.  follow up Dr. Delton Coombes in 2 weeks.   Orders: Est. Patient Level III (16109)  Medications Added to Medication List This Visit: 1)  Ensure Liqd (Nutritional supplements) .... Three times a day as needed 2)  Symbicort 80-4.5 Mcg/act Aero (Budesonide-formoterol fumarate) .... Inhale 2 puffs two times a day 3)  Mucinex 600 Mg Xr12h-tab (Guaifenesin) .... Take 1-2 tablets every 12 hours as needed 4)  Prednisone 20 Mg Tabs (Prednisone) .... 2 tabs by mouth once daily  Patient Instructions: 1)  Remain on Prednisone 20 mg 2 tabs in am w/ food.  2)  Continue on current regimen.  3)  follow up Dr. Delton Coombes in 2 weeks.

## 2010-03-14 NOTE — Medication Information (Signed)
Summary: Coumadin Clinic  Anticoagulant Therapy  Managed by: Cloyde Reams, RN, BSN Referring MD: [name] PCP: Link Snuffer MD: Antoine Poche MD, Fayrene Fearing Indication 1: Aortic Valve Replacement (ICD-V43.3) Indication 2: Aortic Valve Disorder (ICD-424.1) Lab Used: [facility] [place] Site: [address] PT 63.3 INR POC 5.3 INR RANGE 2.5 - 3.5    Bleeding/hemorrhagic complications: yes       Details: sl nose bleed   Any changes in medication regimen? yes       Details: Continues on steroid seeing Dr Delton Coombes on 03/04/10.   Any missed doses?: no       Is patient compliant with meds? yes       Allergies: 1)  ! Pcn  Anticoagulation Management History:      His anticoagulation is being managed by telephone today.  Positive risk factors for bleeding include an age of 75 years or older.  The bleeding index is 'intermediate risk'.  Positive CHADS2 values include History of HTN.  Negative CHADS2 values include Age > 8 years old.  The start date was 09/05/1998.  His last INR was 5.9.  Prothrombin time is 63.3.  Anticoagulation responsible provider: Antoine Poche MD, Fayrene Fearing.  INR POC: 5.3.  Exp: 03/2011.    Anticoagulation Management Assessment/Plan:      The patient's current anticoagulation dose is Coumadin 5 mg  tabs: Take as directed by coumadin clinic..  The target INR is 2.5-3.5.  The next INR is due 03/01/2010.  Anticoagulation instructions were given to spouse.  Results were reviewed/authorized by Cloyde Reams, RN, BSN.  He was notified by Cloyde Reams RN.         Prior Anticoagulation Instructions: INR 5.9 Skip today, pt skipped last night's dose then change dose to 2.5mg s daily except 5mg s on Tues and Thurs. Recheck INR in one week. Orders given to [facility] on Red team at [facility].   Current Anticoagulation Instructions: INR 5.3  Spoke with Kathie Rhodes, [facility] while at pt's home. Advised to Skip today's and tomorrow's dosage of Coumadin, then start taking 2.5mg  daily.  Recheck in  1 week.

## 2010-03-14 NOTE — Assessment & Plan Note (Signed)
Summary: COPD, pulm nodule   Visit Type:  Follow-up Copy to:  Dr. [name] Primary Provider/Referring Provider:  Debby Bud  CC:  The patient is here for follow-up . He says there is no change in SOB...no better or worse.  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 Referred for RLL nodule identified by CXR and CT scan of the chest in 4/11.  He tells me that he was told many yrs ago (as long ago as age 23!) that he has had some abnormality on CXR in the R lung.   Originally had flu-like signs in February/March, treated for PNA by an Urgent Care. Clinically improved with Pred + abx. Repeat CXR in 4/11 showed ? RLL nodule, which prompted the CT scan and the PET scan.  The PET on that showed uptake below the malignant range at 1.3, although there was motion degradation of the study. He was referred to Dr Edwyna Shell on May 18 for eval, also referred here to assess. He has also had spirometry done at Urgent care in [place] - per Dr Scheryl Darter notes: FEV1  0.97, FVC 1.81L. Now in a position where we need to decide regarding bx/resection vs watch and wait x 3 months with repeat imaging. he tells me that he is feeling well, no dyspnea, cough, hemoptysis.   ROV 07/31/09 -- He has seen Dr Edwyna Shell regarding most recent PFTs, shows FEV1 1.6L. CT scan was in 4/11, next one needs to be in August (3 months after PET). C/o severe exertional SOB.   Current Medications (verified): 1)  Adult Aspirin Ec Low Strength 81 Mg  Tbec (Aspirin) .... Take 1 Tablet By Mouth Once A Day 2)  Enalapril Maleate 20 Mg  Tabs (Enalapril Maleate) .... Take 1 Tablet By Mouth Two Times A Day 3)  Metoprolol Tartrate 100 Mg Tabs (Metoprolol Tartrate) .... Take One Tablet By Mouth Twice A Day 4)  Coumadin 5 Mg  Tabs (Warfarin Sodium) .... As Directed 5)  Amlodipine Besylate 10 Mg Tabs (Amlodipine Besylate) .... Take One Tablet By Mouth Daily 6)  Meclizine Hcl 25 Mg Tabs (Meclizine  Hcl) .... As Needed 7)  Acetaminophen .... As Needed  Allergies (verified): 1)  ! Pcn  Vital Signs:  Patient profile:   77 year old male Height:      72 inches (182.88 cm) Weight:      190 pounds (86.36 kg) BMI:     25.86 O2 Sat:      95 % on Room air Temp:     97.7 degrees F (36.50 degrees C) oral Pulse rate:   65 / minute BP sitting:   102 / 60  (left arm) Cuff size:   regular  Vitals Entered By: Michel Bickers CMA (July 31, 2009 2:14 PM)  O2 Sat at Rest %:  95 O2 Flow:  Room air CC: The patient is here for follow-up . He says there is no change in SOB...no better or worse.   Comments Medications reviewed. Daytime phone verified. Michel Bickers CMA  July 31, 2009 2:15 PM   Physical Exam  General:  normal appearance and healthy appearing.   Head:  normocephalic and atraumatic Nose:  no deformity, discharge, inflammation, or lesions Mouth:  no deformity or lesions Neck:  no masses, thyromegaly, or abnormal cervical nodes Lungs:  clear bilaterally to auscultation and percussion Heart:  regular rhythm and normal rate.  3/6 systolic M Abdomen:  not examined Msk:  no deformity or scoliosis noted with normal posture Extremities:  no edema Neurologic:  non-focal Skin:  intact without lesions or rashes Psych:  alert and cooperative; normal mood and affect; normal attention span and concentration   Impression & Recommendations:  Problem # 1:  PULMONARY NODULE (ICD-518.89)  Will need repeat scan in August, then f/u. If stable then will repeat in 6 mo, if enlarging then will send him back to Dr Edwyna Shell regarding possible segmentectomy.   Orders: Est. Patient Level IV ([address]) Prescription Created Electronically [phone]) Radiology Referral (Radiology)  Problem # 2:  COPD (ICD-496) start Spiriva + SABA  Medications Added to Medication List This Visit: 1)  Spiriva Handihaler 18 Mcg Caps (Tiotropium bromide monohydrate) .John [name].. 1 inhaled once daily 2)  Ventolin Hfa 108 (90 Base)  Mcg/act Aers (Albuterol sulfate) .... 2 puffs q4h as needed sob  Patient Instructions: 1)  We will repeat your Ct scan of the chest in August 2)  We will start Spiriva 1 inhalation once daily 3)  Use 2 puffs up to every 4 hours if neded for shortness of breath 4)  Follow up in August after your CT scan is completed Prescriptions: VENTOLIN HFA 108 (90 BASE) MCG/ACT AERS (ALBUTEROL SULFATE) 2 puffs q4h as needed sob  #1 x 5   Entered and Authorized by:   Leslye Peer MD   Signed by:   Leslye Peer MD on 07/31/2009   Method used:   Electronically to        [facility] (retail)       [address]       [place], [place]  [address]       Ph: [phone] or [phone]       Fax: [phone]   RxID:   [id number] SPIRIVA HANDIHALER 18 MCG CAPS (TIOTROPIUM BROMIDE MONOHYDRATE) 1 inhaled once daily  #30 x 5   Entered and Authorized by:   Leslye Peer MD   Signed by:   Leslye Peer MD on 07/31/2009   Method used:   Electronically to        [facility] (retail)       [address]       [place], [place]  [address]       Ph: [phone] or [phone]       Fax: [phone]   RxID:   [id number]   Appended Document: COPD, pulm nodule     Clinical Lists Changes  Orders: Added new Test order of TLB-BMP (Basic Metabolic Panel-BMET) (80048-METABOL) - Signed

## 2010-03-14 NOTE — Medication Information (Signed)
Summary: Coumadin Clinic  Anticoagulant Therapy  Managed by: Weston Brass, PharmD Referring MD: [name] PCP: Link Snuffer MD: Antoine Poche MD, Fayrene Fearing Indication 1: Aortic Valve Replacement (ICD-V43.3) Indication 2: Aortic Valve Disorder (ICD-424.1) Lab Used: [facility] [place] Site: [address] PT 75.7 INR POC 6.3 INR RANGE 2.5 - 3.5  Dietary changes: no    Health status changes: no    Bleeding/hemorrhagic complications: yes       Details: L nare epitaxisis, pt wears oxygen   Any changes in medication regimen? yes       Details: took tylenol; decreased amiodarone to once a day   Any missed doses?: no       Is patient compliant with meds? yes       Allergies: 1)  ! Pcn  Anticoagulation Management History:      His anticoagulation is being managed by telephone today.  Positive risk factors for bleeding include an age of 75 years or older.  The bleeding index is 'intermediate risk'.  Positive CHADS2 values include History of HTN.  Negative CHADS2 values include Age > 75 years old.  The start date was 09/05/1998.  Today's INR is 6.9.  Prothrombin time is 69.7.  Anticoagulation responsible Maximos Zayas: Antoine Poche MD, Fayrene Fearing.  INR POC: 6.3.  Exp: 11/2010.    Anticoagulation Management Assessment/Plan:      The patient's current anticoagulation dose is Coumadin 5 mg  tabs: AS DIRECTED.  The target INR is 2.5-3.5.  The next INR is due 11/23/2009.  Anticoagulation instructions were given to home health nurse.  Results were reviewed/authorized by Weston Brass, PharmD.  He was notified by Weston Brass PharmD.         Prior Anticoagulation Instructions: INR 3.4 Change dose to 2.5mg s daily except 5mg s on Sundays. Orders given to [facility] while at home.   Current Anticoagulation Instructions: INR 6.3 RN to draw veinipuncture sending to [facility] INR results from venopuncture- 6.9  Pt has not taken Coumadin today, will hold today's dosage and will call once  lab draw results back.   Spoke with pt's wife.  Hold Coumadin until next check on Friday.  Orders given to [place] [facility]. Weston Brass PharmD  November 19, 2009 3:19 PM

## 2010-03-15 ENCOUNTER — Encounter: Payer: Self-pay | Admitting: Adult Health

## 2010-03-15 ENCOUNTER — Inpatient Hospital Stay (HOSPITAL_COMMUNITY)
Admission: AD | Admit: 2010-03-15 | Discharge: 2010-03-20 | DRG: 196 | Disposition: A | Discharge status: Home / Self Care | Payer: Medicare Other | Source: Ambulatory Visit | Attending: Pulmonary Disease | Admitting: Pulmonary Disease

## 2010-03-15 ENCOUNTER — Emergency Department (HOSPITAL_COMMUNITY): Payer: Medicare Other

## 2010-03-15 ENCOUNTER — Ambulatory Visit (INDEPENDENT_AMBULATORY_CARE_PROVIDER_SITE_OTHER): Payer: Medicare Other | Admitting: Adult Health

## 2010-03-15 DIAGNOSIS — T462X5A Adverse effect of other antidysrhythmic drugs, initial encounter: Secondary | ICD-10-CM | POA: Diagnosis present

## 2010-03-15 DIAGNOSIS — J189 Pneumonia, unspecified organism: Secondary | ICD-10-CM

## 2010-03-15 DIAGNOSIS — J8409 Other alveolar and parieto-alveolar conditions: Principal | ICD-10-CM | POA: Diagnosis present

## 2010-03-15 DIAGNOSIS — Z87891 Personal history of nicotine dependence: Secondary | ICD-10-CM

## 2010-03-15 DIAGNOSIS — R791 Abnormal coagulation profile: Secondary | ICD-10-CM | POA: Diagnosis not present

## 2010-03-15 DIAGNOSIS — Z85118 Personal history of other malignant neoplasm of bronchus and lung: Secondary | ICD-10-CM

## 2010-03-15 DIAGNOSIS — R0602 Shortness of breath: Secondary | ICD-10-CM | POA: Diagnosis present

## 2010-03-15 DIAGNOSIS — I4891 Unspecified atrial fibrillation: Secondary | ICD-10-CM | POA: Diagnosis present

## 2010-03-15 DIAGNOSIS — Z9981 Dependence on supplemental oxygen: Secondary | ICD-10-CM

## 2010-03-15 DIAGNOSIS — Z954 Presence of other heart-valve replacement: Secondary | ICD-10-CM

## 2010-03-15 DIAGNOSIS — J9600 Acute respiratory failure, unspecified whether with hypoxia or hypercapnia: Secondary | ICD-10-CM | POA: Diagnosis present

## 2010-03-15 DIAGNOSIS — J4489 Other specified chronic obstructive pulmonary disease: Secondary | ICD-10-CM | POA: Diagnosis present

## 2010-03-15 DIAGNOSIS — I10 Essential (primary) hypertension: Secondary | ICD-10-CM | POA: Diagnosis present

## 2010-03-15 DIAGNOSIS — J449 Chronic obstructive pulmonary disease, unspecified: Secondary | ICD-10-CM | POA: Diagnosis present

## 2010-03-15 DIAGNOSIS — I428 Other cardiomyopathies: Secondary | ICD-10-CM | POA: Diagnosis present

## 2010-03-15 LAB — STREP PNEUMONIAE URINARY ANTIGEN: Strep Pneumo Urinary Antigen: NEGATIVE

## 2010-03-15 LAB — URINALYSIS, ROUTINE W REFLEX MICROSCOPIC
Bilirubin Urine: NEGATIVE
Hgb urine dipstick: NEGATIVE
Specific Gravity, Urine: 1.023 (ref 1.005–1.030)
Urine Glucose, Fasting: NEGATIVE mg/dL
Urobilinogen, UA: 1 mg/dL (ref 0.0–1.0)
pH: 7 (ref 5.0–8.0)

## 2010-03-16 DIAGNOSIS — Z85118 Personal history of other malignant neoplasm of bronchus and lung: Secondary | ICD-10-CM

## 2010-03-16 DIAGNOSIS — J168 Pneumonia due to other specified infectious organisms: Secondary | ICD-10-CM

## 2010-03-16 DIAGNOSIS — I4891 Unspecified atrial fibrillation: Secondary | ICD-10-CM

## 2010-03-16 DIAGNOSIS — J441 Chronic obstructive pulmonary disease with (acute) exacerbation: Secondary | ICD-10-CM

## 2010-03-16 LAB — CBC
Hemoglobin: 11.2 g/dL — ABNORMAL LOW (ref 13.0–17.0)
MCHC: 31.5 g/dL (ref 30.0–36.0)
RBC: 4.07 MIL/uL — ABNORMAL LOW (ref 4.22–5.81)
WBC: 11.8 10*3/uL — ABNORMAL HIGH (ref 4.0–10.5)

## 2010-03-16 LAB — PROTIME-INR: Prothrombin Time: 23.3 seconds — ABNORMAL HIGH (ref 11.6–15.2)

## 2010-03-16 LAB — COMPREHENSIVE METABOLIC PANEL
Albumin: 3 g/dL — ABNORMAL LOW (ref 3.5–5.2)
Alkaline Phosphatase: 54 U/L (ref 39–117)
BUN: 18 mg/dL (ref 6–23)
GFR calc Af Amer: >60 mL/min (ref >60)
Potassium: 4.6 mEq/L (ref 3.5–5.1)
Sodium: 136 mEq/L (ref 135–145)
Total Protein: 7.1 g/dL (ref 6.0–8.3)

## 2010-03-16 LAB — URINE CULTURE

## 2010-03-16 LAB — PROCALCITONIN: Procalcitonin: <0.1 ng/mL

## 2010-03-16 LAB — DIFFERENTIAL
Basophils Absolute: 0 10*3/uL (ref 0.0–0.1)
Eosinophils Relative: 0 % (ref 0–5)
Lymphocytes Relative: 10 % — ABNORMAL LOW (ref 12–46)
Neutro Abs: 10.6 10*3/uL — ABNORMAL HIGH (ref 1.7–7.7)
Neutrophils Relative %: 89 % — ABNORMAL HIGH (ref 43–77)

## 2010-03-16 LAB — IRON AND TIBC
Saturation Ratios: 23 % (ref 20–55)
UIBC: 182 ug/dL

## 2010-03-16 LAB — EXPECTORATED SPUTUM ASSESSMENT W GRAM STAIN, RFLX TO RESP C

## 2010-03-16 LAB — BRAIN NATRIURETIC PEPTIDE: Pro B Natriuretic peptide (BNP): 185 pg/mL — ABNORMAL HIGH (ref 0.0–100.0)

## 2010-03-17 ENCOUNTER — Inpatient Hospital Stay (HOSPITAL_COMMUNITY): Payer: Medicare Other

## 2010-03-17 LAB — LEGIONELLA ANTIGEN, URINE

## 2010-03-17 LAB — PROTIME-INR
INR: 2.36 — ABNORMAL HIGH (ref 0.00–1.49)
Prothrombin Time: 25.9 seconds — ABNORMAL HIGH (ref 11.6–15.2)

## 2010-03-18 DIAGNOSIS — J679 Hypersensitivity pneumonitis due to unspecified organic dust: Secondary | ICD-10-CM

## 2010-03-18 LAB — PROTIME-INR: INR: 3.97 — ABNORMAL HIGH (ref 0.00–1.49)

## 2010-03-19 LAB — PROTIME-INR
INR: 5.17 (ref 0.00–1.49)
Prothrombin Time: 47.4 seconds — ABNORMAL HIGH (ref 11.6–15.2)

## 2010-03-20 ENCOUNTER — Inpatient Hospital Stay (HOSPITAL_COMMUNITY): Payer: Medicare Other

## 2010-03-20 LAB — BASIC METABOLIC PANEL
Calcium: 9.1 mg/dL (ref 8.4–10.5)
GFR calc Af Amer: >60 mL/min (ref >60)
GFR calc non Af Amer: >60 mL/min (ref >60)
Sodium: 138 mEq/L (ref 135–145)

## 2010-03-20 LAB — CBC
Platelets: 313 10*3/uL (ref 150–400)
RBC: 4.08 MIL/uL — ABNORMAL LOW (ref 4.22–5.81)
WBC: 13.5 10*3/uL — ABNORMAL HIGH (ref 4.0–10.5)

## 2010-03-20 LAB — PROTIME-INR
INR: 3.43 — ABNORMAL HIGH (ref 0.00–1.49)
Prothrombin Time: 34.6 seconds — ABNORMAL HIGH (ref 11.6–15.2)

## 2010-03-20 LAB — SEDIMENTATION RATE: Sed Rate: 31 mm/hr — ABNORMAL HIGH (ref 0–16)

## 2010-03-20 NOTE — Letter (Signed)
[facility]: [facility]   Imported By: Marylou Mccoy 03/14/2010 12:01:35  _____________________________________________________________________  External Attachment:    Type:   Image     Comment:   External Document

## 2010-03-20 NOTE — Miscellaneous (Signed)
Summary: SN Order / [facility]  SN Order / [facility]   Imported By: Lennie Odor 03/11/2010 14:16:53  _____________________________________________________________________  External Attachment:    Type:   Image     Comment:   External Document

## 2010-03-20 NOTE — Progress Notes (Signed)
----   Converted from flag ---- Flag Received...;KDM  ---- 03/14/2010 12:40 PM, Bary Leriche wrote: Cresenciano Lick, I am sending Dr. Excell Seltzer an [facility] form in his patient John [name] dob. 06/04/1933 to review.   Thank You, Elease Hashimoto @ [employer] ------------------------------

## 2010-03-20 NOTE — Assessment & Plan Note (Signed)
Summary: sob / hp office///kp   Visit Type:  NP acute visit Copy to:  Dr. [name] Primary Provider/Referring Provider:  Debby Bud  CC:  Pt c/o increased SOB and producitve cough with clear to white mucus. Pt states is taking Prednisone 10 mg.  History of Present Illness: 77 yo former smoker with COPD, St Jude's AVR for AI in 1994 (on warfarin). RLL nodule dx as NSCLCA in 09/2009 s/p lobectomy Dr Edwyna Shell. Course c/b hydroPTX/hemothorax.  Possible amio toxicity 01/2010-steroid responsive.  03/04/10>>Admitted 12/11 for acute resp failure, infiltrates. 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 Diuresed, treated with steroids. His breathing is much better since discharge. Otilio Carpen gets epistaxis. No longer doing vibration vest. Currently on Pred 20mg  once daily. Inhaled meds include Spiriva, Symbicort. Wearing 2L/min. exertional tolerance is good.   March 12, 2010 --Presents for an acute office visit. Complains of  increased SOB, producitve cough with clear to white mucus for last 4days. Has had fever tmax 100.7. Gets very winded with any activity.On arrival to office today with sats 79% on pulsing O2 at 2-3 l/m. He was placed on continuous flow at 4 l/m with rebound to93-94%. Last ov steroids decreased.. Pt states is taking Prednisone 10 mg.  Cough is getting worse and feel weak. Denies chest pain,  orthopnea, hemoptysis,  n/v/d, edema, headache.  Preventive Screening-Counseling & Management  Alcohol-Tobacco     Alcohol drinks/day: 0     Smoking Status: quit < 6 months     Smoking Cessation Counseling: no     Packs/Day: .5     Year Quit: March 2011     Pack years: 25-30 years x1 ppd  Caffeine-Diet-Exercise     Caffeine use/day: 5+     Does Patient Exercise: no  Medications Prior to Update: 1)  Adult Aspirin Ec Low Strength 81 Mg  Tbec (Aspirin) .... Take 1 Tablet By Mouth Once A Day 2)  Metoprolol Tartrate 100 Mg Tabs (Metoprolol Tartrate) .... Take 1/2 Tablet By Mouth Twice A  Day 3)  Coumadin 5 Mg  Tabs (Warfarin Sodium) .... Take As Directed By [facility]madin Clinic. 4)  Ferrous Sulfate 325 (65 Fe) Mg Tabs (Ferrous Sulfate) .... Take One  By Mouth Two Times A Day 5)  Potassium Chloride Crys Cr 20 Meq Cr-Tabs (Potassium Chloride Crys Cr) .... Take One Tablet By Mouth Daily 6)  Pepcid 20 Mg Tabs (Famotidine) .... Take One By Mouth Once Daily As Needed 7)  Furosemide 40 Mg Tabs (Furosemide) .... Take One By Mouth Once Two Times A Day. 8)  Ventolin Hfa 108 (90 Base) Mcg/act Aers (Albuterol Sulfate) .... 2 Puffs Q4h As Needed Sob 9)  Ensure  Liqd (Nutritional Supplements) .... Three Times A Day As Needed 10)  Oxygen .... Wear 3.5l/min To 4l/min Continuously 11)  Fluticasone Propionate 50 Mcg/act Susp (Fluticasone Propionate) .... 2 Sprays Each Nostril Two Times A Day 12)  Spiriva Handihaler 18 Mcg Caps (Tiotropium Bromide Monohydrate) .... Inhale Contents of 1 Capsule Once A Day 13)  Symbicort 80-4.5 Mcg/act Aero (Budesonide-Formoterol Fumarate) .... Inhale 2 Puffs Two Times A Day 14)  Mucinex 600 Mg Xr12h-Tab (Guaifenesin) .... Take 1-2 Tablets Every 12 Hours As Needed 15)  Prednisone 20 Mg Tabs (Prednisone) .Marland Kitchen.. 1 By Mouth Daily 16)  Prednisone 10 Mg Tabs (Prednisone) .Marland Kitchen.. 1 By Mouth Once Daily X 1 Week  Current Medications (verified): 1)  Adult Aspirin Ec Low Strength 81 Mg  Tbec (Aspirin) .... Take 1 Tablet By Mouth Once A Day 2)  Metoprolol Tartrate 100 Mg Tabs (Metoprolol Tartrate) .... Take 1/2 Tablet By Mouth Twice A Day 3)  Coumadin 5 Mg  Tabs (Warfarin Sodium) .... Take As Directed By Coumadin Clinic. 4)  Ferrous Sulfate 325 (65 Fe) Mg Tabs (Ferrous Sulfate) .... Take One  By Mouth Two Times A Day 5)  Potassium Chloride Crys Cr 20 Meq Cr-Tabs (Potassium Chloride Crys Cr) .... Take One Tablet By Mouth Daily 6)  Pepcid 20 Mg Tabs (Famotidine) .... Take One By Mouth Once Daily As Needed 7)  Furosemide 40 Mg Tabs (Furosemide) .... Take One By Mouth Once Two Times A  Day. 8)  Ventolin Hfa 108 (90 Base) Mcg/act Aers (Albuterol Sulfate) .... 2 Puffs Q4h As Needed Sob 9)  Ensure  Liqd (Nutritional Supplements) .... Three Times A Day As Needed 10)  Oxygen .... Wear 3.5l/min To 4l/min Continuously 11)  Spiriva Handihaler 18 Mcg Caps (Tiotropium Bromide Monohydrate) .... Inhale Contents of 1 Capsule Once A Day 12)  Symbicort 80-4.5 Mcg/act Aero (Budesonide-Formoterol Fumarate) .... Inhale 2 Puffs Two Times A Day 13)  Mucinex 600 Mg Xr12h-Tab (Guaifenesin) .... Take 2 Tablets Every 12 Hours 14)  Prednisone 10 Mg Tabs (Prednisone) .Marland Kitchen.. 1 By Mouth Once Daily X 1 Week 15)  Tylenol Extra Strength 500 Mg Tabs (Acetaminophen) .... Per Bottle As Needed  Allergies (verified): 1)  ! Pcn  Past History:  Past Medical History: Last updated: 01/01/2010 Current Problems:  ATRIAL FIBRILLATION (ICD-427.31) AORTIC INSUFFICIENCY (ICD-424.1) CARDIOMYOPATHY (ICD-425.4) PERIPHERAL EDEMA (ICD-782.3)Chronic lower extremity edema. 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) UNS ADVRS EFF UNS RX MEDICINAL&BIOLOGICAL SBSTNC (ICD-995.20) UMBILICAL HERNIA (ICD-553.1) SEBACEOUS CYST, NECK (ICD-706.2) UNSPECIFIED LABYRINTHITIS (ICD-386.30) DYSPEPSIA, HX OF (ICD-V12.79) SPECIAL SCREENING MALIGNANT NEOPLASM OF PROSTATE (ICD-V76.44) HERPES ZOSTER, HX OF 1996 (ICD-V13.8) BRONCHOGENIC CARCINOMA S/P LOBECTOMY 2011  Past Surgical History: Last updated: 06/30/09 Aortic Valve Replacement: '94 - St Jude's  Appendectomy-youth repair of abdominal wall hernia  Family History: Last updated: 06/30/09 father - deceased 60; colon cancer mother - deceased 65; aneurysm rupture - neck., CAD Neg - prostate cancer, DM,   Social History: Last updated: Jun 30, 2009 married '70  1 daughter   '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  Risk Factors: Smoking Status: quit < 6 months (03/12/2010) Packs/Day: .5 (03/12/2010)  Review of Systems      See HPI  Vital Signs:  Patient profile:   77 year old male Height:      72 inches Weight:      179 pounds BMI:     24.36 O2 Sat:      93 % on 4 L/min Temp:     98.5 degrees F oral Pulse rate:   74 / minute BP sitting:   138 / 90  (left arm) Cuff size:   regular  Vitals Entered By: Zackery Barefoot CMA (March 12, 2010 2:18 PM)  O2 Flow:  4 L/min  O2 Sat Comments Upon arrival pt's O2 79% 3 liters pulsed P-79. Placed pt on office tank recovered to 93% 4 liters after 3 to 4 minutes resting. Zackery Barefoot CMA  March 12, 2010 2:23 PM  CC: Pt c/o increased SOB, producitve cough with clear to white mucus. Pt states is taking Prednisone 10 mg Comments Medications reviewed with patient Verified contact number and pharmacy with patient Zackery Barefoot CMA  March 12, 2010 2:19 PM    Physical Exam  Additional Exam:  GEN: A/Ox3;  pleasant , NAD, chronically ill appearing.  HEENT:  [place]/AT, , EACs-clear, TMs-wnl, NOSE-clear, THROAT-clear NECK:  Supple w/ fair ROM; no JVD; normal carotid impulses w/o bruits; no thyromegaly or nodules palpated; no lymphadenopathy. RESP  Coarse BS , diminshed in bases , speaking in full sentences, no accessory use CARD:  RRR, no m/r/g , tr edema  GI:   Soft & nt; nml bowel sounds; no organomegaly or masses detected. [place]: Warm bil,  no calf tenderness  clubbing, pulses intact Neuro:intact w/ no focal deficits noted.    Impression & Recommendations:  Problem # 1:  COPD (ICD-496) Exacerbation , will tx w/ abx and mild bump in steoidds. w/ close follow up  xray pending.  advised to avoid demand o2 settting, use continuous flow only Plan:  Avelox 400mg  once daily for 7 days Mucinex DM two times a day as needed cough, congeston  Increase Prednisone 20mg  once daily until seen back in office in 3  days Increase fluids and rest.  Increase Oxygen 4 l/m continuous flow with walking, avoid demand/pulsing on tank.  I will call with xray results.  follow up 3 days in [place] office  Please contact office for sooner follow up if symptoms do not improve or worsen   Medications Added to Medication List This Visit: 1)  Mucinex 600 Mg Xr12h-tab (Guaifenesin) .... Take 2 tablets every 12 hours 2)  Tylenol Extra Strength 500 Mg Tabs (Acetaminophen) .... Per bottle as needed 3)  Avelox 400 Mg Tabs (Moxifloxacin hcl) .Marland Kitchen.. 1 by mouth once daily 4)  Prednisone 10 Mg Tabs (Prednisone) .... 2 by mouth once daily x 1 week , then 1 by mouth once daily x 4 days then 1/2 once daily x 4 days then stop  Other Orders: T-2 View CXR (71020TC) Est. Patient Level IV (11914)  Patient Instructions: 1)  Avelox 400mg  once daily for 7 days 2)  Mucinex DM two times a day as needed cough, congeston  3)  Increase Prednisone 20mg  once daily until seen back in office in 3 days 4)  Increase fluids and rest.  5)  Increase Oxygen 4 l/m continuous flow with walking, avoid demand/pulsing on tank.  6)  I will call with xray results.  7)  follow up 3 days in [place] office  8)  Please contact office for sooner follow up if symptoms do not improve or worsen  Prescriptions: PREDNISONE 10 MG TABS (PREDNISONE) 2 by mouth once daily x 1 week , then 1 by mouth once daily x 4 days then 1/2 once daily x 4 days then stop  #21 x 0   Entered and Authorized by:   Rubye Oaks NP   Signed by:   Elery Cadenhead NP on 03/12/2010   Method used:   Electronically to        [facility] (retail)       [address]       [place], [place]  [address]       Ph: [phone] or [phone]       Fax: [phone]   RxID:   [id number] AVELOX 400 MG TABS (MOXIFLOXACIN HCL) 1 by mouth once daily  #5 x 0   Entered and Authorized by:   Rubye Oaks NP   Signed by:   Randal Yepiz NP on  03/12/2010   Method used:   Electronically to        [facility] (retail)       [address]       [place], [place]  [address]       Ph: [phone] or [phone]       Fax: [phone]   RxID:   [id number]

## 2010-03-20 NOTE — Assessment & Plan Note (Signed)
Summary: HOSPITAL ADMISSION   Vital Signs:  Patient profile:   77 year old male Height:      72 inches Weight:      181 pounds BMI:     24.64 O2 Sat:      93 % on 4 L/min cont Temp:     97.8 degrees F oral Pulse rate:   80 / minute BP sitting:   130 / 70  (left arm) Cuff size:   regular  Vitals Entered By: Boone Master CNA/MA (March 15, 2010 11:35 AM)  O2 Flow:  4 L/min cont CC: 3 day follow up PNA - states "feels about the same." Is Patient Diabetic? No Comments Medications reviewed with patient Daytime contact number verified with patient. Boone Master CNA/MA  March 15, 2010 11:35 AM    Copy to:  Dr. [name] Primary Provider/Referring Provider:  Debby Bud  CC:  3 day follow up PNA - states "feels about the same.".  History of Present Illness: HOSPITAL ADMIT----------------HPI -----------LEAVE IN FRONT OF MD PROGRESS NOTE SECTION  77 yo former smoker with COPD, [employer] Jude[employer] AVR for AI in 1994 (on warfarin). RLL nodule dx as NSCLCA in 09/2009 s/p lobectomy Dr Edwyna Shell. Course c/b hydroPTX/hemothorax.  Possible amio toxicity 01/2010-steroid responsive.  03/04/10>>Admitted 12/11 for acute resp failure, infiltrates. 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 Diuresed, treated with steroids. His breathing is much better since discharge. Otilio Carpen gets epistaxis. No longer doing vibration vest. Currently on Pred 20mg  once daily. Inhaled meds include Spiriva, Symbicort. Wearing 2L/min. exertional tolerance is good.   March 12, 2010 --Presents for an acute office visit. Complains of  increased SOB, producitve cough with clear to white mucus for last 4days. Has had fever tmax 100.7. Gets very winded with any activity.On arrival to office today with sats 79% on pulsing O2 at 2-3 l/m. He was placed on continuous flow at 4 l/m with rebound to93-94%. Last ov steroids decreased.. Pt states is taking Prednisone 10 mg.  Cough is getting worse and feel weak  March 15, 2010  --Presents for acute office visit. Returns today after seen 3 days ago for AECOPD and  RLL PNA on cxr. Tx w/  Avelox and Prednisone. Pt returns today no better, on arrival today his initial O2 sat was 69% on 2l/m continuous flow. PT is very weak , wears out with minimal actiivty. He will require admission for further evaluation and tx. Conitnues to have  low grade fevers. Has good appetite. No increased edema.Denies chest pain,  orthopnea, hemoptysis,  n/v/d, headache.    Medications Prior to Update: 1)  Adult Aspirin Ec Low Strength 81 Mg  Tbec (Aspirin) .... Take 1 Tablet By Mouth Once A Day 2)  Metoprolol Tartrate 100 Mg Tabs (Metoprolol Tartrate) .... Take 1/2 Tablet By Mouth Twice A Day 3)  Coumadin 5 Mg  Tabs (Warfarin Sodium) .... Take As Directed By Coumadin Clinic. 4)  Ferrous Sulfate 325 (65 Fe) Mg Tabs (Ferrous Sulfate) .... Take One  By Mouth Two Times A Day 5)  Potassium Chloride Crys Cr 20 Meq Cr-Tabs (Potassium Chloride Crys Cr) .... Take One Tablet By Mouth Daily 6)  Pepcid 20 Mg Tabs (Famotidine) .... Take One By Mouth Once Daily As Needed 7)  Furosemide 40 Mg Tabs (Furosemide) .... Take One By Mouth Once Two Times A Day. 8)  Ventolin Hfa 108 (90 Base) Mcg/act Aers (Albuterol Sulfate) .... 2 Puffs Q4h As Needed Sob 9)  Ensure  Liqd (Nutritional  Supplements) .... Three Times A Day As Needed 10)  Oxygen .... Wear 3.5l/min To 4l/min Continuously 11)  Spiriva Handihaler 18 Mcg Caps (Tiotropium Bromide Monohydrate) .... Inhale Contents of 1 Capsule Once A Day 12)  Symbicort 80-4.5 Mcg/act Aero (Budesonide-Formoterol Fumarate) .... Inhale 2 Puffs Two Times A Day 13)  Mucinex 600 Mg Xr12h-Tab (Guaifenesin) .... Take 2 Tablets Every 12 Hours 14)  Prednisone 10 Mg Tabs (Prednisone) .Marland Kitchen.. 1 By Mouth Once Daily X 1 Week 15)  Tylenol Extra Strength 500 Mg Tabs (Acetaminophen) .... Per Bottle As Needed 16)  Avelox 400 Mg Tabs (Moxifloxacin Hcl) .Marland Kitchen.. 1 By Mouth Once Daily 17)  Prednisone 10 Mg  Tabs (Prednisone) .... 2 By Mouth Once Daily X 1 Week , Then 1 By Mouth Once Daily X 4 Days Then 1/2 Once Daily X 4 Days Then Stop  Current Medications (verified): 1)  Adult Aspirin Ec Low Strength 81 Mg  Tbec (Aspirin) .... Take 1 Tablet By Mouth Once A Day 2)  Metoprolol Tartrate 100 Mg Tabs (Metoprolol Tartrate) .... Take 1/2 Tablet By Mouth Twice A Day 3)  Coumadin 5 Mg  Tabs (Warfarin Sodium) .... Take As Directed By Coumadin Clinic. 4)  Ferrous Sulfate 325 (65 Fe) Mg Tabs (Ferrous Sulfate) .... Take One  By Mouth Two Times A Day 5)  Potassium Chloride Crys Cr 20 Meq Cr-Tabs (Potassium Chloride Crys Cr) .... Take One Tablet By Mouth Daily 6)  Pepcid 20 Mg Tabs (Famotidine) .... Take One By Mouth Once Daily As Needed 7)  Furosemide 40 Mg Tabs (Furosemide) .... Take One By Mouth Once Two Times A Day. 8)  Ventolin Hfa 108 (90 Base) Mcg/act Aers (Albuterol Sulfate) .... 2 Puffs Q4h As Needed Sob 9)  Ensure  Liqd (Nutritional Supplements) .... Three Times A Day As Needed 10)  Oxygen .... Wear 3.5l/min To 4l/min Continuously 11)  Spiriva Handihaler 18 Mcg Caps (Tiotropium Bromide Monohydrate) .... Inhale Contents of 1 Capsule Once A Day 12)  Symbicort 80-4.5 Mcg/act Aero (Budesonide-Formoterol Fumarate) .... Inhale 2 Puffs Two Times A Day 13)  Mucinex 600 Mg Xr12h-Tab (Guaifenesin) .... Take 2 Tablets Every 12 Hours 14)  Tylenol Extra Strength 500 Mg Tabs (Acetaminophen) .... Per Bottle As Needed 15)  Avelox 400 Mg Tabs (Moxifloxacin Hcl) .Marland Kitchen.. 1 By Mouth Once Daily 16)  Prednisone 10 Mg Tabs (Prednisone) .... 2 By Mouth Once Daily X 1 Week , Then 1 By Mouth Once Daily X 4 Days Then 1/2 Once Daily X 4 Days Then Stop  Allergies (verified): 1)  ! Pcn  Past History:  Past Medical History: Last updated: 01/01/2010 Current Problems:  ATRIAL FIBRILLATION (ICD-427.31) AORTIC INSUFFICIENCY (ICD-424.1) CARDIOMYOPATHY (ICD-425.4) PERIPHERAL EDEMA (ICD-782.3)Chronic lower extremity  edema. 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) UNS ADVRS EFF UNS RX MEDICINAL&BIOLOGICAL SBSTNC (ICD-995.20) UMBILICAL HERNIA (ICD-553.1) SEBACEOUS CYST, NECK (ICD-706.2) UNSPECIFIED LABYRINTHITIS (ICD-386.30) DYSPEPSIA, HX OF (ICD-V12.79) SPECIAL SCREENING MALIGNANT NEOPLASM OF PROSTATE (ICD-V76.44) HERPES ZOSTER, HX OF 1996 (ICD-V13.8) BRONCHOGENIC CARCINOMA S/P LOBECTOMY 2011  Past Surgical History: Last updated: 23-Jun-2009 Aortic Valve Replacement: '94 - St Jude's  Appendectomy-youth repair of abdominal wall hernia  Family History: Last updated: 06-23-2009 father - deceased 44; colon cancer mother - deceased 28; aneurysm rupture - neck., CAD Neg - prostate cancer, DM,   Social History: Last updated: 06-23-09 married '70  1 daughter  '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  Risk Factors: Smoking  Status: quit < 6 months (03/12/2010) Packs/Day: .5 (03/12/2010)  Review of Systems      See HPI  The patient denies anorexia, fever, weight loss, weight gain, vision loss, decreased hearing, syncope, headaches, hemoptysis, abdominal pain, melena, hematochezia, severe indigestion/heartburn, hematuria, incontinence, muscle weakness, suspicious skin lesions, transient blindness, difficulty walking, depression, unusual weight change, abnormal bleeding, enlarged lymph nodes, and angioedema.    Physical Exam  Additional Exam:  GEN: A/Ox3; pleasant , NAD, chronically ill appearing wheelchair.  HEENT:  [place]/AT, , EACs-clear, TMs-wnl, NOSE-clear, THROAT-clear NECK:  Supple w/ fair ROM; no JVD; normal carotid impulses w/o bruits; no thyromegaly or nodules palpated; no lymphadenopathy. RESP  Coarse BS , diminshed in bases , speaking in full sentences, no accessory use CARD:  RRR, no m/r/g ,  tr edema  + click (hx of AVR ) GI:   Soft & nt; nml bowel sounds; no organomegaly or masses detected. [place]: Warm bil,  no calf tenderness  clubbing, pulses intact Neuro:intact w/ no focal deficits noted.  Skin: thin skin,scattered eccymotic areas on forearms.   Impression & Recommendations:  Problem # 1:  PNEUMONIA (ICD-486)  New RLL PNA with severe underlying COPD and acute on chronic hypoxic respiratory failure.  with recent suspected pneumonitis from amiodarone toxicity.  Pt has very low reserve and had not responded to OP therapy as hoped.  Will admit to hospital , labs and xray pending.  Aggressive pulmonary hygiene will cont on steroid- IV for now with taper to by mouth as soon as possible (has tapered down to 10mg  from recent Pneumonitis. )  His updated medication list for this problem includes:    Avelox 400 Mg Tabs (Moxifloxacin hcl) .Marland Kitchen... 1 by mouth once daily  Orders: No Charge Patient Arrived (NCPA0) (NCPA0)  Problem # 2:  PROSTHETIC VALVE-MECHANICAL (ICD-V43.3) hx of AVR 1994-  and hx of afib  cont on coumadin per pharm protocol.   Patient Instructions: 1)  Admit to hospital   Orders Added: 1)  No Charge Patient Arrived (NCPA0) [NCPA0]

## 2010-03-20 NOTE — Letter (Signed)
Summary: [facility] Office Note   [facility] Office Note   Imported By: Roderic Ovens 03/14/2010 12:25:41  _____________________________________________________________________  External Attachment:    Type:   Image     Comment:   External Document

## 2010-03-22 ENCOUNTER — Encounter: Payer: Self-pay | Admitting: Cardiology

## 2010-03-22 ENCOUNTER — Inpatient Hospital Stay (HOSPITAL_COMMUNITY)
Admission: EM | Admit: 2010-03-22 | Discharge: 2010-03-25 | DRG: 308 | Disposition: A | Discharge status: Home Health | Payer: Medicare Other | Attending: Internal Medicine | Admitting: Internal Medicine

## 2010-03-22 ENCOUNTER — Emergency Department (HOSPITAL_COMMUNITY): Payer: Medicare Other

## 2010-03-22 DIAGNOSIS — E785 Hyperlipidemia, unspecified: Secondary | ICD-10-CM | POA: Diagnosis present

## 2010-03-22 DIAGNOSIS — I509 Heart failure, unspecified: Secondary | ICD-10-CM | POA: Diagnosis present

## 2010-03-22 DIAGNOSIS — I4891 Unspecified atrial fibrillation: Secondary | ICD-10-CM

## 2010-03-22 DIAGNOSIS — I2789 Other specified pulmonary heart diseases: Secondary | ICD-10-CM | POA: Diagnosis present

## 2010-03-22 DIAGNOSIS — IMO0002 Reserved for concepts with insufficient information to code with codable children: Secondary | ICD-10-CM

## 2010-03-22 DIAGNOSIS — Z8249 Family history of ischemic heart disease and other diseases of the circulatory system: Secondary | ICD-10-CM

## 2010-03-22 DIAGNOSIS — Z9981 Dependence on supplemental oxygen: Secondary | ICD-10-CM

## 2010-03-22 DIAGNOSIS — I428 Other cardiomyopathies: Secondary | ICD-10-CM | POA: Diagnosis present

## 2010-03-22 DIAGNOSIS — Z880 Allergy status to penicillin: Secondary | ICD-10-CM

## 2010-03-22 DIAGNOSIS — Z7901 Long term (current) use of anticoagulants: Secondary | ICD-10-CM

## 2010-03-22 DIAGNOSIS — Z85118 Personal history of other malignant neoplasm of bronchus and lung: Secondary | ICD-10-CM

## 2010-03-22 DIAGNOSIS — I4892 Unspecified atrial flutter: Secondary | ICD-10-CM

## 2010-03-22 DIAGNOSIS — Z954 Presence of other heart-valve replacement: Secondary | ICD-10-CM

## 2010-03-22 DIAGNOSIS — I5043 Acute on chronic combined systolic (congestive) and diastolic (congestive) heart failure: Secondary | ICD-10-CM | POA: Diagnosis present

## 2010-03-22 DIAGNOSIS — K219 Gastro-esophageal reflux disease without esophagitis: Secondary | ICD-10-CM | POA: Diagnosis present

## 2010-03-22 DIAGNOSIS — J449 Chronic obstructive pulmonary disease, unspecified: Secondary | ICD-10-CM | POA: Diagnosis present

## 2010-03-22 DIAGNOSIS — I10 Essential (primary) hypertension: Secondary | ICD-10-CM | POA: Diagnosis present

## 2010-03-22 DIAGNOSIS — J4489 Other specified chronic obstructive pulmonary disease: Secondary | ICD-10-CM | POA: Diagnosis present

## 2010-03-22 DIAGNOSIS — Z7982 Long term (current) use of aspirin: Secondary | ICD-10-CM

## 2010-03-23 LAB — POCT I-STAT, CHEM 8
Calcium, Ion: 1.05 mmol/L — ABNORMAL LOW (ref 1.12–1.32)
Creatinine, Ser: 1.2 mg/dL (ref 0.4–1.5)
Hemoglobin: 13.3 g/dL (ref 13.0–17.0)
Sodium: 136 mEq/L (ref 135–145)
TCO2: 30 mmol/L (ref 0–100)

## 2010-03-23 LAB — DIFFERENTIAL
Basophils Absolute: 0 10*3/uL (ref 0.0–0.1)
Basophils Relative: 0 % (ref 0–1)
Eosinophils Absolute: 0 10*3/uL (ref 0.0–0.7)
Monocytes Absolute: 0.7 10*3/uL (ref 0.1–1.0)
Neutro Abs: 15.8 10*3/uL — ABNORMAL HIGH (ref 1.7–7.7)
Neutrophils Relative %: 89 % — ABNORMAL HIGH (ref 43–77)

## 2010-03-23 LAB — GLUCOSE, CAPILLARY: Glucose-Capillary: 162 mg/dL — ABNORMAL HIGH (ref 70–99)

## 2010-03-23 LAB — PROTIME-INR: Prothrombin Time: 21.5 seconds — ABNORMAL HIGH (ref 11.6–15.2)

## 2010-03-23 LAB — BASIC METABOLIC PANEL
CO2: 29 mEq/L (ref 19–32)
Calcium: 8.7 mg/dL (ref 8.4–10.5)
GFR calc Af Amer: >60 mL/min (ref >60)
Potassium: 3.6 mEq/L (ref 3.5–5.1)
Sodium: 139 mEq/L (ref 135–145)

## 2010-03-23 LAB — POCT CARDIAC MARKERS

## 2010-03-23 LAB — CBC
Hemoglobin: 12 g/dL — ABNORMAL LOW (ref 13.0–17.0)
MCHC: 31.3 g/dL (ref 30.0–36.0)
Platelets: 306 10*3/uL (ref 150–400)

## 2010-03-24 DIAGNOSIS — I4891 Unspecified atrial fibrillation: Secondary | ICD-10-CM

## 2010-03-24 LAB — BASIC METABOLIC PANEL
Calcium: 8.7 mg/dL (ref 8.4–10.5)
Chloride: 100 mEq/L (ref 96–112)
Creatinine, Ser: 0.93 mg/dL (ref 0.4–1.5)
GFR calc Af Amer: >60 mL/min (ref >60)
Sodium: 139 mEq/L (ref 135–145)

## 2010-03-24 LAB — PROTIME-INR: Prothrombin Time: 24.8 seconds — ABNORMAL HIGH (ref 11.6–15.2)

## 2010-03-24 LAB — MAGNESIUM: Magnesium: 2.4 mg/dL (ref 1.5–2.5)

## 2010-03-24 LAB — HEPARIN LEVEL (UNFRACTIONATED): Heparin Unfractionated: 0.3 IU/mL (ref 0.30–0.70)

## 2010-03-24 NOTE — Discharge Summary (Addendum)
John [name], John [name]               ACCOUNT NO.:  [id number]  MEDICAL RECORD NO.:  [id number]           PATIENT TYPE:  I  LOCATION:  4711                         FACILITY:  [facility]  PHYSICIAN:  Casimiro Needle B. Sherene Sires, MD, FCCPDATE OF BIRTH:  07[birth year]09/[birth year]  DATE OF ADMISSION:  03/15/2010 DATE OF DISCHARGE:  03/20/2010                              DISCHARGE SUMMARY   DISCHARGE DIAGNOSIS: 1. 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 2. Chronic obstructive pulmonary disease. 3. History of atrial fibrillation/aortic valve replacement, on     Coumadin. 4. Hypertension.  LABORATORY DATA:  March 20, 2010, ESR is 31.  March 20, 2010, BNP is 219.  March 20, 2010, BMP demonstrates sodium 138, potassium 4.7, chloride 98, CO2 31, glucose 140, BUN 24, creatinine 1.04, and calcium 9.1.  March 20, 2010, protime is 34.6, INR 3.43.  March 20, 2010, CBC demonstrates WBC 13.5, hemoglobin 11.1, hematocrit 35.1, platelet count 313.  March 17, 2010, ESR is 50.  March 16, 2010, TSH is 0.952.  March 16, 2010, procalcitonin is less than 0.10.  MICRODATA:  March 15, 2010, urine Legionella is negative.  March 15, 2010, urine culture is negative.  March 16, 2010, sputum culture is not represented to the lower respiratory secretions.  March 15, 2010, two-view of the chest demonstrates heart size with upper normal limits, right lower lobe airspace densities slightly more prominent, but not greatly changed, small right pleural effusion versus chronic pleural thickening.  March 17, 2010, right decubitus demonstrates improvement in right lower lobe infiltrate.  No change in pleural blunting on the right.  March 17, 2010, right decubitus demonstrates no evidence of layering fluid on the right.  March 20, 2010, chest x-ray demonstrate chronic volume loss of right lung base associated with blunting of the right costophrenic angle.  Please note, the patient does  have a history of right lower lobe surgical intervention for non-small cell lung cancer.  HISTORY OF PRESENT ILLNESS:  John [name] is a 77 year old former smoker with a past medical history of COPD, [employer]. Jude's aortic valve replacement for aortic insufficiency in 1994, on Coumadin, a right lower lobe nodule that was diagnosed as non-small cell lung cancer.  In August 2011, status post right lower lobectomy per Dr. Edwyna Shell.  His course was complicated by hydropneumothorax/hemathorax postop.  In September when he had his lobectomy, he did have atrial fibrillation postoperatively and was started on amiodarone toxicity.  In December, it was thought that he possibly could have had amiodarone toxicity as his pulmonary symptoms at that time were steroid responsive.  He was seen on March 15, 2010 in office for an acute office visit.  He was seen on March 12, 2010 for a possible acute exacerbation of COPD and right lower lobe pneumonia, was treated with Avelox and prednisone.  On March 15, 2010, he presented to the office with reports of feeling no better.  His initial O2 saturation was 69% on 2 liters continuous flow.  He at that time reported that he was very weak and would wear-off with minimal activity.  John [name] was directly admitted to [facility]  from the Pulmonary office.  After review of his medical history and radiologic data, it was felt that he did not have right effusion or pneumonia. However, it was felt that this was related to amiodarone toxicity versus possible BOOP.  He was a steroid responsive during hospitalization and his ESR did decrease with steroids.  It does appear that John [name] is going to be somewhat prednisone dependent and thus will be discharged on very careful slow taper of prednisone.  He will remain on prednisone 40 for 2 weeks and then consider tapering beyond that.  During hospitalization, he was maintained on continuous O2 for his underlying COPD.  He  was ambulated at the time of discharge with me on 3 liters of O2 and his oxygen saturation did decrease to 84%.  With reports of him feeling short of breath, it was discussed with John [name] that he will need to use "a sliding scale oxygen with activity."  If at rest, he can use 3 liters continuous; however, with activity, he should increase to 4- 5 liters depending on oxygen saturations.  It was recommended that he maintain oxygen saturations greater than 88%.  His hospital course was complicated by a supratherapeutic INR, it was noted to increase to 5.17 on March 19, 2010; however, he did not have any evidence of bleeding, the increase was thought to be secondary to Avelox administration.  At the time of discharge, his PT/INR is drifting down.  He also was changed from his metoprolol dosing at home to bisoprolol.  During hospitalization, his blood pressure will need to be reviewed at time of followup.  HOSPITAL COURSE BY DISCHARGE DIAGNOSES: 1. 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  John [name] was seen in the Pulmonary office on March 15, 2010, after having been seen on March 12, 2010 for an acute     office visit with complaints of increasing shortness of breath,     fatigue, and decreased activity tolerance.  His oxygen saturations     at the time were noted to be 69%.  He was immediately transferred     to [facility] for hospital admission and further evaluation.     Initially at time of admission, it was suspicious for possible     right lower lobe pneumonia; however, during course of     hospitalization, felt that it was not a right lower lobe pneumonia;     however, likely related to possible atelectasis and his history of     right lower lobe lobectomy.  His pulmonary symptoms did improve     with IV Solu-Medrol and ESR trended down.  At time of discharge, it     was 31.  It was thought likely some of his pulmonary symptoms  could     be secondary to amiodarone toxicity.  He will be discharge on     prednisone 40 mg p.o. daily and will need to continue on this for 2     weeks.  I then began a slow taper.  At the time of follow up, he     will need a followup chest x-ray as well as ESR. 2. Chronic obstructive pulmonary disease.  Mr. Mashek will continue on     home oxygen.  It was discussed with him that he will need to     increase his oxygen to 4-5 liters flow with activity.  It is okay     for him  to remain at 3 liters of breath.  It is recommended he     continue his efforts with pulmonary rehab.  He is highly motivated     and willing to try anything to improve his pulmonary tolerance     activity.  It was discussed with him that he can continue to use     his incentive spirometry daily even if he feels well.  He will be     discharged on all previous home medications. 3. History of atrial fibrillation with aortic valve replacement, on     Coumadin.  Mr. Currie during hospitalization has continued to     remain in sinus rhythm.  For historical note, he did undergo an     elective cardioversion in December 2011, has apparently remained in     sinus rhythm since December.  His hospital course was complicated     by supratherapeutic INR.  His INR did reach 5.1 on March 19, 2010.  He did not experience any active bleeding during     hospitalization.  At time of discharge, his INR has back down to     the 3 range.  He will receive 1 mg of Coumadin prior to discharge.     He will have home followup of his PT/INR on the a.m. of March 21, 2010, which will be called to the [facility].  He also     will have a home check on March 25, 2010 and follow up in the     [facility] on March 26, 2010. 4. Hypertension.  During hospitalization, Mr. Albea was changed to     bisoprolol for a more selective beta-blocker for hypertension     management in the setting of pulmonary  disease.  His blood pressure     willing to be reviewed at time of follow up.  DISCHARGE INSTRUCTIONS: 1. Activity as tolerated.  It was discussed with Mr. Rundle regarding     activity that if he begins to feel winded and short of breath that     he needs to back off and slow down during physical activity until     he can reach in a level of activity that does not produce     significant shortness of breath. 2. Diet:  Heart-healthy diet. 3. Followup:  He is scheduled to follow up with Rubye Oaks, nurse     practitioner on March 25, 2010 at 11 a.m.  He also is scheduled     to follow up with Dr. Excell Seltzer on March 26, 2010 at 12:15 p.m. and     in the Coumadin Clinic on March 26, 2010 at 1 p.m.  It is     recommended that he also continued his efforts with pulmonary     rehab.  DISCHARGE MEDICATIONS: 1. Bisoprolol 5 mg by mouth twice daily. 2. Prednisone 40 mg daily by mouth. 3. Normal saline nasal spray as needed. 4. Aspirin 81 mg by mouth daily. 5. Coumadin, he will receive 1 mg prior to discharge at [facility] on     March 20, 2010 and further instructions per [facility] Coumadin     Clinic.  He is scheduled for PT/INR on March 21, 2010 and again     on the a.m. of March 25, 2010 in the setting of supratherapeutic     INR during hospitalization. 6. Ensure liquid supplement three times daily as needed.7. Ferrous  sulfate 325 mg by mouth twice daily. 8. Furosemide 40 mg by mouth twice daily. 9. Mucinex 600 mg XR two tablets every 12 hours. 10.Pepcid 20 mg by mouth daily as needed for heartburn. 11.Potassium 20 mEq by mouth daily. 12.Spiriva 18 mcg one capsule inhaled daily. 13.Symbicort 80/4.5 two puffs inhaled twice daily. 14.Tylenol Extra Strength one to two tablets by mouth daily as needed     for pain. 15.Ventolin two puffs inhaled every 4 hours as needed for shortness of     breath.  Mr. Strycharz has been instructed to stop taking the following medications: 1.  Metoprolol. 2. Avelox. 3. Previous prednisone dosing of 20 mg daily.  He will need to     continue on 40 mg daily.  DISPOSITION AT TIME OF DISCHARGE:  Mr. Choi has met maximum benefit of inpatient therapy and is currently medically stable and cleared for discharge pending follow up as above.     Canary Brim, NP   ______________________________ Charlaine Dalton. Sherene Sires, MD, FCCP    BO/MEDQ  D:  03/20/2010  T:  03/21/2010  Job:  [id number]  cc:   Veverly Fells. Excell Seltzer, MD [facility]  Electronically Signed by Sandrea Hughs MD [facility] on 03/24/2010 01:19:39 PM Electronically Signed by Canary Brim  on 04/04/2010 02:25:08 PM

## 2010-03-25 ENCOUNTER — Inpatient Hospital Stay: Payer: Medicare Other | Admitting: Adult Health

## 2010-03-25 LAB — CBC
HCT: 35.3 % — ABNORMAL LOW (ref 39.0–52.0)
MCHC: 31.4 g/dL (ref 30.0–36.0)
MCV: 88.5 fL (ref 78.0–100.0)
RDW: 19.4 % — ABNORMAL HIGH (ref 11.5–15.5)

## 2010-03-26 ENCOUNTER — Ambulatory Visit: Payer: Self-pay | Admitting: Cardiovascular Disease

## 2010-03-26 ENCOUNTER — Encounter: Payer: Self-pay | Admitting: Cardiovascular Disease

## 2010-03-27 ENCOUNTER — Encounter: Payer: Self-pay | Admitting: Internal Medicine

## 2010-03-27 LAB — AMIODARONE LEVEL: Amiodarone Lvl: <0.3 ug/mL — ABNORMAL LOW (ref 1.5–2.5)

## 2010-03-28 ENCOUNTER — Telehealth: Payer: Self-pay | Admitting: Internal Medicine

## 2010-03-28 NOTE — Miscellaneous (Signed)
Summary: [facility] Orders   [facility] Orders   Imported By: Roderic Ovens 03/18/2010 14:32:52  _____________________________________________________________________  External Attachment:    Type:   Image     Comment:   External Document

## 2010-03-28 NOTE — Medication Information (Signed)
Summary: Coumadin Clinic  Anticoagulant Therapy  Managed by: Weston Brass, PharmD Referring MD: [name] PCP: Link Snuffer MD: Daleen Squibb MD, Maisie Fus Indication 1: Aortic Valve Replacement (ICD-V43.3) Indication 2: Aortic Valve Disorder (ICD-424.1) Lab Used: [facility] [place] Site: [address] INR POC 2.9 INR RANGE 2.5 - 3.5  Dietary changes: no    Health status changes: no    Bleeding/hemorrhagic complications: no    Recent/future hospitalizations: yes       Details: discharged on 2/8 for ?PNA/amiodarone toxicity  Any changes in medication regimen? yes       Details: prednisone increased to 40mg  daily  Recent/future dental: no  Any missed doses?: yes     Details: missed yesterday's dose  Is patient compliant with meds? yes      Comments: INR increased to 5.17 during hospitalization.  Was 3.43 on 2/8.  Pt discharged on 1mg  daily   Allergies: 1)  ! Pcn  Anticoagulation Management History:      His anticoagulation is being managed by telephone today.  Positive risk factors for bleeding include an age of 46 years or older.  The bleeding index is 'intermediate risk'.  Positive CHADS2 values include History of HTN.  Negative CHADS2 values include Age > 108 years old.  The start date was 09/05/1998.  His last INR was 5.9.  Anticoagulation responsible provider: Daleen Squibb MD, Maisie Fus.  INR POC: 2.9.  Exp: 03/2011.    Anticoagulation Management Assessment/Plan:      The patient's current anticoagulation dose is Coumadin 5 mg  tabs: Take as directed by coumadin clinic..  The target INR is 2.5-3.5.  The next INR is due 03/26/2010.  Anticoagulation instructions were given to home health nurse.  Results were reviewed/authorized by Weston Brass, PharmD.  He was notified by Weston Brass PharmD.         Prior Anticoagulation Instructions: INR 3.6  Spoke with Kathie Rhodes, [facility] RN while at pt's home.  Advised to continue on same dosage 2.5mg  daily, eat something green today.  Today is  last [facility] visit.  Scheduled pt to return for f/u in clinic on 03/20/10.   Current Anticoagulation Instructions: INR 2.9  Spoke with pt.  Restart previous dose of 1/2 tablet every day.  Recheck INR in office on 2/14.  Orders given to [facility] with [facility].

## 2010-03-28 NOTE — Miscellaneous (Signed)
Summary: [facility] Orders   [facility] Orders   Imported By: Roderic Ovens 03/18/2010 14:34:16  _____________________________________________________________________  External Attachment:    Type:   Image     Comment:   External Document

## 2010-03-29 ENCOUNTER — Inpatient Hospital Stay (HOSPITAL_COMMUNITY)
Admission: AD | Admit: 2010-03-29 | Discharge: 2010-04-01 | DRG: 309 | Disposition: A | Discharge status: Home Health | Payer: Medicare Other | Source: Ambulatory Visit | Attending: Internal Medicine | Admitting: Internal Medicine

## 2010-03-29 DIAGNOSIS — J449 Chronic obstructive pulmonary disease, unspecified: Secondary | ICD-10-CM | POA: Diagnosis present

## 2010-03-29 DIAGNOSIS — I079 Rheumatic tricuspid valve disease, unspecified: Secondary | ICD-10-CM | POA: Diagnosis present

## 2010-03-29 DIAGNOSIS — Z954 Presence of other heart-valve replacement: Secondary | ICD-10-CM

## 2010-03-29 DIAGNOSIS — Z880 Allergy status to penicillin: Secondary | ICD-10-CM

## 2010-03-29 DIAGNOSIS — Z85118 Personal history of other malignant neoplasm of bronchus and lung: Secondary | ICD-10-CM

## 2010-03-29 DIAGNOSIS — I2789 Other specified pulmonary heart diseases: Secondary | ICD-10-CM | POA: Diagnosis present

## 2010-03-29 DIAGNOSIS — J4489 Other specified chronic obstructive pulmonary disease: Secondary | ICD-10-CM | POA: Diagnosis present

## 2010-03-29 DIAGNOSIS — I428 Other cardiomyopathies: Secondary | ICD-10-CM | POA: Diagnosis present

## 2010-03-29 DIAGNOSIS — I4891 Unspecified atrial fibrillation: Principal | ICD-10-CM | POA: Diagnosis present

## 2010-03-29 DIAGNOSIS — Z7982 Long term (current) use of aspirin: Secondary | ICD-10-CM

## 2010-03-29 DIAGNOSIS — E785 Hyperlipidemia, unspecified: Secondary | ICD-10-CM | POA: Diagnosis present

## 2010-03-29 DIAGNOSIS — Z79899 Other long term (current) drug therapy: Secondary | ICD-10-CM

## 2010-03-29 DIAGNOSIS — I509 Heart failure, unspecified: Secondary | ICD-10-CM | POA: Diagnosis present

## 2010-03-29 DIAGNOSIS — I5022 Chronic systolic (congestive) heart failure: Secondary | ICD-10-CM | POA: Diagnosis present

## 2010-03-29 DIAGNOSIS — IMO0002 Reserved for concepts with insufficient information to code with codable children: Secondary | ICD-10-CM

## 2010-03-29 DIAGNOSIS — Z7901 Long term (current) use of anticoagulants: Secondary | ICD-10-CM

## 2010-03-29 DIAGNOSIS — I10 Essential (primary) hypertension: Secondary | ICD-10-CM | POA: Diagnosis present

## 2010-03-29 DIAGNOSIS — D649 Anemia, unspecified: Secondary | ICD-10-CM | POA: Diagnosis present

## 2010-03-29 LAB — BASIC METABOLIC PANEL
Calcium: 9.3 mg/dL (ref 8.4–10.5)
GFR calc Af Amer: >60 mL/min (ref >60)
GFR calc non Af Amer: >60 mL/min (ref >60)
Sodium: 138 mEq/L (ref 135–145)

## 2010-03-29 LAB — PROTIME-INR: Prothrombin Time: 29.3 seconds — ABNORMAL HIGH (ref 11.6–15.2)

## 2010-03-30 DIAGNOSIS — I4891 Unspecified atrial fibrillation: Secondary | ICD-10-CM

## 2010-03-30 LAB — BASIC METABOLIC PANEL
BUN: 21 mg/dL (ref 6–23)
Creatinine, Ser: 0.91 mg/dL (ref 0.4–1.5)
GFR calc non Af Amer: >60 mL/min (ref >60)

## 2010-03-30 LAB — PROTIME-INR
INR: 3.16 — ABNORMAL HIGH (ref 0.00–1.49)
Prothrombin Time: 32.5 seconds — ABNORMAL HIGH (ref 11.6–15.2)

## 2010-03-31 LAB — CBC
MCV: 89.2 fL (ref 78.0–100.0)
Platelets: 180 10*3/uL (ref 150–400)
RDW: 19.8 % — ABNORMAL HIGH (ref 11.5–15.5)
WBC: 13.2 10*3/uL — ABNORMAL HIGH (ref 4.0–10.5)

## 2010-03-31 LAB — BASIC METABOLIC PANEL
BUN: 23 mg/dL (ref 6–23)
CO2: 29 mEq/L (ref 19–32)
Calcium: 8.7 mg/dL (ref 8.4–10.5)
Creatinine, Ser: 0.92 mg/dL (ref 0.4–1.5)
GFR calc Af Amer: >60 mL/min (ref >60)

## 2010-04-01 DIAGNOSIS — I359 Nonrheumatic aortic valve disorder, unspecified: Secondary | ICD-10-CM

## 2010-04-01 DIAGNOSIS — Z954 Presence of other heart-valve replacement: Secondary | ICD-10-CM

## 2010-04-01 DIAGNOSIS — I4891 Unspecified atrial fibrillation: Secondary | ICD-10-CM

## 2010-04-01 LAB — BASIC METABOLIC PANEL
BUN: 21 mg/dL (ref 6–23)
Chloride: 99 mEq/L (ref 96–112)
GFR calc non Af Amer: >60 mL/min (ref >60)
Glucose, Bld: 78 mg/dL (ref 70–99)
Potassium: 4.9 mEq/L (ref 3.5–5.1)
Sodium: 137 mEq/L (ref 135–145)

## 2010-04-01 LAB — PROTIME-INR: Prothrombin Time: 26.7 seconds — ABNORMAL HIGH (ref 11.6–15.2)

## 2010-04-01 LAB — BRAIN NATRIURETIC PEPTIDE: Pro B Natriuretic peptide (BNP): 116 pg/mL — ABNORMAL HIGH (ref 0.0–100.0)

## 2010-04-03 ENCOUNTER — Ambulatory Visit: Payer: Self-pay | Admitting: Emergency Medicine

## 2010-04-03 ENCOUNTER — Other Ambulatory Visit: Payer: Medicare Other

## 2010-04-03 NOTE — Medication Information (Signed)
Summary: Coumadin Clinic  Anticoagulant Therapy  Managed by: Weston Brass, PharmD Referring MD: [name] PCP: Link Snuffer MD: Gala Romney MD, Reuel Boom Indication 1: Aortic Valve Replacement (ICD-V43.3) Indication 2: Aortic Valve Disorder (ICD-424.1) Lab Used: [facility] [place] Site: [address] INR POC 3.5 INR RANGE 2.5 - 3.5  Dietary changes: yes       Details: has not had as many salads  Health status changes: no    Bleeding/hemorrhagic complications: no    Recent/future hospitalizations: yes       Details: Discharged 2/13 after afib.  INR on discharge was 2.3.   Any changes in medication regimen? yes       Details: prednisone increased to 40mg  daily  Recent/future dental: no  Any missed doses?: yes     Details: missed last Thursday before going to hospital on Friday  Is patient compliant with meds? yes       Allergies: 1)  ! Pcn  Anticoagulation Management History:      His anticoagulation is being managed by telephone today.  Positive risk factors for bleeding include an age of 71 years or older.  The bleeding index is 'intermediate risk'.  Positive CHADS2 values include History of HTN.  Negative CHADS2 values include Age > 84 years old.  The start date was 09/05/1998.  His last INR was 5.9.  Anticoagulation responsible provider: Bensimhon MD, Reuel Boom.  INR POC: 3.5.  Exp: 03/2011.    Anticoagulation Management Assessment/Plan:      The patient's current anticoagulation dose is Coumadin 5 mg  tabs: Take as directed by coumadin clinic..  The target INR is 2.5-3.5.  The next INR is due 04/01/2010.  Anticoagulation instructions were given to home health nurse.  Results were reviewed/authorized by Weston Brass, PharmD.  He was notified by Weston Brass PharmD.         Prior Anticoagulation Instructions: INR 2.9  Spoke with pt.  Restart previous dose of 1/2 tablet every day.  Recheck INR in office on 2/14.  Orders given to [facility] with [facility].   Current  Anticoagulation Instructions: INR 3.5 per Victorino Dike with [facility].  Spoke with pt's wife.  Skip today's dose of coumadin then resume 1/2 tablet daily.  Recheck INR on 2/20. Gave instructions to [place] at [facility]. Windell Hummingbird, RN  March 27, 2010 10:52 AM

## 2010-04-03 NOTE — Progress Notes (Signed)
Summary: heart rate  Phone Note Call from Patient Call back at Home Phone [phone] P PH     Caller: Patient Reason for Call: Talk to Nurse Summary of Call: pt state heart rate almost at 150. no chest pain. pt is having sob when he moves or does anything Initial call taken by: Roe Coombs,  March 28, 2010 1:39 PM  Follow-up for Phone Call        discussed with Dr Ladona Ridgel  Will arrange for pt to be admitted for a Tikosyn load tomorrow.  Will need a TEE prior as his INR was only 1.85 on 03/25/10.  Weston Brass, Pharm D will call pt in the morning to arrange all of this.  Pt aware Dennis Bast, RN, BSN  March 28, 2010 5:52 PM

## 2010-04-03 NOTE — H&P (Signed)
John [name], John [name]               ACCOUNT NO.:  [id number]  MEDICAL RECORD NO.:  [id number]           PATIENT TYPE:  LOCATION:                                 FACILITY:  PHYSICIAN:  Bevelyn Buckles. Coleen Cardiff, MDDATE OF BIRTH:  [birth year]  DATE OF ADMISSION: DATE OF DISCHARGE:                             HISTORY & PHYSICAL   PRIMARY CARE PHYSICIAN:  Dr. Illene Regulus  CARDIOLOGIST:  Dr. [name]  REASON FOR ADMISSION:  Recurrent atrial flutter/fibrillation with rapid ventricular response.  John [name] is a very pleasant but complicated 77 year old male with a history of COPD on home oxygen, hypertension, aortic insufficiency, status post St. Jude aortic valve replacement in 1994, paroxysmal atrial fibrillation/flutter.  In October 19, 2009, he underwent VATS resection of a right lower lobe lung cancer.  This was complicated by postop atrial fibrillation as well as a loculated hematoma for which he underwent evacuation.  For his atrial fibrillation, he underwent cardioversion in October 30, 2009, this failed.  He was thus started on amiodarone.  He did well on that. He underwent outpatient cardioversion in January 21, 2010, which was successful.  Unfortunately, he was admitted on December 14, through 20 for increasing shortness of breath.  A chest CT was negative for PE.  Echocardiogram showed left ventricular ejection fraction of 45% with severe pulmonary arterial hypertension with RVSP of 105 mmHg.  This was increased from 63 in September 2011.  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  On February 30, 2011, he presented to the pulmonary office with increasing shortness of breath, found to have saturations in the 60s.  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  He was treated with high-dose steroids which was quite effective and was discharged home. However prior to  admission, his metoprolol was changed to bisoprolol for more cardiac spasticity.  He did not have recurrent atrial fibrillation or flutter while in the hospital.  Tonight he was home, he was feeling okay.  He is checking his oxygen saturations which were good.  He noted that on the monitor his heart rates were in the 140s, so he came to the ER.  He denied any palpitations or increasing shortness of breath.  No chest pain.  In the ER, he was found to be in recurrent atrial flutter/fibrillation.  He was treated with IV Cardizem.  REVIEW OF SYSTEMS:  He denies any fevers or chills.  No bleeding.  No orthopnea.  No PND.  No lower extremity edema.  Remainder of review of systems are pertinent as per HPI and problem list.  Otherwise all systems are negative.  PROBLEM LIST: 1. 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 2. History of paroxysmal atrial fibrillation/flutter.     a.     Cardioversion in September 2011 failed, started on      amiodarone.     b.     Cardioversion January 10, 2010, was successful while on      amiodarone.  This has subsequently been stopped. 3. Right lower lobe  squamous cell carcinoma status post VATS and right     lower lobe lobectomy December 2011.     a.     Status post exploratory thoracotomy for post procedure for      hematoma evacuation.     b.     COPD on 4 liters of home O2. 4. Aortic insufficiency, status post mechanical St. Jude aortic valve     replacement in 1994. 5. Hypertension. 6. Hyperlipidemia. 7. History of nonischemic cardiomyopathy with an EF of 40-45% by echo. 8. Pulmonary hypertension as described above.  CURRENT MEDICATIONS:  Coumadin, aspirin 81 mg a day, Symbicort 2 puffs a day.  Flonase 2 sprays b.i.d., Lasix 40 b.i.d., guaifenesin 1200 b.i.d., bisoprolol 5 b.i.d., Protonix 40 mg daily, potassium 20 a day, prednisone 40 a day, Spiriva 18 mcg inhaled daily, iron 325 a day.  ALLERGIES:  PENICILLIN.  SOCIAL  HISTORY:  He lives in [place].  His wife is retired Environmental manager.  He does not drink alcohol, used to smoke but quit.  FAMILY HISTORY:  Positive for coronary artery disease in his mother and colon cancer in his father.  PHYSICAL EXAMINATION:  GENERAL:  He is a chronically ill appearing male in no acute distress. VITAL SIGNS:  Blood pressure is 107/59, heart rate ranging between 105 and 125.  He is satting at 94% on 3 liters of oxygen. HEENT:  Normal. NECK:  Supple.  No JVP appears mildly elevated.  Carotids are 1+ bilaterally.  No obvious bruits. CARDIAC:  PMI is nonpalpable.  He has distant heart sounds.  He is irregularly tachycardic.  He has crisp mechanical S2 as well as the increased P2. LUNGS:  Decreased air movement throughout but no wheezing. ABDOMEN:  Obese, nontender, and nondistended. EXTREMITIES:  Warm with no cyanosis, clubbing, or edema.  No rash. NEURO:  Alert and oriented x3.  Cranial nerves II through XII are intact.  Moves all 4 extremities without difficulty.  Affect is pleasant.  LABORATORY DATA:  Labs show sodium 136, potassium 4.5, creatinine 1.2, INR of 1.85.  Chest x-ray shows COPD and persistent right lung base opacity.  EKG shows what appears to be atypical atrial flutter rate about 122.  No ST-T wave abnormalities.  ASSESSMENT: 1. Recurrent atypical A flutter plus/minus atrial fibrillation. 2. 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 3. Chronic obstructive pulmonary disease on home O2. 4. History of aortic insufficiency, status post mechanical St. Jude     AVR in 1994. 5. Nonischemic cardiomyopathy with a 45% severe pulmonary     hypertension.  PLAN/DISCUSSION:  He was admitted to step down  and back in to telemetry given his subtherapeutic INR.  We will start heparin, continue his Coumadin.  We will start him on Cardizem drip.  We will repeat echocardiogram to recesses and hypertension.  We will try rate control for now.  We  will have EPC and further recommendations regarding A flutter/fib.  We will also check an amiodarone level and see if Tikosyn therapy is considered.     Bevelyn Buckles. Lyndsee Casa, MD     DRB/MEDQ  D:  03/23/2010  T:  03/23/2010  Job:  [id number]  cc:   Rosalyn Gess. Norins, MD Veverly Fells. Excell Seltzer, MD  Electronically Signed by Arvilla Meres MD on 04/03/2010 01:57:46 PM

## 2010-04-03 NOTE — Consult Note (Signed)
Summary: [facility] Consultation Report  [facility] Consultation Report   Imported By: Earl Many 03/26/2010 14:36:54  _____________________________________________________________________  External Attachment:    Type:   Image     Comment:   External Document

## 2010-04-03 NOTE — Letter (Signed)
Summary: [facility]  [facility]   Imported By: Lennie Odor 03/29/2010 09:57:13  _____________________________________________________________________  External Attachment:    Type:   Image     Comment:   External Document

## 2010-04-03 NOTE — Progress Notes (Signed)
Summary: [facility]: Office Visit  [facility]: Office Visit   Imported By: Earl Many 03/27/2010 17:28:34  _____________________________________________________________________  External Attachment:    Type:   Image     Comment:   External Document

## 2010-04-04 ENCOUNTER — Encounter: Payer: Self-pay | Admitting: Cardiovascular Disease

## 2010-04-04 ENCOUNTER — Encounter: Payer: Self-pay | Admitting: Internal Medicine

## 2010-04-04 LAB — CONVERTED CEMR LAB
POC INR: 2.5
Prothrombin Time: 29.9 s

## 2010-04-05 ENCOUNTER — Encounter: Payer: Self-pay | Admitting: Adult Health

## 2010-04-05 ENCOUNTER — Inpatient Hospital Stay (INDEPENDENT_AMBULATORY_CARE_PROVIDER_SITE_OTHER): Payer: Medicare Other | Admitting: Adult Health

## 2010-04-05 DIAGNOSIS — J189 Pneumonia, unspecified organism: Secondary | ICD-10-CM

## 2010-04-05 DIAGNOSIS — J449 Chronic obstructive pulmonary disease, unspecified: Secondary | ICD-10-CM

## 2010-04-05 DIAGNOSIS — I4891 Unspecified atrial fibrillation: Secondary | ICD-10-CM

## 2010-04-09 NOTE — Medication Information (Addendum)
Summary: Coumadin Clinic  Anticoagulant Therapy  Managed by: Cloyde Reams, RN, BSN Referring MD: [name] PCP: Link Snuffer MD: Ladona Ridgel MD, Sharlot Gowda Indication 1: Aortic Valve Replacement (ICD-V43.3) Indication 2: Aortic Valve Disorder (ICD-424.1) Lab Used: [facility] [place] [facility] [place] Site: [address] PT 29.9 INR POC 2.5 INR RANGE 2.5 - 3.5  Dietary changes: no     Bleeding/hemorrhagic complications: no     Any changes in medication regimen? yes       Details: Currently on Tikosyn and Lisinopril.   Any missed doses?: no       Is patient compliant with meds? yes       Allergies: 1)  ! Pcn  Anticoagulation Management History:      The patient is taking warfarin and comes in today for a routine follow up visit.  Positive risk factors for bleeding include an age of 75 years or older.  The bleeding index is 'intermediate risk'.  Positive CHADS2 values include History of HTN.  Negative CHADS2 values include Age > 75 years old.  The start date was 09/05/1998.  His last INR was 5.9.  Prothrombin time is 29.9.  Anticoagulation responsible provider: Ladona Ridgel MD, Sharlot Gowda.  INR POC: 2.5.  Exp: 03/2011.    Anticoagulation Management Assessment/Plan:      The patient's current anticoagulation dose is Coumadin 5 mg  tabs: Take as directed by coumadin clinic..  The target INR is 2.5-3.5.  The next INR is due 04/11/2010.  Anticoagulation instructions were given to home health nurse.  Results were reviewed/authorized by Cloyde Reams, RN, BSN.  He was notified by Cloyde Reams RN.         Prior Anticoagulation Instructions: INR 3.5 per Victorino Dike with [facility].  Spoke with pt's wife.  Skip today's dose of coumadin then resume 1/2 tablet daily.  Recheck INR on 2/20. Gave instructions to [place] at [facility]. Windell Hummingbird, RN  March 27, 2010 10:52 AM  Current Anticoagulation Instructions: INR 2.5  Spoke with Kathie Rhodes, [facility] RN while at pt's home.  Advised to continue on same dosage 2.5mg   daily.  Recheck in 1 week.

## 2010-04-10 ENCOUNTER — Encounter: Payer: Medicare Other | Admitting: Internal Medicine

## 2010-04-10 ENCOUNTER — Other Ambulatory Visit: Payer: Medicare Other

## 2010-04-11 ENCOUNTER — Encounter: Payer: Self-pay | Admitting: Internal Medicine

## 2010-04-11 LAB — CONVERTED CEMR LAB: Prothrombin Time: 54.1 s

## 2010-04-12 ENCOUNTER — Encounter (INDEPENDENT_AMBULATORY_CARE_PROVIDER_SITE_OTHER): Payer: Medicare Other | Admitting: Cardiovascular Disease

## 2010-04-12 ENCOUNTER — Encounter: Payer: Self-pay | Admitting: Cardiovascular Disease

## 2010-04-12 DIAGNOSIS — I4891 Unspecified atrial fibrillation: Secondary | ICD-10-CM

## 2010-04-12 DIAGNOSIS — I359 Nonrheumatic aortic valve disorder, unspecified: Secondary | ICD-10-CM

## 2010-04-15 ENCOUNTER — Other Ambulatory Visit: Payer: Self-pay | Admitting: Thoracic Surgery

## 2010-04-15 DIAGNOSIS — C3430 Malignant neoplasm of lower lobe, unspecified bronchus or lung: Secondary | ICD-10-CM

## 2010-04-16 ENCOUNTER — Encounter: Payer: Self-pay | Admitting: Emergency Medicine

## 2010-04-16 ENCOUNTER — Encounter (INDEPENDENT_AMBULATORY_CARE_PROVIDER_SITE_OTHER): Payer: Self-pay | Admitting: *Deleted

## 2010-04-16 ENCOUNTER — Other Ambulatory Visit: Payer: Medicare Other

## 2010-04-16 ENCOUNTER — Ambulatory Visit (INDEPENDENT_AMBULATORY_CARE_PROVIDER_SITE_OTHER): Payer: Medicare Other | Admitting: Emergency Medicine

## 2010-04-16 ENCOUNTER — Other Ambulatory Visit: Payer: Self-pay | Admitting: Emergency Medicine

## 2010-04-16 ENCOUNTER — Ambulatory Visit (INDEPENDENT_AMBULATORY_CARE_PROVIDER_SITE_OTHER): Payer: Medicare Other | Admitting: Thoracic Surgery

## 2010-04-16 ENCOUNTER — Other Ambulatory Visit: Payer: Self-pay | Admitting: Cardiovascular Disease

## 2010-04-16 ENCOUNTER — Ambulatory Visit
Admission: RE | Admit: 2010-04-16 | Discharge: 2010-04-16 | Disposition: A | Discharge status: Home / Self Care | Payer: Medicare Other | Source: Ambulatory Visit | Attending: Thoracic Surgery | Admitting: Thoracic Surgery

## 2010-04-16 DIAGNOSIS — I359 Nonrheumatic aortic valve disorder, unspecified: Secondary | ICD-10-CM

## 2010-04-16 DIAGNOSIS — J449 Chronic obstructive pulmonary disease, unspecified: Secondary | ICD-10-CM

## 2010-04-16 DIAGNOSIS — J189 Pneumonia, unspecified organism: Secondary | ICD-10-CM

## 2010-04-16 DIAGNOSIS — C3430 Malignant neoplasm of lower lobe, unspecified bronchus or lung: Secondary | ICD-10-CM

## 2010-04-16 DIAGNOSIS — C3490 Malignant neoplasm of unspecified part of unspecified bronchus or lung: Secondary | ICD-10-CM

## 2010-04-16 DIAGNOSIS — I4891 Unspecified atrial fibrillation: Secondary | ICD-10-CM

## 2010-04-16 DIAGNOSIS — E785 Hyperlipidemia, unspecified: Secondary | ICD-10-CM

## 2010-04-16 LAB — LIPID PANEL
Cholesterol: 200 mg/dL (ref 0–200)
Total CHOL/HDL Ratio: 4
VLDL: 49.4 mg/dL — ABNORMAL HIGH (ref 0.0–40.0)

## 2010-04-16 LAB — HEPATIC FUNCTION PANEL
AST: 21 U/L (ref 0–37)
Alkaline Phosphatase: 47 U/L (ref 39–117)
Bilirubin, Direct: 0.1 mg/dL (ref 0.0–0.3)
Total Bilirubin: 0.8 mg/dL (ref 0.3–1.2)

## 2010-04-16 LAB — BASIC METABOLIC PANEL
BUN: 22 mg/dL (ref 6–23)
CO2: 32 mEq/L (ref 19–32)
Calcium: 9 mg/dL (ref 8.4–10.5)
Chloride: 101 mEq/L (ref 96–112)
Creatinine, Ser: 0.9 mg/dL (ref 0.4–1.5)
Glucose, Bld: 79 mg/dL (ref 70–99)

## 2010-04-16 LAB — LDL CHOLESTEROL, DIRECT: Direct LDL: 116.9 mg/dL

## 2010-04-17 ENCOUNTER — Telehealth: Payer: Self-pay | Admitting: Cardiovascular Disease

## 2010-04-17 NOTE — Assessment & Plan Note (Signed)
OFFICE VISIT  John [name], John [name] DOB:  1933-11-12                                        April 16, 2010 CHART #:  [id number]  The patient came today and his chest x-ray is stable.  He was in hospital for pneumonia and atrial fibrillation in early February.  His blood pressure was 106/68, pulse 74, respirations 18, sats were 94% on 2 liters.  His x-rays looks much better overall.  PLAN:  We will see him again in 2 months with another chest x-ray, but overall he is doing well.  Hopefully, he is off the amiodarone, but he is on another medication for his atrial fibrillation.  Ines Bloomer, M.D. Electronically Signed  DPB/MEDQ  D:  04/16/2010  T:  04/17/2010  Job:  [id number]  cc:   Molly Maduro [name]. Foy [place], M.D.

## 2010-04-18 ENCOUNTER — Encounter: Payer: Self-pay | Admitting: Internal Medicine

## 2010-04-18 NOTE — Assessment & Plan Note (Signed)
Summary: HOSPITAL FOLLOW UP    Copy to:  Dr. [name] Primary Provider/Referring Provider:  Debby Bud  CC:  HFU.  Pt states breathing has improved.  States he is going into a fib every evening since he was discharged - only having SOB with these episodes.  Denies wheezing, chest tightness, and cough. .  History of Present Illness:  77 yo former smoker with COPD, St Jude's AVR for AI in 1994 (on warfarin). RLL nodule dx as NSCLCA in 09/2009 s/p lobectomy Dr Edwyna Shell. Course c/b hydroPTX/hemothorax.  Possible amio toxicity 01/2010-steroid responsive.  03/04/10>>Admitted 12/11 for acute resp failure, infiltrates. 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 Diuresed, treated with steroids. His breathing is much better since discharge. Otilio Carpen gets epistaxis. No longer doing vibration vest. Currently on Pred 20mg  once daily. Inhaled meds include Spiriva, Symbicort. Wearing 2L/min. exertional tolerance is good.   March 12, 2010 --Presents for an acute office visit. Complains of  increased SOB, producitve cough with clear to white mucus for last 4days. Has had fever tmax 100.7. Gets very winded with any activity.On arrival to office today with sats 79% on pulsing O2 at 2-3 l/m. He was placed on continuous flow at 4 l/m with rebound to93-94%. Last ov steroids decreased.. Pt states is taking Prednisone 10 mg.  Cough is getting worse and feel weak  March 15, 2010 --Presents for acute office visit. Returns today after seen 3 days ago for AECOPD and  RLL PNA on cxr. Tx w/  Avelox and Prednisone. Pt returns today no better, on arrival today his initial O2 sat was 69% on 2l/m continuous flow. >Admitted   04/05/10--Returns for post hospital follow up- Pt has had multiple readmissions over last 3 weeks Admitted 03/15/10 discharged x 2 briefly with readmission -last 2/17-2/20/12 for complicated   recurrent afib., previous amiodarone toxicity vs BOOP ,  Chronic systolic heart failure. Nonischemic  cardiomyopathy with ejection fraction of 40% to 45%.,  Aortic insufficiency, status post mechanical St. Jude aortic valve     replacement in 1994. and most recent  Right lower lobe squamous cell carcinoma, status post right lower  and  Transesophageal echocardiogram on March 29, 2010.  This     demonstrated an ejection fraction of 35% to 40%.  The patient also  has severe tricuspid regurgitation with no evidence of thrombus in  the left atrial appendage. Last admission started on  Tikosyn therapy.  HFU. Since discharge hse feels breathing has improved.  States he is going into a fib every evening since he was discharged - only having SOB with these episodes.   Rate goes up briefly and then back down under 90. Currently on prednisone 40mg . ESR trended down during hospital stay from 50 to 31. Appeared to be steroid responsive wth plans to taper very slowly.   Medications Prior to Update: 1)  Adult Aspirin Ec Low Strength 81 Mg  Tbec (Aspirin) .... Take 1 Tablet By Mouth Once A Day 2)  Bisoprolol Fumarate 5 Mg Tabs (Bisoprolol Fumarate) .... Take One Tablet By Mouth Two Times A Day 3)  Coumadin 5 Mg  Tabs (Warfarin Sodium) .... Take As Directed By Coumadin Clinic. 4)  Ferrous Sulfate 325 (65 Fe) Mg Tabs (Ferrous Sulfate) .... Take One  By Mouth Two Times A Day 5)  Potassium Chloride Crys Cr 20 Meq Cr-Tabs (Potassium Chloride Crys Cr) .... Take One Tablet By Mouth Daily 6)  Pepcid 20 Mg Tabs (Famotidine) .... Take One By Mouth Once Daily As  Needed 7)  Furosemide 40 Mg Tabs (Furosemide) .... Take One By Mouth Once Two Times A Day. 8)  Ventolin Hfa 108 (90 Base) Mcg/act Aers (Albuterol Sulfate) .... 2 Puffs Q4h As Needed Sob 9)  Ensure  Liqd (Nutritional Supplements) .... Three Times A Day As Needed 10)  Oxygen .... Wear 3.5l/min To 4l/min Continuously 11)  Spiriva Handihaler 18 Mcg Caps (Tiotropium Bromide Monohydrate) .... Inhale Contents of 1 Capsule Once A Day 12)  Symbicort 80-4.5 Mcg/act Aero  (Budesonide-Formoterol Fumarate) .... Inhale 2 Puffs Two Times A Day 13)  Mucinex 600 Mg Xr12h-Tab (Guaifenesin) .... Take 2 Tablets Every 12 Hours 14)  Tylenol Extra Strength 500 Mg Tabs (Acetaminophen) .... Per Bottle As Needed 15)  Prednisone 20 Mg Tabs (Prednisone) .... Take 2 Tablets Daily 16)  Lisinopril 2.5 Mg Tabs (Lisinopril) .... Take One Tablet By Mouth Daily 17)  Tikosyn 500 Mcg Caps (Dofetilide) .... Take One Capsule By Mouth Twice A Day  Current Medications (verified): 1)  Adult Aspirin Ec Low Strength 81 Mg  Tbec (Aspirin) .... Take 1 Tablet By Mouth Once A Day 2)  Bisoprolol Fumarate 5 Mg Tabs (Bisoprolol Fumarate) .... Take One Tablet By Mouth Two Times A Day 3)  Coumadin 5 Mg  Tabs (Warfarin Sodium) .... Take As Directed By Coumadin Clinic. 4)  Ferrous Sulfate 325 (65 Fe) Mg Tabs (Ferrous Sulfate) .... Take One  By Mouth Two Times A Day 5)  Potassium Chloride Crys Cr 20 Meq Cr-Tabs (Potassium Chloride Crys Cr) .... Take One Tablet By Mouth Daily 6)  Pepcid 20 Mg Tabs (Famotidine) .... Take One By Mouth Once Daily As Needed 7)  Furosemide 40 Mg Tabs (Furosemide) .... 2 Tablets Once Daily 8)  Ventolin Hfa 108 (90 Base) Mcg/act Aers (Albuterol Sulfate) .... 2 Puffs Q4h As Needed Sob 9)  Oxygen .... Wear 3.5l/min To 4l/min Continuously 10)  Spiriva Handihaler 18 Mcg Caps (Tiotropium Bromide Monohydrate) .... Inhale Contents of 1 Capsule Once A Day 11)  Symbicort 80-4.5 Mcg/act Aero (Budesonide-Formoterol Fumarate) .... Inhale 2 Puffs Two Times A Day 12)  Mucinex 600 Mg Xr12h-Tab (Guaifenesin) .... Take 2 Tablets Every 12 Hours 13)  Tylenol Extra Strength 500 Mg Tabs (Acetaminophen) .... Per Bottle As Needed 14)  Prednisone 20 Mg Tabs (Prednisone) .... Take 2 Tablets Daily 15)  Lisinopril 2.5 Mg Tabs (Lisinopril) .... Take One Tablet By Mouth Daily 16)  Tikosyn 500 Mcg Caps (Dofetilide) .... Take One Capsule By Mouth Twice A Day 17)  Ocean Nasal Spray 0.65 % Soln (Saline) ....  Daily As Needed  Allergies (verified): 1)  ! Pcn  Past History:  Past Medical History: Last updated: 01/01/2010 Current Problems:  ATRIAL FIBRILLATION (ICD-427.31) AORTIC INSUFFICIENCY (ICD-424.1) CARDIOMYOPATHY (ICD-425.4) PERIPHERAL EDEMA (ICD-782.3)Chronic lower extremity edema. 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) UNS ADVRS EFF UNS RX MEDICINAL&BIOLOGICAL SBSTNC (ICD-995.20) UMBILICAL HERNIA (ICD-553.1) SEBACEOUS CYST, NECK (ICD-706.2) UNSPECIFIED LABYRINTHITIS (ICD-386.30) DYSPEPSIA, HX OF (ICD-V12.79) SPECIAL SCREENING MALIGNANT NEOPLASM OF PROSTATE (ICD-V76.44) HERPES ZOSTER, HX OF 1996 (ICD-V13.8) BRONCHOGENIC CARCINOMA S/P LOBECTOMY 2011  Past Surgical History: Last updated: 06/19/2009 Aortic Valve Replacement: '94 - St Jude's  Appendectomy-youth repair of abdominal wall hernia  Family History: Last updated: 2009/06/19 father - deceased 38; colon cancer mother - deceased 66; aneurysm rupture - neck., CAD Neg - prostate cancer, DM,   Social History: Last updated: 04/05/2010 married '36  1 daughter  '70  work: Environmental manager, retired. Former smoker. Quit in [address].  up to 1 1/2 ppd x 40 yrs.  Risk Factors: Alcohol Use: 0 (03/12/2010) Caffeine Use: 5+ (03/12/2010) Exercise: no (03/12/2010)  Risk Factors: Smoking Status: quit < 6 months (03/12/2010) Packs/Day: .5 (03/12/2010)  Social History: married '70  1 daughter  '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 Former smoker. Quit in [id number].  up to 1 1/2 ppd x 40 yrs.  Review of Systems      See HPI  Vital Signs:  Patient profile:   77 year old male Height:      72 inches Weight:      178.25 pounds BMI:     24.26 O2 Sat:      90 % on 2 L/minpulsed Temp:     97.4 degrees F oral Pulse rate:   70 /  minute BP sitting:   110 / 70  (left arm) Cuff size:   regular  Vitals Entered By: Gweneth Dimitri RN (April 05, 2010 1:55 PM)  O2 Flow:  2 L/minpulsed CC: HFU.  Pt states breathing has improved.  States he is going into a fib every evening since he was discharged - only having SOB with these episodes.  Denies wheezing, chest tightness, cough.  Is Patient Diabetic? No Comments Medications reviewed with patient Daytime contact number verified with patient. Gweneth Dimitri RN  April 05, 2010 1:56 PM    Physical Exam  Additional Exam:  GEN: A/Ox3; pleasant , NAD, chronically ill appearing wheelchair.  HEENT:  [place]/AT, , EACs-clear, TMs-wnl, NOSE-clear, THROAT-clear NECK:  Supple w/ fair ROM; no JVD; normal carotid impulses w/o bruits; no thyromegaly or nodules palpated; no lymphadenopathy. RESP  Coarse BS , diminshed in bases , CARD:  RRR, no m/r/g , tr edema  + click (hx of AVR ) GI:   Soft & nt; nml bowel sounds; no organomegaly or masses detected. [place]co: Warm bil,  no calf tenderness  clubbing, pulses intact Neuro:intact w/ no focal deficits noted.  Skin: thin skin,scattered eccymotic areas on forearms.   Impression & Recommendations:  Problem # 1:  PNEUMONITIS (ICD-486)  Prolonged exacerbation with ?amiodarone toxicity vs BOOP w/ steroid responisve improvment currently on Prednisone 40mg  , will decrease to 30mg  and hold Unfortuantely pt was to have xray and ESR today but did not receive orders prior to leaving.  will check on return in 2 weeks.   Orders: Est. Patient Level III (29562)  Problem # 2:  ATRIAL FIBRILLATION (ICD-427.31)  recurrent afib  now improved with tikosyn  cont follow up with cards  His updated medication list for this problem includes:    Adult Aspirin Ec Low Strength 81 Mg Tbec (Aspirin) .Marland Kitchen... Take 1 tablet by mouth once a day    Bisoprolol Fumarate 5 Mg Tabs (Bisoprolol fumarate) .Marland Kitchen... Take one tablet by mouth two times a day    Coumadin 5 Mg  Tabs (Warfarin sodium) .Marland Kitchen... Take as directed by coumadin clinic.    Tikosyn 500 Mcg Caps (Dofetilide) .Marland Kitchen... Take one capsule by mouth twice a day  Orders: Est. Patient Level III (13086)  Medications Added to Medication List This Visit: 1)  Furosemide 40 Mg Tabs (Furosemide) .... 2 tablets once daily 2)  Ocean Nasal Spray 0.65 % Soln (Saline) .... Daily as needed  Patient Instructions: 1)  Prednisone 20mg  1/12 (30mg  ) once daily and hold 2)   follow up Dr. Delton Coombes in 2 weeks as planned. and as needed

## 2010-04-18 NOTE — Medication Information (Addendum)
Summary: Coumadin Clinic  Anticoagulant Therapy  Managed by: Cloyde Reams, RN, BSN Referring MD: [name] PCP: Link Snuffer MD: Tenny Craw MD, Gunnar Fusi Indication 1: Aortic Valve Replacement (ICD-V43.3) Indication 2: Aortic Valve Disorder (ICD-424.1) Lab Used: [facility] [place] [facility] [place] Site: [address] PT 54.1 INR POC 4.5 INR RANGE 2.5 - 3.5  Dietary changes: no     Bleeding/hemorrhagic complications: no     Any changes in medication regimen? no     Any missed doses?: no       Is patient compliant with meds? yes       Allergies: 1)  ! Pcn  Anticoagulation Management History:      His anticoagulation is being managed by telephone today.  Positive risk factors for bleeding include an age of 75 years or older.  The bleeding index is 'intermediate risk'.  Positive CHADS2 values include History of HTN.  Negative CHADS2 values include Age > 75 years old.  The start date was 09/05/1998.  His last INR was 5.9.  Prothrombin time is 54.1.  Anticoagulation responsible provider: Tenny Craw MD, Gunnar Fusi.  INR POC: 4.5.  Exp: 03/2011.    Anticoagulation Management Assessment/Plan:      The patient's current anticoagulation dose is Warfarin sodium 2.5 mg tabs: Use as directed by Anticoagualtion Clinic.  The target INR is 2.5-3.5.  The next INR is due 04/18/2010.  Anticoagulation instructions were given to home health nurse.  Results were reviewed/authorized by Cloyde Reams, RN, BSN.  He was notified by Cloyde Reams RN.         Prior Anticoagulation Instructions: INR 2.5  Spoke with Kathie Rhodes, [facility] RN while at pt's home.  Advised to continue on same dosage 2.5mg  daily.  Recheck in 1 week.    Current Anticoagulation Instructions: INR 4.5  Called spoke with pt's wife advised to hold Coumadin x 1 dose, then start taking 2.5mg  daily except 1.25mg  on MF.  Pt's Coumadin tablet changed from 5mg  to 2.5mg  pt and wife aware.  New rx called to [facility] drug per their request.  Recheck in 1 week.   [place], RN [facility] gave verbal order to recheck on 04/18/10 and new dosage instructions.   Prescriptions: WARFARIN SODIUM 2.5 MG TABS (WARFARIN SODIUM) Use as directed by Anticoagualtion Clinic  #40 x 3   Entered by:   Cloyde Reams RN   Authorized by:   Norva Karvonen, MD   Signed by:   Cloyde Reams RN on 04/11/2010   Method used:   Electronically to        [facility] (retail)       [address]       [place], [place]  [address]       Ph: [phone] or [phone]       Fax: [phone]   RxID:   [id number]

## 2010-04-18 NOTE — Discharge Summary (Signed)
NAMEPERKINS, MOLINA               ACCOUNT NO.:  [id number]  MEDICAL RECORD NO.:  [id number]           PATIENT TYPE:  I  LOCATION:  2037                         FACILITY:  [facility]  PHYSICIAN:  Veverly Fells. Excell Seltzer, MD  DATE OF BIRTH:  Jul 03, 1933  DATE OF ADMISSION:  03/22/2010 DATE OF DISCHARGE:  03/25/2010                              DISCHARGE SUMMARY   PRIMARY CARDIOLOGIST:  Veverly Fells. Excell Seltzer, MD  ELECTROPHYSIOLOGIST:  Doylene Canning. Ladona Ridgel, MD  PRIMARY CARE PHYSICIAN:  Rosalyn Gess. Norins, MD  DISCHARGE DIAGNOSIS:   1.  Atrial fibrillation with rapid ventricular response.     a.  Rate control on bisoprolol and diltiazem.     b.  Failed cardioversion in September 2011, started on amiodarone.     c.  Cardioversion on January 10, 2010, was successful on amiodarone.     d.  Chronic Coumadin therapy.     e.  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 2.  Acute on chronic mixed congestive heart failure/nonischemic cardiomyopathy with an ejection fraction of 40% to 45% per echocardiogram.  SECONDARY DIAGNOSES: 1. Aortic insufficiency, status post mechanical St. Jude aortic valve     replacement in 1994. 2. Right lower lobe squamous cell carcinoma status post video-assisted     thoracoscopic surgery and right lower lobe lobectomy in December     2011. 3. Chronic obstructive pulmonary disease on 4 L of home oxygen. 4. Hypertension. 5. Hyperlipidemia. 6. Pulmonary hypertension.  ALLERGIES:  PENICILLIN.  PROCEDURES/DIAGNOSTICS PERFORMED DURING HOSPITALIZATION:   Chest x-ray showing cardiomegaly.  There is persistent right lung base opacity.  REASON FOR HOSPITALIZATION:  This is a 77 year old male with a complicated past medical history.  The patient states he was in his usual state of health without shaking his oxygen saturations and noted that his monitor showed heart rate in the 140s.  The patient presented to the emergency department  for further evaluation.  He denied any palpitations, increased shortness of breath, or chest pain.  EKG in the emergency department showed atypical atrial flutter with rates of 122. No ST-T-wave abnormalities noted.  The patient was treated with IV Cardizem.  He was admitted for further treatment.  HOSPITAL COURSE:  The patient was admitted to the step-down unit and continued to be monitored on telemetry.  His INR was subtherapeutic at 1.85 and therefore IV heparin was initiated.  Cardizem did help control the patient's rate.  He remained in atrial fibrillation/flutter. Electrophysiologist, Dr. Ladona Ridgel, evaluated the patient and noted the patient can switch to oral Cardizem.  An amiodarone level was drawn and this is currently pending.  With the patient being rate controlled on Cardizem and a beta-blocker.  It was felt the patient could be discharged with outpatient follow up.  Dr. Ladona Ridgel has noted that if the patient's amiodarone level is low enough that we can initiate a Tikosyn load in the near future.  The patient's INR is 2.3 on day of discharge and his Coumadin will be continued.  The patient was mildly volume overloaded during admission.  He was taking Lasix orally twice  daily.  The patient requested Lasix dose to be changed to once daily.  Therefore, he will be discharged on Lasix 80 mg daily.  His heart failure did improve during admission.  He had no complaints of chest pain or dyspnea.  He was feeling okay on day of discharge.  He will have followup BMET and BNP when he returns to the clinic.  On day of discharge, Dr. Excell Seltzer evaluated the patient and noted him stable for home with close outpatient follow up. Discharge plans and instructions were discussed with the patient and he voiced understanding.  Of note, the patient would [facility]  They will actually be drawing his INRs and send them to the Coumadin Clinic for  evaluation.  DISCHARGE LABORATORY DATA:  WBC 18.9, hemoglobin 11.1, hematocrit 35.3, platelets 280,000.  INR 2.32.  DISCHARGE MEDICATIONS: 1. Diltiazem CD 240 mg 1 tablet daily. 2. Furosemide 40 mg 2 tablets daily. 3. Aspirin 81 mg 1 tablet daily. 4. Bisoprolol 5 mg 1 tablet twice daily. 5. Coumadin 2.5 mg 1 tablet daily. 6. Ensure liquid nutritional supplement 3 times a day. 7. Ferrous sulfate 325 mg 1 tablet twice daily. 8. Mucinex 600 mg 2 tablets every 12 hours. 9. Pepcid 20 mg 1 tablet daily as needed for acid reflux. 10.Potassium chloride 20 mEq 1 tablet daily. 11.Prednisone 20 mg 2 tablets daily with meals. 12.Spiriva 18 mcg 1 capsule daily inhaled. 13.Symbicort 80/4.5 mcg 2 puffs inhaled twice daily. 14.Sodium chloride 0.65% nasal spray, 1 spray as needed. 15.Tylenol Extra Strength 500 mg 1-2 tablets daily as needed for pain. 16.Ventolin inhaler 90 mg 2 puffs inhaled every 4 hours as needed for     shortness of breath.  FOLLOWUP PLANS AND APPOINTMENTS: 1. The patient will follow up with Dr. Ladona Ridgel on April 10, 2010, at     9:45.  He will have a BMET and a BNP drawn at this time. 2. The patient will have home health draw an INR and send to the     [facility] on March 27, 2010. 3. The patient to increase activity as tolerated. 4. The patient is to continue low-sodium, heart-healthy diet. 5. The patient to call our office in the interim if there are any     problems.  Duration of discharge greater than 30 minutes with physician and physician extender time.     Leonette Monarch, PA-C   ______________________________ Veverly Fells. Excell Seltzer, MD    NB/MEDQ  D:  03/25/2010  T:  03/26/2010  Job:  [id number]  cc:   Rosalyn Gess. Norins, MD Doylene Canning. Ladona Ridgel, MD  Electronically Signed by Alen Blew P.A. on 03/29/2010 01:00:29 PM Electronically Signed by [name] MD on 04/16/2010 08:55:23 PM

## 2010-04-22 LAB — BASIC METABOLIC PANEL
CO2: 26 mEq/L (ref 19–32)
Calcium: 9.5 mg/dL (ref 8.4–10.5)
Chloride: 105 mEq/L (ref 96–112)
Chloride: 98 mEq/L (ref 96–112)
Creatinine, Ser: 1.23 mg/dL (ref 0.4–1.5)
GFR calc Af Amer: >60 mL/min (ref >60)
GFR calc Af Amer: >60 mL/min (ref >60)
Glucose, Bld: 131 mg/dL — ABNORMAL HIGH (ref 70–99)
Potassium: 5.1 mEq/L (ref 3.5–5.1)
Sodium: 136 mEq/L (ref 135–145)
Sodium: 138 mEq/L (ref 135–145)

## 2010-04-22 LAB — CBC
HCT: 33.3 % — ABNORMAL LOW (ref 39.0–52.0)
Hemoglobin: 10.1 g/dL — ABNORMAL LOW (ref 13.0–17.0)
MCV: 88.9 fL (ref 78.0–100.0)
Platelets: 318 10*3/uL (ref 150–400)
Platelets: 333 10*3/uL (ref 150–400)
Platelets: 339 10*3/uL (ref 150–400)
RBC: 3.8 MIL/uL — ABNORMAL LOW (ref 4.22–5.81)
RBC: 3.86 MIL/uL — ABNORMAL LOW (ref 4.22–5.81)
RDW: 18 % — ABNORMAL HIGH (ref 11.5–15.5)
RDW: 18.1 % — ABNORMAL HIGH (ref 11.5–15.5)
WBC: 13.3 10*3/uL — ABNORMAL HIGH (ref 4.0–10.5)
WBC: 13.6 10*3/uL — ABNORMAL HIGH (ref 4.0–10.5)
WBC: 13.9 10*3/uL — ABNORMAL HIGH (ref 4.0–10.5)

## 2010-04-22 LAB — PROTIME-INR
INR: 3.05 — ABNORMAL HIGH (ref 0.00–1.49)
INR: 3.84 — ABNORMAL HIGH (ref 0.00–1.49)
INR: 4.27 — ABNORMAL HIGH (ref 0.00–1.49)
Prothrombin Time: 29.2 seconds — ABNORMAL HIGH (ref 11.6–15.2)
Prothrombin Time: 37.4 seconds — ABNORMAL HIGH (ref 11.6–15.2)

## 2010-04-22 LAB — BRAIN NATRIURETIC PEPTIDE: Pro B Natriuretic peptide (BNP): 556 pg/mL — ABNORMAL HIGH (ref 0.0–100.0)

## 2010-04-23 LAB — TSH: TSH: 4.303 u[IU]/mL (ref 0.350–4.500)

## 2010-04-23 LAB — CARDIAC PANEL(CRET KIN+CKTOT+MB+TROPI)
CK, MB: 2.9 ng/mL (ref 0.3–4.0)
Relative Index: INVALID (ref 0.0–2.5)
Total CK: 62 U/L (ref 7–232)
Troponin I: 0.02 ng/mL (ref 0.00–0.06)

## 2010-04-23 LAB — CBC
HCT: 35.7 % — ABNORMAL LOW (ref 39.0–52.0)
HCT: 36 % — ABNORMAL LOW (ref 39.0–52.0)
MCHC: 29.4 g/dL — ABNORMAL LOW (ref 30.0–36.0)
MCHC: 29.7 g/dL — ABNORMAL LOW (ref 30.0–36.0)
Platelets: 262 10*3/uL (ref 150–400)
RDW: 18.4 % — ABNORMAL HIGH (ref 11.5–15.5)
RDW: 18.4 % — ABNORMAL HIGH (ref 11.5–15.5)
WBC: 8.9 10*3/uL (ref 4.0–10.5)

## 2010-04-23 LAB — D-DIMER, QUANTITATIVE: D-Dimer, Quant: 1.55 ug/mL-FEU — ABNORMAL HIGH (ref 0.00–0.48)

## 2010-04-23 LAB — BASIC METABOLIC PANEL
BUN: 13 mg/dL (ref 6–23)
Calcium: 9.7 mg/dL (ref 8.4–10.5)
Creatinine, Ser: 1.12 mg/dL (ref 0.4–1.5)
GFR calc non Af Amer: >60 mL/min (ref >60)
Glucose, Bld: 100 mg/dL — ABNORMAL HIGH (ref 70–99)

## 2010-04-23 LAB — BLOOD GAS, ARTERIAL
Acid-Base Excess: 4.4 mmol/L — ABNORMAL HIGH (ref 0.0–2.0)
Drawn by: 31996
O2 Content: 5 L/min
Patient temperature: 98.6
TCO2: 29.1 mmol/L (ref 0–100)
pH, Arterial: 7.476 — ABNORMAL HIGH (ref 7.350–7.450)

## 2010-04-23 LAB — PROTIME-INR
INR: 3.24 — ABNORMAL HIGH (ref 0.00–1.49)
INR: 4.24 — ABNORMAL HIGH (ref 0.00–1.49)
Prothrombin Time: 40.7 seconds — ABNORMAL HIGH (ref 11.6–15.2)

## 2010-04-23 LAB — COMPREHENSIVE METABOLIC PANEL
ALT: 13 U/L (ref 0–53)
Albumin: 3.2 g/dL — ABNORMAL LOW (ref 3.5–5.2)
Calcium: 9.2 mg/dL (ref 8.4–10.5)
GFR calc Af Amer: >60 mL/min (ref >60)
Glucose, Bld: 91 mg/dL (ref 70–99)
Potassium: 4.9 mEq/L (ref 3.5–5.1)
Sodium: 136 mEq/L (ref 135–145)
Total Protein: 8.7 g/dL — ABNORMAL HIGH (ref 6.0–8.3)

## 2010-04-23 LAB — SEDIMENTATION RATE: Sed Rate: 69 mm/hr — ABNORMAL HIGH (ref 0–16)

## 2010-04-23 LAB — BRAIN NATRIURETIC PEPTIDE: Pro B Natriuretic peptide (BNP): 416 pg/mL — ABNORMAL HIGH (ref 0.0–100.0)

## 2010-04-23 NOTE — Miscellaneous (Signed)
[facility]   Imported By: Kassie Mends 04/17/2010 10:33:15  _____________________________________________________________________  External Attachment:    Type:   Image     Comment:   External Document

## 2010-04-23 NOTE — Miscellaneous (Signed)
Summary: [facility] Orders   [facility] Orders   Imported By: Roderic Ovens 04/18/2010 09:29:08  _____________________________________________________________________  External Attachment:    Type:   Image     Comment:   External Document

## 2010-04-23 NOTE — Assessment & Plan Note (Signed)
Summary: COPD, COP, A Fib, NSCLCA   Visit Type:  Follow-up Copy to:  Dr. [name] Primary Provider/Referring Provider:  Debby Bud  CC:  Pneumonia follow-up.Marland KitchenMarland KitchenCXR done w/ Dr. Edwyna Shell today...pt says his breathing has improved...denies any cough.  History of Present Illness:  77 yo former smoker with COPD, St Jude's AVR for AI in 1994 (on warfarin). RLL nodule dx as NSCLCA in 09/2009 s/p lobectomy Dr Edwyna Shell. Course c/b hydroPTX/hemothorax.  Possible amio toxicity 01/2010-steroid responsive.  03/04/10>>Admitted 12/11 for acute resp failure, infiltrates. 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 Diuresed, treated with steroids. His breathing is much better since discharge. Otilio Carpen gets epistaxis. No longer doing vibration vest. Currently on Pred 20mg  once daily. Inhaled meds include Spiriva, Symbicort. Wearing 2L/min. exertional tolerance is good.   March 12, 2010 --Presents for an acute office visit. Complains of  increased SOB, producitve cough with clear to white mucus for last 4days. Has had fever tmax 100.7. Gets very winded with any activity.On arrival to office today with sats 79% on pulsing O2 at 2-3 l/m. He was placed on continuous flow at 4 l/m with rebound to93-94%. Last ov steroids decreased.. Pt states is taking Prednisone 10 mg.  Cough is getting worse and feel weak  March 15, 2010 --Presents for acute office visit. Returns today after seen 3 days ago for AECOPD and  RLL PNA on cxr. Tx w/  Avelox and Prednisone. Pt returns today no better, on arrival today his initial O2 sat was 69% on 2l/m continuous flow. >Admitted    04/05/10--Returns for post hospital follow up- Pt has had multiple readmissions over last 3 weeks Admitted 03/15/10 discharged x 2 briefly with readmission -last 2/17-2/20/12 for complicated   recurrent afib., previous amiodarone toxicity vs BOOP ,  Chronic systolic heart failure. Nonischemic cardiomyopathy with ejection fraction of 40% to 45%.,  Aortic  insufficiency, status post mechanical St. Jude aortic valve replacement in 1994. and most recent  Right lower lobe squamous cell carcinoma, status post right lower  and  Transesophageal echocardiogram on March 29, 2010.  This demonstrated an ejection fraction of 35% to 40%.  The patient also  has severe tricuspid regurgitation with no evidence of thrombus in  the left atrial appendage. Last admission started on  Tikosyn therapy.  HFU. Since discharge hse feels breathing has improved.  States he is going into a fib every evening since he was discharged - only having SOB with these episodes.   Rate goes up briefly and then back down under 90. Currently on prednisone 40mg . ESR trended down during hospital stay from 50 to 31. Appeared to be steroid responsive wth plans to taper very slowly.   ROV 04/16/10 -- Complicated pt w hx COPD, NCSLCA s/p resection. Parox A Fib that has been assoc with vol overload and dyspnea. Also w pulm infilgtrates, ? 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 Has been in and out of hosp in Feb due to exacerbations, RVR, etc. Last d/c was 2/20, on Pred 30mg  once daily currently. Feels that his HR is better controlled on Tikosyn.   Current Medications (verified): 1)  Adult Aspirin Ec Low Strength 81 Mg  Tbec (Aspirin) .... Take 1 Tablet By Mouth Once A Day 2)  Bisoprolol Fumarate 5 Mg Tabs (Bisoprolol Fumarate) .... Take One Tablet By Mouth Two Times A Day 3)  Warfarin Sodium 2.5 Mg Tabs (Warfarin Sodium) .... Use As Directed By Anticoagualtion Clinic 4)  Ferrous Sulfate 325 (65 Fe) Mg Tabs (Ferrous Sulfate) .... Take One  By Mouth Two Times A Day 5)  Potassium Chloride Crys Cr 20 Meq Cr-Tabs (Potassium Chloride Crys Cr) .... Take One Tablet By Mouth Daily 6)  Pepcid 20 Mg Tabs (Famotidine) .... Take One By Mouth Once Daily As Needed 7)  Furosemide 40 Mg Tabs (Furosemide) .... 2 Tablets Once Daily 8)  Ventolin Hfa 108 (90 Base) Mcg/act Aers (Albuterol Sulfate) .... 2 Puffs Q4h As Needed Sob 9)  Oxygen  .... Wear 3.5l/min To 4l/min Continuously 10)  Spiriva Handihaler 18 Mcg Caps (Tiotropium Bromide Monohydrate) .... Inhale Contents of 1 Capsule Once A Day 11)  Symbicort 80-4.5 Mcg/act Aero (Budesonide-Formoterol Fumarate) .... Inhale 2 Puffs Two Times A Day 12)  Mucinex 600 Mg Xr12h-Tab (Guaifenesin) .... Take 2 Tablets Every 12 Hours 13)  Tylenol Extra Strength 500 Mg Tabs (Acetaminophen) .... Per Bottle As Needed 14)  Prednisone 20 Mg Tabs (Prednisone) .... Take 1 1/2 Tablets Daily 15)  Lisinopril 2.5 Mg Tabs (Lisinopril) .... Take One Tablet By Mouth Daily 16)  Tikosyn 500 Mcg Caps (Dofetilide) .... Take One Capsule By Mouth Twice A Day 17)  Ocean Nasal Spray 0.65 % Soln (Saline) .... Daily As Needed  Allergies (verified): 1)  ! Pcn  Vital Signs:  Patient profile:   77 year old male Height:      72 inches (182.88 cm) Weight:      182 pounds (82.73 kg) BMI:     24.77 O2 Sat:      92 % on 2 L/min Temp:     97.7 degrees F (36.50 degrees C) oral Pulse rate:   72 / minute BP sitting:   120 / 76  (left arm) Cuff size:   regular  Vitals Entered By: Michel Bickers CMA (April 16, 2010 2:40 PM)  O2 Sat at Rest %:  92 O2 Flow:  2 L/min CC: Pneumonia follow-up.Marland KitchenMarland KitchenCXR done w/ Dr. Edwyna Shell today...pt says his breathing has improved...denies any cough Comments Medications reviewed with patient Michel Bickers CMA  April 16, 2010 2:41 PM   Physical Exam  General:  normal appearance and healthy appearing.   Head:  normocephalic and atraumatic Nose:  no deformity, discharge, inflammation, or lesions Mouth:  no deformity or lesions Neck:  no masses, thyromegaly, or abnormal cervical nodes Lungs:  clear bilaterally to auscultation and percussion Heart:  regular rhythm and normal rate.  3/6 systolic M Abdomen:  not examined Msk:  no deformity or scoliosis noted with normal posture Extremities:  no edema Neurologic:  non-focal Skin:  intact without lesions or rashes Psych:  alert and  cooperative; normal mood and affect; normal attention span and concentration   Impression & Recommendations:  Problem # 1:  COPD (ICD-496)  Problem # 2:  CARCINOMA, LUNG, NONSMALL CELL (ICD-162.9)  Problem # 3:  PNEUMONITIS (ICD-486) ? Amio vs COPD - wean Pred to off.   Problem # 4:  ATRIAL FIBRILLATION (ICD-427.31)  His updated medication list for this problem includes:    Adult Aspirin Ec Low Strength 81 Mg Tbec (Aspirin) .Marland Kitchen... Take 1 tablet by mouth once a day    Bisoprolol Fumarate 5 Mg Tabs (Bisoprolol fumarate) .Marland Kitchen... Take one tablet by mouth two times a day    Warfarin Sodium 2.5 Mg Tabs (Warfarin sodium) ..... Use as directed by anticoagualtion clinic    Tikosyn 500 Mcg Caps (Dofetilide) .Marland Kitchen... Take one capsule by mouth twice a day  Orders: Est. Patient Level IV (16109)  Other Orders: T-2 View CXR (71020TC)  Patient Instructions:  1)  Decrease your prednisone to 20mg  by mouth once daily x 1 week, then 10mg  once daily x 1 week, then stop.  2)  Continue your oxygen at all times.  3)  Continue your Spiriva and Symbicort as you are taking them.  4)  Stop mucinex.  5)  Have Ventolin available to use as needed  6)  Follow up with Dr Delton Coombes in 3 weeks with a CXR

## 2010-04-23 NOTE — Medication Information (Signed)
Summary: Coumadin Clinic  Anticoagulant Therapy  Managed by: Weston Brass, PharmD Referring MD: [name] PCP: Link Snuffer MD: Ladona Ridgel MD, Sharlot Gowda Indication 1: Aortic Valve Replacement (ICD-V43.3) Indication 2: Aortic Valve Disorder (ICD-424.1) Lab Used: [facility] [place] [facility] [place] Site: [address] PT 27.9 INR POC 2.3 INR RANGE 2.5 - 3.5  Dietary changes: no    Health status changes: no    Bleeding/hemorrhagic complications: no    Recent/future hospitalizations: no    Any changes in medication regimen? yes       Details: added pravastatin and decreased prednisone to 20mg  daily   Recent/future dental: no  Any missed doses?: no       Is patient compliant with meds? yes       Allergies: 1)  ! Pcn  Anticoagulation Management History:      His anticoagulation is being managed by telephone today.  Positive risk factors for bleeding include an age of 60 years or older.  The bleeding index is 'intermediate risk'.  Positive CHADS2 values include History of HTN.  Negative CHADS2 values include Age > 83 years old.  The start date was 09/05/1998.  His last INR was 5.9.  Prothrombin time is 27.9.  Anticoagulation responsible provider: Ladona Ridgel MD, Sharlot Gowda.  INR POC: 2.3.  Exp: 03/2011.    Anticoagulation Management Assessment/Plan:      The patient's current anticoagulation dose is Warfarin sodium 2.5 mg tabs: Use as directed by Anticoagualtion Clinic.  The target INR is 2.5-3.5.  The next INR is due 04/25/2010.  Anticoagulation instructions were given to home health nurse.  Results were reviewed/authorized by Weston Brass, PharmD.  He was notified by Weston Brass PharmD.         Prior Anticoagulation Instructions: INR 4.5  Called spoke with pt's wife advised to hold Coumadin x 1 dose, then start taking 2.5mg  daily except 1.25mg  on MF.  Pt's Coumadin tablet changed from 5mg  to 2.5mg  pt and wife aware.  New rx called to [facility] drug per their request.  Recheck in 1 week.  [place], RN [facility] gave verbal order to recheck on 04/18/10 and new dosage instructions.    Current Anticoagulation Instructions: INR 2.3  Spoke with Rosa with [facility] while in pt's home.  Take 1 1/2 tablets today then resume same dose of 1 tablet every day except 1/2 tablet on Monday and Friday.  Recheck INR in 1 week.

## 2010-04-23 NOTE — Progress Notes (Signed)
Summary: lab results  Phone Note Call from Patient Call back at Home Phone [phone]   Caller: Patient Reason for Call: Talk to Nurse, Talk to Doctor, Lab or Test Results Summary of Call: pt rtn call to Lauren to get lab resutls Initial call taken by: Omer Jack,  April 17, 2010 12:41 PM  Follow-up for Phone Call        Phone Call Completed, Rx Called In:  Spoke to patient-advised per Dr. Excell Seltzer, need to start on Pravastatin 40mg  for elevated cholesterol.  Given patient the numbers of results. Follow-up by: Dessie Coma  LPN,  April 17, 2010 12:54 PM

## 2010-04-24 NOTE — Discharge Summary (Signed)
John, [name]               ACCOUNT NO.:  [id number]  MEDICAL RECORD NO.:  [id number]           PATIENT TYPE:  I  LOCATION:  2005                         FACILITY:  [facility]  PHYSICIAN:  Doylene Canning. Ladona Ridgel, MD    DATE OF BIRTH:  1934/02/10  DATE OF ADMISSION:  03/29/2010 DATE OF DISCHARGE:  04/01/2010                              DISCHARGE SUMMARY   PRIMARY CARE PHYSICIAN:  Rosalyn Gess. Norins, MD  PRIMARY CARDIOLOGIST:  Veverly Fells. Excell Seltzer, MD  ELECTROPHYSIOLOGIST:  Doylene Canning. Ladona Ridgel, MD  PRIMARY DIAGNOSIS:  Atrial fibrillation.  SECONDARY DIAGNOSES: 1. Previous amiodarone toxicity. 2. Chronic systolic heart failure. 3. Nonischemic cardiomyopathy with ejection fraction of 40% to 45%. 4. Aortic insufficiency, status post mechanical St. Jude aortic valve     replacement in 1994. 5. Right lower lobe squamous cell carcinoma, status post right lower     lobe lobectomy in December 2011. 6. Chronic obstructive pulmonary disease on 3 L of oxygen. 7. Hypertension. 8. Hyperlipidemia. 9. Pulmonary hypertension.  ALLERGIES:  The patient is allergic to PENICILLIN.  PROCEDURES THIS ADMISSION: 1. Transesophageal echocardiogram on March 29, 2010.  This     demonstrated an ejection fraction of 35% to 40%.  The patient also     has severe tricuspid regurgitation with no evidence of thrombus in     the left atrial appendage. 2. Tikosyn loading.  BRIEF HISTORY OF PRESENT ILLNESS:  [name] is a 77 year old male with a history of atrial fibrillation which has previously been treated with amiodarone.  Unfortunately, he developed amiodarone lung toxicity and his medications had to be discontinued.  Dr. Ladona Ridgel discussed treatment options with the patient including Tikosyn initiation.  This was planned for March 29, 2010, after TEE demonstrated no clot in the left atrium.  HOSPITAL COURSE:  The patient was admitted on March 29, 2010, for planned initiation of Tikosyn therapy.  He  was placed on Tikosyn 500 mcg every 12 hours.  The patient converted to sinus rhythm spontaneously on March 30, 2010.  He maintained sinus rhythm thereafter with rates in the 50s and 60s.  His QTc intervals were monitored and found to be less than 500 milliseconds.  His renal function was also followed.  Dr. Ladona Ridgel examined the patient on April 01, 2010, and considered him stable for discharge.  LABORATORY DATA:  Potassium 4.9, sodium 137, BUN 21, creatinine 1.0, INR 2.45, magnesium 2.4, hemoglobin 9.9, hematocrit 31.3.  DISCHARGE MEDICATIONS: 1. Tikosyn 500 mcg one tablet twice daily - this is a new prescription     for the patient. 2. Lisinopril 2.5 mg one tablet daily - this is a new prescription for     the patient. 3. Aspirin - 81 mg daily. 4. Bisoprolol 5 mg twice daily. 5. Warfarin as directed by [facility]. 6. Ferrous sulfate 325 mg twice daily. 7. Furosemide 40 mg 2 tablets daily. 8. Mucinex 600 mg 2 tablets every 12 hours. 9. Pepcid 20 mg daily as needed for acid reflux. 10.Potassium chloride 20 mEq daily. 11.Prednisone 20 mg 2 tablets daily with meals. 12.Spiriva 18 mcg daily. 13.Symbicort 2 puffs twice  daily. 14.Saline nasal spray daily as needed. 15.Tylenol 500 mg 1-2 tablets every 6 hours as needed for pain. 16.Ventolin inhaler 2 puffs every 4 hours as needed for shortness of     breath.  Of note, the patient's diltiazem was discontinued this admission.  This had been previously prescribed for rate control and as the patient is in sinus rhythm, is not necessary.  FOLLOWUP APPOINTMENTS: 1. Dr. [name] on April 12, 2010, at 3:15 p.m. 2. Dr. Debby Bud as scheduled. 3. [facility] as scheduled.  DISPOSITION:  The patient was seen and examined by Dr.  Ladona Ridgel on April 01, 2010, considered stable for discharge.  DURATION OF DISCHARGE ENCOUNTER:  Thiry five minutes.     Gypsy Balsam,  RN,BSN   ______________________________ Doylene Canning. Ladona Ridgel, MD    AS/MEDQ  D:  04/01/2010  T:  04/01/2010  Job:  [id number]  cc:   Veverly Fells. Excell Seltzer, MD Rosalyn Gess. Norins, MD  Electronically Signed by Gypsy Balsam RNBSN on 04/08/2010 02:46:49 PM Electronically Signed by Lewayne Bunting MD on 04/24/2010 04:35:16 PM

## 2010-04-25 ENCOUNTER — Encounter: Payer: Self-pay | Admitting: Internal Medicine

## 2010-04-25 LAB — COMPREHENSIVE METABOLIC PANEL
ALT: 23 U/L (ref 0–53)
ALT: 14 U/L (ref 0–53)
ALT: 18 U/L (ref 0–53)
ALT: 26 U/L (ref 0–53)
AST: 23 U/L (ref 0–37)
AST: 25 U/L (ref 0–37)
AST: 34 U/L (ref 0–37)
Albumin: 2.5 g/dL — ABNORMAL LOW (ref 3.5–5.2)
Alkaline Phosphatase: 59 U/L (ref 39–117)
Alkaline Phosphatase: 60 U/L (ref 39–117)
Alkaline Phosphatase: 65 U/L (ref 39–117)
Alkaline Phosphatase: 35 U/L — ABNORMAL LOW (ref 39–117)
Alkaline Phosphatase: 40 U/L (ref 39–117)
Alkaline Phosphatase: 42 U/L (ref 39–117)
BUN: 30 mg/dL — ABNORMAL HIGH (ref 6–23)
BUN: 15 mg/dL (ref 6–23)
BUN: 19 mg/dL (ref 6–23)
BUN: 8 mg/dL (ref 6–23)
CO2: 30 mEq/L (ref 19–32)
CO2: 34 mEq/L — ABNORMAL HIGH (ref 19–32)
CO2: 34 mEq/L — ABNORMAL HIGH (ref 19–32)
CO2: 24 mEq/L (ref 19–32)
CO2: 26 mEq/L (ref 19–32)
CO2: 30 mEq/L (ref 19–32)
Calcium: 8.1 mg/dL — ABNORMAL LOW (ref 8.4–10.5)
Calcium: 9.3 mg/dL (ref 8.4–10.5)
Chloride: 101 mEq/L (ref 96–112)
Chloride: 104 mEq/L (ref 96–112)
Chloride: 102 mEq/L (ref 96–112)
Chloride: 108 mEq/L (ref 96–112)
Creatinine, Ser: 1.06 mg/dL (ref 0.4–1.5)
Creatinine, Ser: 1.05 mg/dL (ref 0.4–1.5)
GFR calc Af Amer: >60 mL/min (ref >60)
GFR calc non Af Amer: >60 mL/min (ref >60)
GFR calc non Af Amer: >60 mL/min (ref >60)
GFR calc non Af Amer: >60 mL/min (ref >60)
GFR calc non Af Amer: >60 mL/min (ref >60)
GFR calc non Af Amer: >60 mL/min (ref >60)
Glucose, Bld: 83 mg/dL (ref 70–99)
Glucose, Bld: 100 mg/dL — ABNORMAL HIGH (ref 70–99)
Glucose, Bld: 103 mg/dL — ABNORMAL HIGH (ref 70–99)
Glucose, Bld: 118 mg/dL — ABNORMAL HIGH (ref 70–99)
Glucose, Bld: 130 mg/dL — ABNORMAL HIGH (ref 70–99)
Potassium: 3.9 mEq/L (ref 3.5–5.1)
Potassium: 4.4 mEq/L (ref 3.5–5.1)
Potassium: 4.5 mEq/L (ref 3.5–5.1)
Potassium: 4.1 mEq/L (ref 3.5–5.1)
Potassium: 4.5 mEq/L (ref 3.5–5.1)
Sodium: 138 mEq/L (ref 135–145)
Sodium: 137 mEq/L (ref 135–145)
Sodium: 137 mEq/L (ref 135–145)
Sodium: 140 mEq/L (ref 135–145)
Total Bilirubin: 0.6 mg/dL (ref 0.3–1.2)
Total Bilirubin: 0.7 mg/dL (ref 0.3–1.2)
Total Bilirubin: 0.9 mg/dL (ref 0.3–1.2)
Total Bilirubin: 1.3 mg/dL — ABNORMAL HIGH (ref 0.3–1.2)
Total Protein: 5.5 g/dL — ABNORMAL LOW (ref 6.0–8.3)
Total Protein: 5.2 g/dL — ABNORMAL LOW (ref 6.0–8.3)
Total Protein: 7.5 g/dL (ref 6.0–8.3)

## 2010-04-25 LAB — CBC
HCT: 25 % — ABNORMAL LOW (ref 39.0–52.0)
HCT: 25.7 % — ABNORMAL LOW (ref 39.0–52.0)
HCT: 26 % — ABNORMAL LOW (ref 39.0–52.0)
HCT: 27.6 % — ABNORMAL LOW (ref 39.0–52.0)
HCT: 31.2 % — ABNORMAL LOW (ref 39.0–52.0)
HCT: 32.1 % — ABNORMAL LOW (ref 39.0–52.0)
HCT: 24.1 % — ABNORMAL LOW (ref 39.0–52.0)
HCT: 24.9 % — ABNORMAL LOW (ref 39.0–52.0)
HCT: 25 % — ABNORMAL LOW (ref 39.0–52.0)
HCT: 25 % — ABNORMAL LOW (ref 39.0–52.0)
HCT: 26 % — ABNORMAL LOW (ref 39.0–52.0)
HCT: 40.4 % (ref 39.0–52.0)
Hemoglobin: 7.4 g/dL — ABNORMAL LOW (ref 13.0–17.0)
Hemoglobin: 7.9 g/dL — ABNORMAL LOW (ref 13.0–17.0)
Hemoglobin: 8 g/dL — ABNORMAL LOW (ref 13.0–17.0)
Hemoglobin: 9.4 g/dL — ABNORMAL LOW (ref 13.0–17.0)
Hemoglobin: 9.8 g/dL — ABNORMAL LOW (ref 13.0–17.0)
Hemoglobin: 10.8 g/dL — ABNORMAL LOW (ref 13.0–17.0)
Hemoglobin: 13.7 g/dL (ref 13.0–17.0)
Hemoglobin: 8.4 g/dL — ABNORMAL LOW (ref 13.0–17.0)
Hemoglobin: 8.5 g/dL — ABNORMAL LOW (ref 13.0–17.0)
Hemoglobin: 8.8 g/dL — ABNORMAL LOW (ref 13.0–17.0)
MCH: 26.4 pg (ref 26.0–34.0)
MCH: 27.1 pg (ref 26.0–34.0)
MCH: 27.4 pg (ref 26.0–34.0)
MCH: 27.5 pg (ref 26.0–34.0)
MCH: 28.3 pg (ref 26.0–34.0)
MCH: 28.3 pg (ref 26.0–34.0)
MCH: 28.7 pg (ref 26.0–34.0)
MCH: 30.5 pg (ref 26.0–34.0)
MCH: 30.5 pg (ref 26.0–34.0)
MCHC: 30.1 g/dL (ref 30.0–36.0)
MCHC: 30.1 g/dL (ref 30.0–36.0)
MCHC: 30.4 g/dL (ref 30.0–36.0)
MCHC: 30.5 g/dL (ref 30.0–36.0)
MCHC: 30.5 g/dL (ref 30.0–36.0)
MCHC: 30.6 g/dL (ref 30.0–36.0)
MCHC: 32 g/dL (ref 30.0–36.0)
MCHC: 32.4 g/dL (ref 30.0–36.0)
MCHC: 32.8 g/dL (ref 30.0–36.0)
MCHC: 33.9 g/dL (ref 30.0–36.0)
MCV: 88.3 fL (ref 78.0–100.0)
MCV: 88.7 fL (ref 78.0–100.0)
MCV: 89.2 fL (ref 78.0–100.0)
MCV: 89.4 fL (ref 78.0–100.0)
MCV: 91.7 fL (ref 78.0–100.0)
MCV: 85.3 fL (ref 78.0–100.0)
MCV: 86.2 fL (ref 78.0–100.0)
MCV: 87.6 fL (ref 78.0–100.0)
MCV: 90 fL (ref 78.0–100.0)
MCV: 91.2 fL (ref 78.0–100.0)
Platelets: 355 10*3/uL (ref 150–400)
Platelets: 374 10*3/uL (ref 150–400)
Platelets: 378 10*3/uL (ref 150–400)
Platelets: 398 10*3/uL (ref 150–400)
Platelets: 119 10*3/uL — ABNORMAL LOW (ref 150–400)
Platelets: 131 10*3/uL — ABNORMAL LOW (ref 150–400)
Platelets: 151 10*3/uL (ref 150–400)
Platelets: 167 10*3/uL (ref 150–400)
Platelets: 195 10*3/uL (ref 150–400)
Platelets: 232 10*3/uL (ref 150–400)
Platelets: 238 10*3/uL (ref 150–400)
RBC: 2.69 MIL/uL — ABNORMAL LOW (ref 4.22–5.81)
RBC: 2.74 MIL/uL — ABNORMAL LOW (ref 4.22–5.81)
RBC: 2.91 MIL/uL — ABNORMAL LOW (ref 4.22–5.81)
RBC: 2.93 MIL/uL — ABNORMAL LOW (ref 4.22–5.81)
RBC: 2.99 MIL/uL — ABNORMAL LOW (ref 4.22–5.81)
RBC: 3 MIL/uL — ABNORMAL LOW (ref 4.22–5.81)
RBC: 3.06 MIL/uL — ABNORMAL LOW (ref 4.22–5.81)
RBC: 3.54 MIL/uL — ABNORMAL LOW (ref 4.22–5.81)
RDW: 17.1 % — ABNORMAL HIGH (ref 11.5–15.5)
RDW: 17.6 % — ABNORMAL HIGH (ref 11.5–15.5)
RDW: 17.9 % — ABNORMAL HIGH (ref 11.5–15.5)
RDW: 18 % — ABNORMAL HIGH (ref 11.5–15.5)
RDW: 13.5 % (ref 11.5–15.5)
RDW: 14.8 % (ref 11.5–15.5)
RDW: 16.9 % — ABNORMAL HIGH (ref 11.5–15.5)
RDW: 17.2 % — ABNORMAL HIGH (ref 11.5–15.5)
RDW: 17.6 % — ABNORMAL HIGH (ref 11.5–15.5)
RDW: 17.8 % — ABNORMAL HIGH (ref 11.5–15.5)
WBC: 10.1 10*3/uL (ref 4.0–10.5)
WBC: 13.4 10*3/uL — ABNORMAL HIGH (ref 4.0–10.5)
WBC: 15 10*3/uL — ABNORMAL HIGH (ref 4.0–10.5)
WBC: 16.2 10*3/uL — ABNORMAL HIGH (ref 4.0–10.5)
WBC: 9.5 10*3/uL (ref 4.0–10.5)
WBC: 11 10*3/uL — ABNORMAL HIGH (ref 4.0–10.5)
WBC: 11.7 10*3/uL — ABNORMAL HIGH (ref 4.0–10.5)
WBC: 11.7 10*3/uL — ABNORMAL HIGH (ref 4.0–10.5)
WBC: 12.9 10*3/uL — ABNORMAL HIGH (ref 4.0–10.5)
WBC: 14.2 10*3/uL — ABNORMAL HIGH (ref 4.0–10.5)
WBC: 17.1 10*3/uL — ABNORMAL HIGH (ref 4.0–10.5)
WBC: 8.1 10*3/uL (ref 4.0–10.5)
WBC: 8.6 10*3/uL (ref 4.0–10.5)

## 2010-04-25 LAB — BASIC METABOLIC PANEL
BUN: 17 mg/dL (ref 6–23)
BUN: 19 mg/dL (ref 6–23)
BUN: 22 mg/dL (ref 6–23)
BUN: 27 mg/dL — ABNORMAL HIGH (ref 6–23)
BUN: 28 mg/dL — ABNORMAL HIGH (ref 6–23)
BUN: 28 mg/dL — ABNORMAL HIGH (ref 6–23)
BUN: 14 mg/dL (ref 6–23)
BUN: 15 mg/dL (ref 6–23)
BUN: 16 mg/dL (ref 6–23)
BUN: 17 mg/dL (ref 6–23)
CO2: 30 mEq/L (ref 19–32)
CO2: 31 mEq/L (ref 19–32)
CO2: 33 mEq/L — ABNORMAL HIGH (ref 19–32)
CO2: 34 mEq/L — ABNORMAL HIGH (ref 19–32)
CO2: 34 mEq/L — ABNORMAL HIGH (ref 19–32)
CO2: 29 mEq/L (ref 19–32)
Calcium: 8 mg/dL — ABNORMAL LOW (ref 8.4–10.5)
Calcium: 8.2 mg/dL — ABNORMAL LOW (ref 8.4–10.5)
Calcium: 8.2 mg/dL — ABNORMAL LOW (ref 8.4–10.5)
Calcium: 8.2 mg/dL — ABNORMAL LOW (ref 8.4–10.5)
Calcium: 8.3 mg/dL — ABNORMAL LOW (ref 8.4–10.5)
Calcium: 8.4 mg/dL (ref 8.4–10.5)
Calcium: 8.5 mg/dL (ref 8.4–10.5)
Calcium: 8.6 mg/dL (ref 8.4–10.5)
Calcium: 7.1 mg/dL — ABNORMAL LOW (ref 8.4–10.5)
Calcium: 7.3 mg/dL — ABNORMAL LOW (ref 8.4–10.5)
Calcium: 7.4 mg/dL — ABNORMAL LOW (ref 8.4–10.5)
Calcium: 7.8 mg/dL — ABNORMAL LOW (ref 8.4–10.5)
Calcium: 8 mg/dL — ABNORMAL LOW (ref 8.4–10.5)
Chloride: 102 mEq/L (ref 96–112)
Chloride: 107 mEq/L (ref 96–112)
Chloride: 98 mEq/L (ref 96–112)
Chloride: 103 mEq/L (ref 96–112)
Creatinine, Ser: 0.91 mg/dL (ref 0.4–1.5)
Creatinine, Ser: 0.95 mg/dL (ref 0.4–1.5)
Creatinine, Ser: 1 mg/dL (ref 0.4–1.5)
Creatinine, Ser: 1.01 mg/dL (ref 0.4–1.5)
Creatinine, Ser: 1.01 mg/dL (ref 0.4–1.5)
Creatinine, Ser: 1.02 mg/dL (ref 0.4–1.5)
Creatinine, Ser: 1.05 mg/dL (ref 0.4–1.5)
Creatinine, Ser: 0.81 mg/dL (ref 0.4–1.5)
Creatinine, Ser: 0.85 mg/dL (ref 0.4–1.5)
Creatinine, Ser: 0.94 mg/dL (ref 0.4–1.5)
Creatinine, Ser: 0.95 mg/dL (ref 0.4–1.5)
Creatinine, Ser: 1.27 mg/dL (ref 0.4–1.5)
GFR calc Af Amer: >60 mL/min (ref >60)
GFR calc Af Amer: >60 mL/min (ref >60)
GFR calc Af Amer: >60 mL/min (ref >60)
GFR calc Af Amer: >60 mL/min (ref >60)
GFR calc Af Amer: >60 mL/min (ref >60)
GFR calc Af Amer: >60 mL/min (ref >60)
GFR calc non Af Amer: >60 mL/min (ref >60)
GFR calc non Af Amer: >60 mL/min (ref >60)
GFR calc non Af Amer: >60 mL/min (ref >60)
GFR calc non Af Amer: >60 mL/min (ref >60)
GFR calc non Af Amer: >60 mL/min (ref >60)
GFR calc non Af Amer: >60 mL/min (ref >60)
GFR calc non Af Amer: >60 mL/min (ref >60)
GFR calc non Af Amer: >60 mL/min (ref >60)
GFR calc non Af Amer: >60 mL/min (ref >60)
GFR calc non Af Amer: 54 mL/min — ABNORMAL LOW (ref >60)
GFR calc non Af Amer: 55 mL/min — ABNORMAL LOW (ref >60)
GFR calc non Af Amer: >60 mL/min (ref >60)
GFR calc non Af Amer: >60 mL/min (ref >60)
GFR calc non Af Amer: >60 mL/min (ref >60)
GFR calc non Af Amer: >60 mL/min (ref >60)
Glucose, Bld: 102 mg/dL — ABNORMAL HIGH (ref 70–99)
Glucose, Bld: 108 mg/dL — ABNORMAL HIGH (ref 70–99)
Glucose, Bld: 116 mg/dL — ABNORMAL HIGH (ref 70–99)
Glucose, Bld: 119 mg/dL — ABNORMAL HIGH (ref 70–99)
Glucose, Bld: 126 mg/dL — ABNORMAL HIGH (ref 70–99)
Glucose, Bld: 151 mg/dL — ABNORMAL HIGH (ref 70–99)
Glucose, Bld: 176 mg/dL — ABNORMAL HIGH (ref 70–99)
Glucose, Bld: 81 mg/dL (ref 70–99)
Glucose, Bld: 94 mg/dL (ref 70–99)
Glucose, Bld: 104 mg/dL — ABNORMAL HIGH (ref 70–99)
Glucose, Bld: 112 mg/dL — ABNORMAL HIGH (ref 70–99)
Glucose, Bld: 144 mg/dL — ABNORMAL HIGH (ref 70–99)
Potassium: 3.9 mEq/L (ref 3.5–5.1)
Potassium: 4 mEq/L (ref 3.5–5.1)
Potassium: 5.1 mEq/L (ref 3.5–5.1)
Potassium: 3.3 mEq/L — ABNORMAL LOW (ref 3.5–5.1)
Potassium: 3.4 mEq/L — ABNORMAL LOW (ref 3.5–5.1)
Potassium: 4 mEq/L (ref 3.5–5.1)
Potassium: 4.6 mEq/L (ref 3.5–5.1)
Sodium: 134 mEq/L — ABNORMAL LOW (ref 135–145)
Sodium: 137 mEq/L (ref 135–145)
Sodium: 137 mEq/L (ref 135–145)
Sodium: 140 mEq/L (ref 135–145)
Sodium: 140 mEq/L (ref 135–145)
Sodium: 141 mEq/L (ref 135–145)
Sodium: 142 mEq/L (ref 135–145)
Sodium: 135 mEq/L (ref 135–145)
Sodium: 138 mEq/L (ref 135–145)
Sodium: 139 mEq/L (ref 135–145)

## 2010-04-25 LAB — POCT I-STAT 3, ART BLOOD GAS (G3+)
Acid-Base Excess: 4 mmol/L — ABNORMAL HIGH (ref 0.0–2.0)
Acid-Base Excess: 5 mmol/L — ABNORMAL HIGH (ref 0.0–2.0)
Acid-Base Excess: 2 mmol/L (ref 0.0–2.0)
Acid-base deficit: 3 mmol/L — ABNORMAL HIGH (ref 0.0–2.0)
Bicarbonate: 28.3 mEq/L — ABNORMAL HIGH (ref 20.0–24.0)
Bicarbonate: 32.9 mEq/L — ABNORMAL HIGH (ref 20.0–24.0)
Bicarbonate: 34.2 mEq/L — ABNORMAL HIGH (ref 20.0–24.0)
Bicarbonate: 20.2 mEq/L (ref 20.0–24.0)
Bicarbonate: 23.3 mEq/L (ref 20.0–24.0)
Bicarbonate: 26.5 mEq/L — ABNORMAL HIGH (ref 20.0–24.0)
O2 Saturation: 90 %
O2 Saturation: 90 %
O2 Saturation: 92 %
O2 Saturation: 79 %
O2 Saturation: 87 %
O2 Saturation: 91 %
O2 Saturation: 98 %
Patient temperature: 98.3
Patient temperature: 98.6
Patient temperature: 100.4
Patient temperature: 101.5
Patient temperature: 97.33
Patient temperature: 98.6
TCO2: 30 mmol/L (ref 0–100)
TCO2: 35 mmol/L (ref 0–100)
TCO2: 36 mmol/L (ref 0–100)
TCO2: 25 mmol/L (ref 0–100)
pCO2 arterial: 41.1 mmHg (ref 35.0–45.0)
pCO2 arterial: 43.5 mmHg (ref 35.0–45.0)
pCO2 arterial: 30.2 mmHg — ABNORMAL LOW (ref 35.0–45.0)
pCO2 arterial: 30.3 mmHg — ABNORMAL LOW (ref 35.0–45.0)
pCO2 arterial: 44.4 mmHg (ref 35.0–45.0)
pCO2 arterial: 45.3 mmHg — ABNORMAL HIGH (ref 35.0–45.0)
pH, Arterial: 7.155 — CL (ref 7.350–7.450)
pH, Arterial: 7.19 — CL (ref 7.350–7.450)
pH, Arterial: 7.527 — ABNORMAL HIGH (ref 7.350–7.450)
pH, Arterial: 7.289 — ABNORMAL LOW (ref 7.350–7.450)
pH, Arterial: 7.306 — ABNORMAL LOW (ref 7.350–7.450)
pH, Arterial: 7.339 — ABNORMAL LOW (ref 7.350–7.450)
pH, Arterial: 7.433 (ref 7.350–7.450)
pO2, Arterial: 52 mmHg — ABNORMAL LOW (ref 80.0–100.0)
pO2, Arterial: 63 mmHg — ABNORMAL LOW (ref 80.0–100.0)
pO2, Arterial: 111 mmHg — ABNORMAL HIGH (ref 80.0–100.0)
pO2, Arterial: 54 mmHg — ABNORMAL LOW (ref 80.0–100.0)
pO2, Arterial: 57 mmHg — ABNORMAL LOW (ref 80.0–100.0)

## 2010-04-25 LAB — GLUCOSE, CAPILLARY
Glucose-Capillary: 151 mg/dL — ABNORMAL HIGH (ref 70–99)
Glucose-Capillary: 102 mg/dL — ABNORMAL HIGH (ref 70–99)
Glucose-Capillary: 104 mg/dL — ABNORMAL HIGH (ref 70–99)
Glucose-Capillary: 110 mg/dL — ABNORMAL HIGH (ref 70–99)
Glucose-Capillary: 114 mg/dL — ABNORMAL HIGH (ref 70–99)
Glucose-Capillary: 118 mg/dL — ABNORMAL HIGH (ref 70–99)
Glucose-Capillary: 118 mg/dL — ABNORMAL HIGH (ref 70–99)
Glucose-Capillary: 123 mg/dL — ABNORMAL HIGH (ref 70–99)
Glucose-Capillary: 126 mg/dL — ABNORMAL HIGH (ref 70–99)
Glucose-Capillary: 126 mg/dL — ABNORMAL HIGH (ref 70–99)
Glucose-Capillary: 130 mg/dL — ABNORMAL HIGH (ref 70–99)
Glucose-Capillary: 132 mg/dL — ABNORMAL HIGH (ref 70–99)
Glucose-Capillary: 132 mg/dL — ABNORMAL HIGH (ref 70–99)
Glucose-Capillary: 133 mg/dL — ABNORMAL HIGH (ref 70–99)
Glucose-Capillary: 133 mg/dL — ABNORMAL HIGH (ref 70–99)
Glucose-Capillary: 134 mg/dL — ABNORMAL HIGH (ref 70–99)
Glucose-Capillary: 136 mg/dL — ABNORMAL HIGH (ref 70–99)
Glucose-Capillary: 137 mg/dL — ABNORMAL HIGH (ref 70–99)
Glucose-Capillary: 137 mg/dL — ABNORMAL HIGH (ref 70–99)
Glucose-Capillary: 138 mg/dL — ABNORMAL HIGH (ref 70–99)
Glucose-Capillary: 143 mg/dL — ABNORMAL HIGH (ref 70–99)
Glucose-Capillary: 146 mg/dL — ABNORMAL HIGH (ref 70–99)
Glucose-Capillary: 147 mg/dL — ABNORMAL HIGH (ref 70–99)
Glucose-Capillary: 152 mg/dL — ABNORMAL HIGH (ref 70–99)
Glucose-Capillary: 153 mg/dL — ABNORMAL HIGH (ref 70–99)
Glucose-Capillary: 156 mg/dL — ABNORMAL HIGH (ref 70–99)
Glucose-Capillary: 157 mg/dL — ABNORMAL HIGH (ref 70–99)
Glucose-Capillary: 157 mg/dL — ABNORMAL HIGH (ref 70–99)
Glucose-Capillary: 158 mg/dL — ABNORMAL HIGH (ref 70–99)
Glucose-Capillary: 164 mg/dL — ABNORMAL HIGH (ref 70–99)
Glucose-Capillary: 165 mg/dL — ABNORMAL HIGH (ref 70–99)
Glucose-Capillary: 173 mg/dL — ABNORMAL HIGH (ref 70–99)
Glucose-Capillary: 177 mg/dL — ABNORMAL HIGH (ref 70–99)
Glucose-Capillary: 179 mg/dL — ABNORMAL HIGH (ref 70–99)
Glucose-Capillary: 99 mg/dL (ref 70–99)
Glucose-Capillary: 100 mg/dL — ABNORMAL HIGH (ref 70–99)
Glucose-Capillary: 100 mg/dL — ABNORMAL HIGH (ref 70–99)
Glucose-Capillary: 100 mg/dL — ABNORMAL HIGH (ref 70–99)
Glucose-Capillary: 100 mg/dL — ABNORMAL HIGH (ref 70–99)
Glucose-Capillary: 102 mg/dL — ABNORMAL HIGH (ref 70–99)
Glucose-Capillary: 102 mg/dL — ABNORMAL HIGH (ref 70–99)
Glucose-Capillary: 109 mg/dL — ABNORMAL HIGH (ref 70–99)
Glucose-Capillary: 116 mg/dL — ABNORMAL HIGH (ref 70–99)
Glucose-Capillary: 117 mg/dL — ABNORMAL HIGH (ref 70–99)
Glucose-Capillary: 117 mg/dL — ABNORMAL HIGH (ref 70–99)
Glucose-Capillary: 118 mg/dL — ABNORMAL HIGH (ref 70–99)
Glucose-Capillary: 119 mg/dL — ABNORMAL HIGH (ref 70–99)
Glucose-Capillary: 122 mg/dL — ABNORMAL HIGH (ref 70–99)
Glucose-Capillary: 123 mg/dL — ABNORMAL HIGH (ref 70–99)
Glucose-Capillary: 130 mg/dL — ABNORMAL HIGH (ref 70–99)
Glucose-Capillary: 135 mg/dL — ABNORMAL HIGH (ref 70–99)
Glucose-Capillary: 136 mg/dL — ABNORMAL HIGH (ref 70–99)
Glucose-Capillary: 154 mg/dL — ABNORMAL HIGH (ref 70–99)
Glucose-Capillary: 67 mg/dL — ABNORMAL LOW (ref 70–99)
Glucose-Capillary: 94 mg/dL (ref 70–99)

## 2010-04-25 LAB — PROTIME-INR
INR: 1.92 — ABNORMAL HIGH (ref 0.00–1.49)
INR: 1.82 — ABNORMAL HIGH (ref 0.00–1.49)
INR: 1.95 — ABNORMAL HIGH (ref 0.00–1.49)
INR: 1.95 — ABNORMAL HIGH (ref 0.00–1.49)
INR: 2.02 — ABNORMAL HIGH (ref 0.00–1.49)
INR: 2.19 — ABNORMAL HIGH (ref 0.00–1.49)
INR: 2.27 — ABNORMAL HIGH (ref 0.00–1.49)
INR: 2.91 — ABNORMAL HIGH (ref 0.00–1.49)
INR: 3.6 — ABNORMAL HIGH (ref 0.00–1.49)
INR: 4.04 — ABNORMAL HIGH (ref 0.00–1.49)
INR: 1.34 (ref 0.00–1.49)
INR: 1.38 (ref 0.00–1.49)
INR: 1.47 (ref 0.00–1.49)
INR: 1.57 — ABNORMAL HIGH (ref 0.00–1.49)
INR: 2.45 — ABNORMAL HIGH (ref 0.00–1.49)
INR: 3.61 — ABNORMAL HIGH (ref 0.00–1.49)
Prothrombin Time: 22.1 seconds — ABNORMAL HIGH (ref 11.6–15.2)
Prothrombin Time: 22.2 seconds — ABNORMAL HIGH (ref 11.6–15.2)
Prothrombin Time: 25.2 seconds — ABNORMAL HIGH (ref 11.6–15.2)
Prothrombin Time: 35 seconds — ABNORMAL HIGH (ref 11.6–15.2)
Prothrombin Time: 35.9 seconds — ABNORMAL HIGH (ref 11.6–15.2)
Prothrombin Time: 36.9 seconds — ABNORMAL HIGH (ref 11.6–15.2)
Prothrombin Time: 42.7 seconds — ABNORMAL HIGH (ref 11.6–15.2)
Prothrombin Time: 14.5 seconds (ref 11.6–15.2)
Prothrombin Time: 17.2 seconds — ABNORMAL HIGH (ref 11.6–15.2)
Prothrombin Time: 18 seconds — ABNORMAL HIGH (ref 11.6–15.2)
Prothrombin Time: 19 seconds — ABNORMAL HIGH (ref 11.6–15.2)
Prothrombin Time: 26.7 seconds — ABNORMAL HIGH (ref 11.6–15.2)
Prothrombin Time: 35.8 seconds — ABNORMAL HIGH (ref 11.6–15.2)
Prothrombin Time: 36 seconds — ABNORMAL HIGH (ref 11.6–15.2)

## 2010-04-25 LAB — BLOOD GAS, ARTERIAL
Acid-base deficit: 0.7 mmol/L (ref 0.0–2.0)
Bicarbonate: 23.7 mEq/L (ref 20.0–24.0)
Bicarbonate: 24.1 mEq/L — ABNORMAL HIGH (ref 20.0–24.0)
FIO2: 0.21 %
FIO2: 0.4 %
O2 Saturation: 95.1 %
O2 Saturation: 98.5 %
PEEP: 5 cmH2O
Patient temperature: 98.6
Patient temperature: 98.7
TCO2: 24.9 mmol/L (ref 0–100)
TCO2: 25.2 mmol/L (ref 0–100)
pH, Arterial: 7.451 — ABNORMAL HIGH (ref 7.350–7.450)

## 2010-04-25 LAB — URINALYSIS, ROUTINE W REFLEX MICROSCOPIC
Hgb urine dipstick: NEGATIVE
Protein, ur: NEGATIVE mg/dL
Urobilinogen, UA: 0.2 mg/dL (ref 0.0–1.0)

## 2010-04-25 LAB — TYPE AND SCREEN

## 2010-04-25 LAB — PREPARE FRESH FROZEN PLASMA

## 2010-04-25 LAB — CK TOTAL AND CKMB (NOT AT ARMC)
CK, MB: 3.7 ng/mL (ref 0.3–4.0)
CK, MB: 4 ng/mL (ref 0.3–4.0)
Relative Index: 0.6 (ref 0.0–2.5)
Total CK: 597 U/L — ABNORMAL HIGH (ref 7–232)

## 2010-04-25 LAB — ABO/RH: ABO/RH(D): O POS

## 2010-04-25 LAB — PREPARE PLATELETS

## 2010-04-25 LAB — MAGNESIUM: Magnesium: 1.7 mg/dL (ref 1.5–2.5)

## 2010-04-25 LAB — CULTURE, BAL-QUANTITATIVE W GRAM STAIN

## 2010-04-25 LAB — URINE CULTURE
Colony Count: NO GROWTH
Colony Count: NO GROWTH
Culture  Setup Time: 201109252139
Culture  Setup Time: 201109131051
Culture: NO GROWTH

## 2010-04-25 LAB — POCT I-STAT 7, (LYTES, BLD GAS, ICA,H+H)
Acid-base deficit: 3 mmol/L — ABNORMAL HIGH (ref 0.0–2.0)
Bicarbonate: 23.1 mEq/L (ref 20.0–24.0)
HCT: 19 % — ABNORMAL LOW (ref 39.0–52.0)
O2 Saturation: 100 %
Sodium: 135 mEq/L (ref 135–145)

## 2010-04-25 LAB — NA AND K (SODIUM & POTASSIUM), RAND UR
Potassium Urine: 82 mEq/L
Sodium, Ur: <10 mEq/L

## 2010-04-25 LAB — CULTURE, RESPIRATORY W GRAM STAIN

## 2010-04-25 LAB — CARDIAC PANEL(CRET KIN+CKTOT+MB+TROPI)
Total CK: 931 U/L — ABNORMAL HIGH (ref 7–232)
Troponin I: 0.12 ng/mL — ABNORMAL HIGH (ref 0.00–0.06)
Troponin I: 0.06 ng/mL (ref 0.00–0.06)
Troponin I: 0.11 ng/mL — ABNORMAL HIGH (ref 0.00–0.06)

## 2010-04-25 LAB — IRON AND TIBC
Iron: <10 ug/dL — ABNORMAL LOW (ref 42–135)
UIBC: 128 ug/dL

## 2010-04-25 LAB — CULTURE, BLOOD (ROUTINE X 2)
Culture: NO GROWTH
Culture: NO GROWTH

## 2010-04-25 LAB — FERRITIN: Ferritin: 333 ng/mL — ABNORMAL HIGH (ref 22–322)

## 2010-04-25 LAB — TROPONIN I: Troponin I: 0.07 ng/mL — ABNORMAL HIGH (ref 0.00–0.06)

## 2010-04-25 LAB — BRAIN NATRIURETIC PEPTIDE: Pro B Natriuretic peptide (BNP): 378 pg/mL — ABNORMAL HIGH (ref 0.0–100.0)

## 2010-04-26 DIAGNOSIS — I4891 Unspecified atrial fibrillation: Secondary | ICD-10-CM

## 2010-04-26 DIAGNOSIS — I428 Other cardiomyopathies: Secondary | ICD-10-CM

## 2010-04-26 DIAGNOSIS — I359 Nonrheumatic aortic valve disorder, unspecified: Secondary | ICD-10-CM

## 2010-04-26 DIAGNOSIS — Z5181 Encounter for therapeutic drug level monitoring: Secondary | ICD-10-CM

## 2010-04-26 DIAGNOSIS — Z902 Acquired absence of lung [part of]: Secondary | ICD-10-CM

## 2010-04-26 DIAGNOSIS — J449 Chronic obstructive pulmonary disease, unspecified: Secondary | ICD-10-CM

## 2010-04-26 DIAGNOSIS — Z7901 Long term (current) use of anticoagulants: Secondary | ICD-10-CM

## 2010-04-26 DIAGNOSIS — I10 Essential (primary) hypertension: Secondary | ICD-10-CM

## 2010-04-26 LAB — SURGICAL PCR SCREEN
MRSA, PCR: NEGATIVE
Staphylococcus aureus: NEGATIVE

## 2010-04-26 LAB — COMPREHENSIVE METABOLIC PANEL
Albumin: 4.2 g/dL (ref 3.5–5.2)
BUN: 8 mg/dL (ref 6–23)
Calcium: 9.5 mg/dL (ref 8.4–10.5)
Glucose, Bld: 99 mg/dL (ref 70–99)
Total Protein: 7.4 g/dL (ref 6.0–8.3)

## 2010-04-26 LAB — CBC
HCT: 42.2 % (ref 39.0–52.0)
MCHC: 34.1 g/dL (ref 30.0–36.0)
MCV: 90.4 fL (ref 78.0–100.0)
Platelets: 175 10*3/uL (ref 150–400)
RDW: 13.4 % (ref 11.5–15.5)
WBC: 9.7 10*3/uL (ref 4.0–10.5)

## 2010-04-26 LAB — APTT: aPTT: 35 seconds (ref 24–37)

## 2010-04-26 LAB — PROTIME-INR
INR: 1.06 (ref 0.00–1.49)
Prothrombin Time: 14 seconds (ref 11.6–15.2)

## 2010-04-26 LAB — CULTURE, RESPIRATORY W GRAM STAIN: Gram Stain: NONE SEEN

## 2010-04-26 LAB — AFB CULTURE WITH SMEAR (NOT AT ARMC)

## 2010-04-26 LAB — FUNGUS CULTURE W SMEAR: Fungal Smear: NONE SEEN

## 2010-04-30 LAB — COMPREHENSIVE METABOLIC PANEL
ALT: 30 U/L (ref 0–53)
AST: 26 U/L (ref 0–37)
Alkaline Phosphatase: 54 U/L (ref 39–117)
CO2: 26 mEq/L (ref 19–32)
Calcium: 9.2 mg/dL (ref 8.4–10.5)
GFR calc Af Amer: >60 mL/min (ref >60)
Potassium: 4.5 mEq/L (ref 3.5–5.1)
Sodium: 136 mEq/L (ref 135–145)
Total Protein: 7.2 g/dL (ref 6.0–8.3)

## 2010-04-30 LAB — DIFFERENTIAL
Eosinophils Absolute: 0 10*3/uL (ref 0.0–0.7)
Lymphs Abs: 0.8 10*3/uL (ref 0.7–4.0)
Neutro Abs: 9.5 10*3/uL — ABNORMAL HIGH (ref 1.7–7.7)
Neutrophils Relative %: 87 % — ABNORMAL HIGH (ref 43–77)

## 2010-04-30 LAB — CULTURE, BLOOD (ROUTINE X 2): Culture: NO GROWTH

## 2010-04-30 LAB — CBC
Hemoglobin: 11.3 g/dL — ABNORMAL LOW (ref 13.0–17.0)
MCV: 88.3 fL (ref 78.0–100.0)
Platelets: 315 10*3/uL (ref 150–400)
RBC: 4.09 MIL/uL — ABNORMAL LOW (ref 4.22–5.81)
WBC: 10.9 10*3/uL — ABNORMAL HIGH (ref 4.0–10.5)

## 2010-04-30 LAB — GLUCOSE, CAPILLARY: Glucose-Capillary: 107 mg/dL — ABNORMAL HIGH (ref 70–99)

## 2010-04-30 LAB — PROLACTIN: Prolactin: 4.9 ng/mL (ref 2.1–17.1)

## 2010-04-30 LAB — PROTIME-INR: Prothrombin Time: 25.9 seconds — ABNORMAL HIGH (ref 11.6–15.2)

## 2010-04-30 NOTE — Assessment & Plan Note (Signed)
Summary: eph   Visit Type:  Follow-up Referring Provider:  Dr. [name] Primary Provider:  Norins  CC:  shortness of breath.  History of Present Illness: 77 year-old male with history of aortic insufficiency who underwent [facility] Jude mechanical AVR in the 1990's who underwent right lower lobectomy recently complicated by postoperative respiratory failure. He developed atrial fibrillation postoperatively and failed DCCV.    The patient has been in and out of the hospital with problems related to respiratory failure, CHF, and atrial fib over the last several months. He has failed DCCV on two occasions. Most recently he was admitted for a Tikosyn load and he converted to sinus rhythm within 48 hours. He presents today for hospital followup evaluation.  He still complains of shortness of breath, but overall is improved. This is the best he has felt since his lung surgery. He reports 2 episodes of symptomatic AFib since he started Tikosyn - each limited to about 2 hours.     Current Medications (verified): 1)  Adult Aspirin Ec Low Strength 81 Mg  Tbec (Aspirin) .... Take 1 Tablet By Mouth Once A Day 2)  Bisoprolol Fumarate 5 Mg Tabs (Bisoprolol Fumarate) .... Take One Tablet By Mouth Two Times A Day 3)  Warfarin Sodium 2.5 Mg Tabs (Warfarin Sodium) .... Use As Directed By Anticoagualtion Clinic 4)  Ferrous Sulfate 325 (65 Fe) Mg Tabs (Ferrous Sulfate) .... Take One  By Mouth Two Times A Day 5)  Potassium Chloride Crys Cr 20 Meq Cr-Tabs (Potassium Chloride Crys Cr) .... Take One Tablet By Mouth Daily 6)  Pepcid 20 Mg Tabs (Famotidine) .... Take One By Mouth Once Daily As Needed 7)  Furosemide 40 Mg Tabs (Furosemide) .... 2 Tablets Once Daily 8)  Ventolin Hfa 108 (90 Base) Mcg/act Aers (Albuterol Sulfate) .... 2 Puffs Q4h As Needed Sob 9)  Oxygen .... Wear 3.5l/min To 4l/min Continuously 10)  Spiriva Handihaler 18 Mcg Caps (Tiotropium Bromide Monohydrate) .... Inhale Contents of 1 Capsule Once  A Day 11)  Symbicort 80-4.5 Mcg/act Aero (Budesonide-Formoterol Fumarate) .... Inhale 2 Puffs Two Times A Day 12)  Mucinex 600 Mg Xr12h-Tab (Guaifenesin) .... Take 2 Tablets Every 12 Hours 13)  Tylenol Extra Strength 500 Mg Tabs (Acetaminophen) .... Per Bottle As Needed 14)  Prednisone 20 Mg Tabs (Prednisone) .... Take 1 1/2 Tablets Daily 15)  Lisinopril 2.5 Mg Tabs (Lisinopril) .... Take One Tablet By Mouth Daily 16)  Tikosyn 500 Mcg Caps (Dofetilide) .... Take One Capsule By Mouth Twice A Day 17)  Ocean Nasal Spray 0.65 % Soln (Saline) .... Daily As Needed  Allergies: 1)  ! Pcn  Past History:  Past medical history reviewed for relevance to current acute and chronic problems.  Past Medical History: Current Problems:  ATRIAL FIBRILLATION (ICD-427.31) AORTIC INSUFFICIENCY (ICD-424.1) CARDIOMYOPATHY (ICD-425.4) PERIPHERAL EDEMA (ICD-782.3)Chronic lower extremity edema. 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) UNS ADVRS EFF UNS RX MEDICINAL&BIOLOGICAL SBSTNC (ICD-995.20) UMBILICAL HERNIA (ICD-553.1) SEBACEOUS CYST, NECK (ICD-706.2) UNSPECIFIED LABYRINTHITIS (ICD-386.30) DYSPEPSIA, HX OF (ICD-V12.79) SPECIAL SCREENING MALIGNANT NEOPLASM OF PROSTATE (ICD-V76.44) HERPES ZOSTER, HX OF 1996 (ICD-V13.8) BRONCHOGENIC CARCINOMA S/P LOBECTOMY 2011 AMIODARONE LUNG TOXICITY  Review of Systems       Negative except as per HPI   Vital Signs:  Patient profile:   77 year old male Height:      72 inches Weight:      177.31 pounds  BMI:     24.13 Pulse rate:   76 / minute Pulse rhythm:   regular Resp:     20 per minute BP sitting:   106 / 58  (right arm) Cuff size:   large  Vitals Entered By: Vikki Ports (April 12, 2010 3:21 PM)  Physical Exam  General:  Pt is alert and  oriented, in no acute distress, wearing O2. HEENT: normal Neck: normal carotid upstrokes without bruits, JVP normal Lungs: CTA CV: RRR with crisp mechanical aortic closure sound Abd: soft, NT, positive BS, no bruit, no organomegaly Ext: no clubbing, cyanosis, or edema. peripheral pulses 2+ and equal Skin: warm and dry without rash    EKG  Procedure date:  04/12/2010  Findings:      Normal sinus rhythm 71 beats per minute with nonspecific ST/T-wave changes  Impression & Recommendations:  Problem # 1:  ATRIAL FIBRILLATION (ICD-427.31) The patient is maintaining sinus rhythm on dofetilide. He is tolerating long-term warfarin. We'll continue his current medical program.   His updated medication list for this problem includes:    Adult Aspirin Ec Low Strength 81 Mg Tbec (Aspirin) .Marland Kitchen... Take 1 tablet by mouth once a day    Bisoprolol Fumarate 5 Mg Tabs (Bisoprolol fumarate) .Marland Kitchen... Take one tablet by mouth two times a day    Warfarin Sodium 2.5 Mg Tabs (Warfarin sodium) ..... Use as directed by anticoagualtion clinic    Tikosyn 500 Mcg Caps (Dofetilide) .Marland Kitchen... Take one capsule by mouth twice a day  Orders: EKG w/ Interpretation (93000)  Problem # 2:  CORONARY ATHEROSCLEROSIS NATIVE CORONARY ARTERY (ICD-414.01) Stable without angina. His updated medication list for this problem includes:    Adult Aspirin Ec Low Strength 81 Mg Tbec (Aspirin) .Marland Kitchen... Take 1 tablet by mouth once a day    Bisoprolol Fumarate 5 Mg Tabs (Bisoprolol fumarate) .Marland Kitchen... Take one tablet by mouth two times a day    Warfarin Sodium 2.5 Mg Tabs (Warfarin sodium) ..... Use as directed by anticoagualtion clinic    Lisinopril 2.5 Mg Tabs (Lisinopril) .Marland Kitchen... Take one tablet by mouth daily  Orders: EKG w/ Interpretation (93000)  Problem # 3:  PROSTHETIC VALVE-MECHANICAL (ICD-V43.3) Recent echoes have shown normal prosthetic aortic valve gradients. Continue anticoagulation with warfarin.  Problem # 4:  DYSLIPIDEMIA  (ICD-272.4) Patient is due for repeat lipids and LFTs. We'll resume statin therapy with Pravachol. His updated medication list for this problem includes:    Pravastatin Sodium 40 Mg Tabs (Pravastatin sodium) .Marland Kitchen... Take one tablet by mouth once daily  Patient Instructions: 1)  Your physician recommends that you schedule a follow-up appointment in: 8 WEEKS 2)  Your physician recommends that you return for a FASTING LIPID, LIVER, BMP and BNP on 04/16/10 (add to Dr Delton Coombes labs--427.31, 424.1)--nothing to eat or drink after midnight  3)  Your physician recommends that you continue on your current medications as directed. Please refer to the Current Medication list given to you today.

## 2010-04-30 NOTE — Medication Information (Addendum)
Summary: Coumadin Clinic  Anticoagulant Therapy  Managed by: Cloyde Reams, RN, BSN Referring MD: [name] PCP: Link Snuffer MD: Johney Frame MD, Fayrene Fearing Indication 1: Aortic Valve Replacement (ICD-V43.3) Indication 2: Aortic Valve Disorder (ICD-424.1) Lab Used: [facility] [place] Site: [address] PT 36.3 INR POC 3.0 INR RANGE 2.5 - 3.5   Health status changes: yes       Details: Having some pain in legs  Bleeding/hemorrhagic complications: no     Any changes in medication regimen? yes       Details: Decr Predinsone to 10mg  daily on 04/24/10, through 05/01/10 d/c. Seeing Dr Delton Coombes on 05/03/10.    Any missed doses?: no       Is patient compliant with meds? yes       Allergies: 1)  ! Pcn  Anticoagulation Management History:      The patient is taking warfarin and comes in today for a routine follow up visit.  Positive risk factors for bleeding include an age of 75 years or older.  The bleeding index is 'intermediate risk'.  Positive CHADS2 values include History of HTN.  Negative CHADS2 values include Age > 75 years old.  The start date was 09/05/1998.  His last INR was 5.9.  Prothrombin time is 36.3.  Anticoagulation responsible provider: Gerrad Welker MD, Fayrene Fearing.  INR POC: 3.0.  Exp: 03/2011.    Anticoagulation Management Assessment/Plan:      The patient's current anticoagulation dose is Warfarin sodium 2.5 mg tabs: Use as directed by Anticoagualtion Clinic.  The target INR is 2.5-3.5.  The next INR is due 05/03/2010.  Anticoagulation instructions were given to home health nurse.  Results were reviewed/authorized by Cloyde Reams, RN, BSN.  He was notified by Cloyde Reams RN.         Prior Anticoagulation Instructions: INR 2.3  Spoke with Rosa with [facility] while in pt's home.  Take 1 1/2 tablets today then resume same dose of 1 tablet every day except 1/2 tablet on Monday and Friday.  Recheck INR in 1 week.   Current Anticoagulation Instructions: INR 3.0  Spoke  with Kathie Rhodes, [facility] RN while at pt's home, advised to have pt continue on same dosage 2.5mg  daily except 1.25mg  on Mondays and Fridays.  Recheck in 10 days. HH discharging pt today.  Made ov in CVRR on 05/03/10.

## 2010-05-03 ENCOUNTER — Other Ambulatory Visit (INDEPENDENT_AMBULATORY_CARE_PROVIDER_SITE_OTHER): Payer: Medicare Other

## 2010-05-03 ENCOUNTER — Ambulatory Visit (INDEPENDENT_AMBULATORY_CARE_PROVIDER_SITE_OTHER): Payer: Medicare Other | Admitting: Emergency Medicine

## 2010-05-03 ENCOUNTER — Other Ambulatory Visit: Payer: Self-pay | Admitting: *Deleted

## 2010-05-03 ENCOUNTER — Ambulatory Visit (INDEPENDENT_AMBULATORY_CARE_PROVIDER_SITE_OTHER)
Admission: RE | Admit: 2010-05-03 | Discharge: 2010-05-03 | Disposition: A | Discharge status: Home / Self Care | Payer: Medicare Other | Source: Ambulatory Visit | Attending: Emergency Medicine | Admitting: Emergency Medicine

## 2010-05-03 ENCOUNTER — Encounter: Payer: Self-pay | Admitting: Emergency Medicine

## 2010-05-03 ENCOUNTER — Ambulatory Visit (INDEPENDENT_AMBULATORY_CARE_PROVIDER_SITE_OTHER): Payer: Medicare Other | Admitting: *Deleted

## 2010-05-03 VITALS — BP 102/70 | HR 73 | Temp 98.0°F | Ht 72.0 in | Wt 183.2 lb

## 2010-05-03 DIAGNOSIS — I10 Essential (primary) hypertension: Secondary | ICD-10-CM

## 2010-05-03 DIAGNOSIS — I359 Nonrheumatic aortic valve disorder, unspecified: Secondary | ICD-10-CM

## 2010-05-03 DIAGNOSIS — J4489 Other specified chronic obstructive pulmonary disease: Secondary | ICD-10-CM

## 2010-05-03 DIAGNOSIS — IMO0001 Reserved for inherently not codable concepts without codable children: Secondary | ICD-10-CM

## 2010-05-03 DIAGNOSIS — Z954 Presence of other heart-valve replacement: Secondary | ICD-10-CM

## 2010-05-03 DIAGNOSIS — Z7901 Long term (current) use of anticoagulants: Secondary | ICD-10-CM

## 2010-05-03 DIAGNOSIS — M791 Myalgia, unspecified site: Secondary | ICD-10-CM | POA: Insufficient documentation

## 2010-05-03 DIAGNOSIS — J449 Chronic obstructive pulmonary disease, unspecified: Secondary | ICD-10-CM

## 2010-05-03 DIAGNOSIS — I4891 Unspecified atrial fibrillation: Secondary | ICD-10-CM

## 2010-05-03 LAB — TESTOSTERONE: Testosterone: 130.23 ng/dL — ABNORMAL LOW (ref 350.00–890.00)

## 2010-05-03 LAB — POCT INR: INR: 2.2

## 2010-05-03 MED ORDER — FUROSEMIDE 40 MG PO TABS: ORAL_TABLET | ORAL | Status: DC

## 2010-05-03 NOTE — Assessment & Plan Note (Signed)
Spiriva + Symbicort O2 as ordered

## 2010-05-03 NOTE — Progress Notes (Signed)
  Subjective:    Patient ID: John [name], male    DOB: [birth year], 77 y.o.   MRN: [id number]  Hip Pain   77 yo complicated pt w hx COPD, NCSLCA s/p resection. Parox A Fib that has been assoc with vol overload and dyspnea. Also w pulm infilgtrates, ? 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 Has been in and out of hosp in Feb 2012 due to exacerbations, RVR, etc. Last d/c was 2/20. Feels that his HR is better controlled on Tikosyn.   Last visit we made plan to wean Pred to off (COP vs Amio tox). He feels that his breathing is stable, has been off the pred x 3 days. He is complaining of severe gluteal pain bilaterally radiating to each thigh, started about 3 weeks ago. Of note he started Pravachol about 3 weeks ago.     Review of Systems   Constitutional:   No  weight loss, slight gain, night sweats,  Fevers, chills, fatigue, lassitude. HEENT:   No headaches,  Difficulty swallowing,  Tooth/dental problems,  Sore throat,                No sneezing, itching, ear ache, nasal congestion, post nasal drip,   CV:  No chest pain,  Orthopnea, PND, swelling in lower extremities, anasarca, dizziness, palpitations  GI  No heartburn, indigestion, abdominal pain, nausea, vomiting, diarrhea, change in bowel habits, loss of appetite  Resp: No shortness of breath with exertion or at rest.  No excess mucus, no productive cough,  No non-productive cough,  No coughing up of blood.  No change in color of mucus.  No wheezing.  No chest wall deformity  Skin: no rash or lesions.  GU: no dysuria, change in color of urine, no urgency or frequency.  No flank pain.  MS:  C/o gluteal pain, thigh pain x 3 weeks as above  Psych:  No change in mood or affect. No depression or anxiety.  No memory loss.     Objective:   Physical Exam Gen: Pleasant, well-nourished, in no distress on O2,  normal affect, in wheelchair  ENT: No lesions,  mouth clear,  oropharynx clear, no postnasal drip  Neck: No JVD, No stridor  Lungs: No use of  accessory muscles, no dullness to percussion, clear without rales, wheeze or rhonchi, decreased R base  Cardiovascular: RRR, heart sounds normal, no murmur or gallops, no peripheral edema  Musculoskeletal: No deformities, no cyanosis or clubbing  Neuro: alert, non focal  Skin: Warm, eccymoses B LE's       Assessment & Plan:

## 2010-05-03 NOTE — Patient Instructions (Signed)
Start taking 2.5mg  daily.  Recheck in 2 weeks.

## 2010-05-03 NOTE — Assessment & Plan Note (Signed)
Appears to be resolved radiographically and clinically - follow exam and CXR

## 2010-05-03 NOTE — Assessment & Plan Note (Signed)
?   Due to initiation Pravachol. He has been on steroids, could have weakness/myopathy due to this.  - will stop pravachol temporarily and follow exaam - check CPK, LDH and testosterone level - OV in 1 month

## 2010-05-03 NOTE — Patient Instructions (Signed)
We will check bloodwork today Stop your Pravachol for now and we will reassess your muscle pain. Continue your Spiriva and Symbicort  Continue your oxygen at all times Tykosyn and Coumadin as directed by Dr Excell Seltzer Follow up with Dr Delton Coombes in 1 month

## 2010-05-09 ENCOUNTER — Telehealth: Payer: Self-pay | Admitting: Emergency Medicine

## 2010-05-09 NOTE — Telephone Encounter (Signed)
Pt notified Dr. Delton Coombes is out of the office. We will forward the msg so he is aware pt calling for lab and cxr results and call him once Dr. Delton Coombes has had a chance to review. Pt verbalized understanding and agreed with this.

## 2010-05-10 NOTE — Telephone Encounter (Signed)
[id number] x1 

## 2010-05-10 NOTE — Telephone Encounter (Signed)
Plesae inform pt that CXR is stable - unchanged from priors.  Testosterone level is low - I would like for him to discuss this with DR Norins, especially if muscle pain continues.  The other labs are not back yet.

## 2010-05-10 NOTE — Telephone Encounter (Signed)
Pt is aware of test results Dr. Delton Coombes has received so far and we will call once his other test are ready and have been reviewed by RB.

## 2010-05-14 ENCOUNTER — Encounter: Payer: Self-pay | Admitting: Internal Medicine

## 2010-05-14 ENCOUNTER — Ambulatory Visit (INDEPENDENT_AMBULATORY_CARE_PROVIDER_SITE_OTHER): Payer: Medicare Other | Admitting: Internal Medicine

## 2010-05-14 VITALS — BP 122/68 | HR 77 | Temp 97.7°F | Wt 184.0 lb

## 2010-05-14 DIAGNOSIS — K429 Umbilical hernia without obstruction or gangrene: Secondary | ICD-10-CM

## 2010-05-15 NOTE — Progress Notes (Signed)
Subjective:    Patient ID: John [name], male    DOB: 1933/12/17, 77 y.o.   MRN: [id number]  HPI Mr. Levario presents for evaluation of enlarging umbilical hernia.  In the interval since his last visit March 2011 he has had a very significant medical history under the care of Drs. Bonney Roussel and Cliffdell. In May he was found on x-ray to have a pulmonary nodule. In June PFTs revealed emphysema and restrictive COPD. In August CT revealed progressive growth of a spiculated lesion in the RLL in a location corresponding to at positive PET scan.. In August he came to [facility] which returned as positive for squamous cell carcinoma. He came to [facility] for RL Lobectomy September 6th. His course was complicated by bleed requiring re-exploration with a prolonged ICU course and ventilatory support. He developed A fib/flutter and had unsuccessful [facility] 9/19. He required reintubation for respiratory failure 9/20. He had a 2 D echo with an EF 40-45%. He was discharged at the end of September. Nov 15th he presented with recurrent A. Fib. Followed by successful [facility] Dec 1st which unfortunately did not hold and he was started on amiodarone. He was admitted Dec 16th for amiodarone toxicity. He was readmitted Feb 3rd for RLL pneumonia vs amiodarone toxicity with a discharge diagnosis of BOOP Feb 8th. He was readmitted Feb 11th with fib/flutter and respiratory distress, discharged Feb 13th. He was readmitted Feb 17th for tikosyn loading. During that stay he had a TEE with an EF 35-40% and severe TR.  At this time he is oxygen dependent, remains limited in his activities and weak. His attitude is good but he is weary. He is concerned about his umbilical hernia which has been getting larger and wants to avoid an acute situation with incarcerated hernia that would require emergent surgery. He does have a good appetite. He denies any chest pain or discomfort.   Past Medical History  Diagnosis Date  . Atrial fibrillation   . Aortic valve  disorders   . Other primary cardiomyopathies   . Edema     CHRONIC LOWER EXTREMITY  . Unspecified essential hypertension   . Other and unspecified hyperlipidemia   . Heart valve replaced by other means   . Other malaise and fatigue   . Coronary atherosclerosis of native coronary artery   . Acute myocardial infarction, subendocardial infarction, subsequent episode of care   . Unspecified adverse effect of unspecified drug, medicinal and biological substance   . Umbilical hernia without mention of obstruction or gangrene   . Sebaceous cyst   . Labyrinthitis, unspecified   . Personal history of other diseases of digestive system   . Special screening for malignant neoplasm of prostate   . BC (bronchogenic carcinoma)     S/P LOBECTOMY 2011   Past Surgical History  Procedure Date  . Aortic valve replacement 1994    [facility] JUDES  . Appendectomy YOUTH  . Abdominal hernia repair   . Lobectomy 2011   Family History  Problem Relation Age of Onset  . Colon cancer Father   . Aneurysm Mother     RUPTURE-NECK   . Coronary artery disease Mother    History   Social History  . Marital Status: Married    Spouse Name: N/A    Number of Children: 1  . Years of Education: N/A   Occupational History  . [employer]     PHOTOGRAPHER   Social History Main Topics  . Smoking status: Former Smoker -- 1.0  packs/day for 40 years    Types: Cigarettes    Quit date: 02/10/2009  . Smokeless tobacco: Not on file  . Alcohol Use: Not on file  . Drug Use: Not on file  . Sexually Active: Not on file   Other Topics Concern  . Not on file   Social History Narrative  . No narrative on file        Review of Systems  Constitutional: Positive for activity change and fatigue. Negative for fever, chills, diaphoresis, appetite change and unexpected weight change.  HENT: Negative for hearing loss, nosebleeds, congestion, neck pain and ear discharge.   Eyes: Negative.   Respiratory: Positive for cough and  shortness of breath. Negative for apnea, choking, chest tightness, wheezing and stridor.   Cardiovascular: Negative for chest pain, palpitations and leg swelling.  Gastrointestinal:       Increased size of umbilical hernia, occasional tenderness with reduction of the hernia  Genitourinary: Negative.   Musculoskeletal: Negative.   Neurological: Negative.   Hematological: Negative.   Psychiatric/Behavioral: Negative.        Objective:   Physical Exam  Constitutional: He is oriented to person, place, and time. He appears well-nourished. No distress.       Chronically ill appearing white male on O2  HENT:  Head: Normocephalic and atraumatic.  Eyes: Conjunctivae and EOM are normal. Pupils are equal, round, and reactive to light.  Neck: Neck supple.  Cardiovascular: Normal rate.   Pulmonary/Chest: Effort normal. He has no wheezes. He has no rales.  Abdominal: Soft. Bowel sounds are normal. He exhibits no mass. There is no rebound and no guarding.       Umbilical hernia the size of a large apricot, soft, reducible and not tender.   Musculoskeletal: Normal range of motion. He exhibits no edema.  Neurological: He is alert and oriented to person, place, and time. He has normal reflexes. No cranial nerve deficit.  Skin: Skin is warm and dry.  Psychiatric: He has a normal mood and affect. His behavior is normal. Thought content normal.          Assessment & Plan:  1. Pulmonary - oxygen dependent due to COPD/restrictive with emphysema and reduced lung volume after lobectomy. He gets short of breath easily. He is followed closely by Dr. Delton Coombes  2.Umbilical Hernia - a growing hernia. He understands the risk of incarceration. He is interested in surgical opinion about repair and his suitability for the same. He is advised that general anesthesia may pose to great a risk but that a repair with local or spinal anesthesia may be doable. He understands the he will need to have cardiology clearance  and pulmonary clearance if any procedure is contemplated.  Plan - consultation with Dr. Carolynne Edouard.  3. CArdiology - seems to be stable at this time on tikosyn. He is followed closely by Dr. Excell Seltzer and associates. He is on coumadin and he understands that if surgery is contemplated he would need to have lovenox bridging, which he has done before.

## 2010-05-16 ENCOUNTER — Ambulatory Visit (INDEPENDENT_AMBULATORY_CARE_PROVIDER_SITE_OTHER): Payer: Medicare Other | Admitting: *Deleted

## 2010-05-16 DIAGNOSIS — I359 Nonrheumatic aortic valve disorder, unspecified: Secondary | ICD-10-CM

## 2010-05-16 DIAGNOSIS — Z954 Presence of other heart-valve replacement: Secondary | ICD-10-CM

## 2010-05-16 DIAGNOSIS — I4891 Unspecified atrial fibrillation: Secondary | ICD-10-CM

## 2010-05-16 LAB — POCT INR: INR: 1.8

## 2010-05-27 ENCOUNTER — Ambulatory Visit (INDEPENDENT_AMBULATORY_CARE_PROVIDER_SITE_OTHER): Payer: Medicare Other | Admitting: *Deleted

## 2010-05-27 DIAGNOSIS — I4891 Unspecified atrial fibrillation: Secondary | ICD-10-CM

## 2010-05-27 DIAGNOSIS — Z954 Presence of other heart-valve replacement: Secondary | ICD-10-CM

## 2010-05-27 DIAGNOSIS — I359 Nonrheumatic aortic valve disorder, unspecified: Secondary | ICD-10-CM

## 2010-05-27 LAB — POCT INR: INR: 2.4

## 2010-05-29 ENCOUNTER — Other Ambulatory Visit: Payer: Self-pay | Admitting: *Deleted

## 2010-05-29 MED ORDER — LISINOPRIL 2.5 MG PO TABS: 2.5000 mg | ORAL_TABLET | Freq: Every day | ORAL | Status: DC

## 2010-06-06 ENCOUNTER — Encounter: Payer: Self-pay | Admitting: Cardiovascular Disease

## 2010-06-06 ENCOUNTER — Ambulatory Visit (INDEPENDENT_AMBULATORY_CARE_PROVIDER_SITE_OTHER): Payer: Medicare Other | Admitting: Cardiovascular Disease

## 2010-06-06 ENCOUNTER — Ambulatory Visit (INDEPENDENT_AMBULATORY_CARE_PROVIDER_SITE_OTHER): Payer: Medicare Other | Admitting: *Deleted

## 2010-06-06 VITALS — BP 128/70 | HR 68 | Resp 20 | Ht 72.0 in | Wt 187.1 lb

## 2010-06-06 DIAGNOSIS — I2510 Atherosclerotic heart disease of native coronary artery without angina pectoris: Secondary | ICD-10-CM

## 2010-06-06 DIAGNOSIS — I4891 Unspecified atrial fibrillation: Secondary | ICD-10-CM

## 2010-06-06 DIAGNOSIS — K429 Umbilical hernia without obstruction or gangrene: Secondary | ICD-10-CM

## 2010-06-06 DIAGNOSIS — Z954 Presence of other heart-valve replacement: Secondary | ICD-10-CM

## 2010-06-06 DIAGNOSIS — I359 Nonrheumatic aortic valve disorder, unspecified: Secondary | ICD-10-CM

## 2010-06-06 MED ORDER — DOFETILIDE 250 MCG PO CAPS: 250.0000 ug | ORAL_CAPSULE | Freq: Two times a day (BID) | ORAL | Status: DC

## 2010-06-06 NOTE — Assessment & Plan Note (Signed)
The patient is stable without angina. We'll continue his current medical program. He is not going to be able to take statin drugs after developing severe myalgias with pravastatin.

## 2010-06-06 NOTE — Assessment & Plan Note (Signed)
I think unless Mr. Trageser is a significant risk of incarceration of his hernia, it may be best to manage him conservatively. I worry about his surgical risk in the setting of significant cardiopulmonary disease. He remains on home oxygen and has had several hospitalizations with congestive heart failure over the last 6 months. He also would be at high risk of recurrent atrial fib postoperatively and he has not tolerated his atrial fib well. If he ultimately require surgery his overall cardiac risk is not prohibitive and I think with careful medical attention he would do okay.

## 2010-06-06 NOTE — Assessment & Plan Note (Signed)
The patient's QT interval is now prolonged on tikosyn 500 mg twice daily. Will reduce to 250 mg twice daily and followup an EKG in one week.

## 2010-06-06 NOTE — Progress Notes (Signed)
HPI:  John [name] presents today for followup evaluation. The patient has paroxysmal atrial fibrillation, and history of aortic insufficiency status post St. Jude aortic valve replacement in the 1990s. He underwent right lower lobectomy last year complicated by postoperative respiratory failure. He developed atrial fib postoperatively and required amiodarone therapy to maintain sinus rhythm. He was cardioverted on 2 occasions. 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 He now is been placed on Tikosyn and has done well on this drug with maintenance of sinus rhythm.  He continues to require home oxygen. His shortness of breath is stable. He's had no further leg swelling. When he was seen here in March he was started on pravastatin but developed severe leg pain and weakness and this had been discontinued.  The patient has an umbilical hernia and was evaluated by Dr. Freida Busman who is considering surgical repair. The patient notes that his hernia has gotten bigger but he denies pain.  Outpatient Encounter Prescriptions as of 06/06/2010  Medication Sig Dispense Refill  . acetaminophen (TYLENOL) 500 MG tablet Take 500 mg by mouth every 6 (six) hours as needed.        Marland Kitchen albuterol (VENTOLIN HFA) 108 (90 BASE) MCG/ACT inhaler Inhale 2 puffs into the lungs every 6 (six) hours as needed.        Marland Kitchen aspirin 81 MG tablet Take 81 mg by mouth daily.        . bisoprolol (ZEBETA) 5 MG tablet Take 5 mg by mouth 2 (two) times daily.        . budesonide-formoterol (SYMBICORT) 80-4.5 MCG/ACT inhaler Inhale 2 puffs into the lungs 2 (two) times daily. RINSE MOUTH AFTER USE       . dofetilide (TIKOSYN) 500 MCG capsule Take 500 mcg by mouth 2 (two) times daily.        . famotidine (PEPCID) 20 MG tablet Take 20 mg by mouth daily as needed.        . ferrous sulfate 325 (65 FE) MG tablet Take 325 mg by mouth 2 (two) times daily.        . furosemide (LASIX) 40 MG tablet Take 1 tablet by mouth twice daily   60 tablet  6  . guaiFENesin (MUCINEX) 600 MG 12 hr tablet Take 1,200 mg by mouth as needed.       . homatropine 5 % ophthalmic solution Place 1 drop into the left eye 3 (three) times daily.        . potassium chloride SA (K-DUR,KLOR-CON) 20 MEQ tablet Take 20 mEq by mouth daily.       . sodium chloride (OCEAN) 0.65 % nasal spray 1 spray by Nasal route as needed.        . tiotropium (SPIRIVA) 18 MCG inhalation capsule Place 18 mcg into inhaler and inhale daily.        Marland Kitchen tobramycin-dexamethasone (TOBRADEX) ophthalmic solution Place 1 drop into the left eye 3 (three) times daily.       Marland Kitchen warfarin (COUMADIN) 2.5 MG tablet USE AS DIRECTED PER ANTICOAGULTION CLINIC       . DISCONTD: pravastatin (PRAVACHOL) 40 MG tablet Take 40 mg by mouth daily.        Marland Kitchen lisinopril (PRINIVIL,ZESTRIL) 2.5 MG tablet Take 1 tablet (2.5 mg total) by mouth daily.  30 tablet  6    Allergies  Allergen Reactions  . Penicillins     REACTION: rash/hives, facial swelling    Past Medical History  Diagnosis Date  .  Atrial fibrillation   . Aortic valve disorders   . Other primary cardiomyopathies   . Edema     CHRONIC LOWER EXTREMITY  . Unspecified essential hypertension   . Other and unspecified hyperlipidemia   . Heart valve replaced by other means   . Other malaise and fatigue   . Coronary atherosclerosis of native coronary artery   . Acute myocardial infarction, subendocardial infarction, subsequent episode of care   . Unspecified adverse effect of unspecified drug, medicinal and biological substance   . Umbilical hernia without mention of obstruction or gangrene   . Sebaceous cyst   . Labyrinthitis, unspecified   . Personal history of other diseases of digestive system   . Special screening for malignant neoplasm of prostate   . BC (bronchogenic carcinoma)     S/P LOBECTOMY 2011    ROS: Negative except as per HPI  BP 128/70  Pulse 68  Resp 20  Ht 6' (1.829 m)  Wt 187 lb 1.9 oz (84.877 kg)  BMI 25.38  kg/m2  PHYSICAL EXAM: Pt is alert and oriented, NAD HEENT: normal Neck: JVP - normal, carotids 2+= without bruits Lungs: CTA bilaterally CV: RRR without murmur or gallop Abd: soft, NT, Positive BS, no hepatomegaly Ext: no C/C/E, distal pulses intact and equal Skin: warm/dry no rash  EKG:  Normal sinus rhythm 69 beats per minute, nonspecific ST and T wave abnormality, prolonged QT with QTC 514 ms  ASSESSMENT AND PLAN:

## 2010-06-06 NOTE — Assessment & Plan Note (Signed)
Patient is status post remote aortic valve replacement with a mechanical valve. He is stable and tolerating warfarin well. His exam is unchanged. Continue with clinical followup.

## 2010-06-06 NOTE — Patient Instructions (Addendum)
Your physician has recommended you make the following change in your medication: Take Tikosyn one capsule twice daily.  Your physician recommends that you schedule a follow-up appointment in: 1 week with the nurse for an EKG,  & in 3 months with Dr. Excell Seltzer.

## 2010-06-07 ENCOUNTER — Encounter: Payer: Self-pay | Admitting: Emergency Medicine

## 2010-06-07 ENCOUNTER — Telehealth: Payer: Self-pay | Admitting: Cardiovascular Disease

## 2010-06-07 MED ORDER — WARFARIN SODIUM 2.5 MG PO TABS: ORAL_TABLET | ORAL | Status: DC

## 2010-06-07 NOTE — Telephone Encounter (Signed)
Pt needs refill on Warfarin 5 mg called to phar.

## 2010-06-10 ENCOUNTER — Encounter: Payer: Self-pay | Admitting: Emergency Medicine

## 2010-06-10 ENCOUNTER — Ambulatory Visit (INDEPENDENT_AMBULATORY_CARE_PROVIDER_SITE_OTHER): Payer: Medicare Other | Admitting: Emergency Medicine

## 2010-06-10 ENCOUNTER — Other Ambulatory Visit: Payer: Self-pay

## 2010-06-10 DIAGNOSIS — J189 Pneumonia, unspecified organism: Secondary | ICD-10-CM

## 2010-06-10 DIAGNOSIS — J449 Chronic obstructive pulmonary disease, unspecified: Secondary | ICD-10-CM

## 2010-06-10 DIAGNOSIS — IMO0001 Reserved for inherently not codable concepts without codable children: Secondary | ICD-10-CM

## 2010-06-10 DIAGNOSIS — M791 Myalgia, unspecified site: Secondary | ICD-10-CM

## 2010-06-10 MED ORDER — WARFARIN SODIUM 5 MG PO TABS: 5.0000 mg | ORAL_TABLET | ORAL | Status: DC

## 2010-06-10 NOTE — Progress Notes (Signed)
  Subjective:    Patient ID: John [name], male    DOB: 1933/11/22, 77 y.o.   MRN: [id number]  HPI 77 yo complicated pt w hx COPD, NCSLCA s/p resection. Parox A Fib that has been assoc with vol overload and dyspnea. Also w pulm infilgtrates, ? 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 Has been in and out of hosp in Feb 2012 due to exacerbations, RVR, etc. Last d/c was 2/20. Feels that his HR is better controlled on Tikosyn.   Last visit we made plan to wean Pred to off (COP vs Amio tox). He feels that his breathing is stable, has been off the pred x 3 days. He is complaining of severe gluteal pain bilaterally radiating to each thigh, started about 3 weeks ago. Of note he started Pravachol about 3 weeks ago.   ROV 06/10/10 -- COPD, NSCLCA w resection, A Fib, ? 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 He follows up his dyspnea, gluteal myalgias. He has been off the prednisone for over a month, seems to be holding his own without the Pred. The myalgias are better off the pravachol, decided w Dr Excell Seltzer not to try an alternative.    Review of Systems As per the HPI.     Objective:   Physical Exam Gen: Pleasant, well-nourished, in no distress,  normal affect, comfortable on O2  ENT: No lesions,  mouth clear,  oropharynx clear, no postnasal drip  Neck: No JVD, no TMG, no carotid bruits  Lungs: No use of accessory muscles, no dullness to percussion, distant, LLL insp crackles, otherwise clear  Cardiovascular: RRR, heart sounds normal, no murmur or gallops, trace peripheral edema  Musculoskeletal: No deformities, no cyanosis or clubbing  Neuro: alert, non focal  Skin: Warm, no lesions or rashes      Assessment & Plan:

## 2010-06-10 NOTE — Assessment & Plan Note (Signed)
Continue O2 at all times.  We will initiate trial off Symbicort, see if you miss it Continue your Spiriva daily Keep track of your symptoms. We will decide next visit whether we should restart the Symbicort Follow up with Dr Donnia Poplaski in 1 month to review.  

## 2010-06-10 NOTE — Assessment & Plan Note (Signed)
Improved off pravachol

## 2010-06-10 NOTE — Assessment & Plan Note (Signed)
Improved, following clinically

## 2010-06-10 NOTE — Patient Instructions (Signed)
Continue O2 at all times.  We will initiate trial off Symbicort, see if you miss it Continue your Spiriva daily Keep track of your symptoms. We will decide next visit whether we should restart the Symbicort Follow up with Dr Delton Coombes in 1 month to review.

## 2010-06-14 ENCOUNTER — Ambulatory Visit (INDEPENDENT_AMBULATORY_CARE_PROVIDER_SITE_OTHER): Payer: Medicare Other

## 2010-06-14 DIAGNOSIS — I4891 Unspecified atrial fibrillation: Secondary | ICD-10-CM

## 2010-06-18 ENCOUNTER — Other Ambulatory Visit: Payer: Self-pay | Admitting: Thoracic Surgery

## 2010-06-18 ENCOUNTER — Ambulatory Visit
Admission: RE | Admit: 2010-06-18 | Discharge: 2010-06-18 | Disposition: A | Discharge status: Home / Self Care | Payer: Medicare Other | Source: Ambulatory Visit | Attending: Thoracic Surgery | Admitting: Thoracic Surgery

## 2010-06-18 ENCOUNTER — Encounter (INDEPENDENT_AMBULATORY_CARE_PROVIDER_SITE_OTHER): Payer: Medicare Other | Admitting: Thoracic Surgery

## 2010-06-18 DIAGNOSIS — C3490 Malignant neoplasm of unspecified part of unspecified bronchus or lung: Secondary | ICD-10-CM

## 2010-06-18 DIAGNOSIS — S271XXA Traumatic hemothorax, initial encounter: Secondary | ICD-10-CM

## 2010-06-19 NOTE — Assessment & Plan Note (Signed)
OFFICE VISIT  Mccallister, Aslan L DOB:  [birth year]                                        Jun 18, 2010 CHART #:  [id number]  HISTORY:  The patient is a 77 year old gentleman, who is status post right video-assisted thoracoscopic surgery and right lower lobectomy done October 16, 2009 by Dr. Edwyna Shell.  He did require a second procedure, exploratory thoracotomy for postoperative bleeding as well. He is seen on today's date in routine office visit follow-up. Currently, he continues to remain on home oxygen therapy, 2 liters nasal cannula continuous.  He denies any new significant complaints, and in fact he recently saw Dr. Delton Coombes and stopped his Symbicort at that time as he feels like he is making some progress and improvement in his severe COPD.  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  For full details, please see the pathology report.  Chest x-ray was obtained on today's date.  It reveals chronic scarring findings in the right lower lobe.  This has somewhat improved in appearance as compared to previous film.  There are no new or acute findings.  PHYSICAL EXAM:  VITAL SIGNS:  Blood pressure 121/74, pulse 66, respirations 20, oxygen saturation is 95% on 2 liters.  GENERAL APPEARANCE:  Well-developed elderly male, who appears chronically ill, in no acute distress.  PULMONARY EXAMINATION:  Reveals diminished breath sounds in the right lower lung fields, otherwise diminished, but clear. CARDIAC EXAMINATION:  Regular rate and rhythm.  Soft systolic murmur. The incision is inspected, well healed, no evidence of infection.  ASSESSMENT:  Mr. Postlewait is stable from a cardiothoracic surgical viewpoint.  He remains chronically ill for multiple comorbidities.  Dr. Scheryl Darter plan is to repeat a CT of the chest in 3 months and see him at that time.  Rowe Clack, P.A.-C.  Sherryll Burger  D:  06/18/2010  T:  06/19/2010  Job:   [id number]  cc:   Rosalyn Gess. Debby Bud, MD Leslye Peer, MD

## 2010-06-20 ENCOUNTER — Ambulatory Visit (INDEPENDENT_AMBULATORY_CARE_PROVIDER_SITE_OTHER): Payer: Medicare Other | Admitting: *Deleted

## 2010-06-20 DIAGNOSIS — I359 Nonrheumatic aortic valve disorder, unspecified: Secondary | ICD-10-CM

## 2010-06-20 DIAGNOSIS — Z954 Presence of other heart-valve replacement: Secondary | ICD-10-CM

## 2010-06-20 DIAGNOSIS — I4891 Unspecified atrial fibrillation: Secondary | ICD-10-CM

## 2010-06-20 LAB — POCT INR: INR: 3.9

## 2010-06-24 ENCOUNTER — Encounter: Payer: Self-pay | Admitting: Emergency Medicine

## 2010-06-25 NOTE — Letter (Signed)
February 13, 2010   Leslye Peer, MD  [address]  [place], [place] [address]   Re:  John [name], John [name]               DOB:  12-18-1933   The patient came today and looks much better.  He is on 2 [name] of oxygen.  His sats were 92%.  His chest x-ray was stable or slightly improved.  His blood pressure is 149/79, pulse 65, respirations 18, sats were 97%.  He had a cardioversion by Dr. Excell Seltzer and was in normal sinus rhythm, but  right after he had been readmitted for respiratory problems, but he has  a sense of feeling much better.  He is on prednisone daily now.  His  incisions are healing and as I mentioned his chest x-ray has improved.  His lungs were clear to auscultation and percussion.  I will plan to see  him back again in 6 weeks with another chest x-ray.  He did ask about,  although his problems when he was in the hospital and we explained to  him about his many complications including atrial fib, hemothorax,  respiratory failure, pneumonia.  I appreciate the opportunity of seeing  the patient.  I am glad he is doing a lot better.   Ines Bloomer, M.D.  Electronically Signed   DPB/MEDQ  D:  02/13/2010  T:  02/14/2010  Job:  [id number]   cc:   Veverly Fells. Excell Seltzer, MD

## 2010-06-25 NOTE — Letter (Signed)
September 26, 2009   Veverly Fells. Excell Seltzer, MD  [address]  [place], [place] [address]   Re:  MERWIN, BREDEN              DOB:  12-31-1933   Dear Dr. Excell Seltzer:   I saw the patient in the office today after he had had an  electromagnetic navigation bronchoscopy by Dr. Delton Coombes and myself.  Unfortunately, his right lower lobe lesion was positive for cancer.  It  is obviously a slow-growing cancer, but the best treatment for this is  either right lower lobectomy or right lower lobe basilar segmentectomy.  As you know he has an aortic valve from St. Jude placed in 1994.  His  latest pulmonary function test showed an FVC 2.95 with an FEV1 of 1.62.  He does have diffusion capacity that is low at 35% and corrected up to  51%.  His blood pressure is 144/78, pulse 57, respirations 20.  I  recommend he have the surgery and he is agreed to this.  We plan to do  this on October 16, 2009 at [facility].  We will do right VATS and  either right lower lobectomy or a right basilar segmentectomy.  As you  know we put him back on his Coumadin and his Lovenox and we will then  stop his Coumadin on October 11, 2009, and put him back on Lovenox  until his surgery.   I appreciate the opportunity of seeing the patient.   Ines Bloomer, M.D.  Electronically Signed   DPB/MEDQ  D:  09/26/2009  T:  09/26/2009  Job:  [id number]   cc:   Leslye Peer, MD

## 2010-06-25 NOTE — Assessment & Plan Note (Signed)
[facility]                            CARDIOLOGY OFFICE NOTE   ABDULAHAD, MEDEROS                     MRN:          [id number]  DATE:04/14/2007                            DOB:          20-Nov-1933    Jamal Collin returned for followup at the [facility] office on  April 14, 2007.  Mr. Canizalez is a 77 year old gentleman who had  longstanding aortic insufficiency as well as cardiomyopathy.  He  underwent aortic valve replacement with a St. Jude mechanical valve 15  years ago.  Mr. Doss continues to do well.  He is presently  asymptomatic and specifically denies chest pain, dyspnea, orthopnea or  PND.  He has chronic edema, mainly affecting the left leg.  He denies  lightheadedness, dizziness or syncope.  He has been less active over the  winter months and has been relatively sedentary during this period.   MEDICATIONS:  1. Aspirin 81 mg daily.  2. Hydrochlorothiazide 25 mg daily.  3. Coumadin as directed.  4. Enalapril 20 mg twice daily.  5. Metoprolol 100 mg twice daily.  6. Potassium chloride 20 mEq twice daily.  7. Caduet 10/20 mg daily.   ALLERGIES:  PENICILLIN.   PHYSICAL EXAM:  Patient is alert and oriented.  He is in no acute  distress. Weight 196, blood pressure 154/72, heart rate 63, respiratory  rate 16.  HEENT:  Normal.  NECK:  Normal carotid upstrokes without bruits.  Jugular venous pressure  is normal.  LUNGS:  Clear bilaterally.  HEART:  Regular rate and rhythm with a normal mechanical A2. No murmurs  or gallops.  ABDOMEN:  Soft, nontender, obese, no organomegaly.  EXTREMITIES:  No clubbing, cyanosis.  There is 1+ edema on the left,  trace edema on the right. Compression stockings are in place.   EKG shows sinus rhythm with a mildly prolonged QT interval and otherwise  normal.   The LDL from January 2009 was 64.  I am awaiting review of his other lab  work.   ASSESSMENT:  1. Aortic insufficiency status post St.  Jude mechanical AVR.  Patient      is doing well and maintaining stable anticoagulation with Coumadin.      Most recent assessment of left ventricular function was August 2007      that demonstrated normal function with an ejection fraction of 60%      with a normal functioning prosthetic aortic valve.  Will continue      current therapy at present.  2. Hypertension.  Mr. Vanhouten's blood pressure control is good at home.      He monitors his pressure regularly and his systolic readings range      between 125 and 135 and with diastolic readings in the 70s.  He has      white coat hypertension.  Did not did not make any changes to his      medical regimen today.  3. Dyslipidemia.  Lipids now available show a cholesterol 138 with an      HDL 33 and LDL of 64.  Continue current Lipitor 20 mg  daily.   For follow up, I would like to see Mr. Racca back in 6 months     Veverly Fells. Excell Seltzer, MD  Electronically Signed    MDC/MedQ  DD: 04/14/2007  DT: 04/14/2007  Job #: [id number]   cc:   Rosalyn Gess. Norins, MD

## 2010-06-25 NOTE — Letter (Signed)
November 20, 2009   Leslye Peer, MD  [address]  [place], [place] [address]   Re:  John [name], John [name]               DOB:  15-Dec-1933   Dear Dr. Delton Coombes:   I saw the patient back today and his blood pressure was 136/66, pulse  64, respirations 18, and sats were 90%.  He is on 2-3 L of oxygen.  His  sats do go down with exertion.  Chest x-ray still shows some patchy  infiltrates bilateral and the right hydropneumothorax which was stable  and I think which are just chronic changes and will take at least weeks  for it to resolve.  Unfortunately, his INR is 6.9, so his Coumadin is  being held.  He will see you next week and I will see him again in 2  weeks with another chest x-ray.  He is having trouble sleeping, so I  gave him a prescription for Ambien 5 mg to take.   Ines Bloomer, M.D.  Electronically Signed   DPB/MEDQ  D:  11/20/2009  T:  11/21/2009  Job:  [id number]   cc:   Veverly Fells. Excell Seltzer, MD

## 2010-06-25 NOTE — Assessment & Plan Note (Signed)
OFFICE VISIT   John [name]  DOB:  06-08-1933                                        December 04, 2009  CHART #:  [id number]   HISTORY:  The patient is a 77 year old gentleman who is status post  right video-assisted thoracoscopy for right lower lobectomy done on  October 16, 2009.  This was complicated postoperatively initially by  bleeding requiring urgent re-exploration.  He had a prolonged hospital  course and did require home oxygen at time of discharge.  He has a  moderately differentiated squamous cell carcinoma 1.7 cm with  lymphovascular invasion with negative margins and negative lymph nodes  for tumor.  On today's date he states overall he does actually feel  better.  He feels as though his breathing has improved and he is having  less shortness of breath.  He does have some occasional chest congestion  primarily at night, but does not have any significant sputum production.  He states he is sleeping better and using minimal Ambien.  He is  concerned about his heart rate.  He does have atrial fibrillation and  with fairly minimal exertion he does get a tachycardic rate into the  100s.  He also has low grade increase in temperature to 99 in the  evenings, but no chills or diaphoresis.   Chest x-ray was obtained on today's date.  It reveals a somewhat  improved right-sided hydropneumothorax.  There do continued to be some  bilateral infiltrates which appear stable.   PHYSICAL EXAM:  Vital Signs:  Blood pressure is 105/65, pulse is 82,  respirations 18, oxygen saturation is 92% on 3 liters nasal cannula.  General:  This is an elderly, chronically ill-appearing male in no acute  distress.  Pulmonary examination reveals diminished breath sounds in the  right base, otherwise clear.  No crackles.  No wheeze currently.  Cardiac examination irregular.  Crisp valve click with soft systolic  murmur.  Incision is inspected, healing well without evidence  of  infection.   ASSESSMENT:  The patient appears to be making slow but steady progress  in his recovery following his pulmonary resection.  I have encouraged  him to continue to aggressively use his incentive spirometer and flutter  valve which he does fairly religiously throughout the day.  I have  encouraged him to try to increase his activities as tolerated.  He saw  Dr. Delton Coombes last week and apparently has stopped taking his Spiriva on his  own.  He appears to be doing adequately since doing that as he felt to  have a significantly dry mouth and causing some urinary symptoms.  He is  scheduled to see Dr. Antoine Poche in mid November, but apparently there is  some effort through Dr. Kavin Leech office to move that appointment up if  possible.  He will continue to take his amiodarone and metoprolol for  heart rate control as well as continued management of his Coumadin for  the atrial fibrillation and mechanical aortic valve with this being  managed through [facility].  We will see him again in 3  weeks with a chest x-ray at that time and prior to that on a p.r.n.  basis.   Rowe Clack, P.A.-C.   Sherryll Burger  D:  12/04/2009  T:  12/05/2009  Job:  [id number]  cc:   Leslye Peer, MD  Rollene Rotunda, MD, [facility]

## 2010-06-25 NOTE — Letter (Signed)
Jun 27, 2009   Elizebeth Koller, PA-C  [address]  [place], [place]  [address]   Re:  John [name], John [name]              DOB:  [birth year]   Dear John [name],   I appreciate the opportunity of seeing the patient.  This 77 year old  patient has a long complicated medical history.  He had an AVR done by  Dr. Particia Lather in 1994 and is presently been followed on Coumadin.  He was found to have a right upper lobe episodes of pneumonia and  shortness of breath and got a chest x-ray that showed a right lower lobe  nodule.  CT scan showed a right lower lobe nodule that was 1.41 mm in  size.  He had a PET scan done on this that showed a standard uptake  value of 1.3 also had some diverticulosis and a small umbilical hernia.  He also has chronic obstructive pulmonary disease.  His PFTs showed FVC  of 1.81 that increased to 1.93 with bronchodilators and FEV-1 of 0.97  that increased to 1.32 with bronchodilators.  DLCO was not done.  His  1.4-cm lesion is in the basilar segments of the right lower lobe.  He  has good exercise tolerance.  He gets short of breath on exertion.  He  has had no hemoptysis, fevers, chills, or excessive sputum, but his  lungs are diffusely edematous.  He quit smoking about 3 weeks ago.   MEDICATIONS:  Aspirin, Coumadin 5 mg a day, enalapril 20 mg daily,  metoprolol 200 mg daily, amlodipine 10 mg daily.   FAMILY HISTORY:  Noncontributory.   SOCIAL HISTORY:  He is married and has one child.  He is retired.  Quit  smoking last week.  He does not drink alcohol on a regular basis.   REVIEW OF SYSTEMS:  GENERAL:  He is 185 pounds.  In general, his weight  has been stable.  CARDIAC:  He has no angina or atrial fibrillation.  PULMONARY:  No hemoptysis, fever, chills, or excessive sputum.  GI:  No nausea, vomiting, constipation, diarrhea.  GU:  No kidney disease, dysuria, or frequent urination.  VASCULAR:  No claudication, DVT, TIAs.  NEUROLOGIC:  No  dizziness, headaches, blackouts, or seizures.  MUSCULOSKELETAL:  No arthritis or joint pain.  PSYCHIATRIC:  No depression or nervousness.  ENT:  No changes in eyesight or hearing.  HEMATOLOGIC:  No problems with bleeding, clotting disorders, or anemia,  but he is on Coumadin.   PHYSICAL EXAMINATION:  Vital Signs:  His blood pressure is 151/81, pulse  57, respirations 18, sats were 94%.  Head, Eyes, Ears, Nose, and Throat:  Unremarkable.  Neck:  Supple without thyromegaly.  There is no  supraclavicular or axillary adenopathy.  Chest:  Clear to auscultation  and percussion.  Heart:  Some distal wheezes.  Regular sinus rhythm with  a valve click and systolic ejection flow murmur.  Abdomen:  Soft.  No  hepatosplenomegaly.  Extremities:  Pulses are 2+.  There is no clubbing  or edema.  He had a median sternotomy incision.   ASSESSMENT AND PLAN:  I think the patient probably has either a  carcinoid tumor.  I would doubt bronchoalveolar cancer.  The other  possibility would be a hamartoma, but this definitely is a nodule that  had not been previously present.  Given the low standard uptake value  and his poor pulmonary function test, I would  agree that pulmonary  evaluation will be needed and I will have him see Dr. Levy Pupa and  discuss the situation with him.  I will see him back again in 2 weeks to  make a final decision and possible surgery, but I am leaning toward  doing a right lower lobe basilar segmentectomy.   Ines Bloomer, M.D.  Electronically Signed   DPB/MEDQ  D:  06/27/2009  T:  06/28/2009  Job:  [id number]   cc:   Rosalyn Gess. Norins, MD  Veverly Fells. Excell Seltzer, MD  Leslye Peer, MD

## 2010-06-25 NOTE — Letter (Signed)
December 25, 2009   Leslye Peer, MD  [address]  [place], [place] [address]   Re:  CORDARO, MUKAI               DOB:  1933-06-18   Dear Molly Maduro,   I saw the patient back today.  His saturations were 90% on 3 liters.  His pulse is 69, respirations 18.  He still is in atrial fib.  His blood  pressure is 132/70.  He is feeling a little better.  I think Dr. Excell Seltzer  talked about cardioversion to sinus rhythm.  I hope that that will be  the case.  His chest x-ray today showed improvement in his  hydropneumothorax and stable chronic interstitial changes.  Overall, I  think the x-ray continues to improve.  He will be seeing you November 25  and I will see him back again in 6 weeks with a chest x-ray.  His  incisions were healing well.   Ines Bloomer, M.D.  Electronically Signed   DPB/MEDQ  D:  12/25/2009  T:  12/25/2009  Job:  [id number]   cc:   Veverly Fells. Excell Seltzer, MD

## 2010-06-25 NOTE — Letter (Signed)
July 11, 2009   Leslye Peer, MD  [address]  [place], [place] [address]   Re:  [name], [name]              DOB:  1933-05-18   Dear John [name],   I saw the patient back today and I reviewed the pulmonary function tests  that he had done and they were improved from what we saw with a FEV1 of  1.62 and an FVC of 2.95.  His diffusion capacity is still 35%.  As you  know, his PET scan was negative and this lesion is located in the right  lower lobe such that I probably would require right lower lobectomy,  although we could possibly get by with a segmentectomy, but again with a  negative PET scan, I think it would be safe to follow him for the  present time.  I will see him back again in 3 months with another CT  scan.  If the lesion remains the same or gets bigger, then I will  probably recommend that he have a right lower lobe basilar  segmentectomy.  He is agreeable with this plan.   Sincerely,   Ines Bloomer, M.D.  Electronically Signed   DPB/MEDQ  D:  07/11/2009  T:  07/11/2009  Job:  [id number]   cc:   Elizebeth Koller, PA-C

## 2010-06-25 NOTE — Assessment & Plan Note (Signed)
[facility]                            CARDIOLOGY OFFICE NOTE   LIDO, MASKE                     MRN:          [id number]  DATE:10/06/2006                            DOB:          1933-09-22    John [name] returns for followup at the [facility] on  October 06, 2006.  He is a delightful 77 year old gentleman who had long-  standing aortic insufficiency and cardiomyopathy.  He underwent aortic  valve replacement with a 25 mm St. Jude valve approximately 14 years  ago.  He has done very well since that time and currently is  asymptomatic from a cardiovascular standpoint.  [name] remains very  active.  He has been maintaining his 2-acre property and he uses riding  as well as push mower.  He manually digs holes and uses a weed-eater,  and also gardens.  With that level of activity, he has no chest pain or  anginal symptoms.  He does have mild stable exertional dyspnea, but this  is unchanged over many years.  He has not had orthopnea or PND.  He does  complain of bilateral lower extremity edema, which is also a chronic  problem.  He wears thigh-high compression stockings.   CURRENT MEDICATIONS:  1. Aspirin 81 mg daily.  2. Hydrochlorothiazide 25 mg daily.  3. Coumadin as directed.  4. Enalapril 20 mg twice daily.  5. Metoprolol 100 mg twice daily.  6. Potassium chloride 20 mEq twice daily.  7. Caduet 10/20 mg daily.   ALLERGIES:  PENICILLIN.   EXAM:  He is alert and oriented, in no acute distress.  Weight 201 pounds, blood pressure 148/74, heart rate is 58, respiratory  rate 16.  HEENT:  Normal.  NECK:  Normal carotid upstrokes without bruits.  Jugular venous pressure  is normal.  LUNGS:  Clear to auscultation bilaterally.  HEART:  Regular rate and rhythm with a soft systolic ejection murmur at  the right upper sternal border and a normal mechanical A2 component of  the second heart sound.  There were no diastolic  murmurs or gallops.  ABDOMEN:  Soft and nontender.  No organomegaly.  No bruits.  EXTREMITIES:  There was no clubbing or cyanosis.  There was 1+ pretibial  edema bilaterally.  Pedal pulses are 2+ bilaterally.   EKG shows sinus bradycardia and is otherwise within normal limits.   ASSESSMENT:  [name] is currently stable from a cardiovascular  standpoint.  His cardiac problems are as follows.  1. History of aortic insufficiency status post St. Jude aortic valve      replacement.  He remains stable.  His most recent echocardiogram      was performed last year and demonstrated normal left ventricular      systolic function with an ejection fraction of 60%.  His prosthetic      valve appeared normal and the mean gradient was 19 mmHg, which is      within expected limits.  Followup was every 6 month physical exams      and repeat echo only if development of symptoms or  physical exam      changes.  He should continue Coumadin.  2. Hypertension.  Blood pressure is minimally elevated today.  He      reports home systolic blood pressures ranging from 130 to 135 and      diastolic blood pressures in the 70s.  We will continue his current      therapy with enalapril, metoprolol, and hydrochlorothiazide.  3. Dyslipidemia.  He is on Caduet.  His lipid abnormality is marked      mainly by a low HDL at present.  Total cholesterol is 105,      triglyceride 155, HDL 30 and LDL was 44.  No change is recommended      today.   For followup, I would like to see [name] back in 6 months.  I  encouraged him regarding his excellent activity level.  He was advised  to elevate his legs as much as possible at home.     Veverly Fells. Excell Seltzer, MD  Electronically Signed    MDC/MedQ  DD: 10/06/2006  DT: 10/07/2006  Job #: [id number]   cc:   Rosalyn Gess. Norins, MD

## 2010-06-25 NOTE — Assessment & Plan Note (Signed)
[facility]                            CARDIOLOGY OFFICE NOTE   CARSIN, RANDAZZO                     MRN:          [id number]  DATE:12/07/2007                            DOB:          05-03-1933    John [name] presents for followup with [facility]iology Office on  December 07, 2007.  He is a 77 year old gentleman with a history of  aortic insufficiency and cardiomyopathy who underwent aortic valve  replacement with St. Jude mechanical valve over 15 years ago.  He also  has chronic lower extremity edema that has been progressive over the  past few months.  He denies chest pain, exertional dyspnea, orthopnea,  or PND.  His leg swelling has been worse on the left than the right.  He  wears compression stockings and elevates his legs.  He does not add salt  to his food.   MEDICATIONS:  1. Aspirin 81 mg daily.  2. Hydrochlorothiazide 25 mg daily.  3. Coumadin as directed.  4. Enalapril 20 mg twice daily.  5. Metoprolol 100 mg twice daily.  6. Potassium 20 mEq twice daily.  7. Caduet 10/20 mg daily.  8. Meclizine 25 mg 2 b.i.d.   ALLERGIES:  PENICILLIN.   PHYSICAL EXAMINATION:  GENERAL:  The patient is alert and oriented.  He  is in no acute distress.  VITAL SIGNS:  Weight is 209 pounds, blood pressure 122/64, heart rate  55, respiratory rate 12.  HEENT:  Normal.  NECK:  Normal carotid upstrokes.  No bruits.  JVP normal.  LUNGS:  Clear bilaterally.  HEART:  Regular rate and rhythm with a normal mechanical A2.  No murmurs  or gallops.  ABDOMEN:  Soft, nontender, obese.  No organomegaly.  EXTREMITIES:  There is at least 2+ edema on the left lower extremity and  1+ edema on the right.  There is no weeping or venous stasis ulcers  present.  Peripheral pulses are intact and equal.   EKG shows normal sinus rhythm with nonspecific T-wave abnormality  present.   ASSESSMENT:  1. Status post aortic valve replacement.  Continues on Coumadin     without bleeding difficulty.  Plan, repeat echo to assess the valve      as well as an assessment of left ventricular function in the      setting of his increased edema.  By exam, the valve sounds intact      and I do not hear any significant aortic insufficiency.  2. Lower extremity edema.  I wrote Mr. Pohle a prescription for Lasix      40 mg daily to be taken in addition with his other medications.  I      asked him to limit this to approximately 5-days use and then to be      used on a p.r.n. basis thereafter.  I will check a metabolic panel      in few weeks.  I suspect the etiology relates to venous      insufficiency.  Dr. Debby Bud gave him contact information for a vein      specialist.  Again, we will repeat an echocardiogram to rule out      significant changes with his cardiac function.  3. Hypertension.  Blood pressure control is ideal.  No changes made in      his regimen.  4. Dyslipidemia.  Lipids are followed by Dr. Debby Bud.  The patient      remains on Caduet.   For followup, I will see Mr. Wulf back in 6 months.     Veverly Fells. Excell Seltzer, MD  Electronically Signed    MDC/MedQ  DD: 12/07/2007  DT: 12/08/2007  Job #: [id number]   cc:   Rosalyn Gess. Norins, MD

## 2010-06-28 NOTE — Assessment & Plan Note (Signed)
[facility]                            CARDIOLOGY OFFICE NOTE   JAZON, JIPSON                     MRN:          [id number]  DATE:04/08/2006                            DOB:          1933/11/24    Mr. Sleight is a very pleasant 77 year old white married male, 13-1/2  years post aortic valve replacement for severe aortic   INCOMPLETE DICTATION     E. Graceann Congress, MD, [facility]  Electronically Signed    EJL/MedQ  DD: 04/08/2006  DT: 04/08/2006  Job #: [id number]

## 2010-06-28 NOTE — Assessment & Plan Note (Signed)
 HEALTHCARE                            CARDIOLOGY OFFICE NOTE   ASCENSION, STFLEUR                     MRN:          [id number]  DATE:04/08/2006                            DOB:          02-06-1934    [name] is a very pleasant 77 year old white male, 13-1/2 years post  aortic valve replacement with a 25-mm St. Jude prosthesis for severe  aortic regurgitation with an initial EF of 25%.  Patient had excellent  result with recent echo revealing EF of 60%.  He also has history of  hypertension and hyperlipidemia.   He has had no cardiac symptoms.   MEDICATIONS:  Include:  1. Aspirin 81.  2. Hydrochlorothiazide 25.  3. Coumadin.  4. Enalapril 20 b.i.d.  5. Metoprolol 100 b.i.d.  6. Potassium chloride 20 b.i.d.  7. Caduet 10/20.   Blood pressure 132/72.  Pulse 56, sinus bradycardia.  GENERAL APPEARANCE:  Normal.  JVP is not elevated.  Carotid pulses palpated with bruits.  LUNGS:  Clear.  CARDIAC EXAM:  Reveals normal valve sounds.  No evidence of aortic  regurgitation.  ABDOMINAL EXAM:  Unremarkable.  EXTREMITIES:  Normal.   EKG:  Sinus bradycardia, nonspecific T changes.   IMPRESSION:  1. Thirteen and a half years status post aortic valve replacement for      severe aortic regurgitation.  2. Hypertension.  3. Hyperlipidemia.   I should note that as above, the EF initially was 25%; had full recovery  following aortic valve replacement.   I have suggested a followup lipid and BMP.  Also, should get an  abdominal ultrasound.  I also suggested that she see Dr. Calton Dach for  followup in 6 months or p.r.n.     E. Graceann Congress, MD, [facility]  Electronically Signed    EJL/MedQ  DD: 04/08/2006  DT: 04/08/2006  Job #: [id number]

## 2010-06-28 NOTE — Assessment & Plan Note (Signed)
[facility]                            CARDIOLOGY OFFICE NOTE   JASMIN, TRUMBULL                     MRN:          [id number]  DATE:04/08/2006                            DOB:          08/06/1933    EKG:  Sinus bradycardia, nonspecific T changes.     Cecil Cranker, MD, [facility]     EJL/MedQ  DD: 04/08/2006  DT: 04/08/2006  Job #: [id number]

## 2010-06-28 NOTE — Assessment & Plan Note (Signed)
[facility]                              CARDIOLOGY OFFICE NOTE   JABIR, DAHLEM                     MRN:          [id number]  DATE:09/25/2005                            DOB:          06-Mar-1933    A very pleasant, 77 year old, white, married male 13 years post-op aortic  valve replacement with a St. Jude prosthesis for severe aortic stenosis and  regurgitation with initial EF of 25%.  LV essentially normalized.  He has no  cardiac symptoms.  His hypertension and lipids have been well controlled.  An MR angiogram in 2004, revealed normal renal arteries and normal abdominal  aorta.   MEDICATIONS:  1. Aspirin 81.  2. Hydrochlorothiazide 25.  3. Coumadin.  4. Enalapril 20 b.i.d.  5. Metoprolol 100 b.i.d.  6. Potassium chloride 20 b.i.d.  7. Caduet 10/20.   PHYSICAL EXAMINATION:  VITAL SIGNS:  Blood pressure is 144/74.  The patient  states it is routinely in the 102s systolic at home.  Pulse 57.  GENERAL APPEARANCE:  Normal.  NECK:  JVP is not elevated.  Carotid pulses palpable without bruits.  LUNGS:  Clear.  CARDIAC:  Reveals normal valve sounds.  No murmur.  ABDOMEN:  Normal.  EXTREMITIES:  Normal.   EKG reveals a sinus bradycardia, otherwise normal.  An echocardiogram today  reveals an EF of 60%, normal prosthetic valve, transaortic rate of 19, the  left atrium is dilated.   DIAGNOSES:  1. Aortic stenosis, 13 years status post St. Jude prosthesis.  2. Hypertension.  3. Hyperlipidemia.   PLAN:  The patient appears to be doing well.  We will see him back in 6  months for CBC, lipid, LFTs.                             Cecil Cranker, MD, [facility]    EJL/MedQ  DD:  09/24/2005  DT:  09/24/2005  Job #:  [id number]

## 2010-07-06 ENCOUNTER — Encounter: Payer: Self-pay | Admitting: Gastroenterology

## 2010-07-10 ENCOUNTER — Ambulatory Visit (INDEPENDENT_AMBULATORY_CARE_PROVIDER_SITE_OTHER): Payer: Medicare Other | Admitting: Emergency Medicine

## 2010-07-10 DIAGNOSIS — J449 Chronic obstructive pulmonary disease, unspecified: Secondary | ICD-10-CM

## 2010-07-10 NOTE — Patient Instructions (Signed)
Continue your Spiriva daily Stay off Symbicort Turn your oxygen up to 3L/min when your are exerting yourself Follow up with Dr Delton Coombes in 3 months or sooner if you have any problems

## 2010-07-10 NOTE — Assessment & Plan Note (Signed)
Stay off SYmbicort Continu Spiriva O2 to 3L/min with exertionb rov 3 mon

## 2010-07-10 NOTE — Progress Notes (Signed)
  Subjective:    Patient ID: John [name], male    DOB: Feb 26, 1933, 77 y.o.   MRN: [id number]  HPI 77 yo complicated pt w hx COPD, NCSLCA s/p resection. Parox A Fib that has been assoc with vol overload and dyspnea. Also w pulm infilgtrates, ? 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 Has been in and out of hosp in Feb 2012 due to exacerbations, RVR, etc. Last d/c was 2/20. Feels that his HR is better controlled on Tikosyn.   Last visit we made plan to wean Pred to off (COP vs Amio tox). He feels that his breathing is stable, has been off the pred x 3 days. He is complaining of severe gluteal pain bilaterally radiating to each thigh, started about 3 weeks ago. Of note he started Pravachol about 3 weeks ago.   ROV 06/10/10 -- COPD, NSCLCA w resection, A Fib, ? 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 He follows up his dyspnea, gluteal myalgias. He has been off the prednisone for over a month, seems to be holding his own without the Pred. The myalgias are better off the pravachol, decided w Dr Excell Seltzer not to try an alternative.   ROV 07/10/10 -- follows up for COPD, restriction from lung resection. 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 He has been doing better since last visit a month ago. He stopped Symbicort as we had planned, doesn't seem to miss it. He is taking the Spiriva. Rare Albuterol use. May be having some increased cough and allergies.   Review of Systems As per the HPI.     Objective:   Physical Exam Gen: Pleasant, well-nourished, in no distress,  normal affect, comfortable on O2  ENT: No lesions,  mouth clear,  oropharynx clear, no postnasal drip  Neck: No JVD, no TMG, no carotid bruits  Lungs: No use of accessory muscles, no dullness to percussion, distant, LLL insp crackles, otherwise clear  Cardiovascular: RRR, heart sounds normal, no murmur or gallops, trace peripheral edema  Musculoskeletal: No deformities, no cyanosis or clubbing  Neuro: alert, non focal  Skin: Warm, no lesions or rashes      Assessment & Plan:

## 2010-07-11 ENCOUNTER — Ambulatory Visit (INDEPENDENT_AMBULATORY_CARE_PROVIDER_SITE_OTHER): Payer: Medicare Other | Admitting: *Deleted

## 2010-07-11 DIAGNOSIS — I359 Nonrheumatic aortic valve disorder, unspecified: Secondary | ICD-10-CM

## 2010-07-11 DIAGNOSIS — Z954 Presence of other heart-valve replacement: Secondary | ICD-10-CM

## 2010-07-11 DIAGNOSIS — I4891 Unspecified atrial fibrillation: Secondary | ICD-10-CM

## 2010-07-18 ENCOUNTER — Encounter: Payer: Medicare Other | Admitting: *Deleted

## 2010-07-23 ENCOUNTER — Telehealth: Payer: Self-pay | Admitting: Cardiovascular Disease

## 2010-07-23 MED ORDER — BISOPROLOL FUMARATE 5 MG PO TABS: 5.0000 mg | ORAL_TABLET | Freq: Two times a day (BID) | ORAL | Status: DC

## 2010-07-23 NOTE — Telephone Encounter (Signed)
Pt was given a Rx for Bisoprolol Fumarate 5mg  BID by Dr. Sherene Sires at the hospital and Rx keeps contacting us about this and we do not respond so pt needs this called in [facility] in  the phone# is [phone]

## 2010-07-25 ENCOUNTER — Ambulatory Visit (INDEPENDENT_AMBULATORY_CARE_PROVIDER_SITE_OTHER): Payer: Medicare Other | Admitting: *Deleted

## 2010-07-25 DIAGNOSIS — I4891 Unspecified atrial fibrillation: Secondary | ICD-10-CM

## 2010-07-25 DIAGNOSIS — I359 Nonrheumatic aortic valve disorder, unspecified: Secondary | ICD-10-CM

## 2010-07-25 DIAGNOSIS — Z954 Presence of other heart-valve replacement: Secondary | ICD-10-CM

## 2010-07-25 LAB — POCT INR: INR: 2.6

## 2010-07-30 ENCOUNTER — Telehealth: Payer: Self-pay | Admitting: Emergency Medicine

## 2010-07-30 NOTE — Telephone Encounter (Signed)
Note: FAX IS IN [facility] FOLDER (AT JUANITA'S DESK). Hazel Sams

## 2010-07-30 NOTE — Telephone Encounter (Signed)
Letter given to John [name] so she may type letter onto letterhead. RB will be back in the office on Thurs., 08/01/2010 and can sign letter at that time. Will fax to [facility] once signed. [facility] fax # is [phone].

## 2010-08-05 NOTE — Telephone Encounter (Signed)
Letter faxed to [facility] at number listed above.

## 2010-08-15 ENCOUNTER — Ambulatory Visit (INDEPENDENT_AMBULATORY_CARE_PROVIDER_SITE_OTHER): Payer: Medicare Other | Admitting: *Deleted

## 2010-08-15 ENCOUNTER — Other Ambulatory Visit: Payer: Self-pay | Admitting: Emergency Medicine

## 2010-08-15 DIAGNOSIS — Z954 Presence of other heart-valve replacement: Secondary | ICD-10-CM

## 2010-08-15 DIAGNOSIS — I359 Nonrheumatic aortic valve disorder, unspecified: Secondary | ICD-10-CM

## 2010-08-15 DIAGNOSIS — I4891 Unspecified atrial fibrillation: Secondary | ICD-10-CM

## 2010-08-26 ENCOUNTER — Other Ambulatory Visit: Payer: Self-pay | Admitting: Thoracic Surgery

## 2010-08-26 DIAGNOSIS — C3490 Malignant neoplasm of unspecified part of unspecified bronchus or lung: Secondary | ICD-10-CM

## 2010-08-30 ENCOUNTER — Encounter: Payer: Self-pay | Admitting: Cardiovascular Disease

## 2010-09-03 ENCOUNTER — Telehealth: Payer: Self-pay | Admitting: *Deleted

## 2010-09-03 ENCOUNTER — Ambulatory Visit (INDEPENDENT_AMBULATORY_CARE_PROVIDER_SITE_OTHER): Payer: Medicare Other | Admitting: Cardiovascular Disease

## 2010-09-03 ENCOUNTER — Ambulatory Visit (INDEPENDENT_AMBULATORY_CARE_PROVIDER_SITE_OTHER): Payer: Medicare Other | Admitting: *Deleted

## 2010-09-03 ENCOUNTER — Encounter: Payer: Self-pay | Admitting: Cardiovascular Disease

## 2010-09-03 DIAGNOSIS — I4891 Unspecified atrial fibrillation: Secondary | ICD-10-CM

## 2010-09-03 DIAGNOSIS — I428 Other cardiomyopathies: Secondary | ICD-10-CM

## 2010-09-03 DIAGNOSIS — Z954 Presence of other heart-valve replacement: Secondary | ICD-10-CM

## 2010-09-03 DIAGNOSIS — I359 Nonrheumatic aortic valve disorder, unspecified: Secondary | ICD-10-CM

## 2010-09-03 DIAGNOSIS — E785 Hyperlipidemia, unspecified: Secondary | ICD-10-CM

## 2010-09-03 NOTE — Telephone Encounter (Signed)
Per Dr. Excell Seltzer pt will need to have BMP and Mg level  drawn.  Can be done at [facility] if pt would like.  Dr. Excell Seltzer called pt and left pt this information and left message for pt to call back.

## 2010-09-03 NOTE — Assessment & Plan Note (Signed)
The patient has nonischemic cardiomyopathy. He is on a beta blocker and ACE inhibitor. He has no active signs of congestive heart failure.

## 2010-09-03 NOTE — Telephone Encounter (Signed)
Spoke with lab at [facility] and their fax number is [phone]. Order for BMP and Mg faxed to this number.

## 2010-09-03 NOTE — Telephone Encounter (Signed)
Spoke with pt and he will go to out pt building near ED at [facility] to have blood work drawn tomorrow.  Will fax order.

## 2010-09-03 NOTE — Progress Notes (Signed)
HPI:  This is a 77 year old gentleman presenting for followup evaluation. He has a history of St. Jude aortic valve replacement many years ago. He has developed paroxysmal atrial fibrillation over the past 12 months related to treatment of lung cancer with a lobectomy.  He was initially treated with amiodarone and electrical cardioversion. However, he developed progressive respiratory failure and there was concern over amiodarone toxicity. The patient ultimately was treated with tikosyn and he is maintaining sinus rhythm over the last 5 months. When he was seen a few months ago his tikosyn dose was decreased from 500-250 mg twice daily because of a prolonged QT interval.  He continues to complain of shortness of breath with low oxygen saturation. However, he reports interval improvement in his breathing. He denies chest pain, palpitations, or leg edema. He denies orthopnea or PND.  Outpatient Encounter Prescriptions as of 09/03/2010  Medication Sig Dispense Refill  . acetaminophen (TYLENOL) 500 MG tablet Take 500 mg by mouth every 6 (six) hours as needed.        Marland Kitchen albuterol (VENTOLIN HFA) 108 (90 BASE) MCG/ACT inhaler Inhale 2 puffs into the lungs every 6 (six) hours as needed.        Marland Kitchen aspirin 81 MG tablet Take 81 mg by mouth daily.        . bisoprolol (ZEBETA) 5 MG tablet Take 1 tablet (5 mg total) by mouth 2 (two) times daily.  60 tablet  6  . dofetilide (TIKOSYN) 250 MCG capsule Take 1 capsule (250 mcg total) by mouth 2 (two) times daily.  60 capsule  6  . famotidine (PEPCID) 20 MG tablet Take 20 mg by mouth daily as needed.        . ferrous sulfate 325 (65 FE) MG tablet Take 325 mg by mouth 2 (two) times daily.        . furosemide (LASIX) 40 MG tablet Take 1 tablet by mouth twice daily  60 tablet  6  . guaiFENesin (MUCINEX) 600 MG 12 hr tablet Take 1,200 mg by mouth as needed.       Marland Kitchen lisinopril (PRINIVIL,ZESTRIL) 2.5 MG tablet Take 1 tablet (2.5 mg total) by mouth daily.  30 tablet  6  . NON  FORMULARY 2 L. Oxygen continuous       . potassium chloride SA (K-DUR,KLOR-CON) 20 MEQ tablet Take 20 mEq by mouth daily.       . sodium chloride (OCEAN) 0.65 % nasal spray 1 spray by Nasal route as needed.        Marland Kitchen SPIRIVA HANDIHALER 18 MCG inhalation capsule INHALE 1 CAPSULE ONCE    DAILY.  30 each  5  . warfarin (COUMADIN) 2.5 MG tablet USE AS DIRECTED PER ANTICOAGULTION CLINIC  60 tablet  2  . warfarin (COUMADIN) 5 MG tablet Take 1 tablet (5 mg total) by mouth as directed.  30 tablet  3    Allergies  Allergen Reactions  . Penicillins     REACTION: rash/hives, facial swelling    Past Medical History  Diagnosis Date  . Atrial fibrillation   . Aortic valve disorders   . Other primary cardiomyopathies   . Edema     CHRONIC LOWER EXTREMITY  . Unspecified essential hypertension   . Other and unspecified hyperlipidemia   . Heart valve replaced by other means   . Other malaise and fatigue   . Coronary atherosclerosis of native coronary artery   . Acute myocardial infarction, subendocardial infarction, subsequent episode of care   .  Unspecified adverse effect of unspecified drug, medicinal and biological substance   . Umbilical hernia without mention of obstruction or gangrene   . Sebaceous cyst   . Labyrinthitis, unspecified   . Personal history of other diseases of digestive system   . Special screening for malignant neoplasm of prostate   . BC (bronchogenic carcinoma)     S/P LOBECTOMY 2011    ROS: Negative except as per HPI  BP 136/74  Pulse 61  Ht 6' (1.829 m)  Wt 185 lb (83.915 kg)  BMI 25.09 kg/m2  PHYSICAL EXAM: Pt is alert and oriented, NAD, wearing O2 per nasal cannula HEENT: normal Neck: JVP - normal, carotids 2+= without bruits Lungs: CTA bilaterally CV: RRR with normal prosthetic aortic closure sound Abd: soft, NT, Positive BS, no hepatomegaly Ext: no C/C/E, distal pulses intact and equal Skin: warm/dry no rash  EKG:  Normal sinus rhythm heart rate 62  beats per minute, nonspecific ST and T wave abnormality, prolonged QT interval with a QT of 514 ms and a QTC of 521 ms  ASSESSMENT AND PLAN:

## 2010-09-03 NOTE — Assessment & Plan Note (Signed)
The patient's exam is stable. He has crisp mechanical heart sounds. He continues on anticoagulation.

## 2010-09-03 NOTE — Telephone Encounter (Signed)
Pt called back regarding lab work to be done.  Please call him back.

## 2010-09-03 NOTE — Patient Instructions (Addendum)
Your physician recommends that you schedule a follow-up appointment in: 4 months.  Your physician recommends that you return for fasting lab work one week prior to office visit with Dr. Excell Seltzer in November.--CBC with diff, BMP, Lipid, Liver--427.31,424.1,272.4

## 2010-09-03 NOTE — Assessment & Plan Note (Signed)
The patient's EKG was carefully reviewed and he continues to have a long QT on tikosyn. Will check an electrolyte panel to include potassium and magnesium. If his electrolytes are within normal limits we will probably have to stop tikosyn due to concerns over his QT interval. If he has either hypokalemia or hypomagnesemia will replete electrolytes and repeat the EKG.  He continues on warfarin for anticoagulation.

## 2010-09-05 ENCOUNTER — Encounter: Payer: Self-pay | Admitting: Cardiovascular Disease

## 2010-09-18 ENCOUNTER — Ambulatory Visit
Admission: RE | Admit: 2010-09-18 | Discharge: 2010-09-18 | Disposition: A | Discharge status: Home / Self Care | Payer: Medicare Other | Source: Ambulatory Visit | Attending: Thoracic Surgery | Admitting: Thoracic Surgery

## 2010-09-18 ENCOUNTER — Ambulatory Visit (INDEPENDENT_AMBULATORY_CARE_PROVIDER_SITE_OTHER): Payer: Medicare Other | Admitting: Thoracic Surgery

## 2010-09-18 DIAGNOSIS — C3490 Malignant neoplasm of unspecified part of unspecified bronchus or lung: Secondary | ICD-10-CM

## 2010-09-19 ENCOUNTER — Ambulatory Visit (INDEPENDENT_AMBULATORY_CARE_PROVIDER_SITE_OTHER): Payer: Medicare Other | Admitting: Emergency Medicine

## 2010-09-19 ENCOUNTER — Encounter: Payer: Self-pay | Admitting: Emergency Medicine

## 2010-09-19 DIAGNOSIS — J449 Chronic obstructive pulmonary disease, unspecified: Secondary | ICD-10-CM

## 2010-09-19 DIAGNOSIS — J309 Allergic rhinitis, unspecified: Secondary | ICD-10-CM

## 2010-09-19 DIAGNOSIS — C3490 Malignant neoplasm of unspecified part of unspecified bronchus or lung: Secondary | ICD-10-CM

## 2010-09-19 MED ORDER — LORATADINE 10 MG PO TABS: 10.0000 mg | ORAL_TABLET | Freq: Every day | ORAL | Status: DC

## 2010-09-19 NOTE — Assessment & Plan Note (Signed)
Currently on Spiriva, is interested in trial off of it at some point.  - trial off Spiriva x 3 weeks, then call our office to decide about restarting - SABA prn

## 2010-09-19 NOTE — Assessment & Plan Note (Signed)
Start loratadine 10mg  qd

## 2010-09-19 NOTE — Assessment & Plan Note (Signed)
OFFICE VISIT  [name], John [name] DOB:  [birth year]                                        September 18, 2010 CHART #:  [id number]  The patient came today.  His blood pressure is 120/57, pulse 68, respirations 20, sats were 93% on 2 liters of oxygen.  He is in normal sinus rhythm and they have recently stopped his antiarrhythmic agent, which was Tikosyn.  The CT scan today showed that the right pleural effusion is markedly decreased, has a small 5-mm nodule in the right middle lobe and another small one in the left upper lobe that are stable.  There is no evidence of recurrence of his cancer.  I will plan to see him back again in 6 months and repeat a CT scan.  He is making slow improvement.  Ines Bloomer, M.D. Electronically Signed  DPB/MEDQ  D:  09/18/2010  T:  09/19/2010  Job:  [id number]

## 2010-09-19 NOTE — Assessment & Plan Note (Signed)
Repeat CT scan of the chest in 6 months

## 2010-09-19 NOTE — Progress Notes (Signed)
  Subjective:    Patient ID: John [name], male    DOB: Jan 22, 1934, 77 y.o.   MRN: [id number]  HPI 77 yo complicated pt w hx COPD, NCSLCA s/p resection. Parox A Fib that has been assoc with vol overload and dyspnea. Also w pulm infilgtrates, ? 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 Has been in and out of hosp in Feb 2012 due to exacerbations, RVR, etc. Last d/c was 2/20. Feels that his HR is better controlled on Tikosyn.   Last visit we made plan to wean Pred to off (COP vs Amio tox). He feels that his breathing is stable, has been off the pred x 3 days. He is complaining of severe gluteal pain bilaterally radiating to each thigh, started about 3 weeks ago. Of note he started Pravachol about 3 weeks ago.   ROV 06/10/10 -- COPD, NSCLCA w resection, A Fib, ? 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 He follows up his dyspnea, gluteal myalgias. He has been off the prednisone for over a month, seems to be holding his own without the Pred. The myalgias are better off the pravachol, decided w Dr Excell Seltzer not to try an alternative.   ROV 07/10/10 -- follows up for COPD, restriction from lung resection. 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 He has been doing better since last visit a month ago. He stopped Symbicort as we had planned, doesn't seem to miss it. He is taking the Spiriva. Rare Albuterol use. May be having some increased cough and allergies.   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 Remains on Spiriva, has been off Symbicort for several months. Had CT scan 8/8 to follow NSCLCA - R effusion is smaller, small basilar nodule that will need follow up. He has been having problems with sneezing and coughing for the last several months. Clear nasal drainage. Has been taken off tykosyn since last visit. Currently in NSR.   Review of Systems As per the HPI.     Objective:   Physical Exam Gen: Pleasant, well-nourished, in no distress,  normal affect, comfortable on O2  ENT: No lesions,  mouth clear,  oropharynx clear,  no postnasal drip  Neck: No JVD, no TMG, no carotid bruits  Lungs: No use of accessory muscles, no dullness to percussion, distant, LLL insp crackles, otherwise clear  Cardiovascular: RRR, heart sounds normal, no murmur or gallops, trace peripheral edema  Musculoskeletal: No deformities, no cyanosis or clubbing  Neuro: alert, non focal  Skin: Warm, no lesions or rashes   Assessment & Plan:  COPD Currently on Spiriva, is interested in trial off of it at some point.  - trial off Spiriva x 3 weeks, then call our office to decide about restarting - SABA prn

## 2010-09-19 NOTE — Patient Instructions (Addendum)
Stop Spiriva for the next 3 weeks.  Call our office in 3 weeks to report on whether your breathing has changed off the medication Use ventolin if needed for shortness of breath Start loratadine 10mg  (Claritin) daily Wear your oxygen at all times, turn up to 3-4L/min when you are exerting.  Follow up with Dr Delton Coombes in 3 months or sooner if needed for shortness of breath.

## 2010-09-24 ENCOUNTER — Ambulatory Visit (INDEPENDENT_AMBULATORY_CARE_PROVIDER_SITE_OTHER): Payer: Medicare Other | Admitting: *Deleted

## 2010-09-24 DIAGNOSIS — I359 Nonrheumatic aortic valve disorder, unspecified: Secondary | ICD-10-CM

## 2010-09-24 DIAGNOSIS — Z954 Presence of other heart-valve replacement: Secondary | ICD-10-CM

## 2010-09-24 DIAGNOSIS — I4891 Unspecified atrial fibrillation: Secondary | ICD-10-CM

## 2010-09-24 LAB — POCT INR: INR: 2

## 2010-10-01 ENCOUNTER — Encounter: Payer: Self-pay | Admitting: Cardiovascular Disease

## 2010-10-08 ENCOUNTER — Ambulatory Visit (INDEPENDENT_AMBULATORY_CARE_PROVIDER_SITE_OTHER): Payer: Medicare Other | Admitting: *Deleted

## 2010-10-08 DIAGNOSIS — I4891 Unspecified atrial fibrillation: Secondary | ICD-10-CM

## 2010-10-08 DIAGNOSIS — I359 Nonrheumatic aortic valve disorder, unspecified: Secondary | ICD-10-CM

## 2010-10-08 DIAGNOSIS — Z954 Presence of other heart-valve replacement: Secondary | ICD-10-CM

## 2010-10-08 LAB — POCT INR: INR: 2.9

## 2010-10-29 ENCOUNTER — Ambulatory Visit (INDEPENDENT_AMBULATORY_CARE_PROVIDER_SITE_OTHER): Payer: Medicare Other | Admitting: *Deleted

## 2010-10-29 DIAGNOSIS — I4891 Unspecified atrial fibrillation: Secondary | ICD-10-CM

## 2010-10-29 DIAGNOSIS — Z954 Presence of other heart-valve replacement: Secondary | ICD-10-CM

## 2010-10-29 DIAGNOSIS — I359 Nonrheumatic aortic valve disorder, unspecified: Secondary | ICD-10-CM

## 2010-10-29 LAB — POCT INR: INR: 2.4

## 2010-11-19 ENCOUNTER — Ambulatory Visit (INDEPENDENT_AMBULATORY_CARE_PROVIDER_SITE_OTHER): Payer: Medicare Other | Admitting: *Deleted

## 2010-11-19 DIAGNOSIS — Z954 Presence of other heart-valve replacement: Secondary | ICD-10-CM

## 2010-11-19 DIAGNOSIS — I359 Nonrheumatic aortic valve disorder, unspecified: Secondary | ICD-10-CM

## 2010-11-19 DIAGNOSIS — I4891 Unspecified atrial fibrillation: Secondary | ICD-10-CM

## 2010-12-09 ENCOUNTER — Ambulatory Visit (INDEPENDENT_AMBULATORY_CARE_PROVIDER_SITE_OTHER): Payer: Medicare Other | Admitting: *Deleted

## 2010-12-09 DIAGNOSIS — I359 Nonrheumatic aortic valve disorder, unspecified: Secondary | ICD-10-CM

## 2010-12-09 DIAGNOSIS — I4891 Unspecified atrial fibrillation: Secondary | ICD-10-CM

## 2010-12-09 DIAGNOSIS — Z7901 Long term (current) use of anticoagulants: Secondary | ICD-10-CM

## 2010-12-09 DIAGNOSIS — Z954 Presence of other heart-valve replacement: Secondary | ICD-10-CM

## 2010-12-09 LAB — POCT INR: INR: 3.2

## 2010-12-17 ENCOUNTER — Telehealth: Payer: Self-pay | Admitting: Emergency Medicine

## 2010-12-17 NOTE — Telephone Encounter (Addendum)
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  Paperwork is at [employer].  Confirmed pt's mailing address.    Antionette Fairy

## 2010-12-17 NOTE — Telephone Encounter (Signed)
Forms placed in RB look at. Pt needs this back before 12/31/10.

## 2010-12-17 NOTE — Telephone Encounter (Signed)
Will forward to [place] [facility] to follow up. Placed documents on Lori's keyboard.

## 2010-12-18 ENCOUNTER — Other Ambulatory Visit: Payer: Self-pay | Admitting: Cardiovascular Disease

## 2010-12-19 NOTE — Telephone Encounter (Signed)
Patient is aware form has been completed and will be mailed to his home address as requested. Address verified with the pt's spouse. Copy sent for scanning.

## 2010-12-19 NOTE — Telephone Encounter (Signed)
Form done 

## 2010-12-30 ENCOUNTER — Other Ambulatory Visit: Payer: Self-pay | Admitting: Cardiovascular Disease

## 2010-12-30 ENCOUNTER — Other Ambulatory Visit (INDEPENDENT_AMBULATORY_CARE_PROVIDER_SITE_OTHER): Payer: Medicare Other | Admitting: *Deleted

## 2010-12-30 DIAGNOSIS — I359 Nonrheumatic aortic valve disorder, unspecified: Secondary | ICD-10-CM

## 2010-12-30 DIAGNOSIS — E785 Hyperlipidemia, unspecified: Secondary | ICD-10-CM

## 2010-12-30 DIAGNOSIS — I10 Essential (primary) hypertension: Secondary | ICD-10-CM

## 2010-12-30 DIAGNOSIS — I4891 Unspecified atrial fibrillation: Secondary | ICD-10-CM

## 2010-12-30 LAB — LIPID PANEL
HDL: 28.4 mg/dL — ABNORMAL LOW (ref >39.00)
Triglycerides: 242 mg/dL — ABNORMAL HIGH (ref 0.0–149.0)

## 2010-12-30 LAB — CBC WITH DIFFERENTIAL/PLATELET
Basophils Absolute: 0.1 10*3/uL (ref 0.0–0.1)
Basophils Relative: 0.7 % (ref 0.0–3.0)
Eosinophils Absolute: 0.4 10*3/uL (ref 0.0–0.7)
Lymphocytes Relative: 26.3 % (ref 12.0–46.0)
MCHC: 33.4 g/dL (ref 30.0–36.0)
Neutrophils Relative %: 61.8 % (ref 43.0–77.0)
RBC: 4.3 Mil/uL (ref 4.22–5.81)
RDW: 14.9 % — ABNORMAL HIGH (ref 11.5–14.6)

## 2010-12-30 LAB — BASIC METABOLIC PANEL
CO2: 26 mEq/L (ref 19–32)
Calcium: 8.9 mg/dL (ref 8.4–10.5)
Creatinine, Ser: 0.9 mg/dL (ref 0.4–1.5)

## 2010-12-30 LAB — HEPATIC FUNCTION PANEL
ALT: 14 U/L (ref 0–53)
Albumin: 4 g/dL (ref 3.5–5.2)
Total Protein: 7.3 g/dL (ref 6.0–8.3)

## 2010-12-30 LAB — LDL CHOLESTEROL, DIRECT: Direct LDL: 87.7 mg/dL

## 2010-12-30 MED ORDER — FUROSEMIDE 40 MG PO TABS: ORAL_TABLET | ORAL | Status: DC

## 2011-01-06 ENCOUNTER — Encounter: Payer: Medicare Other | Admitting: *Deleted

## 2011-01-06 ENCOUNTER — Ambulatory Visit: Payer: Medicare Other | Admitting: Cardiovascular Disease

## 2011-01-09 ENCOUNTER — Ambulatory Visit (INDEPENDENT_AMBULATORY_CARE_PROVIDER_SITE_OTHER): Payer: Medicare Other | Admitting: *Deleted

## 2011-01-09 DIAGNOSIS — Z7901 Long term (current) use of anticoagulants: Secondary | ICD-10-CM

## 2011-01-09 DIAGNOSIS — I359 Nonrheumatic aortic valve disorder, unspecified: Secondary | ICD-10-CM

## 2011-01-09 DIAGNOSIS — Z954 Presence of other heart-valve replacement: Secondary | ICD-10-CM

## 2011-01-09 DIAGNOSIS — I4891 Unspecified atrial fibrillation: Secondary | ICD-10-CM

## 2011-01-09 LAB — POCT INR: INR: 3.3

## 2011-01-09 MED ORDER — WARFARIN SODIUM 5 MG PO TABS: 5.0000 mg | ORAL_TABLET | ORAL | Status: DC

## 2011-01-14 ENCOUNTER — Ambulatory Visit (INDEPENDENT_AMBULATORY_CARE_PROVIDER_SITE_OTHER): Payer: Medicare Other | Admitting: Cardiovascular Disease

## 2011-01-14 ENCOUNTER — Encounter: Payer: Self-pay | Admitting: Cardiovascular Disease

## 2011-01-14 DIAGNOSIS — I10 Essential (primary) hypertension: Secondary | ICD-10-CM

## 2011-01-14 DIAGNOSIS — I359 Nonrheumatic aortic valve disorder, unspecified: Secondary | ICD-10-CM

## 2011-01-14 DIAGNOSIS — I4891 Unspecified atrial fibrillation: Secondary | ICD-10-CM

## 2011-01-14 NOTE — Assessment & Plan Note (Signed)
Maintaining sinus rhythm off tikosyn. The patient is on lifetime anticoagulation with warfarin. His QT interval has improved off of tikosyn.  Will continue his current medical program which includes bisoprolol 5 mg twice daily.

## 2011-01-14 NOTE — Assessment & Plan Note (Signed)
Home blood pressures are excellent. His blood pressures average 120/70. Continue low-dose lisinopril and bisoprolol.

## 2011-01-14 NOTE — Progress Notes (Signed)
HPI:  77 year old gentleman presenting for followup evaluation. The patient has a history of remote St. Jude aortic valve replacement. He developed paroxysmal atrial fibrillation last year following lobectomy for treatment of lung cancer. The patient was treated with amiodarone but developed pulmonary toxicity. He was then treated with tikosyn but developed a prolonged QT interval, even with dose reduction to 250 mg twice daily. This has subsequently been discontinued. The patient remains on home oxygen 24 hours per day.  He reports slow improvement in his breathing. His oxygen saturations are about 98% on 2 L of oxygen when he is at rest. His oxygen saturation drops to about 85% with activity. This is improved from previous when he would drop into the mid 70s.  Subjectively, he still has shortness of breath with exertion. He denies chest pain or pressure. He denies palpitations, lightheadedness, or syncope. He reports mild leg swelling.  Outpatient Encounter Prescriptions as of 01/14/2011  Medication Sig Dispense Refill  . acetaminophen (TYLENOL) 500 MG tablet Take 500 mg by mouth every 6 (six) hours as needed.        Marland Kitchen albuterol (VENTOLIN HFA) 108 (90 BASE) MCG/ACT inhaler Inhale 2 puffs into the lungs every 6 (six) hours as needed.        Marland Kitchen aspirin 81 MG tablet Take 81 mg by mouth daily.        . bisoprolol (ZEBETA) 5 MG tablet Take 1 tablet (5 mg total) by mouth 2 (two) times daily.  60 tablet  6  . famotidine (PEPCID) 20 MG tablet Take 20 mg by mouth daily as needed.        . ferrous sulfate 325 (65 FE) MG tablet Take 325 mg by mouth 2 (two) times daily.        . furosemide (LASIX) 40 MG tablet Take 1 tablet by mouth twice daily  60 tablet  6  . guaiFENesin (MUCINEX) 600 MG 12 hr tablet Take 1,200 mg by mouth as needed.       Marland Kitchen lisinopril (PRINIVIL,ZESTRIL) 2.5 MG tablet TAKE 1 TABLET BY MOUTH EVERY DAY  30 tablet  5  . NON FORMULARY 2 L. Oxygen continuous       . potassium chloride SA  (K-DUR,KLOR-CON) 20 MEQ tablet Take 20 mEq by mouth daily.       . sodium chloride (OCEAN) 0.65 % nasal spray 1 spray by Nasal route as needed.        . warfarin (COUMADIN) 2.5 MG tablet USE AS DIRECTED PER ANTICOAGULTION CLINIC  60 tablet  2  . warfarin (COUMADIN) 5 MG tablet Take 1 tablet (5 mg total) by mouth as directed.  30 tablet  3  . DISCONTD: loratadine (CLARITIN) 10 MG tablet Take 1 tablet (10 mg total) by mouth daily.  30 tablet  11  . DISCONTD: SPIRIVA HANDIHALER 18 MCG inhalation capsule INHALE 1 CAPSULE ONCE    DAILY.  30 each  5    Allergies  Allergen Reactions  . Penicillins     REACTION: rash/hives, facial swelling    Past Medical History  Diagnosis Date  . Atrial fibrillation   . Aortic valve disorders   . Other primary cardiomyopathies   . Edema     CHRONIC LOWER EXTREMITY  . Unspecified essential hypertension   . Other and unspecified hyperlipidemia   . Heart valve replaced by other means   . Other malaise and fatigue   . Coronary atherosclerosis of native coronary artery   . Acute myocardial infarction,  subendocardial infarction, subsequent episode of care   . Unspecified adverse effect of unspecified drug, medicinal and biological substance   . Umbilical hernia without mention of obstruction or gangrene   . Sebaceous cyst   . Labyrinthitis, unspecified   . Personal history of other diseases of digestive system   . Special screening for malignant neoplasm of prostate   . BC (bronchogenic carcinoma)     S/P LOBECTOMY 2011    ROS: Negative except as per HPI  BP 142/79  Pulse 53  Ht 6' (1.829 m)  Wt 83.371 kg (183 lb 12.8 oz)  BMI 24.93 kg/m2  PHYSICAL EXAM: Pt is alert and oriented, NAD HEENT: normal Neck: JVP - normal, carotids 2+= without bruits Lungs: distant but clear CV: RRR with normal mechanical aortic closure sound. Abd: soft, NT, Positive BS, no hepatomegaly Ext: no C/C/E, distal pulses intact and equal Skin: warm/dry no rash  EKG:   Sinus bradycardia with sinus arrhythmia, prolonged QT with QT interval 500 ms, QTC 469 ms. QTC is improved from previous tracing.  ASSESSMENT AND PLAN:

## 2011-01-14 NOTE — Patient Instructions (Signed)
Your physician has recommended you make the following change in your medication:  1) Stop iron.  Your physician wants you to follow-up in: 6 months (June 2013). You will receive a reminder letter in the mail two months in advance. If you don't receive a letter, please call our office to schedule the follow-up appointment.

## 2011-01-16 ENCOUNTER — Ambulatory Visit (INDEPENDENT_AMBULATORY_CARE_PROVIDER_SITE_OTHER): Payer: Medicare Other | Admitting: Emergency Medicine

## 2011-01-16 ENCOUNTER — Encounter: Payer: Self-pay | Admitting: Emergency Medicine

## 2011-01-16 ENCOUNTER — Other Ambulatory Visit: Payer: Self-pay | Admitting: Cardiovascular Disease

## 2011-01-16 DIAGNOSIS — J449 Chronic obstructive pulmonary disease, unspecified: Secondary | ICD-10-CM

## 2011-01-16 DIAGNOSIS — C3490 Malignant neoplasm of unspecified part of unspecified bronchus or lung: Secondary | ICD-10-CM

## 2011-01-16 NOTE — Assessment & Plan Note (Signed)
Appears to be clinically stable off BD's.  He still needs O2 - ? Multifactorial due to hx COP/ILD, Lung resection for CA

## 2011-01-16 NOTE — Patient Instructions (Signed)
We will recheck your oxygen at rest and while walking today Keep albuterol available to use as needed. Repeat CT scan of the chest with Dr Edwyna Shell as planned.  Follow with Dr Delton Coombes in 4 months or sooner if you have any problems.

## 2011-01-16 NOTE — Progress Notes (Signed)
  Subjective:    Patient ID: John [name], male    DOB: 23-Dec-1933, 77 y.o.   MRN: [id number] HPI 77 yo complicated pt w hx COPD, NCSLCA s/p resection. Parox A Fib that has been assoc with vol overload and dyspnea. Also w pulm infilgtrates, ? 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 Has been in and out of hosp in Feb 2012 due to exacerbations, RVR, etc. Last d/c was 2/20. Feels that his HR is better controlled on Tikosyn.   Last visit we made plan to wean Pred to off (COP vs Amio tox). He feels that his breathing is stable, has been off the pred x 3 days. He is complaining of severe gluteal pain bilaterally radiating to each thigh, started about 3 weeks ago. Of note he started Pravachol about 3 weeks ago.   ROV 06/10/10 -- COPD, NSCLCA w resection, A Fib, ? 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 He follows up his dyspnea, gluteal myalgias. He has been off the prednisone for over a month, seems to be holding his own without the Pred. The myalgias are better off the pravachol, decided w Dr Excell Seltzer not to try an alternative.   ROV 07/10/10 -- follows up for COPD, restriction from lung resection. 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 He has been doing better since last visit a month ago. He stopped Symbicort as we had planned, doesn't seem to miss it. He is taking the Spiriva. Rare Albuterol use. May be having some increased cough and allergies.   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 Remains on Spiriva, has been off Symbicort for several months. Had CT scan 8/8 to follow NSCLCA - R effusion is smaller, small basilar nodule that will need follow up. He has been having problems with sneezing and coughing for the last several months. Clear nasal drainage. Has been taken off tykosyn since last visit. Currently in NSR.   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 Last CT chest was 09/18/10 for R basilar nodule. Needs to requalify for o2. Last time we initiated trial off Spiriva. He doesn't believe  he missed it. Never uses or needs the albuterol. He remains active, cleaned the gutters this week w O2 on!.   Review of Systems As per the HPI.     Objective:   Physical Exam Gen: Pleasant, well-nourished, in no distress,  normal affect, comfortable on O2  ENT: No lesions,  mouth clear,  oropharynx clear, no postnasal drip  Neck: No JVD, no TMG, no carotid bruits  Lungs: No use of accessory muscles, no dullness to percussion, distant, LLL insp crackles, otherwise clear  Cardiovascular: RRR, heart sounds normal, no murmur or gallops, trace peripheral edema  Musculoskeletal: No deformities, no cyanosis or clubbing  Neuro: alert, non focal  Skin: Warm, no lesions or rashes   Assessment & Plan:  CARCINOMA, LUNG, NONSMALL CELL For repeat Ct scan in 03/2011.   COPD Appears to be clinically stable off BD's.  He still needs O2 - ? Multifactorial due to hx COP/ILD, Lung resection for CA

## 2011-01-16 NOTE — Assessment & Plan Note (Signed)
For repeat Ct scan in 03/2011.

## 2011-01-18 ENCOUNTER — Other Ambulatory Visit: Payer: Self-pay | Admitting: Cardiovascular Disease

## 2011-01-20 NOTE — Telephone Encounter (Signed)
According to pharmacy they have refills for 2.5mg  ready for patient and do not presently need refill

## 2011-02-06 ENCOUNTER — Ambulatory Visit (INDEPENDENT_AMBULATORY_CARE_PROVIDER_SITE_OTHER): Payer: Medicare Other | Admitting: *Deleted

## 2011-02-06 DIAGNOSIS — Z7901 Long term (current) use of anticoagulants: Secondary | ICD-10-CM

## 2011-02-06 DIAGNOSIS — Z954 Presence of other heart-valve replacement: Secondary | ICD-10-CM

## 2011-02-06 DIAGNOSIS — I359 Nonrheumatic aortic valve disorder, unspecified: Secondary | ICD-10-CM

## 2011-02-06 DIAGNOSIS — I4891 Unspecified atrial fibrillation: Secondary | ICD-10-CM

## 2011-02-12 ENCOUNTER — Encounter: Payer: Medicare Other | Admitting: *Deleted

## 2011-02-20 ENCOUNTER — Other Ambulatory Visit: Payer: Self-pay | Admitting: Thoracic Surgery

## 2011-02-20 DIAGNOSIS — C3490 Malignant neoplasm of unspecified part of unspecified bronchus or lung: Secondary | ICD-10-CM

## 2011-03-03 ENCOUNTER — Other Ambulatory Visit: Payer: Self-pay | Admitting: Cardiovascular Disease

## 2011-03-06 ENCOUNTER — Ambulatory Visit (INDEPENDENT_AMBULATORY_CARE_PROVIDER_SITE_OTHER): Payer: Medicare Other | Admitting: *Deleted

## 2011-03-06 DIAGNOSIS — I359 Nonrheumatic aortic valve disorder, unspecified: Secondary | ICD-10-CM

## 2011-03-06 DIAGNOSIS — Z7901 Long term (current) use of anticoagulants: Secondary | ICD-10-CM

## 2011-03-06 DIAGNOSIS — Z954 Presence of other heart-valve replacement: Secondary | ICD-10-CM

## 2011-03-06 DIAGNOSIS — I4891 Unspecified atrial fibrillation: Secondary | ICD-10-CM

## 2011-04-03 ENCOUNTER — Ambulatory Visit (INDEPENDENT_AMBULATORY_CARE_PROVIDER_SITE_OTHER): Payer: Medicare Other | Admitting: *Deleted

## 2011-04-03 DIAGNOSIS — I4891 Unspecified atrial fibrillation: Secondary | ICD-10-CM

## 2011-04-03 DIAGNOSIS — I359 Nonrheumatic aortic valve disorder, unspecified: Secondary | ICD-10-CM

## 2011-04-03 DIAGNOSIS — Z954 Presence of other heart-valve replacement: Secondary | ICD-10-CM

## 2011-04-03 DIAGNOSIS — Z7901 Long term (current) use of anticoagulants: Secondary | ICD-10-CM

## 2011-04-08 ENCOUNTER — Encounter: Payer: Self-pay | Admitting: Thoracic Surgery

## 2011-04-08 ENCOUNTER — Ambulatory Visit (INDEPENDENT_AMBULATORY_CARE_PROVIDER_SITE_OTHER): Payer: Medicare Other | Admitting: Thoracic Surgery

## 2011-04-08 ENCOUNTER — Ambulatory Visit
Admission: RE | Admit: 2011-04-08 | Discharge: 2011-04-08 | Disposition: A | Discharge status: Home / Self Care | Payer: Medicare Other | Source: Ambulatory Visit | Attending: Thoracic Surgery | Admitting: Thoracic Surgery

## 2011-04-08 VITALS — BP 145/77 | HR 66 | Resp 20 | Ht 72.0 in | Wt 180.0 lb

## 2011-04-08 DIAGNOSIS — C3490 Malignant neoplasm of unspecified part of unspecified bronchus or lung: Secondary | ICD-10-CM

## 2011-04-08 DIAGNOSIS — R918 Other nonspecific abnormal finding of lung field: Secondary | ICD-10-CM

## 2011-04-08 DIAGNOSIS — Z85118 Personal history of other malignant neoplasm of bronchus and lung: Secondary | ICD-10-CM

## 2011-04-08 NOTE — Progress Notes (Signed)
HPI the patient returns today. He still is on 1/2 L of oxygen. CT scan showed no evidence of recurrence of his cancer but he does have severe changes of chronic obstructive pulmonary disease. He does look much better than he did several months ago. His cardiac status is stabilized. He will be followed by Dr. Delton Coombes. His incisions are well-healed. We will see him again as needed.   Current Outpatient Prescriptions  Medication Sig Dispense Refill  . acetaminophen (TYLENOL) 500 MG tablet Take 500 mg by mouth every 6 (six) hours as needed.        Marland Kitchen albuterol (VENTOLIN HFA) 108 (90 BASE) MCG/ACT inhaler Inhale 2 puffs into the lungs every 6 (six) hours as needed.        Marland Kitchen aspirin 81 MG tablet Take 81 mg by mouth daily.        . bisoprolol (ZEBETA) 5 MG tablet TAKE 1 TABLET BY MOUTH TWICE DAILY.  60 tablet  6  . famotidine (PEPCID) 20 MG tablet Take 20 mg by mouth daily as needed.        . furosemide (LASIX) 40 MG tablet Take 1 tablet by mouth twice daily  60 tablet  6  . guaiFENesin (MUCINEX) 600 MG 12 hr tablet Take 1,200 mg by mouth as needed.       Marland Kitchen lisinopril (PRINIVIL,ZESTRIL) 2.5 MG tablet TAKE 1 TABLET BY MOUTH EVERY DAY  30 tablet  5  . NON FORMULARY 2 L. Oxygen continuous       . potassium chloride SA (K-DUR,KLOR-CON) 20 MEQ tablet Take 20 mEq by mouth daily.       . sodium chloride (OCEAN) 0.65 % nasal spray 1 spray by Nasal route as needed.        . warfarin (COUMADIN) 2.5 MG tablet USE AS DIRECTED BY ANTICOAGULATION CLINIC.  40 tablet  3  . warfarin (COUMADIN) 5 MG tablet Take 1 tablet (5 mg total) by mouth as directed.  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 Delton Coombes CT scan in 6 months

## 2011-05-01 ENCOUNTER — Ambulatory Visit (INDEPENDENT_AMBULATORY_CARE_PROVIDER_SITE_OTHER): Payer: Medicare Other | Admitting: Pharmacist

## 2011-05-01 DIAGNOSIS — I359 Nonrheumatic aortic valve disorder, unspecified: Secondary | ICD-10-CM

## 2011-05-01 DIAGNOSIS — I4891 Unspecified atrial fibrillation: Secondary | ICD-10-CM

## 2011-05-01 DIAGNOSIS — Z7901 Long term (current) use of anticoagulants: Secondary | ICD-10-CM

## 2011-05-01 DIAGNOSIS — Z954 Presence of other heart-valve replacement: Secondary | ICD-10-CM

## 2011-05-29 ENCOUNTER — Ambulatory Visit (INDEPENDENT_AMBULATORY_CARE_PROVIDER_SITE_OTHER): Payer: Medicare Other | Admitting: Pharmacist

## 2011-05-29 DIAGNOSIS — Z7901 Long term (current) use of anticoagulants: Secondary | ICD-10-CM

## 2011-05-29 DIAGNOSIS — Z954 Presence of other heart-valve replacement: Secondary | ICD-10-CM

## 2011-05-29 DIAGNOSIS — I359 Nonrheumatic aortic valve disorder, unspecified: Secondary | ICD-10-CM

## 2011-05-29 DIAGNOSIS — I4891 Unspecified atrial fibrillation: Secondary | ICD-10-CM

## 2011-05-29 MED ORDER — WARFARIN SODIUM 5 MG PO TABS: ORAL_TABLET | ORAL | Status: DC

## 2011-06-06 ENCOUNTER — Ambulatory Visit: Payer: Medicare Other | Admitting: Emergency Medicine

## 2011-06-08 ENCOUNTER — Other Ambulatory Visit: Payer: Self-pay | Admitting: Cardiovascular Disease

## 2011-06-12 ENCOUNTER — Ambulatory Visit (INDEPENDENT_AMBULATORY_CARE_PROVIDER_SITE_OTHER): Payer: Medicare Other | Admitting: Emergency Medicine

## 2011-06-12 ENCOUNTER — Encounter: Payer: Self-pay | Admitting: Emergency Medicine

## 2011-06-12 VITALS — BP 114/68 | HR 65 | Temp 98.5°F | Ht 72.0 in | Wt 182.4 lb

## 2011-06-12 DIAGNOSIS — C3490 Malignant neoplasm of unspecified part of unspecified bronchus or lung: Secondary | ICD-10-CM

## 2011-06-12 DIAGNOSIS — J309 Allergic rhinitis, unspecified: Secondary | ICD-10-CM

## 2011-06-12 DIAGNOSIS — J449 Chronic obstructive pulmonary disease, unspecified: Secondary | ICD-10-CM

## 2011-06-12 MED ORDER — GUAIFENESIN ER 600 MG PO TB12: 1200.0000 mg | ORAL_TABLET | ORAL | Status: DC | PRN

## 2011-06-12 MED ORDER — ALBUTEROL SULFATE HFA 108 (90 BASE) MCG/ACT IN AERS: 2.0000 | INHALATION_SPRAY | RESPIRATORY_TRACT | Status: DC | PRN

## 2011-06-12 MED ORDER — FLUTICASONE PROPIONATE 50 MCG/ACT NA SUSP: 2.0000 | Freq: Every day | NASAL | Status: DC | PRN

## 2011-06-12 NOTE — Progress Notes (Signed)
Subjective:    Patient ID: John [name], male    DOB: March 07, 1933, 78 y.o.   MRN: [id number] HPI 78 yo complicated pt w hx COPD, NCSLCA s/p resection. Parox A Fib that has been assoc with vol overload and dyspnea. Also w pulm infilgtrates, ? 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 Has been in and out of hosp in Feb 2012 due to exacerbations, RVR, etc. Last d/c was 2/20. Feels that his HR is better controlled on Tikosyn.   Last visit we made plan to wean Pred to off (COP vs Amio tox). He feels that his breathing is stable, has been off the pred x 3 days. He is complaining of severe gluteal pain bilaterally radiating to each thigh, started about 3 weeks ago. Of note he started Pravachol about 3 weeks ago.   ROV 06/10/10 -- COPD, NSCLCA w resection, A Fib, ? 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 He follows up his dyspnea, gluteal myalgias. He has been off the prednisone for over a month, seems to be holding his own without the Pred. The myalgias are better off the pravachol, decided w Dr Excell Seltzer not to try an alternative.   ROV 07/10/10 -- follows up for COPD, restriction from lung resection. 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 He has been doing better since last visit a month ago. He stopped Symbicort as we had planned, doesn't seem to miss it. He is taking the Spiriva. Rare Albuterol use. May be having some increased cough and allergies.   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 Remains on Spiriva, has been off Symbicort for several months. Had CT scan 8/8 to follow NSCLCA - R effusion is smaller, small basilar nodule that will need follow up. He has been having problems with sneezing and coughing for the last several months. Clear nasal drainage. Has been taken off tykosyn since last visit. Currently in NSR.   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 Last CT chest was 09/18/10 for R basilar nodule. Needs to requalify for o2. Last time we initiated trial off Spiriva. He doesn't believe  he missed it. Never uses or needs the albuterol. He remains active, cleaned the gutters this week w O2 on!.   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 CT scan repeated in 03/2011, stable compared with priors. Now off of BD's and seems to be tolerating well. He continues to do well, has been able to stay active. He often has been able to go without O2 at rest. Has tried allegra and claritin for allergies, hasn't helped him  Review of Systems As per the HPI.     Objective:   Physical Exam Gen: Pleasant, well-nourished, in no distress,  normal affect, comfortable on O2  ENT: No lesions,  mouth clear,  oropharynx clear, no postnasal drip  Neck: No JVD, no TMG, no carotid bruits  Lungs: No use of accessory muscles, no dullness to percussion, distant, LLL insp crackles, otherwise clear  Cardiovascular: RRR, heart sounds normal, no murmur or gallops, trace peripheral edema  Musculoskeletal: No deformities, no cyanosis or clubbing  Neuro: alert, non focal  Skin: Warm, no lesions or rashes   Assessment & Plan:  CARCINOMA, LUNG, NONSMALL CELL Stable by Ct scan 03/2011  COPD Not on BD's right now, seems to be tolerating. His CT scan shows emphysema, some improvement in his ILD.  - O2 to keep SpO2 > 90%  ALLERGIC RHINITIS CAUSE UNSPECIFIED Fluticasone, guaifenesin. Did not feel any benefit from zyrtec or loratadine.

## 2011-06-12 NOTE — Assessment & Plan Note (Signed)
Stable by Ct scan 03/2011

## 2011-06-12 NOTE — Assessment & Plan Note (Signed)
Fluticasone, guaifenesin. Did not feel any benefit from zyrtec or loratadine.

## 2011-06-12 NOTE — Patient Instructions (Signed)
Please continue your oxygen and adjust it to keep your SpO2 > 90% Have Ventolin available to use as needed We will write prescriptions for guaifenesin (Mucinex), flonase and Ventolin We will repeat your CT scan in November 2013 Follow with Dr Delton Coombes in 6 months or sooner if you have any problems.

## 2011-06-12 NOTE — Assessment & Plan Note (Addendum)
Not on BD's right now, seems to be tolerating. His CT scan shows emphysema, some improvement in his ILD.  - O2 to keep SpO2 > 90%

## 2011-07-10 ENCOUNTER — Ambulatory Visit (INDEPENDENT_AMBULATORY_CARE_PROVIDER_SITE_OTHER): Payer: Medicare Other

## 2011-07-10 DIAGNOSIS — I359 Nonrheumatic aortic valve disorder, unspecified: Secondary | ICD-10-CM

## 2011-07-10 DIAGNOSIS — Z954 Presence of other heart-valve replacement: Secondary | ICD-10-CM

## 2011-07-10 DIAGNOSIS — I4891 Unspecified atrial fibrillation: Secondary | ICD-10-CM

## 2011-07-10 DIAGNOSIS — Z7901 Long term (current) use of anticoagulants: Secondary | ICD-10-CM

## 2011-07-15 ENCOUNTER — Encounter: Payer: Self-pay | Admitting: Cardiovascular Disease

## 2011-07-15 ENCOUNTER — Ambulatory Visit (INDEPENDENT_AMBULATORY_CARE_PROVIDER_SITE_OTHER): Payer: Medicare Other | Admitting: Cardiovascular Disease

## 2011-07-15 VITALS — BP 118/68 | HR 56 | Ht 72.0 in | Wt 183.0 lb

## 2011-07-15 DIAGNOSIS — E7800 Pure hypercholesterolemia, unspecified: Secondary | ICD-10-CM

## 2011-07-15 DIAGNOSIS — I4891 Unspecified atrial fibrillation: Secondary | ICD-10-CM

## 2011-07-15 DIAGNOSIS — I359 Nonrheumatic aortic valve disorder, unspecified: Secondary | ICD-10-CM

## 2011-07-15 NOTE — Patient Instructions (Signed)
Your physician wants you to follow-up in: 6 MONTHS with Dr Excell Seltzer.  You will receive a reminder letter in the mail two months in advance. If you don't receive a letter, please call our office to schedule the follow-up appointment.  Your physician has requested that you have an echocardiogram in 6 MONTHS. Echocardiography is a painless test that uses sound waves to create images of your heart. It provides your doctor with information about the size and shape of your heart and how well your heart's chambers and valves are working. This procedure takes approximately one hour. There are no restrictions for this procedure.  Your physician recommends that you return for a FASTING LIPID, LIVER and BMP in 6 MONTHS--nothing to eat or drink after midnight, lab opens at 8:30 (please have these labs drawn one week prior to your appointment with Dr Excell Seltzer)  Your physician recommends that you continue on your current medications as directed. Please refer to the Current Medication list given to you today.

## 2011-07-15 NOTE — Progress Notes (Signed)
HPI:  55 her old gentleman presenting for followup evaluation. The patient has paroxysmal atrial fibrillation, aortic valve disease status post remote St. Jude aortic valve replacement in 1994, and coronary artery disease. He has oxygen-dependent COPD and history of non-small cell lung cancer status post resection.  He remains limited by dyspnea. He notes oxygen saturations drop into the low 80s with low-level activity. His resting saturations have been okay. He denies chest pain or pressure, palpitations, lightheadedness, or syncope. He does have chronic leg edema. He's tolerating warfarin without bleeding complications.  Outpatient Encounter Prescriptions as of 07/15/2011  Medication Sig Dispense Refill  . acetaminophen (TYLENOL) 500 MG tablet Take 500 mg by mouth every 6 (six) hours as needed.        Marland Kitchen albuterol (VENTOLIN HFA) 108 (90 BASE) MCG/ACT inhaler Inhale 2 puffs into the lungs every 4 (four) hours as needed.  1 Inhaler  5  . aspirin 81 MG tablet Take 81 mg by mouth daily.        . bisoprolol (ZEBETA) 5 MG tablet TAKE 1 TABLET BY MOUTH TWICE DAILY.  60 tablet  6  . famotidine (PEPCID) 20 MG tablet Take 20 mg by mouth daily as needed.        . fluticasone (FLONASE) 50 MCG/ACT nasal spray Place 2 sprays into the nose daily as needed.  16 g  5  . furosemide (LASIX) 40 MG tablet Take 1 tablet by mouth twice daily  60 tablet  6  . guaiFENesin (MUCINEX) 600 MG 12 hr tablet Take 2 tablets (1,200 mg total) by mouth as needed.  120 suppository  11  . lisinopril (PRINIVIL,ZESTRIL) 2.5 MG tablet TAKE 1 TABLET BY MOUTH EVERY DAY  30 tablet  5  . NON FORMULARY 2 L. Oxygen continuous       . potassium chloride SA (K-DUR,KLOR-CON) 20 MEQ tablet Take 20 mEq by mouth daily.       . sodium chloride (OCEAN) 0.65 % nasal spray 1 spray by Nasal route as needed.        . warfarin (COUMADIN) 2.5 MG tablet USE AS DIRECTED BY ANTICOAGULATION CLINIC.  40 tablet  3  . warfarin (COUMADIN) 5 MG tablet Take as  directed by [facility].  30 tablet  3    Allergies  Allergen Reactions  . Penicillins     REACTION: rash/hives, facial swelling    Past Medical History  Diagnosis Date  . Atrial fibrillation   . Aortic valve disorders   . Other primary cardiomyopathies   . Edema     CHRONIC LOWER EXTREMITY  . Unspecified essential hypertension   . Other and unspecified hyperlipidemia   . Heart valve replaced by other means   . Other malaise and fatigue   . Coronary atherosclerosis of native coronary artery   . Acute myocardial infarction, subendocardial infarction, subsequent episode of care   . Unspecified adverse effect of unspecified drug, medicinal and biological substance   . Umbilical hernia without mention of obstruction or gangrene   . Sebaceous cyst   . Labyrinthitis, unspecified   . Personal history of other diseases of digestive system   . Special screening for malignant neoplasm of prostate   . BC (bronchogenic carcinoma)     S/P LOBECTOMY 2011    ROS: Negative except as per HPI  BP 118/68  Pulse 56  Ht 6' (1.829 m)  Wt 83.008 kg (183 lb)  BMI 24.82 kg/m2  PHYSICAL EXAM: Pt is alert and  oriented, NAD, on oxygen per nasal cannula HEENT: normal Neck: JVP - normal, carotids 2+= without bruits Lungs: Clear with prolonged expiratory phase CV: Bradycardic and regular with 2/6 systolic ejection murmur at the left upper sternal border and crisp mechanical closure sound Abd: soft, NT, Positive BS, no hepatomegaly Ext: Trace pretibial edema bilaterally, distal pulses intact and equal Skin: warm/dry no rash  EKG:  Sinus bradycardia with rare PACs, prolonged QT with QTC of 465 ms  ASSESSMENT AND PLAN: 1. Paroxysmal atrial fibrillation. The patient is maintaining sinus rhythm. He has been off of Tikosyn now for several  months and has had no recurrence. He remains anticoagulated on warfarin without bleeding problems.  2. Aortic valve disease status post St. Jude aVR,  remote. The patient is approaching 20 years out from his valve replacement surgery. His exam remained stable. Recommend a followup echocardiogram before his next office visit in 6 months.  3. Hyperlipidemia. Last labs in November 2012 showed cholesterol of 158, triglycerides 242, HDL 28, and LDL 88. Followup labs before his 6 month followup visit.  [name] 07/15/2011 1:22 PM

## 2011-07-31 ENCOUNTER — Ambulatory Visit (INDEPENDENT_AMBULATORY_CARE_PROVIDER_SITE_OTHER): Payer: Medicare Other

## 2011-07-31 DIAGNOSIS — I4891 Unspecified atrial fibrillation: Secondary | ICD-10-CM

## 2011-07-31 DIAGNOSIS — I359 Nonrheumatic aortic valve disorder, unspecified: Secondary | ICD-10-CM

## 2011-07-31 DIAGNOSIS — Z954 Presence of other heart-valve replacement: Secondary | ICD-10-CM

## 2011-07-31 DIAGNOSIS — Z7901 Long term (current) use of anticoagulants: Secondary | ICD-10-CM

## 2011-07-31 LAB — POCT INR: INR: 2.3

## 2011-08-18 ENCOUNTER — Other Ambulatory Visit: Payer: Self-pay | Admitting: Cardiovascular Disease

## 2011-08-18 NOTE — Telephone Encounter (Signed)
..   Requested Prescriptions   Pending Prescriptions Disp Refills  . potassium chloride SA (K-DUR,KLOR-CON) 20 MEQ tablet [Pharmacy Med Name: Potassium Cl Er 20 Meq Tabl] 30 tablet 3    Sig: TAKE 1 TABLET ONCE DAILY

## 2011-08-21 ENCOUNTER — Ambulatory Visit (INDEPENDENT_AMBULATORY_CARE_PROVIDER_SITE_OTHER): Payer: Medicare Other

## 2011-08-21 DIAGNOSIS — I4891 Unspecified atrial fibrillation: Secondary | ICD-10-CM

## 2011-08-21 DIAGNOSIS — Z7901 Long term (current) use of anticoagulants: Secondary | ICD-10-CM

## 2011-08-21 DIAGNOSIS — I359 Nonrheumatic aortic valve disorder, unspecified: Secondary | ICD-10-CM

## 2011-08-21 DIAGNOSIS — Z954 Presence of other heart-valve replacement: Secondary | ICD-10-CM

## 2011-08-21 LAB — POCT INR: INR: 3.4

## 2011-08-22 ENCOUNTER — Other Ambulatory Visit: Payer: Self-pay | Admitting: *Deleted

## 2011-08-22 ENCOUNTER — Other Ambulatory Visit: Payer: Self-pay | Admitting: Cardiovascular Disease

## 2011-08-22 DIAGNOSIS — I10 Essential (primary) hypertension: Secondary | ICD-10-CM

## 2011-08-22 MED ORDER — FUROSEMIDE 40 MG PO TABS: ORAL_TABLET | ORAL | Status: DC

## 2011-09-17 ENCOUNTER — Ambulatory Visit (INDEPENDENT_AMBULATORY_CARE_PROVIDER_SITE_OTHER): Payer: Medicare Other | Admitting: *Deleted

## 2011-09-17 DIAGNOSIS — I4891 Unspecified atrial fibrillation: Secondary | ICD-10-CM

## 2011-09-17 DIAGNOSIS — I359 Nonrheumatic aortic valve disorder, unspecified: Secondary | ICD-10-CM

## 2011-09-17 DIAGNOSIS — Z954 Presence of other heart-valve replacement: Secondary | ICD-10-CM

## 2011-09-17 DIAGNOSIS — Z7901 Long term (current) use of anticoagulants: Secondary | ICD-10-CM

## 2011-10-15 ENCOUNTER — Ambulatory Visit (INDEPENDENT_AMBULATORY_CARE_PROVIDER_SITE_OTHER): Payer: Medicare Other | Admitting: Pharmacist

## 2011-10-15 DIAGNOSIS — I4891 Unspecified atrial fibrillation: Secondary | ICD-10-CM

## 2011-10-15 DIAGNOSIS — Z954 Presence of other heart-valve replacement: Secondary | ICD-10-CM

## 2011-10-15 DIAGNOSIS — Z7901 Long term (current) use of anticoagulants: Secondary | ICD-10-CM

## 2011-10-15 DIAGNOSIS — I359 Nonrheumatic aortic valve disorder, unspecified: Secondary | ICD-10-CM

## 2011-10-15 LAB — POCT INR: INR: 2.4

## 2011-11-05 ENCOUNTER — Ambulatory Visit (INDEPENDENT_AMBULATORY_CARE_PROVIDER_SITE_OTHER): Payer: Medicare Other | Admitting: *Deleted

## 2011-11-05 DIAGNOSIS — Z954 Presence of other heart-valve replacement: Secondary | ICD-10-CM

## 2011-11-05 DIAGNOSIS — Z7901 Long term (current) use of anticoagulants: Secondary | ICD-10-CM

## 2011-11-05 DIAGNOSIS — I4891 Unspecified atrial fibrillation: Secondary | ICD-10-CM

## 2011-11-05 DIAGNOSIS — I359 Nonrheumatic aortic valve disorder, unspecified: Secondary | ICD-10-CM

## 2011-11-05 LAB — POCT INR: INR: 3.5

## 2011-12-03 ENCOUNTER — Ambulatory Visit (INDEPENDENT_AMBULATORY_CARE_PROVIDER_SITE_OTHER): Payer: Medicare Other | Admitting: *Deleted

## 2011-12-03 DIAGNOSIS — I4891 Unspecified atrial fibrillation: Secondary | ICD-10-CM

## 2011-12-03 DIAGNOSIS — Z954 Presence of other heart-valve replacement: Secondary | ICD-10-CM

## 2011-12-03 DIAGNOSIS — Z7901 Long term (current) use of anticoagulants: Secondary | ICD-10-CM

## 2011-12-03 DIAGNOSIS — I359 Nonrheumatic aortic valve disorder, unspecified: Secondary | ICD-10-CM

## 2011-12-03 MED ORDER — WARFARIN SODIUM 5 MG PO TABS: ORAL_TABLET | ORAL | Status: DC

## 2011-12-09 ENCOUNTER — Encounter: Payer: Self-pay | Admitting: Emergency Medicine

## 2011-12-09 ENCOUNTER — Other Ambulatory Visit: Payer: Self-pay | Admitting: Cardiovascular Disease

## 2011-12-09 ENCOUNTER — Ambulatory Visit (INDEPENDENT_AMBULATORY_CARE_PROVIDER_SITE_OTHER): Payer: Medicare Other | Admitting: Emergency Medicine

## 2011-12-09 ENCOUNTER — Telehealth: Payer: Self-pay | Admitting: *Deleted

## 2011-12-09 VITALS — BP 112/58 | HR 59 | Temp 97.5°F | Ht 72.0 in | Wt 179.4 lb

## 2011-12-09 DIAGNOSIS — J4489 Other specified chronic obstructive pulmonary disease: Secondary | ICD-10-CM

## 2011-12-09 DIAGNOSIS — J449 Chronic obstructive pulmonary disease, unspecified: Secondary | ICD-10-CM

## 2011-12-09 DIAGNOSIS — J309 Allergic rhinitis, unspecified: Secondary | ICD-10-CM

## 2011-12-09 MED ORDER — AZELASTINE HCL 0.1 % NA SOLN: NASAL | Status: DC

## 2011-12-09 MED ORDER — IPRATROPIUM BROMIDE 0.03 % NA SOLN: 2.0000 | Freq: Two times a day (BID) | NASAL | Status: DC

## 2011-12-09 NOTE — Progress Notes (Signed)
Subjective:    Patient ID: John [name], male    DOB: 12-Nov-1933, 78 y.o.   MRN: [id number] HPI 78 yo complicated pt w hx COPD, NCSLCA s/p resection. Parox A Fib that has been assoc with vol overload and dyspnea. Also w pulm infilgtrates, ? 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 Has been in and out of hosp in Feb 2012 due to exacerbations, RVR, etc. Last d/c was 2/20. Feels that his HR is better controlled on Tikosyn.   Last visit we made plan to wean Pred to off (COP vs Amio tox). He feels that his breathing is stable, has been off the pred x 3 days. He is complaining of severe gluteal pain bilaterally radiating to each thigh, started about 3 weeks ago. Of note he started Pravachol about 3 weeks ago.   ROV 06/10/10 -- COPD, NSCLCA w resection, A Fib, ? 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 He follows up his dyspnea, gluteal myalgias. He has been off the prednisone for over a month, seems to be holding his own without the Pred. The myalgias are better off the pravachol, decided w Dr Excell Seltzer not to try an alternative.   ROV 07/10/10 -- follows up for COPD, restriction from lung resection. 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 He has been doing better since last visit a month ago. He stopped Symbicort as we had planned, doesn't seem to miss it. He is taking the Spiriva. Rare Albuterol use. May be having some increased cough and allergies.   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 Remains on Spiriva, has been off Symbicort for several months. Had CT scan 8/8 to follow NSCLCA - R effusion is smaller, small basilar nodule that will need follow up. He has been having problems with sneezing and coughing for the last several months. Clear nasal drainage. Has been taken off tykosyn since last visit. Currently in NSR.   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 Last CT chest was 09/18/10 for R basilar nodule. Needs to requalify for o2. Last time we initiated trial off Spiriva. He doesn't believe  he missed it. Never uses or needs the albuterol. He remains active, cleaned the gutters this week w O2 on!.   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 CT scan repeated in 03/2011, stable compared with priors. Now off of BD's and seems to be tolerating well. He continues to do well, has been able to stay active. He often has been able to go without O2 at rest. 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 CT scan repeated in 03/2011, stable compared with priors. Now off of BD's and seems to be tolerating well. He has stable DOE - never thought BD's helped him. Having nasal drainage on  guaifenesin. Stopped fluticasone spray. He is using albuterol prn - rarely uses. Still on lisinopril,  very rare cough.   Review of Systems As per the HPI.     Objective:   Physical Exam Filed Vitals:   12/09/11 1216  BP: 112/58  Pulse: 59  Temp: 97.5 F (36.4 C)   Gen: Pleasant, well-nourished, in no distress,  normal affect, comfortable on O2  ENT: No lesions,  mouth clear,  oropharynx clear, no postnasal drip  Neck: No JVD, no TMG, no carotid bruits  Lungs: No use of accessory muscles, no  dullness to percussion, distant, LLL insp crackles, otherwise clear  Cardiovascular: RRR, heart sounds normal, no murmur or gallops, trace peripheral edema  Musculoskeletal: No deformities, no cyanosis or clubbing  Neuro: alert, non focal  Skin: Warm, no lesions or rashes   Assessment & Plan:  COPD Has not benefittede from BD. He has multifactorial dyspnea - not sure that COPD is a large part of this - prn albuterol  ALLERGIC RHINITIS CAUSE UNSPECIFIED Add astelin NS and atrovent NS.

## 2011-12-09 NOTE — Assessment & Plan Note (Signed)
Has not benefittede from BD. He has multifactorial dyspnea - not sure that COPD is a large part of this - prn albuterol

## 2011-12-09 NOTE — Assessment & Plan Note (Signed)
Add astelin NS and atrovent NS.

## 2011-12-09 NOTE — Patient Instructions (Addendum)
Please continue your oxygen and albuterol as you are using them  We will try astelin nasal spray, 2 sprays each nostril 2 -3 times a day We will also order atrovent nasal spray, 2 sprays each side twice a day. Start this if the astelin nasal spray alone isn't helping you  Follow with Dr Delton Coombes in 3 months with a CXR

## 2011-12-09 NOTE — Telephone Encounter (Signed)
Opened in Error.

## 2011-12-15 ENCOUNTER — Other Ambulatory Visit: Payer: Self-pay | Admitting: Cardiovascular Disease

## 2011-12-24 ENCOUNTER — Ambulatory Visit (INDEPENDENT_AMBULATORY_CARE_PROVIDER_SITE_OTHER): Payer: Medicare Other | Admitting: Pharmacist

## 2011-12-24 DIAGNOSIS — I359 Nonrheumatic aortic valve disorder, unspecified: Secondary | ICD-10-CM

## 2011-12-24 DIAGNOSIS — Z7901 Long term (current) use of anticoagulants: Secondary | ICD-10-CM

## 2011-12-24 DIAGNOSIS — Z954 Presence of other heart-valve replacement: Secondary | ICD-10-CM

## 2011-12-24 DIAGNOSIS — I4891 Unspecified atrial fibrillation: Secondary | ICD-10-CM

## 2011-12-24 LAB — POCT INR: INR: 3.8

## 2011-12-25 ENCOUNTER — Ambulatory Visit (INDEPENDENT_AMBULATORY_CARE_PROVIDER_SITE_OTHER)
Admission: RE | Admit: 2011-12-25 | Discharge: 2011-12-25 | Disposition: A | Discharge status: Home / Self Care | Payer: Medicare Other | Source: Ambulatory Visit | Attending: Emergency Medicine | Admitting: Emergency Medicine

## 2011-12-25 DIAGNOSIS — C3490 Malignant neoplasm of unspecified part of unspecified bronchus or lung: Secondary | ICD-10-CM

## 2012-01-14 ENCOUNTER — Ambulatory Visit (HOSPITAL_COMMUNITY): Payer: Medicare Other | Attending: Cardiology | Admitting: Radiology

## 2012-01-14 ENCOUNTER — Ambulatory Visit (INDEPENDENT_AMBULATORY_CARE_PROVIDER_SITE_OTHER): Payer: Medicare Other | Admitting: Pharmacist

## 2012-01-14 ENCOUNTER — Other Ambulatory Visit (INDEPENDENT_AMBULATORY_CARE_PROVIDER_SITE_OTHER): Payer: Medicare Other

## 2012-01-14 DIAGNOSIS — I079 Rheumatic tricuspid valve disease, unspecified: Secondary | ICD-10-CM | POA: Insufficient documentation

## 2012-01-14 DIAGNOSIS — E7800 Pure hypercholesterolemia, unspecified: Secondary | ICD-10-CM

## 2012-01-14 DIAGNOSIS — Z7901 Long term (current) use of anticoagulants: Secondary | ICD-10-CM

## 2012-01-14 DIAGNOSIS — I4891 Unspecified atrial fibrillation: Secondary | ICD-10-CM

## 2012-01-14 DIAGNOSIS — Z954 Presence of other heart-valve replacement: Secondary | ICD-10-CM | POA: Insufficient documentation

## 2012-01-14 DIAGNOSIS — I059 Rheumatic mitral valve disease, unspecified: Secondary | ICD-10-CM | POA: Insufficient documentation

## 2012-01-14 DIAGNOSIS — R0989 Other specified symptoms and signs involving the circulatory and respiratory systems: Secondary | ICD-10-CM | POA: Insufficient documentation

## 2012-01-14 DIAGNOSIS — I252 Old myocardial infarction: Secondary | ICD-10-CM | POA: Insufficient documentation

## 2012-01-14 DIAGNOSIS — I517 Cardiomegaly: Secondary | ICD-10-CM | POA: Insufficient documentation

## 2012-01-14 DIAGNOSIS — E785 Hyperlipidemia, unspecified: Secondary | ICD-10-CM | POA: Insufficient documentation

## 2012-01-14 DIAGNOSIS — C3490 Malignant neoplasm of unspecified part of unspecified bronchus or lung: Secondary | ICD-10-CM | POA: Insufficient documentation

## 2012-01-14 DIAGNOSIS — I359 Nonrheumatic aortic valve disorder, unspecified: Secondary | ICD-10-CM

## 2012-01-14 DIAGNOSIS — I2789 Other specified pulmonary heart diseases: Secondary | ICD-10-CM | POA: Insufficient documentation

## 2012-01-14 DIAGNOSIS — R0609 Other forms of dyspnea: Secondary | ICD-10-CM | POA: Insufficient documentation

## 2012-01-14 DIAGNOSIS — I2510 Atherosclerotic heart disease of native coronary artery without angina pectoris: Secondary | ICD-10-CM | POA: Insufficient documentation

## 2012-01-14 LAB — POCT INR: INR: 2.5

## 2012-01-14 LAB — BASIC METABOLIC PANEL
Calcium: 9.1 mg/dL (ref 8.4–10.5)
GFR: 68.35 mL/min (ref >60.00)
Potassium: 4.4 mEq/L (ref 3.5–5.1)
Sodium: 139 mEq/L (ref 135–145)

## 2012-01-14 LAB — HEPATIC FUNCTION PANEL
ALT: 17 U/L (ref 0–53)
AST: 28 U/L (ref 0–37)
Total Protein: 8 g/dL (ref 6.0–8.3)

## 2012-01-14 LAB — LIPID PANEL
Cholesterol: 148 mg/dL (ref 0–200)
Triglycerides: 165 mg/dL — ABNORMAL HIGH (ref 0.0–149.0)
VLDL: 33 mg/dL (ref 0.0–40.0)

## 2012-01-14 NOTE — Progress Notes (Signed)
Echocardiogram performed.  

## 2012-01-22 ENCOUNTER — Telehealth: Payer: Self-pay | Admitting: Emergency Medicine

## 2012-01-22 NOTE — Telephone Encounter (Signed)
Called pt to make next ov per recall.  The # listed has been disconnected.  Mailed reminder letter on 01/22/12. Leanora Ivanoff

## 2012-01-29 ENCOUNTER — Ambulatory Visit: Payer: Medicare Other | Admitting: Cardiovascular Disease

## 2012-02-09 ENCOUNTER — Ambulatory Visit (INDEPENDENT_AMBULATORY_CARE_PROVIDER_SITE_OTHER): Payer: Medicare Other | Admitting: *Deleted

## 2012-02-09 DIAGNOSIS — Z7901 Long term (current) use of anticoagulants: Secondary | ICD-10-CM

## 2012-02-09 DIAGNOSIS — Z954 Presence of other heart-valve replacement: Secondary | ICD-10-CM

## 2012-02-09 DIAGNOSIS — I4891 Unspecified atrial fibrillation: Secondary | ICD-10-CM

## 2012-02-09 DIAGNOSIS — I359 Nonrheumatic aortic valve disorder, unspecified: Secondary | ICD-10-CM

## 2012-02-26 ENCOUNTER — Ambulatory Visit (INDEPENDENT_AMBULATORY_CARE_PROVIDER_SITE_OTHER): Payer: Medicare Other | Admitting: Adult Health

## 2012-02-26 ENCOUNTER — Encounter: Payer: Self-pay | Admitting: Adult Health

## 2012-02-26 VITALS — BP 108/60 | HR 63 | Temp 102.0°F | Ht 72.0 in | Wt 176.4 lb

## 2012-02-26 DIAGNOSIS — J111 Influenza due to unidentified influenza virus with other respiratory manifestations: Secondary | ICD-10-CM

## 2012-02-26 MED ORDER — PREDNISONE 10 MG PO TABS: ORAL_TABLET | ORAL | Status: DC

## 2012-02-26 MED ORDER — DOXYCYCLINE HYCLATE 100 MG PO TABS: 100.0000 mg | ORAL_TABLET | Freq: Two times a day (BID) | ORAL | Status: AC

## 2012-02-26 NOTE — Assessment & Plan Note (Addendum)
Acute Influenza  W/ URI  Exposure to wife -currently on tamiflu   Plan  Doxycycline 100 mg twice daily for 7 days.  Tamiflu 75 mg twice daily for 5 days.  Push fluids.  Notify Coumadin clinic that you are beginning antibiotics Alternate Advil and Tylenol  as needed for fever and bodyaches.  Mucinex DM twice daily as needed. For cough and congestion Please contact office for sooner follow up if symptoms do not improve or worsen or seek emergency care  follow up Dr. Delton Coombes  As planned and As needed

## 2012-02-26 NOTE — Patient Instructions (Addendum)
Doxycycline 100 mg twice daily for 7 days.  Tamiflu 75 mg twice daily for 5 days.  Push fluids.  Notify Coumadin clinic that you are beginning antibiotics Alternate Advil and Tylenol  as needed for fever and bodyaches.  Mucinex DM twice daily as needed. For cough and congestion Please contact office for sooner follow up if symptoms do not improve or worsen or seek emergency care  follow up Dr. Delton Coombes  As planned and As needed

## 2012-02-26 NOTE — Progress Notes (Signed)
Subjective:    Patient ID: John [name], male    DOB: May 18, 1933, 79 y.o.   MRN: [id number] HPI 79 yo complicated pt w hx COPD, NCSLCA s/p resection. Parox A Fib that has been assoc with vol overload and dyspnea. Also w pulm infilgtrates, ? 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 Has been in and out of hosp in Feb 2012 due to exacerbations, RVR, etc. Last d/c was 2/20. Feels that his HR is better controlled on Tikosyn.   Last visit we made plan to wean Pred to off (COP vs Amio tox). He feels that his breathing is stable, has been off the pred x 3 days. He is complaining of severe gluteal pain bilaterally radiating to each thigh, started about 3 weeks ago. Of note he started Pravachol about 3 weeks ago.   ROV 06/10/10 -- COPD, NSCLCA w resection, A Fib, ? 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 He follows up his dyspnea, gluteal myalgias. He has been off the prednisone for over a month, seems to be holding his own without the Pred. The myalgias are better off the pravachol, decided w Dr Excell Seltzer not to try an alternative.   ROV 07/10/10 -- follows up for COPD, restriction from lung resection. 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 He has been doing better since last visit a month ago. He stopped Symbicort as we had planned, doesn't seem to miss it. He is taking the Spiriva. Rare Albuterol use. May be having some increased cough and allergies.   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 Remains on Spiriva, has been off Symbicort for several months. Had CT scan 8/8 to follow NSCLCA - R effusion is smaller, small basilar nodule that will need follow up. He has been having problems with sneezing and coughing for the last several months. Clear nasal drainage. Has been taken off tykosyn since last visit. Currently in NSR.   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 Last CT chest was 09/18/10 for R basilar nodule. Needs to requalify for o2. Last time we initiated trial off Spiriva. He doesn't believe  he missed it. Never uses or needs the albuterol. He remains active, cleaned the gutters this week w O2 on!.   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 CT scan repeated in 03/2011, stable compared with priors. Now off of BD's and seems to be tolerating well. He continues to do well, has been able to stay active. He often has been able to go without O2 at rest. 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 CT scan repeated in 03/2011, stable compared with priors. Now off of BD's and seems to be tolerating well. He has stable DOE - never thought BD's helped him. Having nasal drainage on  guaifenesin. Stopped fluticasone spray. He is using albuterol prn - rarely uses. Still on lisinopril,  very rare cough.   02/26/2012 Acute OV  Complains of  dry cough x 1 day,alot at night,wife on Tamiflu. sob-same right now, weakness, no wheezing, no cp or tightness,temp 102 now with xtra strength  Tylenol.  Wife began with URI symptoms , seen by PCP this week , dx w/ flu (no swab)  , rx Tamiflu Yesterday he began with dry cough and fever -tmax 102. Body aches today .  No wheezing or increased dypsnea.  No hemoptyiss.  Appetite is fair. No n/v.  No edema.    Review of Systems Constitutional:   No  weight loss, night sweats,   +Fevers, chills, fatigue, or  lassitude.  HEENT:   No headaches,  Difficulty swallowing,  Tooth/dental problems, or  Sore throat,                No sneezing, itching, ear ache,  +nasal congestion, post nasal drip,   CV:  No chest pain,  Orthopnea, PND, swelling in lower extremities, anasarca, dizziness, palpitations, syncope.   GI  No heartburn, indigestion, abdominal pain, nausea, vomiting, diarrhea, change in bowel habits, loss of appetite, bloody stools.   Resp:   No coughing up of blood.    No chest wall deformity  Skin: no rash or lesions.  GU: no dysuria, change  in color of urine, no urgency or frequency.  No flank pain, no hematuria   MS:  No joint pain or swelling.  No decreased range of motion.  No back pain.  Psych:  No change in mood or affect. No depression or anxiety.  No memory loss.         Objective:   Physical Exam  Gen: Pleasant, chronically ill appearing in wheelchair  normal affect, comfortable on O2  ENT: No lesions,  mouth clear,  oropharynx clear, no postnasal drip  Neck: No JVD, no TMG, no carotid bruits  Lungs: No use of accessory muscles, no dullness to percussion, decreased BS in bases , no wheezing or crackles .   Cardiovascular: RRR, heart sounds normal, no murmur or gallops, trace peripheral edema  Musculoskeletal: No deformities, no cyanosis or clubbing  Neuro: alert, non focal  Skin: Warm, no lesions or rashes   Assessment & Plan:

## 2012-02-27 ENCOUNTER — Telehealth: Payer: Self-pay | Admitting: *Deleted

## 2012-02-27 NOTE — Telephone Encounter (Signed)
Pt saw Pulmonary yesterday they gave Tamiflu BID x 5 days, Prednisone 10mg s 4 tabs x2, 3 tabs x2, 2 tabs x2, 1 tab x2 ( 8days) and Doxycycline BID x 7 days. Has CVRR appt S/C for 03/01/12 encouraged pt to keep appt and eat his normal green intake over the weekend.

## 2012-03-01 ENCOUNTER — Ambulatory Visit (INDEPENDENT_AMBULATORY_CARE_PROVIDER_SITE_OTHER): Payer: Medicare Other | Admitting: *Deleted

## 2012-03-01 DIAGNOSIS — Z954 Presence of other heart-valve replacement: Secondary | ICD-10-CM

## 2012-03-01 DIAGNOSIS — I359 Nonrheumatic aortic valve disorder, unspecified: Secondary | ICD-10-CM

## 2012-03-01 DIAGNOSIS — I4891 Unspecified atrial fibrillation: Secondary | ICD-10-CM

## 2012-03-01 DIAGNOSIS — Z7901 Long term (current) use of anticoagulants: Secondary | ICD-10-CM

## 2012-03-01 LAB — POCT INR: INR: 5.9

## 2012-03-04 ENCOUNTER — Telehealth: Payer: Self-pay | Admitting: Emergency Medicine

## 2012-03-04 ENCOUNTER — Ambulatory Visit (INDEPENDENT_AMBULATORY_CARE_PROVIDER_SITE_OTHER): Payer: Medicare Other

## 2012-03-04 ENCOUNTER — Ambulatory Visit (INDEPENDENT_AMBULATORY_CARE_PROVIDER_SITE_OTHER)
Admission: RE | Admit: 2012-03-04 | Discharge: 2012-03-04 | Disposition: A | Discharge status: Home / Self Care | Payer: Medicare Other | Source: Ambulatory Visit | Attending: Emergency Medicine | Admitting: Emergency Medicine

## 2012-03-04 DIAGNOSIS — R0602 Shortness of breath: Secondary | ICD-10-CM

## 2012-03-04 DIAGNOSIS — J449 Chronic obstructive pulmonary disease, unspecified: Secondary | ICD-10-CM

## 2012-03-04 DIAGNOSIS — Z954 Presence of other heart-valve replacement: Secondary | ICD-10-CM

## 2012-03-04 DIAGNOSIS — Z7901 Long term (current) use of anticoagulants: Secondary | ICD-10-CM

## 2012-03-04 DIAGNOSIS — I4891 Unspecified atrial fibrillation: Secondary | ICD-10-CM

## 2012-03-04 DIAGNOSIS — I359 Nonrheumatic aortic valve disorder, unspecified: Secondary | ICD-10-CM

## 2012-03-04 LAB — POCT INR: INR: 2.1

## 2012-03-04 MED ORDER — PREDNISONE 10 MG PO TABS: ORAL_TABLET | ORAL | Status: DC

## 2012-03-04 MED ORDER — WARFARIN SODIUM 5 MG PO TABS: ORAL_TABLET | ORAL | Status: DC

## 2012-03-04 MED ORDER — WARFARIN SODIUM 2.5 MG PO TABS: ORAL_TABLET | ORAL | Status: DC

## 2012-03-04 NOTE — Telephone Encounter (Signed)
CXR reviewed, no new infiltrates, chronic scar present w some hyperinflation.  - extended pred taper as above - will ask him to call if he develops purulent sputum - rov as scheduled

## 2012-03-04 NOTE — Telephone Encounter (Signed)
Called again in ref to previous msg can be reached at [phone].John [name]

## 2012-03-04 NOTE — Telephone Encounter (Signed)
Pt last seen 1.16.14 by TP: Patient Instructions     Doxycycline 100 mg twice daily for 7 days.  Tamiflu 75 mg twice daily for 5 days.  Push fluids.  Notify Coumadin clinic that you are beginning antibiotics  Alternate Advil and Tylenol as needed for fever and bodyaches.  Mucinex DM twice daily as needed. For cough and congestion  Please contact office for sooner follow up if symptoms do not improve or worsen or seek emergency care  follow up Dr. Delton Coombes As planned and As needed     Called spoke with patient's wife Malachi Bonds who reports that while pt is better since last ov, he is "still not well" with rattling in his chest, tightness and cough/congestion.  Denies f/c/s.  Wife concerned pt may have pneumonia and would like a cxr done today.  No openings this afternoon.  Declined ov w/ TP tomorrow.  Requesting to be worked-in w/ RB or other recs.  Dr Delton Coombes please advise, thanks.  NOTE: pt going to coumadin clinic, will be home at 1-1:30pm; ok to leave detailed message if no answer.

## 2012-03-04 NOTE — Telephone Encounter (Signed)
Pt seen in Coumadin Clinic today, pt and pt's wife reports pt having to sleep sitting up in recliner x last 2 nights.  Pt completed Doxycycline course yesterday, and took last 10mg  dosage of Prednisone taper this am.  Pt reports feeling some better since taking Doxy and Prednisone, denies any purulent sputum, but is still SOB worsens at nighttime especially when lays down to sleep.  Pt is in wheelchair today in office which is not normal for him stating he gets more SOB with walking, and is visibly more SOB when talking to me.  Called Dr Delton Coombes discussed pt's above condition, gave verbal order for CXR to be done today at [facility] office order entered, and Prednisone 12 day taper rx sent to [facility] per pt request, pt aware and will pick up today.  Will await call back from Dr Delton Coombes once CXR reviewed this afternoon.

## 2012-03-05 ENCOUNTER — Telehealth: Payer: Self-pay | Admitting: Emergency Medicine

## 2012-03-05 NOTE — Telephone Encounter (Signed)
Notes Recorded by Leslye Peer, MD on 03/04/2012 at 2:16 PM Please call pt to let him know no evidence PNA on CXR, thanks --  Spoke with spouse and is aware of results. Voiced her understanding and nothing further was needed

## 2012-03-05 NOTE — Progress Notes (Signed)
Quick Note:  Pt aware of CXR results per phone msg from 03/05/12. ______

## 2012-03-09 ENCOUNTER — Ambulatory Visit (INDEPENDENT_AMBULATORY_CARE_PROVIDER_SITE_OTHER): Payer: Medicare Other | Admitting: *Deleted

## 2012-03-09 DIAGNOSIS — Z954 Presence of other heart-valve replacement: Secondary | ICD-10-CM

## 2012-03-09 DIAGNOSIS — I359 Nonrheumatic aortic valve disorder, unspecified: Secondary | ICD-10-CM

## 2012-03-09 DIAGNOSIS — Z7901 Long term (current) use of anticoagulants: Secondary | ICD-10-CM

## 2012-03-09 DIAGNOSIS — I4891 Unspecified atrial fibrillation: Secondary | ICD-10-CM

## 2012-03-12 ENCOUNTER — Encounter: Payer: Self-pay | Admitting: Cardiovascular Disease

## 2012-03-12 ENCOUNTER — Ambulatory Visit (INDEPENDENT_AMBULATORY_CARE_PROVIDER_SITE_OTHER): Payer: Medicare Other | Admitting: Cardiovascular Disease

## 2012-03-12 VITALS — BP 140/72 | HR 64 | Ht 72.0 in | Wt 176.0 lb

## 2012-03-12 DIAGNOSIS — I4891 Unspecified atrial fibrillation: Secondary | ICD-10-CM

## 2012-03-12 DIAGNOSIS — I272 Pulmonary hypertension, unspecified: Secondary | ICD-10-CM

## 2012-03-12 DIAGNOSIS — I359 Nonrheumatic aortic valve disorder, unspecified: Secondary | ICD-10-CM

## 2012-03-12 DIAGNOSIS — I2789 Other specified pulmonary heart diseases: Secondary | ICD-10-CM

## 2012-03-12 NOTE — Progress Notes (Signed)
HPI:   79 year-old gentleman presenting for followup evaluation. The patient is followed for paroxysmal atrial fibrillation and aortic valve disease status post St. Jude aortic valve replacement in 1994. He also has coronary artery disease without history of myocardial infarction. The patient has oxygen-dependent COPD and history of non-small cell lung cancer status post resection. He has been much more short of breath ever since undergoing surgery for his lung cancer.  He remains limited by dyspnea. He's recently had an upper respiratory infection but feels like he is improving. He notes that his oxygen saturation drops into the mid 80s with activity. He denies chest pain or pressure, palpitations, leg swelling, or bleeding problems.  Outpatient Encounter Prescriptions as of 03/12/2012  Medication Sig Dispense Refill  . acetaminophen (TYLENOL) 500 MG tablet Take 500 mg by mouth every 6 (six) hours as needed.        Marland Kitchen albuterol (VENTOLIN HFA) 108 (90 BASE) MCG/ACT inhaler Inhale 2 puffs into the lungs every 4 (four) hours as needed.  1 Inhaler  5  . aspirin 81 MG tablet Take 81 mg by mouth daily.        Marland Kitchen azelastine (ASTELIN) 137 MCG/SPRAY nasal spray 2 sprays each nostril 2 -3 times a day  30 mL  12  . bisoprolol (ZEBETA) 5 MG tablet TAKE 1 TABLET BY MOUTH TWICE DAILY.  60 tablet  9  . diphenhydrAMINE (SOMINEX) 25 MG tablet Take 25 mg by mouth. Take a twice a day by mouth      . famotidine (PEPCID) 20 MG tablet Take 20 mg by mouth daily as needed.        . fluticasone (FLONASE) 50 MCG/ACT nasal spray Place 2 sprays into the nose daily as needed.  16 g  5  . furosemide (LASIX) 40 MG tablet Take 1 tablet by mouth twice daily  60 tablet  9  . guaiFENesin (MUCINEX) 600 MG 12 hr tablet Take 2 tablets (1,200 mg total) by mouth as needed.  120 suppository  11  . ipratropium (ATROVENT) 0.03 % nasal spray Place 2 sprays into the nose every 12 (twelve) hours.  30 mL  12  . lisinopril (PRINIVIL,ZESTRIL)  2.5 MG tablet TAKE 1 TABLET BY MOUTH EVERY DAY  30 tablet  6  . NON FORMULARY 2 L. Oxygen continuous       . potassium chloride SA (K-DUR,KLOR-CON) 20 MEQ tablet TAKE 1 TABLET ONCE DAILY  30 tablet  6  . predniSONE (DELTASONE) 10 MG tablet 4 tabs for 3 days, then 3 tabs for 3 days, 2 tabs for 3 days, then 1 tab for 3 days, then stop  30 tablet  0  . sodium chloride (OCEAN) 0.65 % nasal spray 1 spray by Nasal route as needed.        . warfarin (COUMADIN) 2.5 MG tablet Take as directed by anticoagulation clinic  30 tablet  3  . warfarin (COUMADIN) 5 MG tablet Take as directed by [place] [facility].  30 tablet  3    Allergies  Allergen Reactions  . Penicillins     REACTION: rash/hives, facial swelling    Past Medical History  Diagnosis Date  . Atrial fibrillation   . Aortic valve disorders   . Other primary cardiomyopathies   . Edema     CHRONIC LOWER EXTREMITY  . Unspecified essential hypertension   . Other and unspecified hyperlipidemia   . Heart valve replaced by other means   . Other malaise and  fatigue   . Coronary atherosclerosis of native coronary artery   . Acute myocardial infarction, subendocardial infarction, subsequent episode of care   . Unspecified adverse effect of unspecified drug, medicinal and biological substance   . Umbilical hernia without mention of obstruction or gangrene   . Sebaceous cyst   . Labyrinthitis, unspecified   . Personal history of other diseases of digestive system   . Special screening for malignant neoplasm of prostate   . BC (bronchogenic carcinoma)     S/P LOBECTOMY 2011    ROS: Negative except as per HPI  BP 140/72  Pulse 64  Ht 6' (1.829 m)  Wt 79.833 kg (176 lb)  BMI 23.87 kg/m2  PHYSICAL EXAM: Pt is alert and oriented, pleasant elderly male in NAD, patient on O2 per nasal cannula HEENT: normal Neck: JVP - normal, carotids 2+= without bruits Lungs: CTA bilaterally CV: RRR with loud P2 and a normal mechanical aortic  closure sound Abd: soft, NT, Positive BS, no hepatomegaly Ext: no C/C/E, distal pulses intact and equal Skin: warm/dry no rash  EKG:  Sinus bradycardia heart rate 58 beats per minute with sinus arrhythmia, QT interval 472 ms, QTC 463 ms. Nonspecific ST abnormality noted.  2D ECHO: Study Conclusions  - Left ventricle: The cavity size was normal. Wall thickness was increased in a pattern of mild LVH. Systolic function was normal. The estimated ejection fraction was in the range of 50% to 55%. Wall motion was normal; there were no regional wall motion abnormalities. Features are consistent with a pseudonormal left ventricular filling pattern, with concomitant abnormal relaxation and increased filling pressure (grade 2 diastolic dysfunction). - Aortic valve: A prosthesis was present and functioning normally. The prosthesis had a normal range of motion. The sewing ring appeared normal, had no rocking motion, and showed no evidence of dehiscence. Mean gradient: 5mm Hg (S). Peak gradient: 10mm Hg (S). - Mitral valve: Mild regurgitation. - Left atrium: The atrium was moderately to severely dilated. - Right atrium: The atrium was mildly dilated. - Tricuspid valve: Moderate regurgitation. - Pulmonary arteries: PA peak pressure: 81mm Hg (S).  ASSESSMENT AND PLAN: 1. Paroxysmal atrial fibrillation. The patient is maintaining sinus rhythm on Tikosyn. He will continue anticoagulation with warfarin. Overall appears stable from a cardiac perspective.  2. Aortic valve disease status post St. Jude aortic valve replacement. Normal function of his valve prosthesis on recent echocardiogram. Recommend clinical followup in 6 months.  3. Hyperlipidemia. The patient is statin intolerant. He had marked pain and weakness when he last tried pravastatin. Continue observation.  4. Severe pulmonary hypertension. Suspect related to chronic lung disease and chronic hypoxemia.  I discussed his case with Dr Delton Coombes  today who is scheduled to see him back in follow-up soon. I advised Mr. Garrette that the most important issue is can be maintaining a good oxygen saturation with supplemental O2. He is going to increase his supplemental oxygen with activity, which is when his O2 saturation generally drops. We discussed consideration of pulmonary vasodilator therapy and he will review this with Dr. Delton Coombes when he sees him next week. I have recommended consideration of a right heart catheterization, but the patient is a bit reluctant. I think this may help characterize his hemodynamics and determine whether he might benefit from a selective pulmonary vasodilator. If he decides to proceed with this, I will set up with Dr. Gala Romney to do.  [name] 03/12/2012 11:53 AM

## 2012-03-12 NOTE — Patient Instructions (Addendum)
Please liberalize oxygen to keep oxygen saturation above 88%  Your physician recommends that you continue on your current medications as directed. Please refer to the Current Medication list given to you today.  We will determine further plans after your appointment with Dr Delton Coombes.

## 2012-03-18 ENCOUNTER — Encounter: Payer: Self-pay | Admitting: Emergency Medicine

## 2012-03-18 ENCOUNTER — Ambulatory Visit (INDEPENDENT_AMBULATORY_CARE_PROVIDER_SITE_OTHER): Payer: Medicare Other | Admitting: Emergency Medicine

## 2012-03-18 ENCOUNTER — Ambulatory Visit (INDEPENDENT_AMBULATORY_CARE_PROVIDER_SITE_OTHER)
Admission: RE | Admit: 2012-03-18 | Discharge: 2012-03-18 | Disposition: A | Discharge status: Home / Self Care | Payer: Medicare Other | Source: Ambulatory Visit | Attending: Emergency Medicine | Admitting: Emergency Medicine

## 2012-03-18 VITALS — BP 104/60 | HR 61 | Temp 97.0°F | Ht 72.0 in | Wt 178.2 lb

## 2012-03-18 DIAGNOSIS — IMO0002 Reserved for concepts with insufficient information to code with codable children: Secondary | ICD-10-CM | POA: Insufficient documentation

## 2012-03-18 DIAGNOSIS — J449 Chronic obstructive pulmonary disease, unspecified: Secondary | ICD-10-CM

## 2012-03-18 DIAGNOSIS — J4489 Other specified chronic obstructive pulmonary disease: Secondary | ICD-10-CM

## 2012-03-18 DIAGNOSIS — J189 Pneumonia, unspecified organism: Secondary | ICD-10-CM

## 2012-03-18 DIAGNOSIS — I2789 Other specified pulmonary heart diseases: Secondary | ICD-10-CM

## 2012-03-18 MED ORDER — FLUTICASONE FUROATE-VILANTEROL 100-25 MCG/INH IN AEPB: 1.0000 | INHALATION_SPRAY | Freq: Every day | RESPIRATORY_TRACT | Status: DC

## 2012-03-18 MED ORDER — ACLIDINIUM BROMIDE 400 MCG/ACT IN AEPB: 1.0000 | INHALATION_SPRAY | Freq: Two times a day (BID) | RESPIRATORY_TRACT | Status: DC

## 2012-03-18 NOTE — Patient Instructions (Addendum)
We will increase your oxygen to 2L/min at rest and to 4L/min with exertion We will start Tudorza twice a day We will start Breo once a day Your pulmonary pressures are elevated significantly, but we need to treat your underlying lung disease more aggressively before proceeding with a right heart catherization. We will discuss further with Dr Excell Seltzer after I see you back Follow with Dr Delton Coombes in 6 weeks or sooner if you have any problems

## 2012-03-18 NOTE — Progress Notes (Signed)
Subjective:    Patient ID: John [name], male    DOB: [birth year], 79 y.o.   MRN: [id number] HPI 79 yo complicated pt w hx COPD, NCSLCA s/p resection. Parox A Fib that has been assoc with vol overload and dyspnea. Also w pulm infilgtrates, ? 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 Has been in and out of hosp in Feb 2012 due to exacerbations, RVR, etc. Last d/c was 2/20. Feels that his HR is better controlled on Tikosyn.   Last visit we made plan to wean Pred to off (COP vs Amio tox). He feels that his breathing is stable, has been off the pred x 3 days. He is complaining of severe gluteal pain bilaterally radiating to each thigh, started about 3 weeks ago. Of note he started Pravachol about 3 weeks ago.   ROV 06/10/10 -- COPD, NSCLCA w resection, A Fib, ? 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 He follows up his dyspnea, gluteal myalgias. He has been off the prednisone for over a month, seems to be holding his own without the Pred. The myalgias are better off the pravachol, decided w Dr Excell Seltzer not to try an alternative.   ROV 07/10/10 -- follows up for COPD, restriction from lung resection. 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 He has been doing better since last visit a month ago. He stopped Symbicort as we had planned, doesn't seem to miss it. He is taking the Spiriva. Rare Albuterol use. May be having some increased cough and allergies.   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 Remains on Spiriva, has been off Symbicort for several months. Had CT scan 8/8 to follow NSCLCA - R effusion is smaller, small basilar nodule that will need follow up. He has been having problems with sneezing and coughing for the last several months. Clear nasal drainage. Has been taken off tykosyn since last visit. Currently in NSR.   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 Last CT chest was 09/18/10 for R basilar nodule. Needs to requalify for o2. Last time we initiated trial off Spiriva. He doesn't believe  he missed it. Never uses or needs the albuterol. He remains active, cleaned the gutters this week w O2 on!.   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 CT scan repeated in 03/2011, stable compared with priors. Now off of BD's and seems to be tolerating well. He continues to do well, has been able to stay active. He often has been able to go without O2 at rest. 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 CT scan repeated in 03/2011, stable compared with priors. Now off of BD's and seems to be tolerating well. He has stable DOE - never thought BD's helped him. Having nasal drainage on  guaifenesin. Stopped fluticasone spray. He is using albuterol prn - rarely uses. Still on lisinopril,  very rare cough.   Acute OV 02/26/12 --  Complains of  dry cough x 1 day,alot at night,wife on Tamiflu. sob-same right now, weakness, no wheezing, no cp or tightness,temp 102 now with xtra strength  Tylenol.  Wife began with URI symptoms , seen by PCP this week , dx w/ flu (no swab)  , rx Tamiflu Yesterday he began with dry cough and fever -tmax 102. Body aches today .  No wheezing or increased dypsnea.  No  hemoptyiss. Appetite is fair. No n/v.  No edema.   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 CT scan repeated in 03/2011, stable compared with priors. Had bee BD's (he never believed he benefited) and tolerating as of last visit 10/13, but since then has had problems >> treated for an AE in January as above by TP. Also followed with Dr Excell Seltzer >> TTE from 12/13 with estimated peak PAP 81 mmHg (was 63 in 10/2009, normal in 11/09). His CT scan 11/13 without significant ILD, but severe emphysematous changes. CXR today with chronic R vol loss and effusion (post-lobectomy). He feels better now from the acute exacerbation. He knows that  he desaturates w exertion >> to 85% with changing clothes or showering.       Objective:   Physical Exam Filed Vitals:   03/18/12 1210  BP: 104/60  Pulse: 61  Temp: 97 F (36.1 C)    Gen: Pleasant, chronically ill appearing in wheelchair  normal affect, comfortable on O2  ENT: No lesions,  mouth clear,  oropharynx clear, no postnasal drip  Neck: No JVD, no TMG, no carotid bruits  Lungs: No use of accessory muscles, no dullness to percussion, decreased BS in bases , no wheezing or crackles .   Cardiovascular: RRR, heart sounds normal, no murmur or gallops, trace peripheral edema  Musculoskeletal: No deformities, no cyanosis or clubbing  Neuro: alert, non focal  Skin: Warm, no lesions or rashes    12/25/11 --  Comparison: 02/26/2013and 01/23/2010  Findings: Status post right lower lobectomy. Extensive  centrilobular emphysematous changes. Bullous changes at the right  lung base.  6 mm left lower lobe nodule along the major fissure (series 3/image  29), unchanged from 2011, benign. Possible 5 mm nodule at the  right lung base (series 3/image 42), unchanged.  Pleural thickening/fluid at the right lung base, unchanged. No  pneumothorax.  Visualized thyroid is unremarkable.  Mild cardiomegaly. No pericardial effusion. Coronary  atherosclerosis. Atherosclerotic calcifications of the aortic  arch. Status post aortic valve replacement.  Visualized upper abdomen is notable for cholelithiasis and a 2.1 cm  right upper pole cyst.  Degenerative changes of the visualized thoracolumbar spine.  IMPRESSION:  Status post right lower lobectomy.  No evidence of recurrent or metastatic disease in the chest.  Extensive centrilobular emphysematous changes   Assessment & Plan:   COPD CT scan and failure to benefit significantly from BD's suggest an emphysema phenotype - his emphysema on CT scan 1213 is certainly worse. He clearly desaturates w exertion on 2L/min.  - will restart  BD's even though he hasn't benefited in the past >> Breo + tudorza - titrate up O2 - OV 6 weeks  PNEUMONITIS I don't see any evidence recurrence. 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 CT scan 12/13 reassuring, CXR today reassuring  Secondary pulmonary hypertension Severe based on recent TTE. Agree with Dr Excell Seltzer that this may need to be treated at some point. I will hold off on the r heart cath for now because I feel that his emphysema and hypoxemia are under-treated. Suspect we will decide to perform R heart cath at some point in the future.

## 2012-03-18 NOTE — Assessment & Plan Note (Signed)
CT scan and failure to benefit significantly from BD's suggest an emphysema phenotype - his emphysema on CT scan 1213 is certainly worse. He clearly desaturates w exertion on 2L/min.  - will restart BD's even though he hasn't benefited in the past >> Breo + tudorza - titrate up O2 - OV 6 weeks

## 2012-03-18 NOTE — Assessment & Plan Note (Signed)
Severe based on recent TTE. Agree with Dr Excell Seltzer that this may need to be treated at some point. I will hold off on the r heart cath for now because I feel that his emphysema and hypoxemia are under-treated. Suspect we will decide to perform R heart cath at some point in the future.

## 2012-03-18 NOTE — Assessment & Plan Note (Signed)
I don't see any evidence recurrence. 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 CT scan 12/13 reassuring, CXR today reassuring

## 2012-03-23 ENCOUNTER — Ambulatory Visit (INDEPENDENT_AMBULATORY_CARE_PROVIDER_SITE_OTHER): Payer: Medicare Other

## 2012-03-23 DIAGNOSIS — I359 Nonrheumatic aortic valve disorder, unspecified: Secondary | ICD-10-CM

## 2012-03-23 DIAGNOSIS — Z7901 Long term (current) use of anticoagulants: Secondary | ICD-10-CM

## 2012-03-23 DIAGNOSIS — Z954 Presence of other heart-valve replacement: Secondary | ICD-10-CM

## 2012-03-23 DIAGNOSIS — I4891 Unspecified atrial fibrillation: Secondary | ICD-10-CM

## 2012-03-23 LAB — PROTIME-INR
INR: 6.9 ratio (ref 0.8–1.0)
Prothrombin Time: 70.3 s (ref 10.2–12.4)

## 2012-03-29 ENCOUNTER — Ambulatory Visit (INDEPENDENT_AMBULATORY_CARE_PROVIDER_SITE_OTHER): Payer: Medicare Other

## 2012-03-29 DIAGNOSIS — Z954 Presence of other heart-valve replacement: Secondary | ICD-10-CM

## 2012-03-29 DIAGNOSIS — I4891 Unspecified atrial fibrillation: Secondary | ICD-10-CM

## 2012-03-29 DIAGNOSIS — Z7901 Long term (current) use of anticoagulants: Secondary | ICD-10-CM

## 2012-03-29 DIAGNOSIS — I359 Nonrheumatic aortic valve disorder, unspecified: Secondary | ICD-10-CM

## 2012-03-29 LAB — POCT INR: INR: 2.2

## 2012-04-05 ENCOUNTER — Telehealth: Payer: Self-pay | Admitting: Emergency Medicine

## 2012-04-05 NOTE — Telephone Encounter (Signed)
Member ID # unknown. Pt has tried Symbicort in the past. [facility] APPROVED indefinitely, as long as, the pt keeps the same insurance plan. Pharmacy notified.

## 2012-04-12 ENCOUNTER — Ambulatory Visit (INDEPENDENT_AMBULATORY_CARE_PROVIDER_SITE_OTHER): Payer: Medicare Other

## 2012-04-12 DIAGNOSIS — I359 Nonrheumatic aortic valve disorder, unspecified: Secondary | ICD-10-CM

## 2012-04-12 DIAGNOSIS — Z954 Presence of other heart-valve replacement: Secondary | ICD-10-CM

## 2012-04-12 DIAGNOSIS — Z7901 Long term (current) use of anticoagulants: Secondary | ICD-10-CM

## 2012-04-12 DIAGNOSIS — I4891 Unspecified atrial fibrillation: Secondary | ICD-10-CM

## 2012-04-12 LAB — POCT INR: INR: 3.5

## 2012-04-28 ENCOUNTER — Encounter: Payer: Self-pay | Admitting: Internal Medicine

## 2012-04-28 ENCOUNTER — Ambulatory Visit (INDEPENDENT_AMBULATORY_CARE_PROVIDER_SITE_OTHER): Payer: Medicare Other | Admitting: Internal Medicine

## 2012-04-28 VITALS — BP 144/70 | HR 63 | Temp 97.2°F | Resp 16 | Ht 72.0 in | Wt 179.0 lb

## 2012-04-28 DIAGNOSIS — J449 Chronic obstructive pulmonary disease, unspecified: Secondary | ICD-10-CM

## 2012-04-28 DIAGNOSIS — R21 Rash and other nonspecific skin eruption: Secondary | ICD-10-CM

## 2012-04-28 DIAGNOSIS — K429 Umbilical hernia without obstruction or gangrene: Secondary | ICD-10-CM

## 2012-04-28 DIAGNOSIS — L299 Pruritus, unspecified: Secondary | ICD-10-CM

## 2012-04-28 DIAGNOSIS — B351 Tinea unguium: Secondary | ICD-10-CM

## 2012-04-28 DIAGNOSIS — J310 Chronic rhinitis: Secondary | ICD-10-CM

## 2012-04-28 DIAGNOSIS — I10 Essential (primary) hypertension: Secondary | ICD-10-CM

## 2012-04-28 DIAGNOSIS — I4891 Unspecified atrial fibrillation: Secondary | ICD-10-CM

## 2012-04-28 MED ORDER — AMMONIUM LACTATE 12 % EX LOTN: TOPICAL_LOTION | CUTANEOUS | Status: DC | PRN

## 2012-04-28 MED ORDER — TERBINAFINE HCL 250 MG PO TABS: 250.0000 mg | ORAL_TABLET | Freq: Every day | ORAL | Status: DC

## 2012-04-28 NOTE — Progress Notes (Signed)
Subjective:    Patient ID: John [name], male    DOB: 07-19-1933, 79 y.o.   MRN: [id number]  HPI John [name] has several issues today. 1. Nail fungus - increasing thickness to the nail which is getting uncomfortable. Will to try medical therapy - lamisil is safe to use with warfarin.  2. Flaky skin on the shins- he is very sensitive to water with increased itching and dryness. May be a form of icthyosis. - LacHydrin 5 applied daily. If no results - derm consult  3. Generalized pruritis - truncal and back. He has had this in the past. He does associate the onset of itching with the start of [place] and Breo. If itching doesn't resolve can use claritin 10 mg twice a day and Zantac 150 mg twice a day for the itching.   4. Numbness of the 5th digit left hand. Decrease light touch, pin prick and deep vibration. Tingling with tap against ulnar grove at the elbow. He does rest his arm across this area.   5. Rhinits - chronic runny nose and decrease taste.   Past Medical History  Diagnosis Date  . Atrial fibrillation   . Aortic valve disorders   . Other primary cardiomyopathies   . Edema     CHRONIC LOWER EXTREMITY  . Unspecified essential hypertension   . Other and unspecified hyperlipidemia   . Heart valve replaced by other means   . Other malaise and fatigue   . Coronary atherosclerosis of native coronary artery   . Acute myocardial infarction, subendocardial infarction, subsequent episode of care   . Unspecified adverse effect of unspecified drug, medicinal and biological substance   . Umbilical hernia without mention of obstruction or gangrene   . Sebaceous cyst   . Labyrinthitis, unspecified   . Personal history of other diseases of digestive system   . Special screening for malignant neoplasm of prostate   . BC (bronchogenic carcinoma)     S/P LOBECTOMY 2011   Past Surgical History  Procedure Laterality Date  . Aortic valve replacement  1994    [facility]  . Appendectomy   [facility]  . Abdominal hernia repair    . Lobectomy  2011   Family History  Problem Relation Age of Onset  . Colon cancer Father   . Aneurysm Mother     RUPTURE-NECK   . Coronary artery disease Mother    History   Social History  . Marital Status: Married    Spouse Name: N/A    Number of Children: 1  . Years of Education: N/A   Occupational History  . [employer]     PHOTOGRAPHER   Social History Main Topics  . Smoking status: Former Smoker -- 1.00 packs/day for 40 years    Types: Cigarettes    Quit date: 02/10/2009  . Smokeless tobacco: Never Used  . Alcohol Use: No  . Drug Use: No  . Sexually Active: Not on file   Other Topics Concern  . Not on file   Social History Narrative   HSG, 2 years college. married '70 . 1 daughter  '70. work: Environmental manager, retired.   ACP - no living will - provided information at today's visit.     Current Outpatient Prescriptions on File Prior to Visit  Medication Sig Dispense Refill  . acetaminophen (TYLENOL) 500 MG tablet Take 500 mg by mouth every 6 (six) hours as needed.        . Aclidinium Bromide (TUDORZA PRESSAIR) 400 MCG/ACT AEPB  Inhale 1 packet into the lungs 2 (two) times daily.  1 each  11  . albuterol (VENTOLIN HFA) 108 (90 BASE) MCG/ACT inhaler Inhale 2 puffs into the lungs every 4 (four) hours as needed.  1 Inhaler  5  . aspirin 81 MG tablet Take 81 mg by mouth daily.        . bisoprolol (ZEBETA) 5 MG tablet TAKE 1 TABLET BY MOUTH TWICE DAILY.  60 tablet  9  . famotidine (PEPCID) 20 MG tablet Take 20 mg by mouth daily as needed.        . fluticasone (FLONASE) 50 MCG/ACT nasal spray Place 2 sprays into the nose daily as needed.  16 g  5  . Fluticasone Furoate-Vilanterol (BREO ELLIPTA) 100-25 MCG/INH AEPB Inhale 1 packet into the lungs daily.  60 each  6  . furosemide (LASIX) 40 MG tablet Take 1 tablet by mouth twice daily  60 tablet  9  . guaiFENesin (MUCINEX) 600 MG 12 hr tablet Take 2 tablets (1,200 mg total) by mouth as needed.   120 suppository  11  . ipratropium (ATROVENT) 0.03 % nasal spray Place 2 sprays into the nose every 12 (twelve) hours.  30 mL  12  . lisinopril (PRINIVIL,ZESTRIL) 2.5 MG tablet TAKE 1 TABLET BY MOUTH EVERY DAY  30 tablet  6  . NON FORMULARY 2 L. Oxygen continuous       . potassium chloride SA (K-DUR,KLOR-CON) 20 MEQ tablet TAKE 1 TABLET ONCE DAILY  30 tablet  6  . sodium chloride (OCEAN) 0.65 % nasal spray 1 spray by Nasal route as needed.        . warfarin (COUMADIN) 2.5 MG tablet Take as directed by anticoagulation clinic  30 tablet  3  . warfarin (COUMADIN) 5 MG tablet Take as directed by [facility].  30 tablet  3   No current facility-administered medications on file prior to visit.      Review of Systems System review is negative for any constitutional, cardiac, pulmonary, GI or neuro symptoms or complaints other than as described in the HPI.     Objective:   Physical Exam Filed Vitals:   04/28/12 1028  BP: 144/70  Pulse: 63  Temp: 97.2 F (36.2 C)  Resp: 16   Gen'l- Elderly white man in acute distress HEENT- C&S clear Cor - RRR Pulm - decreased breath sounds. No rales or wheezes. Abd - large umbilical hernia noted Ext - no deformity Derm - many dark/black lesions c/w keratosis, posterior cervical cyst, very fungal thickened toenails, dry scaly skin distal lower extremity -much worse on the left Neuro - A&O x 3, normal gait and station.        Assessment & Plan:   1. Nail fungus - increasing thickness to the nail which is getting uncomfortable. Will to try medical therapy - lamisil is safe to use with warfarin. Plan  lamisil 250 mg once a day for 90 days  2. Flaky skin on the shins- he is very sensitive to water with increased itching and dryness. May be a form of icthyosis. - LacHydrin 5 applied twice a day. If no results - derm consult  3. Generalized pruritis - truncal and back. He has had this in the past. He does associate the onset of itching with  the start of [place] and Breo. If itching doesn't resolve can use claritin 10 mg twice a day and Zantac 150 mg twice a day for the itching.   4.  Numbness of the 5th digit left hand. Decrease light touch, pin prick and deep vibration. Tingling with tap against ulnar grove at the elbow. He does rest his arm across this area. Plan - try to not rest the arm/elbow on the sofa or table such that there is pressure on the inner elbow/ulnar grove.   5. Rhinits - chronic runny nose and decrease taste. If it is the oxygen you may need to humidify the oxygen. You may want to retry the  flonase nasal spray.,  6. Lungs - decreased breath sounds are consistent with emphysematous changes as well as volume loss after surgery.

## 2012-04-28 NOTE — Patient Instructions (Addendum)
1. Nail fungus - increasing thickness to the nail which is getting uncomfortable. Will to try medical therapy - lamisil is safe to use with warfarin. Plan  lamisil 250 mg once a day for 90 days  2. Flaky skin on the shins- he is very sensitive to water with increased itching and dryness. May be a form of icthyosis. - LacHydrin 5 applied twice a day. If no results - derm consult  3. Generalized pruritis - truncal and back. He has had this in the past. He does associate the onset of itching with the start of [place] and Breo. If itching doesn't resolve can use claritin 10 mg twice a day and Zantac 150 mg twice a day for the itching.   4. Numbness of the 5th digit left hand. Decrease light touch, pin prick and deep vibration. Tingling with tap against ulnar grove at the elbow. He does rest his arm across this area. Plan - try to not rest the arm/elbow on the sofa or table such that there is pressure on the inner elbow/ulnar grove.   5. Rhinits - chronic runny nose and decrease taste. If it is the oxygen you may need to humidify the oxygen. You may want to retry the  flonase nasal spray.,  6. Lungs - decreased breath sounds are consistent with emphysematous changes as well as volume loss after surgery.   Come see me a little more often so we can address these problems and more in a thorough fashion.

## 2012-04-29 ENCOUNTER — Ambulatory Visit (INDEPENDENT_AMBULATORY_CARE_PROVIDER_SITE_OTHER): Payer: Medicare Other

## 2012-04-29 ENCOUNTER — Ambulatory Visit (INDEPENDENT_AMBULATORY_CARE_PROVIDER_SITE_OTHER): Payer: Medicare Other | Admitting: Emergency Medicine

## 2012-04-29 ENCOUNTER — Encounter: Payer: Self-pay | Admitting: Emergency Medicine

## 2012-04-29 VITALS — BP 130/70 | HR 67 | Temp 96.8°F | Ht 72.0 in | Wt 181.4 lb

## 2012-04-29 DIAGNOSIS — J449 Chronic obstructive pulmonary disease, unspecified: Secondary | ICD-10-CM

## 2012-04-29 DIAGNOSIS — I359 Nonrheumatic aortic valve disorder, unspecified: Secondary | ICD-10-CM

## 2012-04-29 DIAGNOSIS — Z954 Presence of other heart-valve replacement: Secondary | ICD-10-CM

## 2012-04-29 DIAGNOSIS — Z7901 Long term (current) use of anticoagulants: Secondary | ICD-10-CM

## 2012-04-29 DIAGNOSIS — I4891 Unspecified atrial fibrillation: Secondary | ICD-10-CM

## 2012-04-29 LAB — POCT INR: INR: 3.2

## 2012-04-29 NOTE — Assessment & Plan Note (Signed)
Patient is skeptical about his diagnosis!! He will be seeing Dr. Delton Coombes 3/20/'14 and plans to discuss his inhalational meds and his associated complaints. He also c/o cough that he relates to his oxygen. He did appear stable at today's exam.

## 2012-04-29 NOTE — Assessment & Plan Note (Signed)
Suspect the itching is due to the [place] (similar sx with Spiriva). Could be due to either med (or something else altogether)  Please stop Carlos American now. If after a week you are still having itching, then stop Breo also.  Continue your oxygen and other medications.  Follow with Dr Delton Coombes in 1 month

## 2012-04-29 NOTE — Assessment & Plan Note (Signed)
BP Readings from Last 3 Encounters:  04/28/12 144/70  03/18/12 104/60  03/12/12 140/72   Adequate control based on JNC 8 guidelines for an elderly man  Plan Continue present medications

## 2012-04-29 NOTE — Assessment & Plan Note (Signed)
Large hernia but he denies any pain or tenderness. He was going to have surgical repair but was not cleared by cardiology.  Plan Watchful waiting

## 2012-04-29 NOTE — Patient Instructions (Addendum)
Please stop Carlos American now. If after a week you are still having itching, then stop Breo also.  Continue your oxygen and other medications.  Follow with Dr Delton Coombes in 1 month

## 2012-04-29 NOTE — Progress Notes (Signed)
Subjective:    Patient ID: John [name], male    DOB: 04-21-1933, 79 y.o.   MRN: [id number] HPI 79 yo complicated pt w hx COPD, NCSLCA s/p resection. Parox A Fib that has been assoc with vol overload and dyspnea. Also w pulm infilgtrates, ? 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 Has been in and out of hosp in Feb 2012 due to exacerbations, RVR, etc. Last d/c was 2/20. Feels that his HR is better controlled on Tikosyn.   Last visit we made plan to wean Pred to off (COP vs Amio tox). He feels that his breathing is stable, has been off the pred x 3 days. He is complaining of severe gluteal pain bilaterally radiating to each thigh, started about 3 weeks ago. Of note he started Pravachol about 3 weeks ago.   ROV 77/30/12 -- COPD, NSCLCA w resection, A Fib, ? 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 He follows up his dyspnea, gluteal myalgias. He has been off the prednisone for over a month, seems to be holding his own without the Pred. The myalgias are better off the pravachol, decided w Dr Excell Seltzer not to try an alternative.   ROV 77/30/12 -- follows up for COPD, restriction from lung resection. 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 He has been doing better since last visit a month ago. He stopped Symbicort as we had planned, doesn't seem to miss it. He is taking the Spiriva. Rare Albuterol use. May be having some increased cough and allergies.   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 Remains on Spiriva, has been off Symbicort for several months. Had CT scan 8/8 to follow NSCLCA - R effusion is smaller, small basilar nodule that will need follow up. He has been having problems with sneezing and coughing for the last several months. Clear nasal drainage. Has been taken off tykosyn since last visit. Currently in NSR.   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 Last CT chest was 09/18/10 for R basilar nodule. Needs to requalify for o2. Last time we initiated trial off Spiriva. He doesn't believe  he missed it. Never uses or needs the albuterol. He remains active, cleaned the gutters this week w O2 on!.   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 CT scan repeated in 03/2011, stable compared with priors. Now off of BD's and seems to be tolerating well. He continues to do well, has been able to stay active. He often has been able to go without O2 at rest. 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 CT scan repeated in 03/2011, stable compared with priors. Now off of BD's and seems to be tolerating well. He has stable DOE - never thought BD's helped him. Having nasal drainage on  guaifenesin. Stopped fluticasone spray. He is using albuterol prn - rarely uses. Still on lisinopril,  very rare cough.   Acute OV 02/26/12 --  Complains of  dry cough x 1 day,alot at night,wife on Tamiflu. sob-same right now, weakness, no wheezing, no cp or tightness,temp 102 now with xtra strength  Tylenol.  Wife began with URI symptoms , seen by PCP this week , dx w/ flu (no swab)  , rx Tamiflu Yesterday he began with dry cough and fever -tmax 102. Body aches today .  No wheezing or increased dypsnea.  No  hemoptyiss. Appetite is fair. No n/v.  No edema.   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 CT scan repeated in 03/2011, stable compared with priors. Had been on BD's (he never believed he benefited) and tolerating as of last visit 10/13, but since then has had problems >> treated for an AE in January as above by TP. Also followed with Dr Excell Seltzer >> TTE from 12/13 with estimated peak PAP 81 mmHg (was 63 in 10/2009, normal in 11/09). His CT scan 11/13 without significant ILD, but severe emphysematous changes. CXR today with chronic R vol loss and effusion (post-lobectomy). He feels better now from the acute exacerbation. He knows  that he desaturates w exertion >> to 85% with changing clothes or showering.   ROV 77/20/14 -- COPD, NSCLCA s/p resection, hx COP, multifactorial secondary PAH, hypoxemia. Last time, given his significant emphysema on Ct scan and his FEV1, decide to retry BD's >> started [place] + Breo. He still has SOB with changing clothes, etc. Wearing 2L/min, up to 4L/min w most exertion. He has been having itching all over (no rash)  Since starting the BD's. Hasn't really tested his exertional tolerance.    CAT Score 77/20/2014 03/18/2012  Total CAT Score 8 11       Objective:   Physical Exam Filed Vitals:   04/29/12 1338  BP: 130/70  Pulse: 67  Temp: 96.8 F (36 C)    Gen: Pleasant, chronically ill appearing in wheelchair  normal affect, comfortable on O2  ENT: No lesions,  mouth clear,  oropharynx clear, no postnasal drip  Neck: No JVD, no TMG, no carotid bruits  Lungs: No use of accessory muscles, no dullness to percussion, decreased BS in bases , no wheezing or crackles .   Cardiovascular: RRR, heart sounds normal, no murmur or gallops, trace peripheral edema  Musculoskeletal: No deformities, no cyanosis or clubbing  Neuro: alert, non focal  Skin: Warm, no lesions or rashes    12/25/11 --  Comparison: 02/26/2013and 01/23/2010  Findings: Status post right lower lobectomy. Extensive  centrilobular emphysematous changes. Bullous changes at the right  lung base.  6 mm left lower lobe nodule along the major fissure (series 3/image  29), unchanged from 2011, benign. Possible 5 mm nodule at the  right lung base (series 3/image 42), unchanged.  Pleural thickening/fluid at the right lung base, unchanged. No  pneumothorax.  Visualized thyroid is unremarkable.  Mild cardiomegaly. No pericardial effusion. Coronary  atherosclerosis. Atherosclerotic calcifications of the aortic  arch. Status post aortic valve replacement.  Visualized upper abdomen is notable for cholelithiasis and a 2.1 cm   right upper pole cyst.  Degenerative changes of the visualized thoracolumbar spine.  IMPRESSION:  Status post right lower lobectomy.  No evidence of recurrent or metastatic disease in the chest.  Extensive centrilobular emphysematous changes   Assessment & Plan:   COPD Suspect the itching is due to the [place] (similar sx with Spiriva). Could be due to either med (or something else altogether)  Please stop Carlos American now. If after a week you are still having itching, then stop Breo also.  Continue your oxygen and other medications.  Follow with Dr Delton Coombes in 1 month

## 2012-04-29 NOTE — Assessment & Plan Note (Signed)
Stable and rate controlled.

## 2012-05-05 ENCOUNTER — Telehealth: Payer: Self-pay | Admitting: Cardiovascular Disease

## 2012-05-05 NOTE — Telephone Encounter (Signed)
New problem   Pt has question about a new medication that he thinks may change his coumadin levels

## 2012-05-05 NOTE — Telephone Encounter (Signed)
Called spoke with pt, pt reports having been started on Bactrim DS yesterday 05/04/12 BID x 5 days.  Advised pt this antibiotic can interact with Coumadin and increase his INR.  Moved scheduled appt for INR recheck from 05/13/12 to 05/07/12.  Pt aware of appt change date and time.

## 2012-05-07 ENCOUNTER — Ambulatory Visit (INDEPENDENT_AMBULATORY_CARE_PROVIDER_SITE_OTHER): Payer: Medicare Other

## 2012-05-07 DIAGNOSIS — I4891 Unspecified atrial fibrillation: Secondary | ICD-10-CM

## 2012-05-07 DIAGNOSIS — Z7901 Long term (current) use of anticoagulants: Secondary | ICD-10-CM

## 2012-05-07 DIAGNOSIS — Z954 Presence of other heart-valve replacement: Secondary | ICD-10-CM

## 2012-05-07 DIAGNOSIS — I359 Nonrheumatic aortic valve disorder, unspecified: Secondary | ICD-10-CM

## 2012-05-21 ENCOUNTER — Ambulatory Visit (INDEPENDENT_AMBULATORY_CARE_PROVIDER_SITE_OTHER): Payer: Medicare Other | Admitting: *Deleted

## 2012-05-21 DIAGNOSIS — Z954 Presence of other heart-valve replacement: Secondary | ICD-10-CM

## 2012-05-21 DIAGNOSIS — I4891 Unspecified atrial fibrillation: Secondary | ICD-10-CM

## 2012-05-21 DIAGNOSIS — Z7901 Long term (current) use of anticoagulants: Secondary | ICD-10-CM

## 2012-05-21 DIAGNOSIS — I359 Nonrheumatic aortic valve disorder, unspecified: Secondary | ICD-10-CM

## 2012-06-02 ENCOUNTER — Ambulatory Visit (INDEPENDENT_AMBULATORY_CARE_PROVIDER_SITE_OTHER): Payer: Medicare Other | Admitting: Emergency Medicine

## 2012-06-02 ENCOUNTER — Encounter: Payer: Self-pay | Admitting: Emergency Medicine

## 2012-06-02 VITALS — BP 122/70 | HR 66 | Temp 97.8°F | Ht 72.0 in | Wt 180.2 lb

## 2012-06-02 DIAGNOSIS — J449 Chronic obstructive pulmonary disease, unspecified: Secondary | ICD-10-CM

## 2012-06-02 NOTE — Patient Instructions (Addendum)
Continue your Breo daily Use albuterol as needed Wear your oxygen at 2L/min at rest, 4L/min with exertion.  Follow with Dr Delton Coombes in 3 months or sooner if you have any problems.

## 2012-06-02 NOTE — Progress Notes (Signed)
Subjective:    Patient ID: John [name], male    DOB: 1933/11/21, 79 y.o.   MRN: [id number] HPI 79 yo complicated pt w hx COPD, NCSLCA s/p resection. Parox A Fib that has been assoc with vol overload and dyspnea. Also w pulm infilgtrates, ? 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 Has been in and out of hosp in Feb 2012 due to exacerbations, RVR, etc. Last d/c was 2/20. Feels that his HR is better controlled on Tikosyn.   Last visit we made plan to wean Pred to off (COP vs Amio tox). He feels that his breathing is stable, has been off the pred x 3 days. He is complaining of severe gluteal pain bilaterally radiating to each thigh, started about 3 weeks ago. Of note he started Pravachol about 3 weeks ago.   ROV 06/10/10 -- COPD, NSCLCA w resection, A Fib, ? 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 He follows up his dyspnea, gluteal myalgias. He has been off the prednisone for over a month, seems to be holding his own without the Pred. The myalgias are better off the pravachol, decided w Dr Excell Seltzer not to try an alternative.   ROV 07/10/10 -- follows up for COPD, restriction from lung resection. 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 He has been doing better since last visit a month ago. He stopped Symbicort as we had planned, doesn't seem to miss it. He is taking the Spiriva. Rare Albuterol use. May be having some increased cough and allergies.   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 Remains on Spiriva, has been off Symbicort for several months. Had CT scan 8/8 to follow NSCLCA - R effusion is smaller, small basilar nodule that will need follow up. He has been having problems with sneezing and coughing for the last several months. Clear nasal drainage. Has been taken off tykosyn since last visit. Currently in NSR.   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 Last CT chest was 09/18/10 for R basilar nodule. Needs to requalify for o2. Last time we initiated trial off Spiriva. He doesn't believe  he missed it. Never uses or needs the albuterol. He remains active, cleaned the gutters this week w O2 on!.   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 CT scan repeated in 03/2011, stable compared with priors. Now off of BD's and seems to be tolerating well. He continues to do well, has been able to stay active. He often has been able to go without O2 at rest. 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 CT scan repeated in 03/2011, stable compared with priors. Now off of BD's and seems to be tolerating well. He has stable DOE - never thought BD's helped him. Having nasal drainage on  guaifenesin. Stopped fluticasone spray. He is using albuterol prn - rarely uses. Still on lisinopril,  very rare cough.   Acute OV 02/26/12 --  Complains of  dry cough x 1 day,alot at night,wife on Tamiflu. sob-same right now, weakness, no wheezing, no cp or tightness,temp 102 now with xtra strength  Tylenol.  Wife began with URI symptoms , seen by PCP this week , dx w/ flu (no swab)  , rx Tamiflu Yesterday he began with dry cough and fever -tmax 102. Body aches today .  No wheezing or increased dypsnea.  No  hemoptyiss. Appetite is fair. No n/v.  No edema.   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 CT scan repeated in 03/2011, stable compared with priors. Had been on BD's (he never believed he benefited) and tolerating as of last visit 10/13, but since then has had problems >> treated for an AE in January as above by TP. Also followed with Dr Excell Seltzer >> TTE from 12/13 with estimated peak PAP 81 mmHg (was 63 in 10/2009, normal in 11/09). His CT scan 11/13 without significant ILD, but severe emphysematous changes. CXR today with chronic R vol loss and effusion (post-lobectomy). He feels better now from the acute exacerbation. He knows  that he desaturates w exertion >> to 85% with changing clothes or showering.   ROV 04/29/12 -- COPD, NSCLCA s/p resection, hx COP, multifactorial secondary PAH, hypoxemia. Last time, given his significant emphysema on Ct scan and his FEV1, decide to retry BD's >> started [place] + Breo. He still has SOB with changing clothes, etc. Wearing 2L/min, up to 4L/min w most exertion. He has been having itching all over (no rash)  Since starting the BD's. Hasn't really tested his exertional tolerance.   ROV 06/02/12 -- COPD, NSCLCA s/p resection, hx COP, multifactorial secondary PAH, hypoxemia. Given his significant emphysema on Ct scan and his FEV1, decided to retry BD's in 3/'14 >> started [place] + Breo. We stopped the [place] last visit after he developed itching. He stayed on [place], is still unclear whether the [place] helps. He is limited at baseline, cannot exercise. He is having frequent aspiration sx - "gets choked".    CAT Score 04/29/2012 03/18/2012  Total CAT Score 8 11       Objective:   Physical Exam Filed Vitals:   06/02/12 1328  BP: 122/70  Pulse: 66  Temp: 97.8 F (36.6 C)    Gen: Pleasant, chronically ill appearing in wheelchair  normal affect, comfortable on O2  ENT: No lesions,  mouth clear,  oropharynx clear, no postnasal drip  Neck: No JVD, no TMG, no carotid bruits  Lungs: No use of accessory muscles, no dullness to percussion, decreased BS in bases , no wheezing or crackles .   Cardiovascular: RRR, heart sounds normal, no murmur or gallops, trace peripheral edema  Musculoskeletal: No deformities, no cyanosis or clubbing  Neuro: alert, non focal  Skin: Warm, no lesions or rashes    12/25/11 --  Comparison: 02/26/2013and 01/23/2010  Findings: Status post right lower lobectomy. Extensive  centrilobular emphysematous changes. Bullous changes at the right  lung base.  6 mm left lower lobe nodule along the major fissure (series 3/image  29), unchanged from 2011, benign.  Possible 5 mm nodule at the  right lung base (series 3/image 42), unchanged.  Pleural thickening/fluid at the right lung base, unchanged. No  pneumothorax.  Visualized thyroid is unremarkable.  Mild cardiomegaly. No pericardial effusion. Coronary  atherosclerosis. Atherosclerotic calcifications of the aortic  arch. Status post aortic valve replacement.  Visualized upper abdomen is notable for cholelithiasis and a 2.1 cm  right upper pole cyst.  Degenerative changes of the visualized thoracolumbar spine.  IMPRESSION:  Status post right lower lobectomy.  No evidence of recurrent or metastatic disease in the chest.  Extensive centrilobular emphysematous changes   Assessment & Plan:   COPD - continue Breo + SABA prn - need to watch for aspiration, consider checking MBS or swallow eval if  he continues to have sx.  - cannot tolerate spiriva or tudorza - could consider chronic pred in the future.  - continue to push exercise and conditioning.  - rov 3 months

## 2012-06-02 NOTE — Assessment & Plan Note (Signed)
-   continue Breo + SABA prn - need to watch for aspiration, consider checking MBS or swallow eval if he continues to have sx.  - cannot tolerate spiriva or tudorza - could consider chronic pred in the future.  - continue to push exercise and conditioning.  - rov 3 months

## 2012-06-11 ENCOUNTER — Ambulatory Visit (INDEPENDENT_AMBULATORY_CARE_PROVIDER_SITE_OTHER): Payer: Medicare Other | Admitting: Pharmacist

## 2012-06-11 DIAGNOSIS — I359 Nonrheumatic aortic valve disorder, unspecified: Secondary | ICD-10-CM

## 2012-06-11 DIAGNOSIS — I4891 Unspecified atrial fibrillation: Secondary | ICD-10-CM

## 2012-06-11 DIAGNOSIS — Z7901 Long term (current) use of anticoagulants: Secondary | ICD-10-CM

## 2012-06-11 DIAGNOSIS — Z954 Presence of other heart-valve replacement: Secondary | ICD-10-CM

## 2012-07-01 ENCOUNTER — Ambulatory Visit (INDEPENDENT_AMBULATORY_CARE_PROVIDER_SITE_OTHER): Payer: Medicare Other

## 2012-07-01 DIAGNOSIS — I359 Nonrheumatic aortic valve disorder, unspecified: Secondary | ICD-10-CM

## 2012-07-01 DIAGNOSIS — J309 Allergic rhinitis, unspecified: Secondary | ICD-10-CM

## 2012-07-01 DIAGNOSIS — I4891 Unspecified atrial fibrillation: Secondary | ICD-10-CM

## 2012-07-01 DIAGNOSIS — Z954 Presence of other heart-valve replacement: Secondary | ICD-10-CM

## 2012-07-01 DIAGNOSIS — Z7901 Long term (current) use of anticoagulants: Secondary | ICD-10-CM

## 2012-07-01 LAB — POCT INR: INR: 2

## 2012-07-01 MED ORDER — ALBUTEROL SULFATE HFA 108 (90 BASE) MCG/ACT IN AERS: 2.0000 | INHALATION_SPRAY | RESPIRATORY_TRACT | Status: DC | PRN

## 2012-07-06 ENCOUNTER — Other Ambulatory Visit: Payer: Self-pay | Admitting: Emergency Medicine

## 2012-07-06 DIAGNOSIS — J309 Allergic rhinitis, unspecified: Secondary | ICD-10-CM

## 2012-07-06 MED ORDER — ALBUTEROL SULFATE HFA 108 (90 BASE) MCG/ACT IN AERS: 2.0000 | INHALATION_SPRAY | RESPIRATORY_TRACT | Status: DC | PRN

## 2012-07-08 IMAGING — CT CT CHEST W/ CM
3 of 5 series · 16 of 36 positions shown, 18 images · IV contrast (agent unspecified)
Comparison: Chest CT 05/29/2009 performed [facility].  PET
CT 06/11/2009.

CLINICAL DATA: Evaluated compare pulmonary nodule right lower lobe.
Findings highly suspicious for malignancy.  Priors were performed
at [facility].

CT CHEST WITH CONTRAST
TECHNIQUE: Multidetector CT imaging of the chest was performed
following the standard protocol during bolus administration of
intravenous contrast.
Contrast: 80 ml Smnipaque-IQQ

[Series 2: hires chest w 5.0 · axial · 0.79mm/px · z∈[-302,-57]mm · 8 of 65 slices shown, 10 images]
[im 8/65  mediastinal]
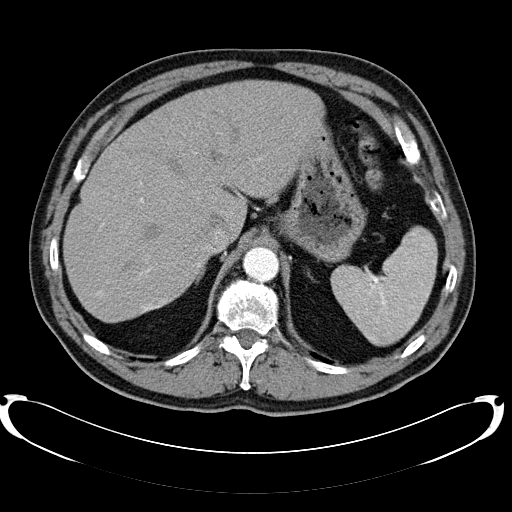
[im 8/65  lung]
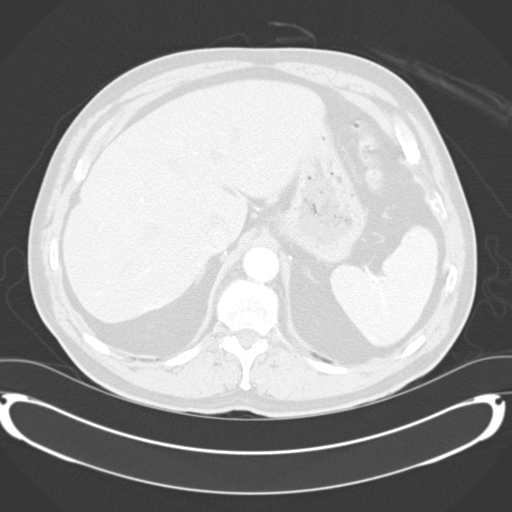
[im 15/65  lung]
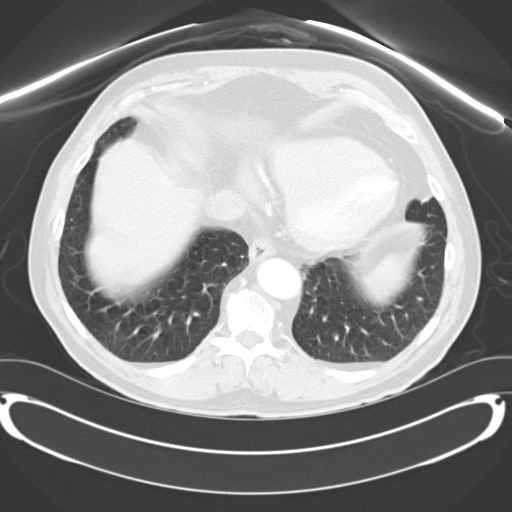
[im 22/65  lung]
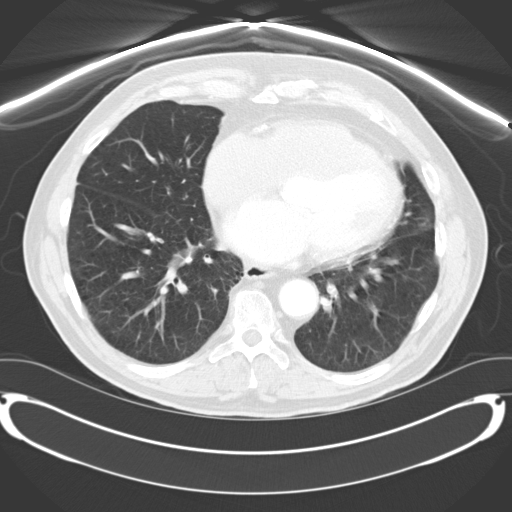
[im 29/65  lung]
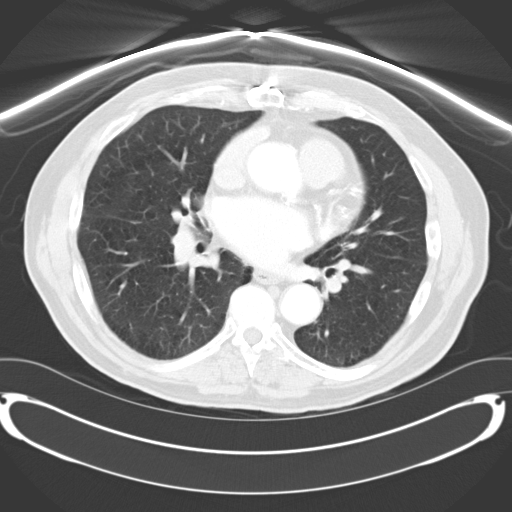
[im 36/65  mediastinal]
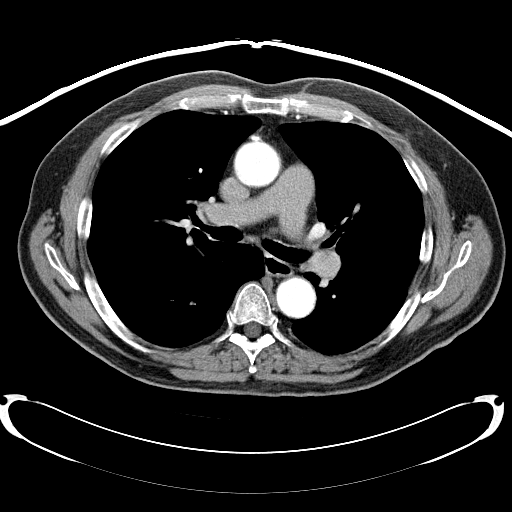
[im 36/65  lung]
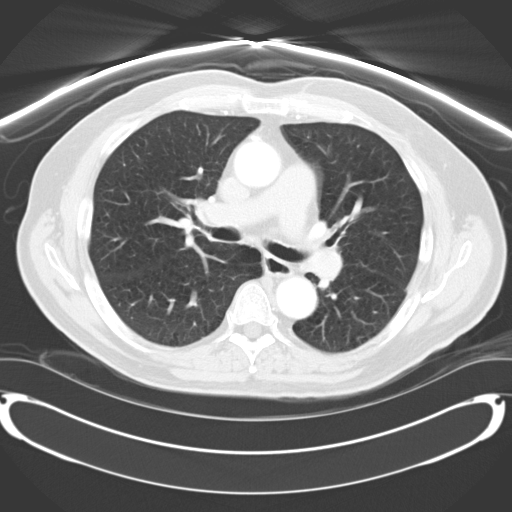
[im 43/65  lung]
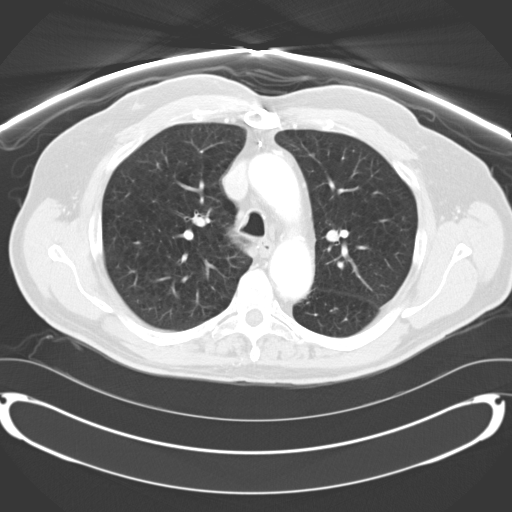
[im 50/65  lung]
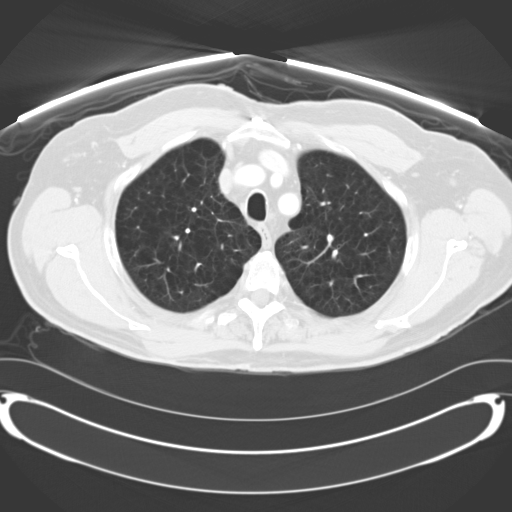
[im 57/65  lung]
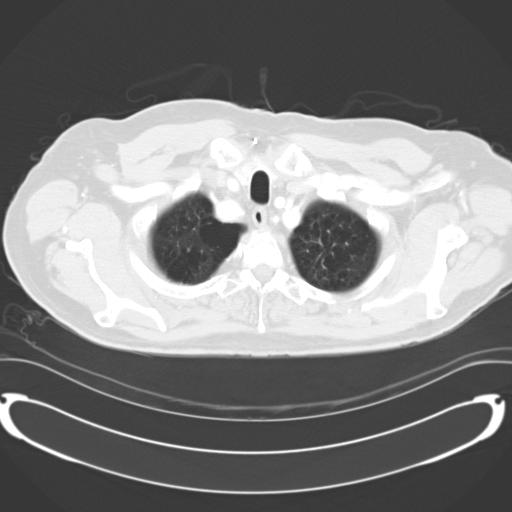

[Series 3: lung · axial · 0.79mm/px · z∈[-292,-137]mm · 5 of 63 slices shown]
[im 8/63  lung]
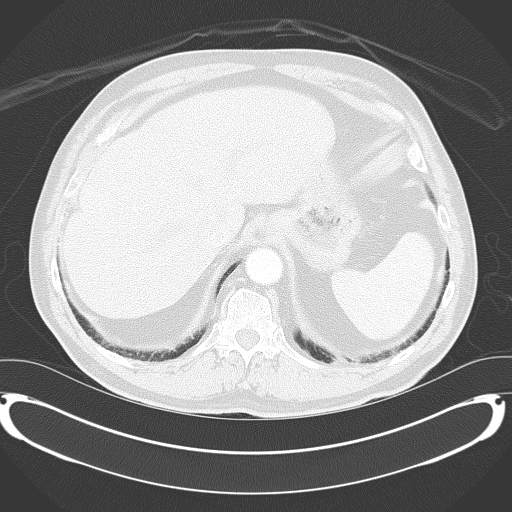
[im 16/63  lung]
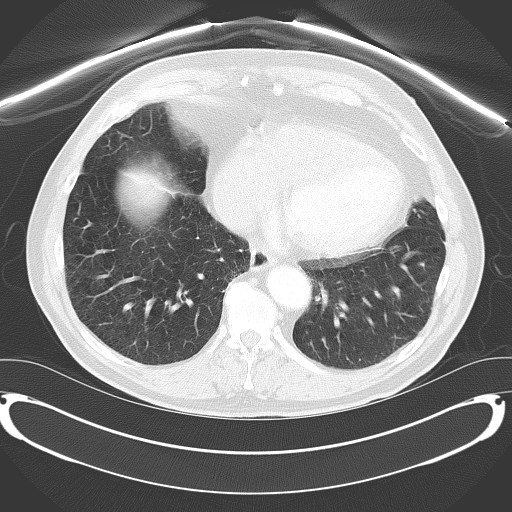
[im 24/63  lung]
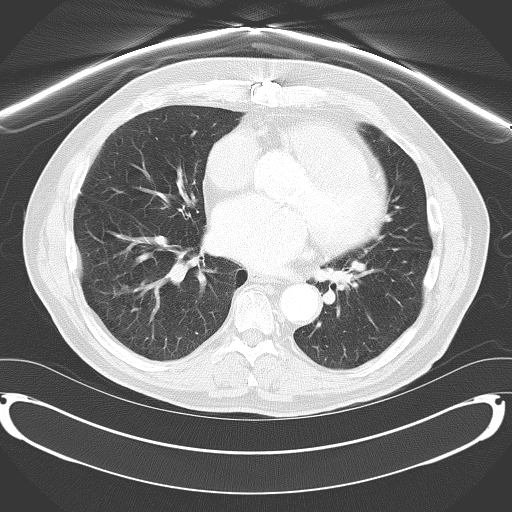
[im 32/63  lung]
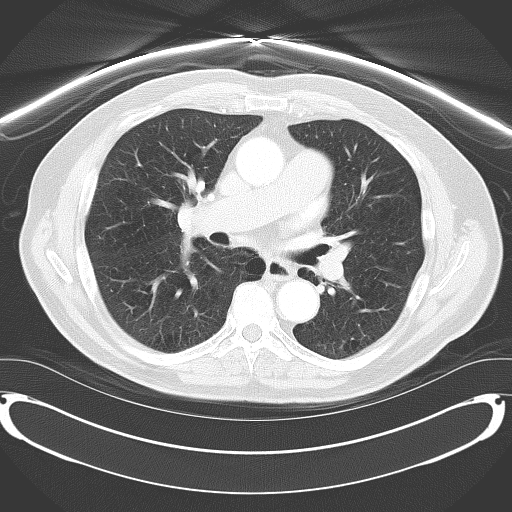
[im 39/63  lung]
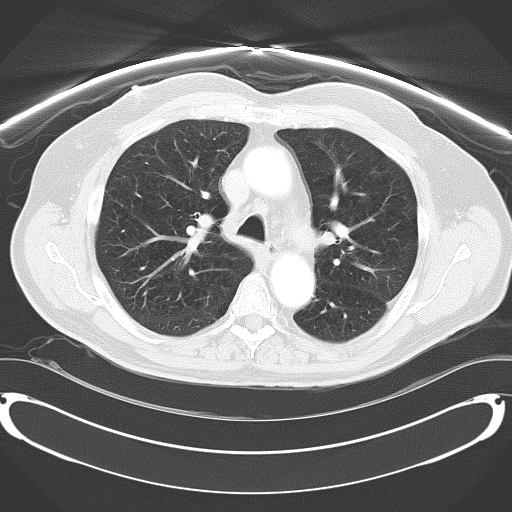

[Series 602: <mpr thick range> · coronal · 0.79mm/px · 3 of 132 slices shown]
[im 27/132  lung]
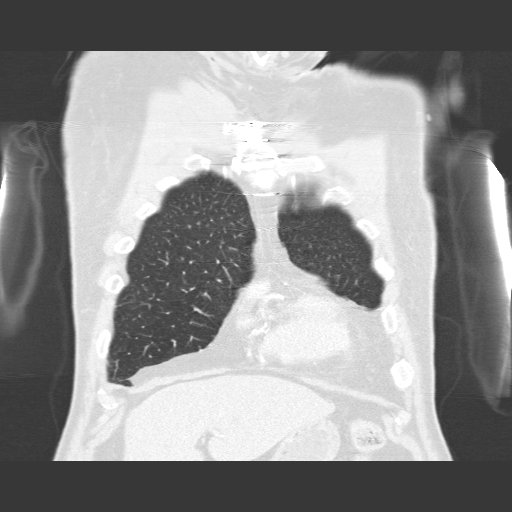
[im 53/132  lung]
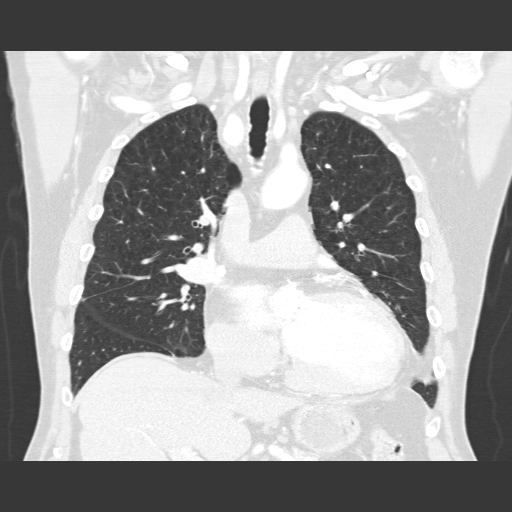
[im 79/132  lung]
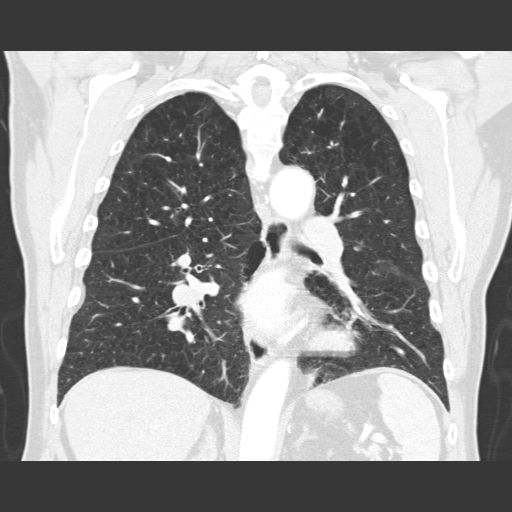

[16 of 36 positions shown; findings below may reference images not displayed]

FINDINGS: The thoracic aorta is normal in caliber and enhancement.
Negative for dissection.  There are atherosclerotic changes in the
aorta and coronary arteries.  There is cardiomegaly.  The patient
is status post aortic valve replacement.

The largest mediastinal lymph node is anterior to the lower trachea
and measures 9 mm short axis.  No pathologically enlarged
lymphadenopathy is identified in the chest.

Negative for pleural or pericardial effusion.

In the right lower lobe is an irregularly shaped nodule with
spiculated margins.  It is seen on images #41 of 42. This nodule
measures approximately 1.6 cm AP diameter by 1.3 cm transverse
diameter by 1.5 cm craniocaudal span.

A right middle lobe nodule (image #38) measures 4 mm, and is
stable. A 6 x 8 mm nodular density along the major fissure (image
#36) is stable.

The patient has moderate centrilobular emphysema.  No airspace
disease or new lung nodule is identified.

There are changes of median sternotomy.  The visualized lower
cervical and the thoracic spine vertebral bodies are normal in
height and alignment.  No fracture or suspicious osseous lesion is
identified in the bony thorax included in the imaging field.

No focal lesion is identified in the visualized portions of the
liver or spleen. Partially imaged cyst associated with the upper
pole of the right kidney appears unchanged.
IMPRESSION: 1.  Spiculated right lower lobe nodule has imaging features highly
suggestive of primary bronchogenic carcinoma.  It measures a
maximum of 1.6 cm on today's study, and demonstrates no significant
interval change compared to prior CT of the chest May 2009.
2.  Stable smoothly marginated nodules in the right middle lobe and
along the right major fissure.
3.  Centrilobular emphysema.
4.  Cardiomegaly, and median sternotomy for aortic valve
replacement.

## 2012-07-13 ENCOUNTER — Other Ambulatory Visit: Payer: Self-pay

## 2012-07-13 MED ORDER — LISINOPRIL 2.5 MG PO TABS: ORAL_TABLET | ORAL | Status: DC

## 2012-07-13 MED ORDER — POTASSIUM CHLORIDE CRYS ER 20 MEQ PO TBCR: EXTENDED_RELEASE_TABLET | ORAL | Status: DC

## 2012-07-13 MED ORDER — BISOPROLOL FUMARATE 5 MG PO TABS: ORAL_TABLET | ORAL | Status: DC

## 2012-07-22 ENCOUNTER — Ambulatory Visit (INDEPENDENT_AMBULATORY_CARE_PROVIDER_SITE_OTHER): Payer: Medicare Other

## 2012-07-22 DIAGNOSIS — I359 Nonrheumatic aortic valve disorder, unspecified: Secondary | ICD-10-CM

## 2012-07-22 DIAGNOSIS — Z7901 Long term (current) use of anticoagulants: Secondary | ICD-10-CM

## 2012-07-22 DIAGNOSIS — Z954 Presence of other heart-valve replacement: Secondary | ICD-10-CM

## 2012-07-22 DIAGNOSIS — I4891 Unspecified atrial fibrillation: Secondary | ICD-10-CM

## 2012-07-30 ENCOUNTER — Telehealth: Payer: Self-pay | Admitting: Emergency Medicine

## 2012-07-30 NOTE — Telephone Encounter (Signed)
Called patient and left message x3. Sent letter 07/29/12.  °

## 2012-08-02 ENCOUNTER — Telehealth: Payer: Self-pay | Admitting: Cardiovascular Disease

## 2012-08-02 DIAGNOSIS — I4891 Unspecified atrial fibrillation: Secondary | ICD-10-CM

## 2012-08-02 NOTE — Telephone Encounter (Signed)
New Prob     States her husbands heart is out of rhythm and would like to speak to nurse regarding this. Please call.

## 2012-08-02 NOTE — Telephone Encounter (Signed)
Spoke with patient and wife who report patient has had irregular rhythm since waking up this morning.  Patient states rate ranging from 60's to 110's.  Patient has not taken medications or had an irregular rhythm in the last 2 years since coming off of Tikosyn.  Patient c/o SOB and slight nausea; denies chest pain.  I advised patient that I would talk with Dr. Graciela Husbands, DOD for advice.  I advised patient that Dr. Graciela Husbands recommends taking an additional Bisoprolol 5 mg tab today and to wait until tomorrow morning to see if patient converts on his own.  Patient verbalized understanding.  I advised patient to skip breakfast if he is not any better in the morning because he may be scheduled for cardioversion tomorrow.  Patient verbalized understanding.  I advised patient that if he feels somewhat better in the morning that we will schedule him to come into the office and treatment will follow diagnosis at that time. Patient states he believes that the a fib might be a result of re-starting Breo recently.

## 2012-08-03 ENCOUNTER — Encounter (HOSPITAL_COMMUNITY): Payer: Self-pay | Admitting: Anesthesiology

## 2012-08-03 ENCOUNTER — Encounter (HOSPITAL_COMMUNITY): Payer: Self-pay

## 2012-08-03 ENCOUNTER — Ambulatory Visit (INDEPENDENT_AMBULATORY_CARE_PROVIDER_SITE_OTHER): Payer: Medicare Other | Admitting: Nurse Practitioner

## 2012-08-03 ENCOUNTER — Ambulatory Visit (HOSPITAL_COMMUNITY): Payer: Medicare Other | Admitting: Anesthesiology

## 2012-08-03 ENCOUNTER — Ambulatory Visit (HOSPITAL_COMMUNITY)
Admission: RE | Admit: 2012-08-03 | Discharge: 2012-08-03 | Disposition: A | Discharge status: Home / Self Care | Payer: Medicare Other | Source: Ambulatory Visit | Attending: Cardiovascular Disease | Admitting: Cardiovascular Disease

## 2012-08-03 ENCOUNTER — Encounter (HOSPITAL_COMMUNITY)
Admission: RE | Disposition: A | Discharge status: Home / Self Care | Payer: Self-pay | Source: Ambulatory Visit | Attending: Cardiovascular Disease

## 2012-08-03 VITALS — BP 130/70 | HR 92 | Resp 22

## 2012-08-03 DIAGNOSIS — Z9981 Dependence on supplemental oxygen: Secondary | ICD-10-CM | POA: Insufficient documentation

## 2012-08-03 DIAGNOSIS — Z85118 Personal history of other malignant neoplasm of bronchus and lung: Secondary | ICD-10-CM | POA: Insufficient documentation

## 2012-08-03 DIAGNOSIS — I359 Nonrheumatic aortic valve disorder, unspecified: Secondary | ICD-10-CM | POA: Insufficient documentation

## 2012-08-03 DIAGNOSIS — Z902 Acquired absence of lung [part of]: Secondary | ICD-10-CM | POA: Insufficient documentation

## 2012-08-03 DIAGNOSIS — Z880 Allergy status to penicillin: Secondary | ICD-10-CM | POA: Insufficient documentation

## 2012-08-03 DIAGNOSIS — I252 Old myocardial infarction: Secondary | ICD-10-CM | POA: Insufficient documentation

## 2012-08-03 DIAGNOSIS — I4891 Unspecified atrial fibrillation: Secondary | ICD-10-CM | POA: Insufficient documentation

## 2012-08-03 DIAGNOSIS — I428 Other cardiomyopathies: Secondary | ICD-10-CM | POA: Insufficient documentation

## 2012-08-03 DIAGNOSIS — Z7982 Long term (current) use of aspirin: Secondary | ICD-10-CM | POA: Insufficient documentation

## 2012-08-03 DIAGNOSIS — I2510 Atherosclerotic heart disease of native coronary artery without angina pectoris: Secondary | ICD-10-CM | POA: Insufficient documentation

## 2012-08-03 DIAGNOSIS — Z7901 Long term (current) use of anticoagulants: Secondary | ICD-10-CM | POA: Insufficient documentation

## 2012-08-03 DIAGNOSIS — J4489 Other specified chronic obstructive pulmonary disease: Secondary | ICD-10-CM | POA: Insufficient documentation

## 2012-08-03 DIAGNOSIS — J449 Chronic obstructive pulmonary disease, unspecified: Secondary | ICD-10-CM | POA: Insufficient documentation

## 2012-08-03 DIAGNOSIS — Z79899 Other long term (current) drug therapy: Secondary | ICD-10-CM | POA: Insufficient documentation

## 2012-08-03 DIAGNOSIS — I10 Essential (primary) hypertension: Secondary | ICD-10-CM | POA: Insufficient documentation

## 2012-08-03 DIAGNOSIS — Z954 Presence of other heart-valve replacement: Secondary | ICD-10-CM | POA: Insufficient documentation

## 2012-08-03 DIAGNOSIS — Z87891 Personal history of nicotine dependence: Secondary | ICD-10-CM | POA: Insufficient documentation

## 2012-08-03 DIAGNOSIS — E785 Hyperlipidemia, unspecified: Secondary | ICD-10-CM | POA: Insufficient documentation

## 2012-08-03 HISTORY — PX: CARDIOVERSION: SHX1299

## 2012-08-03 LAB — CBC WITH DIFFERENTIAL/PLATELET
Eosinophils Absolute: 0.2 10*3/uL (ref 0.0–0.7)
MCHC: 32.4 g/dL (ref 30.0–36.0)
MCV: 95.3 fl (ref 78.0–100.0)
Monocytes Absolute: 0.7 10*3/uL (ref 0.1–1.0)
Neutrophils Relative %: 72.4 % (ref 43.0–77.0)
Platelets: 199 10*3/uL (ref 150.0–400.0)
WBC: 10.1 10*3/uL (ref 4.5–10.5)

## 2012-08-03 LAB — BASIC METABOLIC PANEL
GFR: 74.4 mL/min (ref >60.00)
Potassium: 5 mEq/L (ref 3.5–5.1)
Sodium: 139 mEq/L (ref 135–145)

## 2012-08-03 LAB — PROTIME-INR
INR: 2.69 — ABNORMAL HIGH (ref 0.00–1.49)
Prothrombin Time: 27.3 seconds — ABNORMAL HIGH (ref 11.6–15.2)

## 2012-08-03 SURGERY — CARDIOVERSION
Anesthesia: Monitor Anesthesia Care | Wound class: Clean

## 2012-08-03 MED ORDER — SODIUM CHLORIDE 0.9 % IV SOLN
INTRAVENOUS | Status: DC | PRN
  Administered 2012-08-03: 14:00:00 via INTRAVENOUS

## 2012-08-03 MED ORDER — PROPOFOL 10 MG/ML IV BOLUS
INTRAVENOUS | Status: DC | PRN
  Administered 2012-08-03: 60 mg via INTRAVENOUS

## 2012-08-03 NOTE — H&P (Signed)
History and Physical  Patient ID: John [name] MRN: [id number], SOB: [birth year] 79 y.o. Date of Encounter: 08/03/2012, 2:06 PM  Primary Physician: Illene Regulus, MD Primary Cardiologist: Dr Excell Seltzer   Chief Complaint: Atrial fibrillation  HPI: 79 y.o. male w/ PMHx significant for PAF, aortic stenosis s/p AVR who presented to [facility] on 08/03/2012 with complaints of recurrent afib.  The patient is well-known to me. He has paroxysmal atrial fibrillation but has maintained sinus rhythm now for quite some time on Tikosyn. His medical history is fairly complex. He has chronic lung disease with history of bronchogenic carcinoma. Ever since surgical resection of his lung cancer, he's had major problems with O2 dependent lung disease and chronic dyspnea.  The patient monitors his heart rate and blood pressure very regularly. He's noted that over the past few days his heart rate has been irregular with a rate around 100 - 110 beats per minute. He's had intermittent nausea. He denies any change in his chronic shortness of breath. He's had no chest pain, palpitations, lightheadedness, or syncope. He was evaluated in the office today and found to have atrial fibrillation on his EKG. He presents today for elective cardioversion.  Past Medical History  Diagnosis Date  . Atrial fibrillation   . Aortic valve disorders   . Other primary cardiomyopathies   . Edema     CHRONIC LOWER EXTREMITY  . Unspecified essential hypertension   . Other and unspecified hyperlipidemia   . Heart valve replaced by other means   . Other malaise and fatigue   . Coronary atherosclerosis of native coronary artery   . Acute myocardial infarction, subendocardial infarction, subsequent episode of care   . Unspecified adverse effect of unspecified drug, medicinal and biological substance   . Umbilical hernia without mention of obstruction or gangrene   . Sebaceous cyst   . Labyrinthitis, unspecified   . Personal  history of other diseases of digestive system   . Special screening for malignant neoplasm of prostate   . BC (bronchogenic carcinoma)     S/P LOBECTOMY 2011     Surgical History:  Past Surgical History  Procedure Laterality Date  . Aortic valve replacement  1994    [facility]  . Appendectomy  [facility]  . Abdominal hernia repair    . Lobectomy  2011     Home Meds: Prior to Admission medications   Medication Sig Start Date End Date Taking? Authorizing Provider  acetaminophen (TYLENOL) 500 MG tablet Take 500 mg by mouth every 6 (six) hours as needed.     Yes Historical Provider, MD  albuterol (VENTOLIN HFA) 108 (90 BASE) MCG/ACT inhaler Inhale 2 puffs into the lungs every 4 (four) hours as needed. 07/06/12  Yes Leslye Peer, MD  aspirin 81 MG tablet Take 81 mg by mouth daily.     Yes Historical Provider, MD  bisoprolol (ZEBETA) 5 MG tablet TAKE 1 TABLET BY MOUTH TWICE DAILY. 07/13/12  Yes [name], MD  famotidine (PEPCID) 20 MG tablet Take 20 mg by mouth daily as needed.     Yes Historical Provider, MD  fluticasone (FLONASE) 50 MCG/ACT nasal spray Place 2 sprays into the nose daily as needed. 06/12/11  Yes Leslye Peer, MD  Fluticasone Furoate-Vilanterol (BREO ELLIPTA) 100-25 MCG/INH AEPB Inhale 1 packet into the lungs daily. 03/18/12  Yes Leslye Peer, MD  furosemide (LASIX) 40 MG tablet Take 1 tablet by mouth twice daily 08/22/11  Yes [name], MD  guaiFENesin (MUCINEX) 600 MG 12 hr tablet Take 2 tablets (1,200 mg total) by mouth as needed. 06/12/11  Yes Leslye Peer, MD  lisinopril (PRINIVIL,ZESTRIL) 2.5 MG tablet TAKE 1 TABLET BY MOUTH EVERY DAY 07/13/12  Yes [name], MD  NON FORMULARY 2 L. Oxygen continuous    Yes Historical Provider, MD  potassium chloride SA (K-DUR,KLOR-CON) 20 MEQ tablet TAKE 1 TABLET ONCE DAILY 07/13/12  Yes [name], MD  terbinafine (LAMISIL) 250 MG tablet Take 1 tablet (250 mg total) by mouth daily. 04/28/12  Yes Jacques Navy, MD  warfarin  (COUMADIN) 2.5 MG tablet Take as directed by anticoagulation clinic 03/04/12  Yes [name], MD  warfarin (COUMADIN) 5 MG tablet Take as directed by [place] coumadin clinic. 03/04/12  Yes [name], MD  Aclidinium Bromide (TUDORZA PRESSAIR) 400 MCG/ACT AEPB Inhale 1 packet into the lungs 2 (two) times daily. 03/18/12   Leslye Peer, MD  ipratropium (ATROVENT) 0.03 % nasal spray Place 2 sprays into the nose every 12 (twelve) hours. 12/09/11   Leslye Peer, MD  sodium chloride (OCEAN) 0.65 % nasal spray 1 spray by Nasal route as needed.      Historical Provider, MD    Allergies:  Allergies  Allergen Reactions  . Penicillins     REACTION: rash/hives, facial swelling    History   Social History  . Marital Status: Married    Spouse Name: N/A    Number of Children: 1  . Years of Education: N/A   Occupational History  . [employer]     PHOTOGRAPHER   Social History Main Topics  . Smoking status: Former Smoker -- 1.00 packs/day for 40 years    Types: Cigarettes    Quit date: 02/10/2009  . Smokeless tobacco: Never Used  . Alcohol Use: Yes     Comment: socially "once in a while"  . Drug Use: No  . Sexually Active: Not on file   Other Topics Concern  . Not on file   Social History Narrative   HSG, 2 years college. married '70 . 1 daughter  '70. work: Environmental manager, retired.   ACP - no living will - provided information at today's visit.      Family History  Problem Relation Age of Onset  . Colon cancer Father   . Aneurysm Mother     RUPTURE-NECK   . Coronary artery disease Mother     Review of Systems: Negative except as above.   Physical Exam: Blood pressure 145/92, pulse 90, temperature 98.2 F (36.8 C), temperature source Oral, resp. rate 21, height 6' (1.829 m), weight 81.647 kg (180 lb), SpO2 95.00%. General: Well developed, well nourished, alert and oriented, in no acute distress. HEENT: Normocephalic, atraumatic, sclera non-icteric, no xanthomas, nares are  without discharge.  Neck: Supple.  JVP normal Lungs: Clear bilaterally to auscultation without wheezes, rales, or rhonchi. Breathing is unlabored. Heart: RRR with normal S1, mechanical A2, soft systolic ejection murmur at the RUSB, rubs, or gallops appreciated. Abdomen: Soft, non-tender, non-distended with normoactive bowel sounds. No hepatomegaly. No rebound/guarding. No obvious abdominal masses. Back: No CVA tenderness Msk:  Strength and tone appear normal for age. Extremities: No clubbing, cyanosis, or edema.  Distal pedal pulses are 2+ and equal bilaterally. Neuro: CNII-XII intact, moves all extremities spontaneously. Psych:  Responds to questions appropriately with a normal affect.   Labs:   Lab Results  Component Value Date   WBC 10.1 08/03/2012   HGB 13.7 08/03/2012  HCT 42.3 08/03/2012   MCV 95.3 08/03/2012   PLT 199.0 08/03/2012    Recent Labs Lab 08/03/12 1011  NA 139  K 5.0  CL 103  CO2 26  BUN 15  CREATININE 1.0  CALCIUM 9.5  GLUCOSE 107*   No results found for this basename: CKTOTAL, CKMB, TROPONINI,  in the last 72 hours Lab Results  Component Value Date   CHOL 148 01/14/2012   HDL 26.90* 01/14/2012   LDLCALC 88 01/14/2012   TRIG 165.0* 01/14/2012   Lab Results  Component Value Date   DDIMER  Value: 1.55        AT THE INHOUSE ESTABLISHED CUTOFF VALUE OF 0.48 ug/mL FEU, THIS ASSAY HAS BEEN DOCUMENTED IN THE LITERATURE TO HAVE A SENSITIVITY AND NEGATIVE PREDICTIVE VALUE OF AT LEAST 98 TO 99%.  THE TEST RESULT SHOULD BE CORRELATED WITH AN ASSESSMENT OF THE CLINICAL PROBABILITY OF DVT / VTE.* 01/23/2010    Radiology/Studies:  No results found.   Telemetry: Atrial fibrillation  ASSESSMENT AND PLAN:  79 year old gentleman with recurrent atrial fibrillation. He has chronic dyspnea, on home O2, and aortic valve disease status post mechanical aortic valve replacement. He is INRs have been therapeutic, last checked June 12. These have been reviewed. Will repeat since  it has been almost 2 weeks. Otherwise we'll plan on proceeding with elective cardioversion. I have reviewed risks, indications, and alternatives to this approach with the patient. He agrees to proceed.  Enzo Bi  08/03/2012, 2:06 PM

## 2012-08-03 NOTE — Preoperative (Signed)
Beta Blockers   Reason not to administer Beta Blockers:Not Applicable 

## 2012-08-03 NOTE — CV Procedure (Signed)
   Cardioversion Procedure Note  Name: John [name] MRN: [id number] DOB: September 21, 1933  Procedure: Elective cardioversion  Indication: Atrial fibrillation  Anesthesia: Propofol 60 mg IV per anesthesia  After informed consent was obtained, the patient was cardioverted with 200 joules synchronized cardioversion x 1 with successful conversion from atrial fibrillation to sinus rhythm.  No immediate complications.   [name] 08/03/2012, 2:32 PM

## 2012-08-03 NOTE — Anesthesia Preprocedure Evaluation (Addendum)
Anesthesia Evaluation  Patient identified by MRN, date of birth, ID band Patient awake    Reviewed: Allergy & Precautions, H&P , NPO status , Patient's Chart, lab work & pertinent test results, reviewed documented beta blocker date and time   Airway Mallampati: II  Neck ROM: full    Dental   Pulmonary pneumonia -, COPD   Pulmonary exam normal       Cardiovascular hypertension, Pt. on medications and Pt. on home beta blockers + CAD and + Past MI + dysrhythmias Atrial Fibrillation Rhythm:Irregular     Neuro/Psych    GI/Hepatic   Endo/Other    Renal/GU      Musculoskeletal   Abdominal   Peds  Hematology   Anesthesia Other Findings   Reproductive/Obstetrics                         Anesthesia Physical Anesthesia Plan  ASA: III  Anesthesia Plan: MAC   Post-op Pain Management:    Induction: Intravenous  Airway Management Planned: Mask  Additional Equipment:   Intra-op Plan:   Post-operative Plan:   Informed Consent: I have reviewed the patients History and Physical, chart, labs and discussed the procedure including the risks, benefits and alternatives for the proposed anesthesia with the patient or authorized representative who has indicated his/her understanding and acceptance.   Dental advisory given  Plan Discussed with: CRNA and Anesthesiologist  Anesthesia Plan Comments: (Mild MR, moderate TR, EF 50-55% 01-2012)       Anesthesia Quick Evaluation

## 2012-08-03 NOTE — Progress Notes (Signed)
Patient presents via wheelchair from lobby with his wife.  Patient is alert and oriented x 4, skin is warm, dry and acyanotic and patient is in no acute distress.  Patient speaks clearly; is O2 dependent on 2 L via [place] due to hx of lung disease.  Patient is here for lab work and EKG to confirm atrial fibrillation and will go to [facility] for cardioversion today with Dr. Excell Seltzer.  Patient's blood was drawn by Mellody Dance in the lab and patient and wife were taken to an exam room where I performed a 12-lead EKG.  Dr. Ladona Ridgel, DOD confirmed atrial fibrillation and signed EKG.  A copy was given to the patient and his wife and they were given instructions on arrival at short stay at 12:30 for patient's procedure.  The patient and his wife verbalized understanding and were discharged; patient was in no acute distress.

## 2012-08-03 NOTE — Telephone Encounter (Signed)
F/u   pts wife calling back regarding pt having irregular rhythm

## 2012-08-03 NOTE — Transfer of Care (Signed)
Immediate Anesthesia Transfer of Care Note  Patient: John [name]  Procedure(s) Performed: Procedure(s): CARDIOVERSION (N/A)  Patient Location: Endoscopy Unit  Anesthesia Type:MAC  Level of Consciousness: awake, alert  and oriented  Airway & Oxygen Therapy: Patient Spontanous Breathing and Patient connected to nasal cannula oxygen  Post-op Assessment: Report given to PACU RN  Post vital signs: Reviewed and stable  ComplicationsNone

## 2012-08-03 NOTE — Anesthesia Postprocedure Evaluation (Signed)
  Anesthesia Post-op Note  Patient: John [name]  Procedure(s) Performed: Procedure(s): CARDIOVERSION (N/A)  Patient Location: Endoscopy Unit  Anesthesia Type:MAC  Level of Consciousness: awake, alert  and oriented  Airway and Oxygen Therapy: Patient Spontanous Breathing and Patient connected to nasal cannula oxygen  Post-op Pain: none  Post-op Assessment: Post-op Vital signs reviewed, Patient's Cardiovascular Status Stable, Respiratory Function Stable, Patent Airway, No signs of Nausea or vomiting, Adequate PO intake and Pain level controlled  Post-op Vital Signs: Reviewed and stable  Complications: No apparent anesthesia complications

## 2012-08-03 NOTE — Telephone Encounter (Signed)
Spoke with patient who states he feels the same as he did yesterday with irregular heart rate that ranges from approximately 60 BPM to 115 BPM.  Patient c/o nausea and SOB, denies chest discomfort.  Patient has had a few cups of coffee and medications with a small amount of juice at 0800 today. I paged Dr. Excell Seltzer, patient's primary cardiologist who is hospital DOD today.  Dr. Excell Seltzer advised that patient get lab work and EKG today for potential cardioversion today or tomorrow. Patient is on Coumadin and INR is 2.7 I have ordered a CBC, BMET and EKG and patient is having his wife drive him here to the office.  I have scheduled patient for OP cardioversion today at 1400.  Anesthesiology is aware of patient's intake at 0800 today.  Patient verbalized understanding of instructions.

## 2012-08-04 ENCOUNTER — Encounter (HOSPITAL_COMMUNITY): Payer: Self-pay | Admitting: Cardiovascular Disease

## 2012-08-06 ENCOUNTER — Other Ambulatory Visit: Payer: Self-pay

## 2012-08-06 DIAGNOSIS — I10 Essential (primary) hypertension: Secondary | ICD-10-CM

## 2012-08-06 MED ORDER — FUROSEMIDE 40 MG PO TABS: ORAL_TABLET | ORAL | Status: DC

## 2012-08-12 ENCOUNTER — Encounter: Payer: Self-pay | Admitting: Cardiovascular Disease

## 2012-08-12 ENCOUNTER — Ambulatory Visit (INDEPENDENT_AMBULATORY_CARE_PROVIDER_SITE_OTHER): Payer: Medicare Other | Admitting: Cardiovascular Disease

## 2012-08-12 VITALS — BP 154/64 | HR 47 | Ht 72.0 in | Wt 176.8 lb

## 2012-08-12 DIAGNOSIS — I2510 Atherosclerotic heart disease of native coronary artery without angina pectoris: Secondary | ICD-10-CM

## 2012-08-12 DIAGNOSIS — I4891 Unspecified atrial fibrillation: Secondary | ICD-10-CM

## 2012-08-12 DIAGNOSIS — I359 Nonrheumatic aortic valve disorder, unspecified: Secondary | ICD-10-CM

## 2012-08-12 MED ORDER — BISOPROLOL FUMARATE 5 MG PO TABS: ORAL_TABLET | ORAL | Status: DC

## 2012-08-12 NOTE — Progress Notes (Signed)
HPI:  [name] returns for followup evaluation. He is a 79 year old gentleman with paroxysmal atrial fibrillation, aortic stenosis status post mechanical aortic valve replacement, and chronic O2 dependent lung disease. He's been intolerant to antiarrhythmic therapy with progressive lung disease on amiodarone and prolonged QT on lowest dose Tikosyn. He's been chronic anticoagulated because of atrial fibrillation and mechanical aortic valve. He has tolerated this well. He had recurrence of atrial fibrillation a few weeks ago and underwent elective cardioversion. He returns today for cardioversion followup.  Overall he is doing well since cardioversion. He feels better. He has had a few episodes of lightheadedness without syncope. Breathing is unchanged and he continues to have a cough. He denies chest pain or pressure. He has no other complaints today.  Outpatient Encounter Prescriptions as of 08/12/2012  Medication Sig Dispense Refill  . acetaminophen (TYLENOL) 500 MG tablet Take 500 mg by mouth every 6 (six) hours as needed.        Marland Kitchen albuterol (VENTOLIN HFA) 108 (90 BASE) MCG/ACT inhaler Inhale 2 puffs into the lungs every 4 (four) hours as needed.  1 Inhaler  6  . aspirin 81 MG tablet Take 81 mg by mouth daily.        . bisoprolol (ZEBETA) 5 MG tablet TAKE 1 TABLET BY MOUTH TWICE DAILY.  60 tablet  5  . famotidine (PEPCID) 20 MG tablet Take 20 mg by mouth daily as needed.        . fluticasone (FLONASE) 50 MCG/ACT nasal spray Place 2 sprays into the nose daily as needed.  16 g  5  . furosemide (LASIX) 40 MG tablet Take 1 tablet by mouth twice daily  60 tablet  9  . guaiFENesin (MUCINEX) 600 MG 12 hr tablet Take 2 tablets (1,200 mg total) by mouth as needed.  120 suppository  11  . lisinopril (PRINIVIL,ZESTRIL) 2.5 MG tablet TAKE 1 TABLET BY MOUTH EVERY DAY  30 tablet  6  . NON FORMULARY 2 L. Oxygen continuous       . potassium chloride SA (K-DUR,KLOR-CON) 20 MEQ tablet TAKE 1 TABLET ONCE DAILY   30 tablet  6  . sodium chloride (OCEAN) 0.65 % nasal spray 1 spray by Nasal route as needed.        . warfarin (COUMADIN) 2.5 MG tablet Take as directed by anticoagulation clinic  30 tablet  3  . warfarin (COUMADIN) 5 MG tablet Take as directed by [name].  30 tablet  3  . [DISCONTINUED] Aclidinium Bromide (TUDORZA PRESSAIR) 400 MCG/ACT AEPB Inhale 1 packet into the lungs 2 (two) times daily.  1 each  11  . [DISCONTINUED] clobetasol ointment (TEMOVATE) 0.05 %       . [DISCONTINUED] Fluticasone Furoate-Vilanterol (BREO ELLIPTA) 100-25 MCG/INH AEPB Inhale 1 packet into the lungs daily.  60 each  6  . [DISCONTINUED] ipratropium (ATROVENT) 0.03 % nasal spray Place 2 sprays into the nose every 12 (twelve) hours.  30 mL  12  . [DISCONTINUED] terbinafine (LAMISIL) 250 MG tablet Take 1 tablet (250 mg total) by mouth daily.  30 tablet  2   No facility-administered encounter medications on file as of 08/12/2012.    Allergies  Allergen Reactions  . Penicillins     REACTION: rash/hives, facial swelling    Past Medical History  Diagnosis Date  . Atrial fibrillation   . Aortic valve disorders   . Other primary cardiomyopathies   . Edema     CHRONIC LOWER EXTREMITY  .  Unspecified essential hypertension   . Other and unspecified hyperlipidemia   . Heart valve replaced by other means   . Other malaise and fatigue   . Coronary atherosclerosis of native coronary artery   . Acute myocardial infarction, subendocardial infarction, subsequent episode of care   . Unspecified adverse effect of unspecified drug, medicinal and biological substance   . Umbilical hernia without mention of obstruction or gangrene   . Sebaceous cyst   . Labyrinthitis, unspecified   . Personal history of other diseases of digestive system   . Special screening for malignant neoplasm of prostate   . BC (bronchogenic carcinoma)     S/P LOBECTOMY 2011    ROS: Negative except as per HPI  BP 154/64  Pulse 47   Ht 6' (1.829 m)  Wt 176 lb 12.8 oz (80.196 kg)  BMI 23.97 kg/m2  SpO2 95%  PHYSICAL EXAM: Pt is alert and oriented, NAD HEENT: normal Neck: JVP - normal, carotids 2+= without bruits Lungs: Clear but with decreased breath sounds at the right base, no crackles CV: RRR with crisp mechanical A2 best heard at the right upper sternal border Abd: soft, NT, Positive BS, no hepatomegaly Ext: Trace to 1+ pretibial edema bilaterally, distal pulses intact and equal Skin: warm/dry no rash  EKG:  Marked sinus bradycardia 47 beats per minute, nonspecific ST abnormality.  ASSESSMENT AND PLAN: 1. Paroxysmal atrial fibrillation. The patient is anticoagulated with warfarin. He bradycardic today and has episodes of lightheadedness. I have recommended reducing his bisoprolol to 5 mg once daily. Otherwise continue same medical therapy. He understands antiarrhythmic options are extremely limited with his side effects/toxicity to both amiodarone and tikosyn.  2. Aortic stenosis status post mechanical aortic valve replacement. He is stable. Most recent echo was reviewed. Exam is unchanged. He continues on warfarin.  3. Pulmonary hypertension. This is worsening, presumably related to chronic hypoxemia from lung disease. He is followed by Dr. Delton Coombes. We have discussed consideration of right heart catheterization and selective pulmonary vasodilator therapy. I reviewed Dr. Kavin Leech most recent notes and plans are to hold off until his pulmonary status and hypoxemia are optimized.  [name] 08/12/2012 9:56 AM

## 2012-08-12 NOTE — Patient Instructions (Addendum)
Your physician has recommended you make the following change in your medication: DECREASE Bisoprolol to 5mg  one by mouth daily  Your physician wants you to follow-up in: 6 MONTHS with Dr Excell Seltzer.  You will receive a reminder letter in the mail two months in advance. If you don't receive a letter, please call our office to schedule the follow-up appointment.

## 2012-08-19 ENCOUNTER — Ambulatory Visit (INDEPENDENT_AMBULATORY_CARE_PROVIDER_SITE_OTHER): Payer: Medicare Other

## 2012-08-19 DIAGNOSIS — I359 Nonrheumatic aortic valve disorder, unspecified: Secondary | ICD-10-CM

## 2012-08-19 DIAGNOSIS — Z954 Presence of other heart-valve replacement: Secondary | ICD-10-CM

## 2012-08-19 DIAGNOSIS — Z7901 Long term (current) use of anticoagulants: Secondary | ICD-10-CM

## 2012-08-19 DIAGNOSIS — I4891 Unspecified atrial fibrillation: Secondary | ICD-10-CM

## 2012-08-19 LAB — POCT INR: INR: 3.3

## 2012-08-25 ENCOUNTER — Telehealth: Payer: Self-pay | Admitting: *Deleted

## 2012-08-25 NOTE — Telephone Encounter (Signed)
According to wife patient woke up yesterday and had broken blood vessel in eye, eye was sore and it did change his vision, they went to see Dr Jettie Pagan and he was unable to give them a cause of bleed, they have a f/u appt this Friday with him again, we will recheck his INR prior to that visit on Friday.

## 2012-08-27 ENCOUNTER — Ambulatory Visit (INDEPENDENT_AMBULATORY_CARE_PROVIDER_SITE_OTHER): Payer: Medicare Other | Admitting: Pharmacist

## 2012-08-27 DIAGNOSIS — I4891 Unspecified atrial fibrillation: Secondary | ICD-10-CM

## 2012-08-27 DIAGNOSIS — Z954 Presence of other heart-valve replacement: Secondary | ICD-10-CM

## 2012-08-27 DIAGNOSIS — I359 Nonrheumatic aortic valve disorder, unspecified: Secondary | ICD-10-CM

## 2012-08-27 DIAGNOSIS — Z7901 Long term (current) use of anticoagulants: Secondary | ICD-10-CM

## 2012-08-27 NOTE — Patient Instructions (Signed)
Patient called back stating Dr Jettie Pagan sending him to see a retina specialist, Dr Luciana Axe, on Monday 08/30/2012, he is to call us and let us know what Dr Luciana Axe says.

## 2012-08-31 ENCOUNTER — Ambulatory Visit (INDEPENDENT_AMBULATORY_CARE_PROVIDER_SITE_OTHER): Payer: Medicare Other | Admitting: Internal Medicine

## 2012-08-31 ENCOUNTER — Encounter: Payer: Self-pay | Admitting: Internal Medicine

## 2012-08-31 ENCOUNTER — Other Ambulatory Visit (INDEPENDENT_AMBULATORY_CARE_PROVIDER_SITE_OTHER): Payer: Medicare Other

## 2012-08-31 DIAGNOSIS — M109 Gout, unspecified: Secondary | ICD-10-CM

## 2012-08-31 LAB — URIC ACID: Uric Acid, Serum: 8.5 mg/dL — ABNORMAL HIGH (ref 4.0–7.8)

## 2012-08-31 MED ORDER — METHYLPREDNISOLONE ACETATE 80 MG/ML IJ SUSP
80.0000 mg | Freq: Once | INTRAMUSCULAR | Status: AC
  Administered 2012-08-31: 80 mg via INTRAMUSCULAR

## 2012-08-31 MED ORDER — ALLOPURINOL 100 MG PO TABS: 100.0000 mg | ORAL_TABLET | Freq: Every day | ORAL | Status: DC

## 2012-08-31 NOTE — Progress Notes (Signed)
Subjective:    Patient ID: John [name], male    DOB: 01/03/1934, 79 y.o.   MRN: [id number]  HPI  Pt presents to the clinic today with c/o right elbow pain. This started 3 days ago. He reports that this it is tender, red and warm. He has not had any specific injury that she is aware of. He went to urgent care yesterday. They put him on colchicine. He did not have his uric acid level drawn. It has not helped. He has had gout in his right big toe before but it was not treated. He does not eat a lot of meats.   Review of Systems      Past Medical History  Diagnosis Date  . Atrial fibrillation   . Aortic valve disorders   . Other primary cardiomyopathies   . Edema     CHRONIC LOWER EXTREMITY  . Unspecified essential hypertension   . Other and unspecified hyperlipidemia   . Heart valve replaced by other means   . Other malaise and fatigue   . Coronary atherosclerosis of native coronary artery   . Acute myocardial infarction, subendocardial infarction, subsequent episode of care   . Unspecified adverse effect of unspecified drug, medicinal and biological substance   . Umbilical hernia without mention of obstruction or gangrene   . Sebaceous cyst   . Labyrinthitis, unspecified   . Personal history of other diseases of digestive system   . Special screening for malignant neoplasm of prostate   . BC (bronchogenic carcinoma)     S/P LOBECTOMY 2011    Current Outpatient Prescriptions  Medication Sig Dispense Refill  . acetaminophen (TYLENOL) 500 MG tablet Take 500 mg by mouth every 6 (six) hours as needed.        Marland Kitchen albuterol (VENTOLIN HFA) 108 (90 BASE) MCG/ACT inhaler Inhale 2 puffs into the lungs every 4 (four) hours as needed.  1 Inhaler  6  . aspirin 81 MG tablet Take 81 mg by mouth daily.        . bisoprolol (ZEBETA) 5 MG tablet TAKE 1 TABLET BY MOUTH DAILY.  30 tablet  11  . famotidine (PEPCID) 20 MG tablet Take 20 mg by mouth daily as needed.        . fluticasone (FLONASE) 50  MCG/ACT nasal spray Place 2 sprays into the nose daily as needed.  16 g  5  . furosemide (LASIX) 40 MG tablet Take 1 tablet by mouth twice daily  60 tablet  9  . guaiFENesin (MUCINEX) 600 MG 12 hr tablet Take 2 tablets (1,200 mg total) by mouth as needed.  120 suppository  11  . lisinopril (PRINIVIL,ZESTRIL) 2.5 MG tablet TAKE 1 TABLET BY MOUTH EVERY DAY  30 tablet  6  . NON FORMULARY 2 L. Oxygen continuous       . potassium chloride SA (K-DUR,KLOR-CON) 20 MEQ tablet TAKE 1 TABLET ONCE DAILY  30 tablet  6  . sodium chloride (OCEAN) 0.65 % nasal spray 1 spray by Nasal route as needed.        . warfarin (COUMADIN) 2.5 MG tablet Take as directed by anticoagulation clinic  30 tablet  3  . warfarin (COUMADIN) 5 MG tablet Take as directed by [facility] [facility]mad[facility] clinic.  30 tablet  3   No current facility-administered medications for this visit.    Allergies  Allergen Reactions  . Penicillins     REACTION: rash/hives, facial swelling    Family History  Problem Relation Age  of Onset  . Colon cancer Father   . Aneurysm Mother     RUPTURE-NECK   . Coronary artery disease Mother     History   Social History  . Marital Status: Married    Spouse Name: N/A    Number of Children: 1  . Years of Education: N/A   Occupational History  . [employer]     PHOTOGRAPHER   Social History Main Topics  . Smoking status: Former Smoker -- 1.00 packs/day for 40 years    Types: Cigarettes    Quit date: 02/10/2009  . Smokeless tobacco: Never Used  . Alcohol Use: Yes     Comment: socially "once in a while"  . Drug Use: Yes  . Sexually Active: Not on file   Other Topics Concern  . Not on file   Social History Narrative   HSG, 2 years college. married '70 . 1 daughter  '70. work: Environmental manager, retired.   ACP - no living will - provided information at today's visit.      Constitutional: Denies fever, malaise, fatigue, headache or abrupt weight changes.  Musculoskeletal: Pt reports right elbow pain.  Denies difficulty with gait, muscle pain. Skin: Pt reports warmth and redness of right elbow. Denies redness, rashes, lesions or ulcercations.    No other specific complaints in a complete review of systems (except as listed in HPI above).  Objective:   Physical Exam   BP 112/70  Pulse 57  Temp(Src) 97.9 F (36.6 C) (Oral)  Wt 175 lb (79.379 kg)  BMI 23.73 kg/m2  SpO2 86% Wt Readings from Last 3 Encounters:  08/31/12 175 lb (79.379 kg)  08/12/12 176 lb 12.8 oz (80.196 kg)  08/03/12 180 lb (81.647 kg)    General: Appears his stated age, well developed, well nourished in NAD. Skin: Warm, dry and intact. No rashes, lesions or ulcerations noted Warmth and redness noted of right elbow. Cardiovascular: Normal rate and rhythm. S1,S2 noted.  No murmur, rubs or gallops noted. No JVD or BLE edema. No carotid bruits noted. Pulmonary/Chest: Normal effort and positive vesicular breath sounds. No respiratory distress. No wheezes, rales or ronchi noted.  Musculoskeletal: Decreased range of motion. Right elbow swelling. No difficulty with gait.    BMET    Component Value Date/Time   NA 139 08/03/2012 1011   K 5.0 08/03/2012 1011   CL 103 08/03/2012 1011   CO2 26 08/03/2012 1011   GLUCOSE 107* 08/03/2012 1011   BUN 15 08/03/2012 1011   CREATININE 1.0 08/03/2012 1011   CALCIUM 9.5 08/03/2012 1011   GFRNONAA >60 04/01/2010 0545   GFRAA  Value: >60        The eGFR has been calculated using the MDRD equation. This calculation has not been validated in all clinical situations. eGFR's persistently <60 mL/min signify possible Chronic Kidney Disease. 04/01/2010 0545    Lipid Panel     Component Value Date/Time   CHOL 148 01/14/2012 1143   TRIG 165.0* 01/14/2012 1143   HDL 26.90* 01/14/2012 1143   CHOLHDL 6 01/14/2012 1143   VLDL 33.0 01/14/2012 1143   LDLCALC 88 01/14/2012 1143    CBC    Component Value Date/Time   WBC 10.1 08/03/2012 1011   RBC 4.44 08/03/2012 1011   HGB 13.7 08/03/2012 1011   HCT  42.3 08/03/2012 1011   PLT 199.0 08/03/2012 1011   MCV 95.3 08/03/2012 1011   MCH 28.2 03/31/2010 0520   MCHC 32.4 08/03/2012 1011   RDW 14.9*  08/03/2012 1011   LYMPHSABS 1.8 08/03/2012 1011   MONOABS 0.7 08/03/2012 1011   EOSABS 0.2 08/03/2012 1011   BASOSABS 0.0 08/03/2012 1011    Hgb A1C No results found for this basename: HGBA1C        Assessment & Plan:   Gout of right elbow, new onset:  80 mg Depo IM today Continue colchicine as needed When flare has resolved, allopurinol daily Uric acid level today

## 2012-08-31 NOTE — Patient Instructions (Signed)
Gout  Gout is an inflammatory condition (arthritis) caused by a buildup of uric acid crystals in the joints. Uric acid is a chemical that is normally present in the blood. Under some circumstances, uric acid can form into crystals in your joints. This causes joint redness, soreness, and swelling (inflammation). Repeat attacks are common. Over time, uric acid crystals can form into masses (tophi) near a joint, causing disfigurement. Gout is treatable and often preventable.  CAUSES   The disease begins with elevated levels of uric acid in the blood. Uric acid is produced by your body when it breaks down a naturally found substance called purines. This also happens when you eat certain foods such as meats and fish. Causes of an elevated uric acid level include:   Being passed down from parent to child (heredity).   Diseases that cause increased uric acid production (obesity, psoriasis, some cancers).   Excessive alcohol use.   Diet, especially diets rich in meat and seafood.   Medicines, including certain cancer-fighting drugs (chemotherapy), diuretics, and aspirin.   Chronic kidney disease. The kidneys are no longer able to remove uric acid well.   Problems with metabolism.  Conditions strongly associated with gout include:   Obesity.   High blood pressure.   High cholesterol.   Diabetes.  Not everyone with elevated uric acid levels gets gout. It is not understood why some people get gout and others do not. Surgery, joint injury, and eating too much of certain foods are some of the factors that can lead to gout.  SYMPTOMS    An attack of gout comes on quickly. It causes intense pain with redness, swelling, and warmth in a joint.   Fever can occur.   Often, only one joint is involved. Certain joints are more commonly involved:   Base of the big toe.   Knee.   Ankle.   Wrist.   Finger.  Without treatment, an attack usually goes away in a few days to weeks. Between attacks, you usually will not have  symptoms, which is different from many other forms of arthritis.  DIAGNOSIS   Your caregiver will suspect gout based on your symptoms and exam. Removal of fluid from the joint (arthrocentesis) is done to check for uric acid crystals. Your caregiver will give you a medicine that numbs the area (local anesthetic) and use a needle to remove joint fluid for exam. Gout is confirmed when uric acid crystals are seen in joint fluid, using a special microscope. Sometimes, blood, urine, and X-ray tests are also used.  TREATMENT   There are 2 phases to gout treatment: treating the sudden onset (acute) attack and preventing attacks (prophylaxis).  Treatment of an Acute Attack   Medicines are used. These include anti-inflammatory medicines or steroid medicines.   An injection of steroid medicine into the affected joint is sometimes necessary.   The painful joint is rested. Movement can worsen the arthritis.   You may use warm or cold treatments on painful joints, depending which works best for you.   Discuss the use of coffee, vitamin C, or cherries with your caregiver. These may be helpful treatment options.  Treatment to Prevent Attacks  After the acute attack subsides, your caregiver may advise prophylactic medicine. These medicines either help your kidneys eliminate uric acid from your body or decrease your uric acid production. You may need to stay on these medicines for a very long time.  The early phase of treatment with prophylactic medicine can be associated   with an increase in acute gout attacks. For this reason, during the first few months of treatment, your caregiver may also advise you to take medicines usually used for acute gout treatment. Be sure you understand your caregiver's directions.  You should also discuss dietary treatment with your caregiver. Certain foods such as meats and fish can increase uric acid levels. Other foods such as dairy can decrease levels. Your caregiver can give you a list of foods  to avoid.  HOME CARE INSTRUCTIONS    Do not take aspirin to relieve pain. This raises uric acid levels.   Only take over-the-counter or prescription medicines for pain, discomfort, or fever as directed by your caregiver.   Rest the joint as much as possible. When in bed, keep sheets and blankets off painful areas.   Keep the affected joint raised (elevated).   Use crutches if the painful joint is in your leg.   Drink enough water and fluids to keep your urine clear or pale yellow. This helps your body get rid of uric acid. Do not drink alcoholic beverages. They slow the passage of uric acid.   Follow your caregiver's dietary instructions. Pay careful attention to the amount of protein you eat. Your daily diet should emphasize fruits, vegetables, whole grains, and fat-free or low-fat milk products.   Maintain a healthy body weight.  SEEK MEDICAL CARE IF:    You have an oral temperature above 102 F (38.9 C).   You develop diarrhea, vomiting, or any side effects from medicines.   You do not feel better in 24 hours, or you are getting worse.  SEEK IMMEDIATE MEDICAL CARE IF:    Your joint becomes suddenly more tender and you have:   Chills.   An oral temperature above 102 F (38.9 C), not controlled by medicine.  MAKE SURE YOU:    Understand these instructions.   Will watch your condition.   Will get help right away if you are not doing well or get worse.  Document Released: 01/25/2000 Document Revised: 04/21/2011 Document Reviewed: 05/07/2009  [employer] Patient Information 2014 [employer]

## 2012-09-08 ENCOUNTER — Ambulatory Visit (INDEPENDENT_AMBULATORY_CARE_PROVIDER_SITE_OTHER): Payer: Medicare Other | Admitting: *Deleted

## 2012-09-08 DIAGNOSIS — I359 Nonrheumatic aortic valve disorder, unspecified: Secondary | ICD-10-CM

## 2012-09-08 DIAGNOSIS — Z954 Presence of other heart-valve replacement: Secondary | ICD-10-CM

## 2012-09-08 DIAGNOSIS — Z7901 Long term (current) use of anticoagulants: Secondary | ICD-10-CM

## 2012-09-08 DIAGNOSIS — I4891 Unspecified atrial fibrillation: Secondary | ICD-10-CM

## 2012-09-08 LAB — POCT INR: INR: 2.4

## 2012-09-19 ENCOUNTER — Observation Stay (HOSPITAL_COMMUNITY)
Admission: EM | Admit: 2012-09-19 | Discharge: 2012-09-21 | Disposition: A | Discharge status: Home / Self Care | Payer: Medicare Other | Attending: Internal Medicine | Admitting: Internal Medicine

## 2012-09-19 ENCOUNTER — Telehealth: Payer: Self-pay | Admitting: Cardiovascular Disease

## 2012-09-19 ENCOUNTER — Emergency Department (HOSPITAL_COMMUNITY): Payer: Medicare Other

## 2012-09-19 ENCOUNTER — Encounter (HOSPITAL_COMMUNITY): Payer: Self-pay

## 2012-09-19 DIAGNOSIS — I4891 Unspecified atrial fibrillation: Principal | ICD-10-CM | POA: Insufficient documentation

## 2012-09-19 DIAGNOSIS — R000 Tachycardia, unspecified: Secondary | ICD-10-CM

## 2012-09-19 DIAGNOSIS — I359 Nonrheumatic aortic valve disorder, unspecified: Secondary | ICD-10-CM

## 2012-09-19 DIAGNOSIS — I10 Essential (primary) hypertension: Secondary | ICD-10-CM | POA: Diagnosis present

## 2012-09-19 DIAGNOSIS — I2510 Atherosclerotic heart disease of native coronary artery without angina pectoris: Secondary | ICD-10-CM | POA: Diagnosis present

## 2012-09-19 DIAGNOSIS — J441 Chronic obstructive pulmonary disease with (acute) exacerbation: Secondary | ICD-10-CM | POA: Diagnosis present

## 2012-09-19 DIAGNOSIS — I272 Pulmonary hypertension, unspecified: Secondary | ICD-10-CM

## 2012-09-19 DIAGNOSIS — E785 Hyperlipidemia, unspecified: Secondary | ICD-10-CM | POA: Diagnosis present

## 2012-09-19 DIAGNOSIS — J4489 Other specified chronic obstructive pulmonary disease: Secondary | ICD-10-CM | POA: Insufficient documentation

## 2012-09-19 DIAGNOSIS — R0602 Shortness of breath: Secondary | ICD-10-CM | POA: Insufficient documentation

## 2012-09-19 DIAGNOSIS — I2789 Other specified pulmonary heart diseases: Secondary | ICD-10-CM | POA: Insufficient documentation

## 2012-09-19 DIAGNOSIS — Z9981 Dependence on supplemental oxygen: Secondary | ICD-10-CM | POA: Insufficient documentation

## 2012-09-19 DIAGNOSIS — Z954 Presence of other heart-valve replacement: Secondary | ICD-10-CM

## 2012-09-19 DIAGNOSIS — J449 Chronic obstructive pulmonary disease, unspecified: Secondary | ICD-10-CM | POA: Insufficient documentation

## 2012-09-19 DIAGNOSIS — Z7901 Long term (current) use of anticoagulants: Secondary | ICD-10-CM

## 2012-09-19 DIAGNOSIS — I4819 Other persistent atrial fibrillation: Secondary | ICD-10-CM

## 2012-09-19 HISTORY — DX: Chronic obstructive pulmonary disease, unspecified: J44.9

## 2012-09-19 HISTORY — DX: Shortness of breath: R06.02

## 2012-09-19 HISTORY — DX: Gastro-esophageal reflux disease without esophagitis: K21.9

## 2012-09-19 HISTORY — DX: Paroxysmal atrial fibrillation: I48.0

## 2012-09-19 LAB — PRO B NATRIURETIC PEPTIDE: Pro B Natriuretic peptide (BNP): 4659 pg/mL — ABNORMAL HIGH (ref 0–450)

## 2012-09-19 LAB — BASIC METABOLIC PANEL
CO2: 29 mEq/L (ref 19–32)
Calcium: 10 mg/dL (ref 8.4–10.5)
Creatinine, Ser: 0.93 mg/dL (ref 0.50–1.35)
GFR calc non Af Amer: 79 mL/min — ABNORMAL LOW (ref >90)
Sodium: 137 mEq/L (ref 135–145)

## 2012-09-19 LAB — CBC
MCH: 32 pg (ref 26.0–34.0)
MCHC: 34.7 g/dL (ref 30.0–36.0)
MCV: 92.4 fL (ref 78.0–100.0)
Platelets: 222 10*3/uL (ref 150–400)
RBC: 4.87 MIL/uL (ref 4.22–5.81)

## 2012-09-19 LAB — POCT I-STAT TROPONIN I

## 2012-09-19 MED ORDER — BISOPROLOL FUMARATE 5 MG PO TABS
5.0000 mg | ORAL_TABLET | Freq: Every day | ORAL | Status: DC
  Administered 2012-09-21: 5 mg via ORAL
  Filled 2012-09-19 (×2): qty 1

## 2012-09-19 MED ORDER — ALLOPURINOL 100 MG PO TABS
100.0000 mg | ORAL_TABLET | Freq: Every day | ORAL | Status: DC
  Administered 2012-09-20 – 2012-09-21 (×2): 100 mg via ORAL
  Filled 2012-09-19 (×2): qty 1

## 2012-09-19 MED ORDER — ONDANSETRON HCL 4 MG/2ML IJ SOLN: 4.0000 mg | Freq: Four times a day (QID) | INTRAMUSCULAR | Status: DC | PRN

## 2012-09-19 MED ORDER — ASPIRIN 81 MG PO TABS: 81.0000 mg | ORAL_TABLET | Freq: Every day | ORAL | Status: DC

## 2012-09-19 MED ORDER — ALBUTEROL SULFATE HFA 108 (90 BASE) MCG/ACT IN AERS: 2.0000 | INHALATION_SPRAY | RESPIRATORY_TRACT | Status: DC | PRN

## 2012-09-19 MED ORDER — ACETAMINOPHEN 500 MG PO TABS: 1000.0000 mg | ORAL_TABLET | Freq: Four times a day (QID) | ORAL | Status: DC | PRN

## 2012-09-19 MED ORDER — FAMOTIDINE 20 MG PO TABS
20.0000 mg | ORAL_TABLET | Freq: Every day | ORAL | Status: DC | PRN
Start: 2012-09-19 — End: 2012-09-21
  Filled 2012-09-19: qty 1

## 2012-09-19 MED ORDER — ASPIRIN 300 MG RE SUPP
300.0000 mg | RECTAL | Status: AC
  Filled 2012-09-19: qty 1

## 2012-09-19 MED ORDER — WARFARIN - PHARMACIST DOSING INPATIENT
Freq: Every day | Status: DC
  Administered 2012-09-19: 18:00:00

## 2012-09-19 MED ORDER — GUAIFENESIN ER 600 MG PO TB12
1200.0000 mg | ORAL_TABLET | ORAL | Status: DC | PRN
  Administered 2012-09-19: 1200 mg via ORAL
  Filled 2012-09-19: qty 2

## 2012-09-19 MED ORDER — DRONEDARONE HCL 400 MG PO TABS
400.0000 mg | ORAL_TABLET | Freq: Two times a day (BID) | ORAL | Status: DC
  Administered 2012-09-19 – 2012-09-21 (×4): 400 mg via ORAL
  Filled 2012-09-19 (×6): qty 1

## 2012-09-19 MED ORDER — POTASSIUM CHLORIDE CRYS ER 20 MEQ PO TBCR
20.0000 meq | EXTENDED_RELEASE_TABLET | Freq: Every day | ORAL | Status: DC
  Administered 2012-09-20 – 2012-09-21 (×2): 20 meq via ORAL
  Filled 2012-09-19 (×2): qty 1

## 2012-09-19 MED ORDER — LISINOPRIL 2.5 MG PO TABS
2.5000 mg | ORAL_TABLET | Freq: Every day | ORAL | Status: DC
  Administered 2012-09-20 – 2012-09-21 (×2): 2.5 mg via ORAL
  Filled 2012-09-19 (×2): qty 1

## 2012-09-19 MED ORDER — WARFARIN SODIUM 2.5 MG PO TABS
2.5000 mg | ORAL_TABLET | ORAL | Status: DC
  Administered 2012-09-20: 2.5 mg via ORAL
  Filled 2012-09-19: qty 1

## 2012-09-19 MED ORDER — OFF THE BEAT BOOK
Freq: Once | Status: DC
  Filled 2012-09-19 (×2): qty 1

## 2012-09-19 MED ORDER — SODIUM CHLORIDE 0.9 % IV SOLN: 250.0000 mL | INTRAVENOUS | Status: DC | PRN

## 2012-09-19 MED ORDER — METOPROLOL TARTRATE 1 MG/ML IV SOLN
2.5000 mg | Freq: Once | INTRAVENOUS | Status: AC
  Administered 2012-09-19: 2.5 mg via INTRAVENOUS
  Filled 2012-09-19: qty 5

## 2012-09-19 MED ORDER — NITROGLYCERIN 0.4 MG SL SUBL: 0.4000 mg | SUBLINGUAL_TABLET | SUBLINGUAL | Status: DC | PRN

## 2012-09-19 MED ORDER — SODIUM CHLORIDE 0.9 % IJ SOLN
3.0000 mL | Freq: Two times a day (BID) | INTRAMUSCULAR | Status: DC
  Administered 2012-09-19: 3 mL via INTRAVENOUS

## 2012-09-19 MED ORDER — ACETAMINOPHEN 325 MG PO TABS: 650.0000 mg | ORAL_TABLET | ORAL | Status: DC | PRN

## 2012-09-19 MED ORDER — ASPIRIN 81 MG PO CHEW
324.0000 mg | CHEWABLE_TABLET | ORAL | Status: AC
  Filled 2012-09-19: qty 4

## 2012-09-19 MED ORDER — WARFARIN SODIUM 5 MG PO TABS
5.0000 mg | ORAL_TABLET | ORAL | Status: DC
  Administered 2012-09-19: 5 mg via ORAL
  Filled 2012-09-19 (×2): qty 1

## 2012-09-19 MED ORDER — FUROSEMIDE 40 MG PO TABS
40.0000 mg | ORAL_TABLET | Freq: Two times a day (BID) | ORAL | Status: DC
  Administered 2012-09-20 – 2012-09-21 (×2): 40 mg via ORAL
  Filled 2012-09-19 (×6): qty 1

## 2012-09-19 MED ORDER — ASPIRIN EC 81 MG PO TBEC
81.0000 mg | DELAYED_RELEASE_TABLET | Freq: Every day | ORAL | Status: DC
  Administered 2012-09-20 – 2012-09-21 (×2): 81 mg via ORAL
  Filled 2012-09-19 (×2): qty 1

## 2012-09-19 MED ORDER — SODIUM CHLORIDE 0.9 % IJ SOLN: 3.0000 mL | INTRAMUSCULAR | Status: DC | PRN

## 2012-09-19 NOTE — Progress Notes (Signed)
ANTICOAGULATION CONSULT NOTE - Initial Consult  Pharmacy Consult for Coumadin  Indication: atrial fibrillation and mechanical aortic valve  Allergies  Allergen Reactions  . Penicillins     REACTION: rash/hives, facial swelling    Patient Measurements:    Vital Signs: Temp: 97.6 F (36.4 C) (08/10 1320) Temp src: Oral (08/10 1320) BP: 100/65 mmHg (08/10 1500) Pulse Rate: 63 (08/10 1500)  Labs:  Recent Labs  09/19/12 1320  HGB 15.6  HCT 45.0  PLT 222  LABPROT 28.4*  INR 2.78*  CREATININE 0.93    The CrCl is unknown because both a height and weight (above a minimum accepted value) are required for this calculation.   Medical History: Past Medical History  Diagnosis Date  . Atrial fibrillation   . Aortic valve disorders   . Other primary cardiomyopathies   . Edema     CHRONIC LOWER EXTREMITY  . Unspecified essential hypertension   . Other and unspecified hyperlipidemia   . Heart valve replaced by other means   . Other malaise and fatigue   . Coronary atherosclerosis of native coronary artery   . Acute myocardial infarction, subendocardial infarction, subsequent episode of care   . Unspecified adverse effect of unspecified drug, medicinal and biological substance   . Umbilical hernia without mention of obstruction or gangrene   . Sebaceous cyst   . Labyrinthitis, unspecified   . Personal history of other diseases of digestive system   . Special screening for malignant neoplasm of prostate   . BC (bronchogenic carcinoma)     S/P LOBECTOMY 2011    Medications:  Prescriptions prior to admission  Medication Sig Dispense Refill  . acetaminophen (TYLENOL) 500 MG tablet Take 1,000 mg by mouth every 6 (six) hours as needed for pain.       Marland Kitchen albuterol (VENTOLIN HFA) 108 (90 BASE) MCG/ACT inhaler Inhale 2 puffs into the lungs every 4 (four) hours as needed.  1 Inhaler  6  . aspirin 81 MG tablet Take 81 mg by mouth daily.        . bisoprolol (ZEBETA) 5 MG tablet  Take 5 mg by mouth daily.      . colchicine 0.6 MG tablet Take 0.6 mg by mouth daily. 2 tablets at onset and then 1-2 tablets as needed.      . famotidine (PEPCID) 20 MG tablet Take 20 mg by mouth daily as needed for heartburn.       . furosemide (LASIX) 40 MG tablet Take 1 tablet by mouth twice daily  60 tablet  9  . guaiFENesin (MUCINEX) 600 MG 12 hr tablet Take 2 tablets (1,200 mg total) by mouth as needed.  120 suppository  11  . lisinopril (PRINIVIL,ZESTRIL) 2.5 MG tablet Take 2.5 mg by mouth daily.      . NON FORMULARY 2 L. Oxygen continuous       . potassium chloride SA (K-DUR,KLOR-CON) 20 MEQ tablet Take 20 mEq by mouth daily.      Marland Kitchen PREDNISOLONE ACETATE OP Place 1 drop into the left eye daily.      Marland Kitchen warfarin (COUMADIN) 5 MG tablet Take 2.5-5 mg by mouth daily. Monday and Friday 2.5mg .  Tuesday, Wednesday, Thursday, Saturday and Sunday 5mg .      . allopurinol (ZYLOPRIM) 100 MG tablet Take 1 tablet (100 mg total) by mouth daily.  30 tablet  6  . ofloxacin (FLOXIN) 0.3 % otic solution Using one drop qid        Assessment: 79  year old male on chronic anticoagulation with Coumadin for atrial fibrillation and [employer] AVR.  His INR is therapeutic on his home Coumadin regimen.  He is starting Multaq (dronaderone) which has the potential to interact with Coumadin, however the extent and timing of this interaction is not well-defined.  Goal of Therapy:  INR 2.5-3.5   Plan:  Continue his home Coumadin regimen - 2.5mg  on Mondays and Fridays, 5mg  all other days.   Daily INR monitoring while hospitalized, and close INR follow-up after discharge due to addition of Multaq.  Estella Husk, Pharm.D., BCPS, AAHIVP Clinical Pharmacist Phone: [phone] or [phone] 09/19/2012, 3:40 PM

## 2012-09-19 NOTE — Telephone Encounter (Signed)
John [name] is well-known to me. He is a 79 year old gentleman with severe lung disease on home O2. He has a history of aortic stenosis with mechanical aortic valve replacement. He's had paroxysmal atrial fibrillation. He had acute lung toxicity with amiodarone. He had QT prolongation with Tikosyn even at lowest dose. I spoke with his wife yesterday because the patient has gone into atrial fibrillation. His heart rate was up in the 120s. I advised to take an extra bisoprolol yesterday morning and again yesterday evening. She called back this morning he continues with atrial fibrillation and heart rate about 120. She states his breathing is a little worse.  I advised to come to the emergency room for evaluation. We will see him and plan on hospital admission.  John [name] 09/19/2012 11:57 AM

## 2012-09-19 NOTE — ED Notes (Signed)
Pt is seen by [facility] Dr. Tenny Craw is coming to see pt.

## 2012-09-19 NOTE — ED Notes (Signed)
Pt states he noticed his HR was irregular yesterday morning. Pt c/o sob, and nausea. Denies any dizziness or pain. Pt states last month on the 27th he was seen for irregular HR and cardioverted by Dr. Montez Morita. Pt alert and oriented. No distress noted.

## 2012-09-19 NOTE — ED Notes (Signed)
Pt states his heart is out of rhythm starting last night. Pt denies chest pain, but does admit to increase SOB today. Pt uses 2L O2 [place] at home

## 2012-09-19 NOTE — H&P (Addendum)
Patient ID: John [name] MRN: [id number], DOB/AGE: [birth year]   Admit date: 09/19/2012 Date of Consult: @TODAY @  Primary Physician: Illene Regulus, MD Primary Cardiologist: Dayle Points    Problem List: Past Medical History  Diagnosis Date  . Atrial fibrillation   . Aortic valve disorders   . Other primary cardiomyopathies   . Edema     CHRONIC LOWER EXTREMITY  . Unspecified essential hypertension   . Other and unspecified hyperlipidemia   . Heart valve replaced by other means   . Other malaise and fatigue   . Coronary atherosclerosis of native coronary artery   . Acute myocardial infarction, subendocardial infarction, subsequent episode of care   . Unspecified adverse effect of unspecified drug, medicinal and biological substance   . Umbilical hernia without mention of obstruction or gangrene   . Sebaceous cyst   . Labyrinthitis, unspecified   . Personal history of other diseases of digestive system   . Special screening for malignant neoplasm of prostate   . BC (bronchogenic carcinoma)     S/P LOBECTOMY 2011    Past Surgical History  Procedure Laterality Date  . Aortic valve replacement  1994    [facility]  . Appendectomy  [facility]  . Abdominal hernia repair    . Lobectomy  2011  . Cardioversion N/A 08/03/2012    Procedure: CARDIOVERSION;  Surgeon: [name], MD;  Location: [facility] ENDOSCOPY;  Service: Cardiovascular;  Laterality: N/A;     Allergies:  Allergies  Allergen Reactions  . Penicillins     REACTION: rash/hives, facial swelling    HPI:  Patient is a 79 yo who has a history of atrial fibrillation, aortic stenosis (s/p MVR), COPD, NCSCA lung s/p resecction  pulmonary hypertension (followed by Langston Masker) Presents to ER with feeling his heart going fast  Patient has history of atrial fibrillation  Had spell post surgery in 2006  Then another spell  Intolerant to amiodarone (possible lung tox), Tikosyn (prolonged QT) In June he presented to ER  In AFib  Underwent  DCCV.  He was last seen in clinic on July 3.  Felt better. Bisoprolol decreased due to slow HR.    Patient's wife called Dr Excell Seltzer today.  Patient's HR was up to 120s  Took extra bisoprolol yesterday x2.  Breathing was worse.  Felt a little nauseated    Echo in Dec 2013 LVEF was 50 to 55%  AVR working well mean gradient 5 mm hg.  PAP est at 81 mm Hg  Patient says he is feeling pretty good now sitting in bed.  When his heart rate goes up is when he doesn't feel good. Inpatient Medications:   Family History  Problem Relation Age of Onset  . Colon cancer Father   . Aneurysm Mother     RUPTURE-NECK   . Coronary artery disease Mother      History   Social History  . Marital Status: Married    Spouse Name: N/A    Number of Children: 1  . Years of Education: N/A   Occupational History  . [employer]     PHOTOGRAPHER   Social History Main Topics  . Smoking status: Former Smoker -- 1.00 packs/day for 40 years    Types: Cigarettes    Quit date: 02/10/2009  . Smokeless tobacco: Never Used  . Alcohol Use: Yes     Comment: socially "once in a while"  . Drug Use: Yes  . Sexually Active: Not on file   Other Topics  Concern  . Not on file   Social History Narrative   HSG, 2 years college. married '70 . 1 daughter  '70. work: Environmental manager, retired.   ACP - no living will - provided information at today's visit.      Review of Systems: All other systems reviewed and are otherwise negative except as noted above.  Physical Exam: Filed Vitals:   09/19/12 1320  BP: 113/82  Pulse: 102  Temp: 97.6 F (36.4 C)  Resp: 26   No intake or output data in the 24 hours ending 09/19/12 1340  General: Well developed, well nourished, in no acute distress. Head: Normocephalic, atraumatic, sclera non-icteric Neck: Negative for carotid bruits. JVP not elevated. Lungs: Relatively clear.   Heart: Irreg Irreg   Crisp valve sounds Abdomen: Soft, non-tender, non-distended with normoactive bowel  sounds. No hepatomegaly. No rebound/guarding. No obvious abdominal masses. Msk:  Strength and tone appears normal for age. Extremities: No clubbing, cyanosis Triv edema Distal pedal pulses are 2+ and equal bilaterally. Neuro: Alert and oriented X 3. Moves all extremities spontaneously. Psych:  Responds to questions appropriately with a normal affect.  Labs: Results for orders placed during the hospital encounter of 09/19/12 (from the past 24 hour(s))  POCT I-STAT TROPONIN I     Status: None   Collection Time    09/19/12  1:24 PM      Result Value Range   Troponin i, poc 0.02  0.00 - 0.08 ng/mL   Comment 3             Radiology/Studies: No results found.  EKG:  Atrial fibrillation 112 bpm.    ASSESSMENT AND PLAN:  Patient is a 79 yo with a history of PAF  Presents with about 24 hours of noticing his heart rate was up.  At rest he is comfortable  On exam BP 115/ P 100 to 115 (afib)  Lungs:  Rel clear  Dayle Points in ER  Discussed with him  Also reviewed with patient . WIll admit to telemetry to observe HR and symptoms. Discussed with Rosette Reveal  Only real option is Multaq  Would start and follow Keep NPO after MN and if still in Afib cardiovert.    2.  Hx AS s/p AVR.  Continue coumadin  3.  Hx CAD  No symptoms to sugg active angina  4.  Pulm:  Signif history of COPD and pulmonary hypertension  Continue O2.  Volume status looks OK      Signed, Dietrich Pates 09/19/2012, 1:40 PM

## 2012-09-19 NOTE — ED Provider Notes (Addendum)
CSN: [id number]     Arrival date & time 09/19/12  1252 History     First MD Initiated Contact with Patient 09/19/12 1357     Chief Complaint  Patient presents with  . Irregular Heart Beat   (Consider location/radiation/quality/duration/timing/severity/associated sxs/prior Treatment) HPI Comments: 79 year old male who presents emergency Department with chief complaint of palpitations. Patient states his heart rate has been elevating for the past 2 days. He has been in contact with cardiology. Per cardiology request the patient was recommended to come to the emergency department this a.m. Patient has been evaluated by Dr. Tenny Craw cardiology in the emergency department. Per the patient and his family he has had a few episodes of tachycardia during which time he felt mildly nauseous. He otherwise denies chest pain, shortness of breath, vomiting diarrhea abdominal pain, extremity pain, syncope, neurologic deficit.  The history is provided by the patient and the spouse.   Patient has multiple medical problems to include atrial fibrillation, aortic valve disorder, mechanical valve, he is under anticoagulation, history of MI, history of neurogenic carcinoma. Past Medical History  Diagnosis Date  . Atrial fibrillation   . Aortic valve disorders   . Other primary cardiomyopathies   . Edema     CHRONIC LOWER EXTREMITY  . Unspecified essential hypertension   . Other and unspecified hyperlipidemia   . Heart valve replaced by other means   . Other malaise and fatigue   . Coronary atherosclerosis of native coronary artery   . Acute myocardial infarction, subendocardial infarction, subsequent episode of care   . Unspecified adverse effect of unspecified drug, medicinal and biological substance   . Umbilical hernia without mention of obstruction or gangrene   . Sebaceous cyst   . Labyrinthitis, unspecified   . Personal history of other diseases of digestive system   . Special screening for malignant  neoplasm of prostate   . BC (bronchogenic carcinoma)     S/P LOBECTOMY 2011   Past Surgical History  Procedure Laterality Date  . Aortic valve replacement  1994    [facility]  . Appendectomy  [facility]  . Abdominal hernia repair    . Lobectomy  2011  . Cardioversion N/A 08/03/2012    Procedure: CARDIOVERSION;  Surgeon: [name], MD;  Location: [facility] ENDOSCOPY;  Service: Cardiovascular;  Laterality: N/A;   Family History  Problem Relation Age of Onset  . Colon cancer Father   . Aneurysm Mother     RUPTURE-NECK   . Coronary artery disease Mother    History  Substance Use Topics  . Smoking status: Former Smoker -- 1.00 packs/day for 40 years    Types: Cigarettes    Quit date: 02/10/2009  . Smokeless tobacco: Never Used  . Alcohol Use: Yes     Comment: socially "once in a while"    Review of Systems  Constitutional: Negative for activity change and appetite change.  HENT: Negative.  Negative for facial swelling, neck pain, neck stiffness and ear discharge.   Eyes: Negative for pain, discharge and itching.  Respiratory: Negative for apnea, cough, choking and chest tightness.   Cardiovascular: Positive for palpitations. Negative for chest pain and leg swelling.  Gastrointestinal: Positive for nausea. Negative for abdominal pain, abdominal distention and anal bleeding.  Endocrine: Negative.   Genitourinary: Negative.   Allergic/Immunologic: Negative.     Allergies  Penicillins  Home Medications   Current Outpatient Rx  Name  Route  Sig  Dispense  Refill  . acetaminophen (TYLENOL) 500 MG tablet  Oral   Take 1,000 mg by mouth every 6 (six) hours as needed for pain.          Marland Kitchen albuterol (VENTOLIN HFA) 108 (90 BASE) MCG/ACT inhaler   Inhalation   Inhale 2 puffs into the lungs every 4 (four) hours as needed.   1 Inhaler   6   . aspirin 81 MG tablet   Oral   Take 81 mg by mouth daily.           . bisoprolol (ZEBETA) 5 MG tablet   Oral   Take 5 mg by mouth  daily.         . colchicine 0.6 MG tablet   Oral   Take 0.6 mg by mouth daily. 2 tablets at onset and then 1-2 tablets as needed.         . famotidine (PEPCID) 20 MG tablet   Oral   Take 20 mg by mouth daily as needed for heartburn.          . furosemide (LASIX) 40 MG tablet      Take 1 tablet by mouth twice daily   60 tablet   9   . guaiFENesin (MUCINEX) 600 MG 12 hr tablet   Oral   Take 2 tablets (1,200 mg total) by mouth as needed.   120 suppository   11   . lisinopril (PRINIVIL,ZESTRIL) 2.5 MG tablet   Oral   Take 2.5 mg by mouth daily.         . NON FORMULARY      2 L. Oxygen continuous          . potassium chloride SA (K-DUR,KLOR-CON) 20 MEQ tablet   Oral   Take 20 mEq by mouth daily.         Marland Kitchen PREDNISOLONE ACETATE OP   Left Eye   Place 1 drop into the left eye daily.         Marland Kitchen warfarin (COUMADIN) 5 MG tablet   Oral   Take 2.5-5 mg by mouth daily. Monday and Friday 2.5mg .  Tuesday, Wednesday, Thursday, Saturday and Sunday 5mg .         . allopurinol (ZYLOPRIM) 100 MG tablet   Oral   Take 1 tablet (100 mg total) by mouth daily.   30 tablet   6   . ofloxacin (FLOXIN) 0.3 % otic solution      Using one drop qid          BP 100/65  Pulse 63  Temp(Src) 97.6 F (36.4 C) (Oral)  Resp 16  SpO2 94% Physical Exam  Constitutional: He is oriented to person, place, and time. He appears well-developed and well-nourished.  Patient in no acute distress at this time. Denies chest pain, shortness of breath, has normal status, is very pleasant.  HENT:  Head: Normocephalic and atraumatic.  Eyes: Conjunctivae and EOM are normal. Pupils are equal, round, and reactive to light.  Neck: Normal range of motion.  Sebaceous cyst on the back of neck, nontender, no signs of infection.  Cardiovascular:  Murmur heard. Rate approximately 100, rhythm irregular  Pulmonary/Chest: Effort normal and breath sounds normal.  Musculoskeletal: Normal range of  motion.  Neurological: He is alert and oriented to person, place, and time.  Skin: Skin is warm and dry.   Results for orders placed during the hospital encounter of 09/19/12  CBC      Result Value Range   WBC 10.9 (*) 4.0 - 10.5 K/uL   RBC  4.87  4.22 - 5.81 MIL/uL   Hemoglobin 15.6  13.0 - 17.0 g/dL   HCT 45.4  09.8 - 11.9 %   MCV 92.4  78.0 - 100.0 fL   MCH 32.0  26.0 - 34.0 pg   MCHC 34.7  30.0 - 36.0 g/dL   RDW 14.7  82.9 - 56.2 %   Platelets 222  150 - 400 K/uL  BASIC METABOLIC PANEL      Result Value Range   Sodium 137  135 - 145 mEq/L   Potassium 4.5  3.5 - 5.1 mEq/L   Chloride 98  96 - 112 mEq/L   CO2 29  19 - 32 mEq/L   Glucose, Bld 109 (*) 70 - 99 mg/dL   BUN 16  6 - 23 mg/dL   Creatinine, Ser 1.30  0.50 - 1.35 mg/dL   Calcium 86.5  8.4 - 78.4 mg/dL   GFR calc non Af Amer 79 (*) >90 mL/min   GFR calc Af Amer >90  >90 mL/min  PRO B NATRIURETIC PEPTIDE      Result Value Range   Pro B Natriuretic peptide (BNP) 4659.0 (*) 0 - 450 pg/mL  PROTIME-INR      Result Value Range   Prothrombin Time 28.4 (*) 11.6 - 15.2 seconds   INR 2.78 (*) 0.00 - 1.49  POCT I-STAT TROPONIN I      Result Value Range   Troponin i, poc 0.02  0.00 - 0.08 ng/mL   Comment 3              ED Course    Date: 09/19/2012  Rate: 112  Rhythm: Junctional accelerated rhythm w/ premature supraventr complexes  QRS Axis: normal  Intervals: No Pwaves  ST/T Wave abnormalities: nonspecific ST changes and nonspecific T wave changes  Conduction Disutrbances:none  Narrative Interpretation:   Old EKG Reviewed: changes noted  3:15 PM Pt in nad, vss, cardiology consulted.  Family in room.  All aware of plan.     Procedures (including critical care time)  Labs Reviewed  CBC - Abnormal; Notable for the following:    WBC 10.9 (*)    All other components within normal limits  BASIC METABOLIC PANEL - Abnormal; Notable for the following:    Glucose, Bld 109 (*)    GFR calc non Af Amer 79 (*)    All  other components within normal limits  PRO B NATRIURETIC PEPTIDE - Abnormal; Notable for the following:    Pro B Natriuretic peptide (BNP) 4659.0 (*)    All other components within normal limits  PROTIME-INR - Abnormal; Notable for the following:    Prothrombin Time 28.4 (*)    INR 2.78 (*)    All other components within normal limits  POCT I-STAT TROPONIN I   Dg Chest Port 1 View  09/19/2012   *RADIOLOGY REPORT*  Clinical Data: 79 year old male with tachycardia, atrial fibrillation and shortness of breath.  PORTABLE CHEST - 1 VIEW  Comparison: 03/18/2012 and prior chest radiographs  Findings: Cardiomegaly and cardiac surgical changes noted. Right basilar atelectasis/scarring again noted. There may be a very small right pleural effusion present. Minimal interstitial opacities are noted and may represent minimal interstitial pulmonary edema. There is no evidence of pneumothorax.  IMPRESSION: Right basilar atelectasis/scarring again noted with possible small right pleural effusion.  Minimal interstitial opacities - minimal interstitial edema not excluded.   Original Report Authenticated By: Harmon Pier, M.D.   1. Tachycardia     MDM  79 year old male presents to emergency department based on recommendation of cardiology who has seen patient in the emergency department.  Case discussed with Dr. Lucina Mellow cardiology plan to provide very low dose of Lopressor to attempt to control rate. We'll continue to monitor patient in th emergency department and disposition per cardiology.  3:15 PM Admit patient to cardiology service where he will undergo medication to attempt to convert rhythm to sinus. Patient is stable for admission.   Darlys Gales, MD 09/19/12 [phone]

## 2012-09-20 ENCOUNTER — Encounter (HOSPITAL_COMMUNITY): Payer: Self-pay | Admitting: *Deleted

## 2012-09-20 ENCOUNTER — Observation Stay (HOSPITAL_COMMUNITY): Payer: Medicare Other | Admitting: *Deleted

## 2012-09-20 ENCOUNTER — Encounter (HOSPITAL_COMMUNITY)
Admission: EM | Disposition: A | Discharge status: Home / Self Care | Payer: Self-pay | Source: Home / Self Care | Attending: Emergency Medicine

## 2012-09-20 DIAGNOSIS — I4891 Unspecified atrial fibrillation: Secondary | ICD-10-CM

## 2012-09-20 HISTORY — PX: CARDIOVERSION: SHX1299

## 2012-09-20 LAB — BASIC METABOLIC PANEL
BUN: 19 mg/dL (ref 6–23)
Chloride: 102 mEq/L (ref 96–112)
GFR calc non Af Amer: 63 mL/min — ABNORMAL LOW (ref >90)
Glucose, Bld: 79 mg/dL (ref 70–99)
Potassium: 4.5 mEq/L (ref 3.5–5.1)
Sodium: 139 mEq/L (ref 135–145)

## 2012-09-20 LAB — PROTIME-INR: INR: 3.15 — ABNORMAL HIGH (ref 0.00–1.49)

## 2012-09-20 SURGERY — CARDIOVERSION
Anesthesia: Monitor Anesthesia Care | Wound class: Clean

## 2012-09-20 MED ORDER — SODIUM CHLORIDE 0.9 % IV SOLN: 250.0000 mL | INTRAVENOUS | Status: DC

## 2012-09-20 MED ORDER — SODIUM CHLORIDE 0.9 % IJ SOLN
3.0000 mL | Freq: Two times a day (BID) | INTRAMUSCULAR | Status: DC
  Administered 2012-09-20 – 2012-09-21 (×3): 3 mL via INTRAVENOUS

## 2012-09-20 MED ORDER — PROPOFOL 10 MG/ML IV BOLUS
INTRAVENOUS | Status: DC | PRN
  Administered 2012-09-20: 70 mg via INTRAVENOUS

## 2012-09-20 MED ORDER — SODIUM CHLORIDE 0.9 % IJ SOLN: 3.0000 mL | INTRAMUSCULAR | Status: DC | PRN

## 2012-09-20 NOTE — Anesthesia Postprocedure Evaluation (Signed)
  Anesthesia Post-op Note  Patient: John [name]  Procedure(s) Performed: Procedure(s): CARDIOVERSION (N/A)  Patient Location: Nursing Unit  Anesthesia Type:MAC  Level of Consciousness: awake, alert  and oriented  Airway and Oxygen Therapy: Patient Spontanous Breathing and Patient connected to nasal cannula oxygen  Post-op Pain: none  Post-op Assessment: Post-op Vital signs reviewed and Patient's Cardiovascular Status Stable  Post-op Vital Signs: Reviewed and stable  Complications: No apparent anesthesia complications

## 2012-09-20 NOTE — Preoperative (Signed)
Beta Blockers   Reason not to administer Beta Blockers:Not Applicable 

## 2012-09-20 NOTE — Progress Notes (Signed)
Utilization review completed.  

## 2012-09-20 NOTE — Transfer of Care (Signed)
Immediate Anesthesia Transfer of Care Note  Patient: John [name]  Procedure(s) Performed: Procedure(s): CARDIOVERSION (N/A)  Patient Location: Nursing Unit  Anesthesia Type:MAC  Level of Consciousness: awake, alert  and oriented  Airway & Oxygen Therapy: Patient Spontanous Breathing and Patient connected to nasal cannula oxygen  Post-op Assessment: Report given to PACU RN and Post -op Vital signs reviewed and stable  Post vital signs: Reviewed and stable  Complications: No apparent anesthesia complications

## 2012-09-20 NOTE — Interval H&P Note (Signed)
History and Physical Interval Note:  09/20/2012 9:25 AM  John [name]  has presented today for surgery, with the diagnosis of Afib  The various methods of treatment have been discussed with the patient and family. After consideration of risks, benefits and other options for treatment, the patient has consented to  Procedure(s): CARDIOVERSION (N/A) as a surgical intervention .  The patient's history has been reviewed, patient examined, no change in status, stable for surgery.  I have reviewed the patient's chart and labs.  Questions were answered to the patient's satisfaction.     Leonia Reeves.D.

## 2012-09-20 NOTE — Progress Notes (Signed)
ANTICOAGULATION CONSULT NOTE - Follow Up Consult  Pharmacy Consult for Coumadin Indication: atrial fibrillation and mechanic AVR  Allergies  Allergen Reactions  . Penicillins     REACTION: rash/hives, facial swelling    Patient Measurements: Height: 6' (182.9 cm) Weight: 166 lb 10.7 oz (75.6 kg) IBW/kg (Calculated) : 77.6  Vital Signs: Temp: 97.7 F (36.5 C) (08/11 0435) Temp src: Oral (08/11 0435) BP: 104/65 mmHg (08/11 0435) Pulse Rate: 74 (08/11 0435)  Labs:  Recent Labs  09/19/12 1320 09/20/12 0348  HGB 15.6  --   HCT 45.0  --   PLT 222  --   LABPROT 28.4* 31.2*  INR 2.78* 3.15*  CREATININE 0.93 1.09    Estimated Creatinine Clearance: 60.7 ml/min (by C-G formula based on Cr of 1.09).  Assessment: 79 year old male on chronic anticoagulation with Coumadin for atrial fibrillation and [facility] AVR. His INR is therapeutic on his home Coumadin regimen. He is starting Multaq (dronaderone) which has the potential to interact with Coumadin, however the extent and timing of this interaction is not well-defined. Will watch trend. He is s/p DCCV this morning. No bleeding noted, CBC stable.  Goal of Therapy:  INR 2.5-3.5   Plan:  -Coumadin 2.5mg  Mon and Fri, 5mg  all other days -INR daily -Monitor for signs/symptoms of bleeding  [facility], [address].D., BCPS Clinical Pharmacist Pager: [phone] 09/20/2012 10:16 AM

## 2012-09-20 NOTE — Anesthesia Preprocedure Evaluation (Addendum)
Anesthesia Evaluation  Patient identified by MRN, date of birth, ID band Patient awake    Reviewed: Allergy & Precautions, H&P , NPO status , Patient's Chart, lab work & pertinent test results, reviewed documented beta blocker date and time   History of Anesthesia Complications Negative for: history of anesthetic complications  Airway Mallampati: II  Neck ROM: Full    Dental  (+) Dental Advisory Given and Upper Dentures   Pulmonary shortness of breath, pneumonia -, resolved, COPD oxygen dependent,  breath sounds clear to auscultation        Cardiovascular hypertension, Pt. on medications + CAD and + Past MI + dysrhythmias Atrial Fibrillation Rhythm:Irregular     Neuro/Psych    GI/Hepatic Neg liver ROS, GERD-  ,  Endo/Other  negative endocrine ROS  Renal/GU negative Renal ROS     Musculoskeletal   Abdominal   Peds  Hematology negative hematology ROS (+)   Anesthesia Other Findings   Reproductive/Obstetrics                          Anesthesia Physical Anesthesia Plan  ASA: III  Anesthesia Plan: General   Post-op Pain Management:    Induction: Intravenous  Airway Management Planned: Mask  Additional Equipment:   Intra-op Plan:   Post-operative Plan:   Informed Consent: I have reviewed the patients History and Physical, chart, labs and discussed the procedure including the risks, benefits and alternatives for the proposed anesthesia with the patient or authorized representative who has indicated his/her understanding and acceptance.   Dental advisory given  Plan Discussed with: CRNA, Anesthesiologist and Surgeon  Anesthesia Plan Comments:         Anesthesia Quick Evaluation

## 2012-09-20 NOTE — CV Procedure (Signed)
EP Procedure Note  Procedure: DCCV  Indication: symptomatic atrial fibrillation  Findings: After informed consent was obtained, the patient was prepped in the usual manner with the electro-dispersive pad placed in the antero-posterior position. Dr. Krista Blue with the anesthesia service provided sedation with IV Propofol. 200 J of synchronized biphasic energy was applied, restoring NSR. The patient tolerated the procedure well.   Complications: none immediately  Results: successful DCCV with 200 J of synchronized biphasic energy, restoring NSR.  John [name].D.

## 2012-09-21 ENCOUNTER — Encounter (HOSPITAL_COMMUNITY): Payer: Self-pay | Admitting: Nurse Practitioner

## 2012-09-21 DIAGNOSIS — I359 Nonrheumatic aortic valve disorder, unspecified: Secondary | ICD-10-CM

## 2012-09-21 DIAGNOSIS — I4819 Other persistent atrial fibrillation: Secondary | ICD-10-CM

## 2012-09-21 DIAGNOSIS — I2510 Atherosclerotic heart disease of native coronary artery without angina pectoris: Secondary | ICD-10-CM

## 2012-09-21 DIAGNOSIS — I4891 Unspecified atrial fibrillation: Secondary | ICD-10-CM

## 2012-09-21 DIAGNOSIS — J449 Chronic obstructive pulmonary disease, unspecified: Secondary | ICD-10-CM

## 2012-09-21 DIAGNOSIS — I272 Pulmonary hypertension, unspecified: Secondary | ICD-10-CM

## 2012-09-21 MED ORDER — WARFARIN SODIUM 5 MG PO TABS: ORAL_TABLET | ORAL | Status: DC

## 2012-09-21 MED ORDER — DRONEDARONE HCL 400 MG PO TABS: 400.0000 mg | ORAL_TABLET | Freq: Two times a day (BID) | ORAL | Status: DC

## 2012-09-21 MED ORDER — WARFARIN SODIUM 2.5 MG PO TABS
2.5000 mg | ORAL_TABLET | ORAL | Status: DC
  Filled 2012-09-21: qty 1

## 2012-09-21 MED ORDER — WARFARIN SODIUM 5 MG PO TABS: 5.0000 mg | ORAL_TABLET | ORAL | Status: DC

## 2012-09-21 NOTE — Discharge Summary (Signed)
Patient ID: John [name],  MRN: [id number], DOB/AGE: Jul 17, 1933 79 y.o.  Admit date: 09/19/2012 Discharge date: 09/21/2012  Primary Care Provider: Illene [name] Primary Cardiologist: John Petit. Excell Seltzer, MD   Discharge Diagnoses Principal Problem:   PAF (paroxysmal atrial fibrillation)  **s/p DCCV and initiation of dronedarone this admission.  Active Problems:   COPD - home O2.   PROSTHETIC VALVE-MECHANICAL (Aortic)   Long term (current) use of anticoagulants   Pulmonary hypertension   HYPERTENSION   CORONARY ATHEROSCLEROSIS NATIVE CORONARY ARTERY   DYSLIPIDEMIA  Allergies Allergies  Allergen Reactions  . Penicillins     REACTION: rash/hives, facial swelling   Procedures  DCCV 09/20/2012  Successful cardioversion with 200 J of synchronized biphasic energy, restoring sinus rhythm.  History of Present Illness  79 y/o male with h/o aortic stenosis s/p AVR, COPD on home O2, and PAF s/p DCCV in 07/2012.  He did well following cardioversion in June and wa sin his USOH until the morning of admission when he noted tachypalpitations with HR's in the 120's.  He took 2 extra bisoprolol tabs w/o change in HR and dyspnea worsened.  As a result, he presented to the [facility] ED where he was found to be in afib with rates in the low 100's.  Decision was made to admit him for further evaluation and initiation of antiarrhythmic therapy.  Hospital Course  Following admission, after discussion with electrophysiology, John [name] was initiated on dronedarone 400mg  BID.  His INR was therapeutic and has been therapeutic dating back several months.  On 8/11, he underwent successful DCCV with 1, 200 Joule delivered energy.  Since cardioversion, he has maintained sinus rhythm and will be discharged home today in good condition.  Discharge Vitals Blood pressure 105/59, pulse 53, temperature 97.9 F (36.6 C), temperature source Oral, resp. rate 18, height 6' (1.829 m), weight 166 lb 10.7 oz (75.6 kg), SpO2 98.00%.    Filed Weights   09/19/12 1541  Weight: 166 lb 10.7 oz (75.6 kg)   Labs  CBC  Recent Labs  09/19/12 1320  WBC 10.9*  HGB 15.6  HCT 45.0  MCV 92.4  PLT 222   Basic Metabolic Panel  Recent Labs  09/19/12 1320 09/20/12 0348  NA 137 139  K 4.5 4.5  CL 98 102  CO2 29 27  GLUCOSE 109* 79  BUN 16 19  CREATININE 0.93 1.09  CALCIUM 10.0 9.1   Thyroid Function Tests  Recent Labs  09/19/12 1557  TSH 0.195*   Disposition  Pt is being discharged home today in good condition.  Follow-up Plans & Appointments      Follow-up Information   Follow up with John [name] On 09/29/2012. (10:30 AM)    Contact information:   [address] [place] [address] [phone]      Follow up with John Duff, PA-C On 10/12/2012. (11:30 AM)    Contact information:   [address] [place] [address] [phone]      Discharge Medications    Medication List         acetaminophen 500 MG tablet  Commonly known as:  TYLENOL  Take 1,000 mg by mouth every 6 (six) hours as needed for pain.     albuterol 108 (90 BASE) MCG/ACT inhaler  Commonly known as:  VENTOLIN HFA  Inhale 2 puffs into the lungs every 4 (four) hours as needed.     allopurinol 100 MG tablet  Commonly known as:  ZYLOPRIM  Take 1 tablet (100 mg total) by mouth daily.     aspirin 81 MG tablet  Take 81 mg by mouth daily.     bisoprolol 5 MG tablet  Commonly known as:  ZEBETA  Take 5 mg by mouth daily.     colchicine 0.6 MG tablet  Take 0.6 mg by mouth daily. 2 tablets at onset and then 1-2 tablets as needed.     dronedarone 400 MG tablet  Commonly known as:  MULTAQ  Take 1 tablet (400 mg total) by mouth 2 (two) times daily with a meal.     famotidine 20 MG tablet  Commonly known as:  PEPCID  Take 20 mg by mouth daily as needed for heartburn.     furosemide 40 MG tablet  Commonly known as:  LASIX  Take 1 tablet by mouth twice daily      guaiFENesin 600 MG 12 hr tablet  Commonly known as:  MUCINEX  Take 2 tablets (1,200 mg total) by mouth as needed.     lisinopril 2.5 MG tablet  Commonly known as:  PRINIVIL,ZESTRIL  Take 2.5 mg by mouth daily.     NON FORMULARY  2 L. Oxygen continuous     ofloxacin 0.3 % otic solution  Commonly known as:  FLOXIN  Using one drop qid     potassium chloride SA 20 MEQ tablet  Commonly known as:  K-DUR,KLOR-CON  Take 20 mEq by mouth daily.     PREDNISOLONE ACETATE OP  Place 1 drop into the left eye daily.     warfarin 5 MG tablet  Commonly known as:  COUMADIN  5mg  on Mon, Wed, and Friday;  2.5 mg on Sunday, Tuesday, Thursday, and Saturday.      Outstanding Labs/Studies  None  Duration of Discharge Encounter   Greater than 30 minutes including physician time.  Signed, John Ducking NP 09/21/2012, 11:15 AM

## 2012-09-21 NOTE — Progress Notes (Signed)
D/c orders received;IV removed with gauze on, pt remains in stable condition, pt meds and instructions reviewed and given to pt; pt d/c to home with home oxygen

## 2012-09-21 NOTE — Progress Notes (Signed)
ANTICOAGULATION CONSULT NOTE - Follow Up Consult  Pharmacy Consult for Coumadin Indication: atrial fibrillation and mechanic AVR  Allergies  Allergen Reactions  . Penicillins     REACTION: rash/hives, facial swelling    Patient Measurements: Height: 6' (182.9 cm) Weight: 166 lb 10.7 oz (75.6 kg) IBW/kg (Calculated) : 77.6  Vital Signs: Temp: 97.9 F (36.6 C) (08/12 0555) Temp src: Oral (08/12 0555) BP: 105/59 mmHg (08/12 0959) Pulse Rate: 53 (08/12 0959)  Labs:  Recent Labs  09/19/12 1320 09/20/12 0348 09/21/12 0350  HGB 15.6  --   --   HCT 45.0  --   --   PLT 222  --   --   LABPROT 28.4* 31.2* 33.8*  INR 2.78* 3.15* 3.50*  CREATININE 0.93 1.09  --     Estimated Creatinine Clearance: 60.7 ml/min (by C-G formula based on Cr of 1.09).  Assessment: 79 year old male on chronic anticoagulation with Coumadin for atrial fibrillation and St. Jude's AVR. His INR is therapeutic on the upper end of goal on his home Coumadin regimen but it is trending up. He started Multaq (dronaderone) which has the potential to interact with Coumadin, however the extent and timing of this interaction is not well-defined. No bleeding noted, CBC stable. Home regimen is 2.5 mg Mon and Fri, 5 mg all other days. Will empirically decrease weekly dose by ~15% which would be 2.5 mg daily except 5 mg MWF.  Goal of Therapy:  INR 2.5-3.5   Plan:  -Coumadin 2.5 mg daily x 5 mg MWF -INR daily -Monitor for signs/symptoms of bleeding  [facility], [address].D., BCPS Clinical Pharmacist Pager: [phone] 09/21/2012 10:08 AM

## 2012-09-21 NOTE — Progress Notes (Signed)
   TELEMETRY: Reviewed telemetry pt in NSR, occ PACs: Filed Vitals:   09/20/12 1550 09/20/12 2124 09/21/12 0555 09/21/12 0816  BP: 97/56 98/67 107/59 106/60  Pulse: 74 53 61 61  Temp: 97.7 F (36.5 C) 97.8 F (36.6 C) 97.9 F (36.6 C)   TempSrc: Oral Oral Oral   Resp: 18 18 18    Height:      Weight:      SpO2: 98% 100% 98%     Intake/Output Summary (Last 24 hours) at 09/21/12 0838 Last data filed at 09/20/12 2113  Gross per 24 hour  Intake     28 ml  Output      0 ml  Net     28 ml    SUBJECTIVE Feels well. Breathing is better. No dizzyness or chest pain.  LABS: Basic Metabolic Panel:  Recent Labs  16/10/96 1320 09/20/12 0348  NA 137 139  K 4.5 4.5  CL 98 102  CO2 29 27  GLUCOSE 109* 79  BUN 16 19  CREATININE 0.93 1.09  CALCIUM 10.0 9.1   CBC:  Recent Labs  09/19/12 1320  WBC 10.9*  HGB 15.6  HCT 45.0  MCV 92.4  PLT 222   Thyroid Function Tests:  Recent Labs  09/19/12 1557  TSH 0.195*   Radiology/Studies:  Dg Chest Port 1 View  09/19/2012   *RADIOLOGY REPORT*  Clinical Data: 79 year old male with tachycardia, atrial fibrillation and shortness of breath.  PORTABLE CHEST - 1 VIEW  Comparison: 03/18/2012 and prior chest radiographs  Findings: Cardiomegaly and cardiac surgical changes noted. Right basilar atelectasis/scarring again noted. There may be a very small right pleural effusion present. Minimal interstitial opacities are noted and may represent minimal interstitial pulmonary edema. There is no evidence of pneumothorax.  IMPRESSION: Right basilar atelectasis/scarring again noted with possible small right pleural effusion.  Minimal interstitial opacities - minimal interstitial edema not excluded.   Original Report Authenticated By: Harmon Pier, M.D.   Ecg post DCCV 8/11- NSR, nonspecific ST abnormality.  PHYSICAL EXAM General: Well developed, well nourished, in no acute distress. Head: Normal Neck: Negative for carotid bruits. JVD not  elevated. Lungs: Clear bilaterally to auscultation without wheezes, rales, or rhonchi. Breathing is unlabored. Heart: RRR S1 S2 with good mechanical AV click. Abdomen: Soft, non-tender, non-distended with normoactive bowel sounds. No hepatomegaly. No obvious abdominal masses. Msk:  Strength and tone appears normal for age. Extremities: No clubbing, cyanosis or edema.  Distal pedal pulses are 2+ and equal bilaterally. Neuro: Alert and oriented X 3. Moves all extremities spontaneously. Psych:  Responds to questions appropriately with a normal affect.  ASSESSMENT AND PLAN: 1. Atrial fibrillation with RVR. Now s/p DCCV. Started on Multaq to try and maintain NSR. On chronic coumadin. Plan DC home today. 2. S/p mechanical AVR. 3. CAD-asymptomatic. 4. COPD with severe pulmonary HTN. On home oxygen. 5. NCSCA lung s/p resection.  Principal Problem:   Atrial fibrillation Active Problems:   CORONARY ATHEROSCLEROSIS NATIVE CORONARY ARTERY   COPD   PROSTHETIC VALVE-MECHANICAL   Long term (current) use of anticoagulants    Signed, Prairie Stenberg [place] MD,FACC 09/21/2012 8:38 AM

## 2012-09-22 NOTE — Discharge Summary (Signed)
Patient seen and examined and history reviewed. Agree with above findings and plan. See earlier rounding note.  Theron Arista JordanMD 09/22/2012 10:10 AM

## 2012-09-27 ENCOUNTER — Telehealth: Payer: Self-pay | Admitting: Cardiovascular Disease

## 2012-09-27 NOTE — Telephone Encounter (Signed)
Per Dr Excell Seltzer the pt should DISCONTINUE Bisoprolol and continue to monitor pulse. The pt is having about 5 bouts of diarrhea per day since starting Multaq.  At this time we would like to try and keep the pt on Multaq.  The pt will try Imodium OTC for symptoms. They will contact the office with any other questions or concerns.

## 2012-09-27 NOTE — Telephone Encounter (Signed)
New Problem  Pt had cardioversion/// pulse rate is low high 40- lower 50's and is suffering diarrhea .. believes that the [facility] is working but he is experiencing a little side effects.

## 2012-09-29 ENCOUNTER — Ambulatory Visit (INDEPENDENT_AMBULATORY_CARE_PROVIDER_SITE_OTHER): Payer: Medicare Other | Admitting: *Deleted

## 2012-09-29 DIAGNOSIS — Z954 Presence of other heart-valve replacement: Secondary | ICD-10-CM

## 2012-09-29 DIAGNOSIS — I359 Nonrheumatic aortic valve disorder, unspecified: Secondary | ICD-10-CM

## 2012-09-29 DIAGNOSIS — I4891 Unspecified atrial fibrillation: Secondary | ICD-10-CM

## 2012-09-29 DIAGNOSIS — Z7901 Long term (current) use of anticoagulants: Secondary | ICD-10-CM

## 2012-09-29 MED ORDER — WARFARIN SODIUM 5 MG PO TABS: ORAL_TABLET | ORAL | Status: DC

## 2012-10-04 ENCOUNTER — Telehealth: Payer: Self-pay | Admitting: Cardiovascular Disease

## 2012-10-04 NOTE — Telephone Encounter (Signed)
Pt called because he has been having episodes of high heart rate. Pt states has a pulse oximeter and it shows his  heart rate ranging from 100 to 108 saturation of 96 % now, pt states that a 2:00 Pm today,  his heart rate was 116 to 120 beats/minute and he got SOB. Pt has a cardioversion about 2 weeks ago. He is on multag 400 mg twice a day. Pt states just took an extra Metoprolol for his elevated heart rate. Pt would like to be seen now because he can't wait until 10/15/12 . Pt was made aware that now is too late for him to be seen this  Office. Pt was made aware that  if his symptoms get worse, he needs to go to the ER. Pt states " I guess I did not need to call you did I, BYE" and he hug the phone. Pt was upset because he could not be seen in this office today. Pt called at 4:23 Pm. Which I told him it was too late to be see a pt at this time.

## 2012-10-04 NOTE — Telephone Encounter (Signed)
Spoke to John [name] this evening. Advised to take bisoprolol once daily at previous dose and monitor heart rate. If rate consistently greater than 90 bpm he will take twice daily. He doesn't feel bad. Please add him on to my clinic Friday - I advised we would call him tomorrow.

## 2012-10-04 NOTE — Telephone Encounter (Signed)
New problem   Pt has irregular heartbeat and need to speak to nurse concerning this matter. Please call pt's wife John [name]

## 2012-10-04 NOTE — Telephone Encounter (Signed)
Called pt to move brooke appt from 10-12-12 and pt explained he is having irregular heartbeat and need to rs appt to 10-15-12, pt doesn't think he can wait that long

## 2012-10-05 NOTE — Telephone Encounter (Signed)
I spoke with the pt's wife and the pt is doing okay this morning. The pt's pulse is lower this morning and he took a Bisprolol already with his morning medications.  I have scheduled the pt to see Dr Excell Seltzer on 10/08/12.

## 2012-10-07 ENCOUNTER — Ambulatory Visit: Payer: Medicare Other | Admitting: Emergency Medicine

## 2012-10-08 ENCOUNTER — Other Ambulatory Visit: Payer: Self-pay | Admitting: *Deleted

## 2012-10-08 ENCOUNTER — Ambulatory Visit (INDEPENDENT_AMBULATORY_CARE_PROVIDER_SITE_OTHER): Payer: Medicare Other

## 2012-10-08 ENCOUNTER — Ambulatory Visit (INDEPENDENT_AMBULATORY_CARE_PROVIDER_SITE_OTHER): Payer: Medicare Other | Admitting: Cardiovascular Disease

## 2012-10-08 ENCOUNTER — Encounter: Payer: Self-pay | Admitting: Cardiovascular Disease

## 2012-10-08 VITALS — BP 132/76 | HR 67 | Ht 72.0 in | Wt 168.0 lb

## 2012-10-08 DIAGNOSIS — I359 Nonrheumatic aortic valve disorder, unspecified: Secondary | ICD-10-CM

## 2012-10-08 DIAGNOSIS — I4891 Unspecified atrial fibrillation: Secondary | ICD-10-CM

## 2012-10-08 DIAGNOSIS — Z7901 Long term (current) use of anticoagulants: Secondary | ICD-10-CM

## 2012-10-08 DIAGNOSIS — Z954 Presence of other heart-valve replacement: Secondary | ICD-10-CM

## 2012-10-08 DIAGNOSIS — I2510 Atherosclerotic heart disease of native coronary artery without angina pectoris: Secondary | ICD-10-CM

## 2012-10-08 MED ORDER — METOPROLOL TARTRATE 25 MG PO TABS: ORAL_TABLET | ORAL | Status: DC

## 2012-10-08 NOTE — Patient Instructions (Addendum)
Your physician has recommended you make the following change in your medication: STOP Bisoprolol, START Metoprolol Tartrate 25mg  take one-half tablet by mouth twice a day  Your physician has recommended that you wear a 48 holter monitor. Holter monitors are medical devices that record the heart's electrical activity. Doctors most often use these monitors to diagnose arrhythmias. Arrhythmias are problems with the speed or rhythm of the heartbeat. The monitor is a small, portable device. You can wear one while you do your normal daily activities. This is usually used to diagnose what is causing palpitations/syncope (passing out).  You have been referred to Dr Ladona Ridgel for further discussion of Atrial Fibrillation and possible Pacemaker  Your physician recommends that you schedule a follow-up appointment in: 3 MONTHS with Dr Excell Seltzer

## 2012-10-08 NOTE — Progress Notes (Signed)
HPI:   79 year old gentleman presenting for followup evaluation. The patient is followed for atrial fibrillation and aortic valve disease status post remote mechanical St. Jude aortic valve replacement in 1994. He's had non-small cell lung cancer with curative resection several years ago. He's been O2 dependent since that time. Despite that, he remains functional and independent.  He's had a good bit of difficulty with atrial fibrillation. He had concerns for lung toxicity while on amiodarone with acute on chronic respiratory failure, prolonged QT even with low doses of dofetilide, and more recently has been placed on Multaq. He underwent cardioversion just a few weeks ago but has gone back into atrial fibrillation. He has a monitor at home that he uses with his pulse ox. His heart rates have been over the place with readings in the 30s at rest, but heart rates up into the 130s with low-level activity. He has not had chest pain, change in his chronic shortness of breath, orthopnea, or PND. He does have mild leg swelling but this is been chronic. He feels fatigued. He's had some nausea and weight loss.  Outpatient Encounter Prescriptions as of 10/08/2012  Medication Sig Dispense Refill  . acetaminophen (TYLENOL) 500 MG tablet Take 1,000 mg by mouth every 6 (six) hours as needed for pain.       Marland Kitchen albuterol (VENTOLIN HFA) 108 (90 BASE) MCG/ACT inhaler Inhale 2 puffs into the lungs every 4 (four) hours as needed.  1 Inhaler  6  . aspirin 81 MG tablet Take 81 mg by mouth daily.        . bisoprolol (ZEBETA) 5 MG tablet TAKE ONE TABLET DAILY      . dronedarone (MULTAQ) 400 MG tablet Take 1 tablet (400 mg total) by mouth 2 (two) times daily with a meal.  60 tablet  6  . famotidine (PEPCID) 20 MG tablet Take 20 mg by mouth daily as needed for heartburn.       . furosemide (LASIX) 40 MG tablet Take 1 tablet by mouth twice daily  60 tablet  9  . guaiFENesin (MUCINEX) 600 MG 12 hr tablet Take 2 tablets (1,200  mg total) by mouth as needed.  120 suppository  11  . lisinopril (PRINIVIL,ZESTRIL) 2.5 MG tablet Take 2.5 mg by mouth daily.      . NON FORMULARY 2 L. Oxygen continuous       . potassium chloride SA (K-DUR,KLOR-CON) 20 MEQ tablet Take 20 mEq by mouth daily.      Marland Kitchen warfarin (COUMADIN) 2.5 MG tablet TAKE ON Monday,WEDNESDAY AND Friday AS DIRECTED BY COUMADIN CLINIC      . warfarin (COUMADIN) 5 MG tablet Take as directed by coumadin clinic  60 tablet  1  . [DISCONTINUED] allopurinol (ZYLOPRIM) 100 MG tablet Take 1 tablet (100 mg total) by mouth daily.  30 tablet  6  . [DISCONTINUED] colchicine 0.6 MG tablet Take 0.6 mg by mouth daily. 2 tablets at onset and then 1-2 tablets as needed.      . [DISCONTINUED] ofloxacin (FLOXIN) 0.3 % otic solution Using one drop qid      . [DISCONTINUED] PREDNISOLONE ACETATE OP Place 1 drop into the left eye daily.       No facility-administered encounter medications on file as of 10/08/2012.    Allergies  Allergen Reactions  . Penicillins     REACTION: rash/hives, facial swelling    Past Medical History  Diagnosis Date  . PAF (paroxysmal atrial fibrillation)  a. intolerant to amio (? lung toxicity) and tikosyn (prolonged QT);  b. 07/2012 s/p DCCV;  c. 09/2012 s/p DCCV - Multaq initiated.  . Aortic valve disorders     a. s/p AVR, 2006;  b. chronic coumadin.  . Other primary cardiomyopathies   . Edema     CHRONIC LOWER EXTREMITY  . Unspecified essential hypertension   . Other and unspecified hyperlipidemia   . Heart valve replaced by other means   . Other malaise and fatigue   . Coronary atherosclerosis of native coronary artery   . Acute myocardial infarction, subendocardial infarction, subsequent episode of care   . Unspecified adverse effect of unspecified drug, medicinal and biological substance   . Umbilical hernia without mention of obstruction or gangrene   . Sebaceous cyst   . Labyrinthitis, unspecified   . Personal history of other diseases  of digestive system   . Special screening for malignant neoplasm of prostate   . BC (bronchogenic carcinoma)     S/P LOBECTOMY 2011  . Shortness of breath   . COPD (chronic obstructive pulmonary disease)   . GERD (gastroesophageal reflux disease)     ROS: Negative except as per HPI  BP 132/76  Pulse 67  Ht 6' (1.829 m)  Wt 168 lb (76.204 kg)  BMI 22.78 kg/m2  SpO2 94%  PHYSICAL EXAM: Pt is alert and oriented, NAD HEENT: normal Neck: JVP - normal, carotids 2+= without bruits Lungs: CTA bilaterally CV: Irregularly irregular with normal mechanical aortic closure sounds Abd: soft, NT, Positive BS, no hepatomegaly Ext: Trace to 1+ edema bilaterally Skin: warm/dry no rash  EKG:  Atrial fibrillation 67 beats per minute otherwise within normal limits.  ASSESSMENT AND PLAN: 1. Atrial fibrillation. The patient is back in atrial fibrillation despite recent cardioversion and use of antiarrhythmic drug therapy with Multaq. He is taking this appropriately with meals. Heart rate has been erratic with rates as low as the 30s and as high as the 130s. I've asked him to stop bisoprolol and start metoprolol 12.5 mg twice daily. I am going to refer him back to Dr. Ladona Ridgel for consideration of pacing, radiofrequency ablation, or continued medical therapy. I have recommended a 48-hour Holter monitor before he sees Dr. Ladona Ridgel so that we have good data on his rhythm as I'm not completely confident in the pulse oximeter readings that he obtains at home. He remains on systemic anticoagulation with warfarin.  2. Aortic valve disease status post mechanical aortic valve replacement. Exam remained stable with crisp mechanical heart sounds. Echocardiography has shown stable prosthetic valvular function.  3. Pulmonary hypertension. Likely related to severe lung disease and chronic hypoxemia. He remains on O2.  I will see him back in 3 months for followup. He will see Dr. Ladona Ridgel for EP evaluation in the  interim.  [name] 10/08/2012 2:15 PM

## 2012-10-12 ENCOUNTER — Encounter (INDEPENDENT_AMBULATORY_CARE_PROVIDER_SITE_OTHER): Payer: Medicare Other

## 2012-10-12 ENCOUNTER — Encounter: Payer: Medicare Other | Admitting: Cardiology

## 2012-10-12 ENCOUNTER — Telehealth: Payer: Self-pay | Admitting: *Deleted

## 2012-10-12 DIAGNOSIS — I359 Nonrheumatic aortic valve disorder, unspecified: Secondary | ICD-10-CM

## 2012-10-12 DIAGNOSIS — I4891 Unspecified atrial fibrillation: Secondary | ICD-10-CM

## 2012-10-12 DIAGNOSIS — I2510 Atherosclerotic heart disease of native coronary artery without angina pectoris: Secondary | ICD-10-CM

## 2012-10-12 NOTE — Telephone Encounter (Signed)
48 hr holter monitor placed on Pt 10/12/12 TK

## 2012-10-19 ENCOUNTER — Other Ambulatory Visit: Payer: Self-pay

## 2012-10-20 ENCOUNTER — Telehealth: Payer: Self-pay | Admitting: Cardiovascular Disease

## 2012-10-20 DIAGNOSIS — I2510 Atherosclerotic heart disease of native coronary artery without angina pectoris: Secondary | ICD-10-CM

## 2012-10-20 DIAGNOSIS — I359 Nonrheumatic aortic valve disorder, unspecified: Secondary | ICD-10-CM

## 2012-10-20 DIAGNOSIS — I4891 Unspecified atrial fibrillation: Secondary | ICD-10-CM

## 2012-10-20 MED ORDER — METOPROLOL TARTRATE 25 MG PO TABS: ORAL_TABLET | ORAL | Status: DC

## 2012-10-20 NOTE — Telephone Encounter (Signed)
Had lung surgery and was taking off  Metoprolol.  Just this week,  Dr. Excell Seltzer put me back on this med and the pharmarcy suggested that I should not take it.  Need advice to see what I should take.

## 2012-10-20 NOTE — Telephone Encounter (Signed)
Spoke with patient who states when he went to pick up his Rx at the pharmacy, the pharmacist discussed the difference between Bisoprolol and Metoprolol with patient and suggested patient should take Bisoprolol and advised him on how to break his 5 mg tab in half in order to take 2.5 mg.  Patient states he has been taking Metoprolol 12.5 mg BID and his heart rate remains elevated.  Patient states he wants to take whatever will make his heart rate go down.  I advised patient that I will discuss with Dr. Excell Seltzer since he is in the office today.   Dr. Excell Seltzer advised that he prefers patient remain on Metoprolol for rate control.  I advised patient who then stated that he wants to take more Metoprolol since the 12.5 mg BID is not helping his heart rate come down.  I went back to Dr. Excell Seltzer who advised that patient can take Metoprolol 25 mg BID.  I made the medication change in EPIC and advised patient who verbalized understanding.

## 2012-10-28 ENCOUNTER — Ambulatory Visit (INDEPENDENT_AMBULATORY_CARE_PROVIDER_SITE_OTHER): Payer: Medicare Other | Admitting: Internal Medicine

## 2012-10-28 ENCOUNTER — Encounter: Payer: Self-pay | Admitting: Internal Medicine

## 2012-10-28 ENCOUNTER — Ambulatory Visit (INDEPENDENT_AMBULATORY_CARE_PROVIDER_SITE_OTHER): Payer: Medicare Other | Admitting: Pharmacist

## 2012-10-28 VITALS — BP 111/73 | HR 59 | Ht 72.0 in | Wt 171.0 lb

## 2012-10-28 DIAGNOSIS — I4891 Unspecified atrial fibrillation: Secondary | ICD-10-CM

## 2012-10-28 DIAGNOSIS — I4819 Other persistent atrial fibrillation: Secondary | ICD-10-CM

## 2012-10-28 DIAGNOSIS — Z7901 Long term (current) use of anticoagulants: Secondary | ICD-10-CM

## 2012-10-28 DIAGNOSIS — Z954 Presence of other heart-valve replacement: Secondary | ICD-10-CM

## 2012-10-28 DIAGNOSIS — I359 Nonrheumatic aortic valve disorder, unspecified: Secondary | ICD-10-CM

## 2012-10-28 MED ORDER — BISOPROLOL FUMARATE 5 MG PO TABS: 2.5000 mg | ORAL_TABLET | Freq: Two times a day (BID) | ORAL | Status: DC

## 2012-10-28 NOTE — Patient Instructions (Addendum)
Your physician has recommended you make the following change in your medication:   1. Stop Metoprolol 2. Stop Multaq 3. Start Bisoprolol 2.5mg  twice daily (1/2 tablet daily).  Follow-up with Dr. Excell Seltzer as scheduled.John [name]

## 2012-10-28 NOTE — Assessment & Plan Note (Addendum)
Unfortunately, we have no good antiarrhythmic drug choices for John [name]. Initially I thought about AV node ablation and pacemaker insertion, but I am not convinced that his rates would justify this approach. For now, I would recommend continue rate control which is not perfect, but based on his 48 hour Holter monitor, fairly good. I'll plan to see the patient back on an as-needed basis. I plan to discuss the treatment recommendations with his primary cardiologist. I've asked the patient to stop taking his antiarrhythmic drug. Finally, I've asked the patient to stop taking metoprolol, and restart bisoprolol, as he tolerated this better in the past.

## 2012-10-28 NOTE — Progress Notes (Signed)
HPI John [name] returns today for followup. He is a very pleasant 79 year old man with a history of atrial fibrillation, who has been treated with multiple antiarrhythmic drugs. He has an extensive cardiac and pulmonary history. The patient is status post aortic valve replacement, and has oxygen-dependent lung disease do to COPD. He has failed amiodarone do to worsening lung problems, failed dofetilide secondary to prolongation of the QT interval, and most recently failed Multaq. He has been cardioverted on several occasions but has not maintained sinus rhythm, most recently a few weeks ago. He does feel better in sinus rhythm he states. Allergies  Allergen Reactions  . Penicillins     REACTION: rash/hives, facial swelling     Current Outpatient Prescriptions  Medication Sig Dispense Refill  . acetaminophen (TYLENOL) 500 MG tablet Take 1,000 mg by mouth every 6 (six) hours as needed for pain.       Marland Kitchen albuterol (VENTOLIN HFA) 108 (90 BASE) MCG/ACT inhaler Inhale 2 puffs into the lungs every 4 (four) hours as needed.  1 Inhaler  6  . allopurinol (ZYLOPRIM) 100 MG tablet 1 tab daily      . aspirin 81 MG tablet Take 81 mg by mouth daily.        . famotidine (PEPCID) 20 MG tablet Take 20 mg by mouth daily as needed for heartburn.       . furosemide (LASIX) 40 MG tablet Take 1 tablet by mouth twice daily  60 tablet  9  . guaiFENesin (MUCINEX) 600 MG 12 hr tablet Take 2 tablets (1,200 mg total) by mouth as needed.  120 suppository  11  . lisinopril (PRINIVIL,ZESTRIL) 2.5 MG tablet Take 2.5 mg by mouth daily.      . NON FORMULARY 2 L. Oxygen continuous       . potassium chloride SA (K-DUR,KLOR-CON) 20 MEQ tablet Take 20 mEq by mouth daily.      Marland Kitchen warfarin (COUMADIN) 2.5 MG tablet TAKE ON Monday,WEDNESDAY AND Friday AS DIRECTED BY COUMADIN CLINIC      . warfarin (COUMADIN) 5 MG tablet Take as directed by coumadin clinic  60 tablet  1  . bisoprolol (ZEBETA) 5 MG tablet Take 0.5 tablets (2.5 mg total) by  mouth 2 (two) times daily.  30 tablet  2   No current facility-administered medications for this visit.     Past Medical History  Diagnosis Date  . PAF (paroxysmal atrial fibrillation)     a. intolerant to amio (? lung toxicity) and tikosyn (prolonged QT);  b. 07/2012 s/p DCCV;  c. 09/2012 s/p DCCV - Multaq initiated.  . Aortic valve disorders     a. s/p AVR, 2006;  b. chronic coumadin.  . Other primary cardiomyopathies   . Edema     CHRONIC LOWER EXTREMITY  . Unspecified essential hypertension   . Other and unspecified hyperlipidemia   . Heart valve replaced by other means   . Other malaise and fatigue   . Coronary atherosclerosis of native coronary artery   . Acute myocardial infarction, subendocardial infarction, subsequent episode of care   . Unspecified adverse effect of unspecified drug, medicinal and biological substance   . Umbilical hernia without mention of obstruction or gangrene   . Sebaceous cyst   . Labyrinthitis, unspecified   . Personal history of other diseases of digestive system   . Special screening for malignant neoplasm of prostate   . BC (bronchogenic carcinoma)     S/P LOBECTOMY 2011  . Shortness of breath   .  COPD (chronic obstructive pulmonary disease)   . GERD (gastroesophageal reflux disease)     ROS:   All systems reviewed and negative except as noted in the HPI.   Past Surgical History  Procedure Laterality Date  . Aortic valve replacement  1994    [facility]  . Appendectomy  [facility]  . Abdominal hernia repair    . Lobectomy  2011  . Cardioversion N/A 08/03/2012    Procedure: CARDIOVERSION;  Surgeon: [name], MD;  Location: [facility] ENDOSCOPY;  Service: Cardiovascular;  Laterality: N/A;  . Eye surgery      retinal repair  . Cardioversion N/A 09/20/2012    Procedure: CARDIOVERSION;  Surgeon: Marinus Maw, MD;  Location: [facility] OR;  Service: Cardiovascular;  Laterality: N/A;     Family History  Problem Relation Age of Onset  . Colon cancer  Father   . Aneurysm Mother     RUPTURE-NECK   . Coronary artery disease Mother      History   Social History  . Marital Status: Married    Spouse Name: N/A    Number of Children: 1  . Years of Education: N/A   Occupational History  . [employer]     PHOTOGRAPHER   Social History Main Topics  . Smoking status: Former Smoker -- 1.00 packs/day for 40 years    Types: Cigarettes    Quit date: 02/10/2009  . Smokeless tobacco: Never Used  . Alcohol Use: Yes     Comment: socially "once in a while"  . Drug Use: No  . Sexual Activity: No   Other Topics Concern  . Not on file   Social History Narrative   HSG, 2 years college. married '70 . 1 daughter  '70. work: Environmental manager, retired.   ACP - no living will - provided information at today's visit.      BP 111/73  Pulse 59  Ht 6' (1.829 m)  Wt 171 lb (77.565 kg)  BMI 23.19 kg/m2  Physical Exam:  Chronically ill appearing 79 year old man, NAD HEENT: Unremarkable Neck:  7 cm JVD, no thyromegally Back:  No CVA tenderness Lungs:  Clear with no wheezes, rales, or rhonchi. Decreased breath sounds throughout. HEART:  Regular rate rhythm, no murmurs, no rubs, no clicks Abd:  soft, positive bowel sounds, no organomegally, no rebound, no guarding Ext:  2 plus pulses, no edema, no cyanosis, no clubbing Skin:  No rashes no nodules Neuro:  CN II through XII intact, motor grossly intact  EKG - atrial fibrillation with a controlled ventricular response  48-hour Holter - atrial fibrillation with a low rate of around 50 and a high rate of around 110 beats per minute with an average heart rate in the 70-80/min range.  Assess/Plan:

## 2012-11-09 ENCOUNTER — Other Ambulatory Visit: Payer: Self-pay

## 2012-11-09 ENCOUNTER — Telehealth: Payer: Self-pay | Admitting: *Deleted

## 2012-11-09 DIAGNOSIS — I4819 Other persistent atrial fibrillation: Secondary | ICD-10-CM

## 2012-11-09 NOTE — Telephone Encounter (Signed)
Pt's wife states pt has been in A-fib, today early AM his heart rate measure with the pulse oximetry was 155 beats/ minute. Pt took the scheduled Bisoprolol 5 mg this Am . Pt's heart rate has been 120 beats/minute since 8:30 AM.  Pt   takes Bisoprolol 5 mg twice a day. The second dose is due a 6 PM today  Pt's wife would like to know if he can take an extra dose of Bisoprolol medication.

## 2012-11-09 NOTE — Telephone Encounter (Signed)
New Problem:  Pt states he has atrial fib and he took bisoprol 5 mg. Pt states his heart rate has been 120. Should he take another pill or not. Pt would like to be advised. Pt states his heart rate fills rapid and its uncomfortable.

## 2012-11-09 NOTE — Telephone Encounter (Signed)
Dr. Ladona Ridgel aware of pt's symptoms. MD recommended for pt to take an extra 5 mg Bisoprolol today and as needed once a day. Pt to  continue to take Bisoprolol  5 mg twice a day.

## 2012-11-09 NOTE — Telephone Encounter (Deleted)
Error

## 2012-11-11 ENCOUNTER — Ambulatory Visit (INDEPENDENT_AMBULATORY_CARE_PROVIDER_SITE_OTHER): Payer: Medicare Other | Admitting: General Practice

## 2012-11-11 DIAGNOSIS — Z7901 Long term (current) use of anticoagulants: Secondary | ICD-10-CM

## 2012-11-11 DIAGNOSIS — I4891 Unspecified atrial fibrillation: Secondary | ICD-10-CM

## 2012-11-11 DIAGNOSIS — I359 Nonrheumatic aortic valve disorder, unspecified: Secondary | ICD-10-CM

## 2012-11-11 DIAGNOSIS — Z954 Presence of other heart-valve replacement: Secondary | ICD-10-CM

## 2012-11-11 LAB — POCT INR: INR: 2.1

## 2012-11-11 MED ORDER — METOPROLOL TARTRATE 25 MG PO TABS: 25.0000 mg | ORAL_TABLET | Freq: Two times a day (BID) | ORAL | Status: DC

## 2012-11-11 NOTE — Telephone Encounter (Signed)
HR 120 and oxygen 98% RA today in office  BP 110/68.

## 2012-11-11 NOTE — Telephone Encounter (Signed)
Pt saw Dr Ladona Ridgel 10/28/12  Stopped Metoprolol and Multaq and started Bisoprolol 5mg  1/2 tablet BID.  Pt has been having tachycardia HR 120's consistently for at least 1 week.  Pt called into office has been taking 5mg  1 tablet 3 times daily per our instructions (take 1 tablet BID and an extra tablet prn for elevated HR) since 11/09/12 and HR still remains in 120's most of the time.  Pt is running out of Bisoprolol early needs refill, but medication does not seem to controlling elevated HR.  Please advise.

## 2012-11-11 NOTE — Telephone Encounter (Signed)
Discussed with Dr Ladona Ridgel.  Per Dr Ladona Ridgel d/c Bisoprolol and start taking Metoprolol 25mg  BID.  Continue to monitor BP and HR at home and call with problems.  Rx sent to [facility].  Instructions relayed to pt in office today at [facility] appt.  Pt and his wife verbalize understanding.  Made f/u appt for nurse visit in 1 week for EKG/BP/HR check per Dr Ladona Ridgel on 11/19/12 at 11am.

## 2012-11-11 NOTE — Patient Instructions (Signed)
Stop taking Bisoprolol.  Start taking Metoprolol 25mg  twice daily.  Follow-up nurse visit for EKG, BP, and HR in 1 week.

## 2012-11-19 ENCOUNTER — Ambulatory Visit (INDEPENDENT_AMBULATORY_CARE_PROVIDER_SITE_OTHER): Payer: Medicare Other | Admitting: *Deleted

## 2012-11-19 ENCOUNTER — Encounter: Payer: Self-pay | Admitting: *Deleted

## 2012-11-19 VITALS — BP 118/74 | HR 96 | Ht 72.0 in | Wt 168.4 lb

## 2012-11-19 DIAGNOSIS — I4891 Unspecified atrial fibrillation: Secondary | ICD-10-CM

## 2012-11-19 DIAGNOSIS — I4819 Other persistent atrial fibrillation: Secondary | ICD-10-CM

## 2012-11-19 MED ORDER — METOPROLOL TARTRATE 50 MG PO TABS: 50.0000 mg | ORAL_TABLET | Freq: Two times a day (BID) | ORAL | Status: DC

## 2012-11-19 NOTE — Patient Instructions (Signed)
Discussed with Dr Ladona Ridgel and have instructed the patient to increase his Metoprolol to 50mg  twice daily.  Advised to d/c caffeine and keep a log of his HR and BP and bring them with him next Thursday to the CVRR appointment.

## 2012-11-19 NOTE — Progress Notes (Signed)
Patient here today for EKG and BP check per Dr Ladona Ridgel.  On 11/11/12 his Bisoprolol was stopped and Metoprolol 25mg  twice daily was started secondary to his HR still being elevated.  He brought in a log of his HR's which range from 65-126.  He admits to having 3 glasses of caffeine a day and is also using his Albuterol inhaler at least twice daily.  Since Sat they have been taking Metoprolol 25mg  tid and his HR's are still the same.  I have discussed with Dr Ladona Ridgel and will advise the patient to increase his Metoprolol to 50mg  twice daily.   They will continue to keep a log and bring it to his CVRR appointment next Thursday.  He will call if any problems prior.  They have verbalized understanding and agee with plan.

## 2012-11-25 ENCOUNTER — Ambulatory Visit (INDEPENDENT_AMBULATORY_CARE_PROVIDER_SITE_OTHER): Payer: Medicare Other | Admitting: General Practice

## 2012-11-25 DIAGNOSIS — I359 Nonrheumatic aortic valve disorder, unspecified: Secondary | ICD-10-CM

## 2012-11-25 DIAGNOSIS — Z954 Presence of other heart-valve replacement: Secondary | ICD-10-CM

## 2012-11-25 DIAGNOSIS — Z7901 Long term (current) use of anticoagulants: Secondary | ICD-10-CM

## 2012-11-25 DIAGNOSIS — I4891 Unspecified atrial fibrillation: Secondary | ICD-10-CM

## 2012-11-25 LAB — POCT INR: INR: 2.2

## 2012-11-29 ENCOUNTER — Ambulatory Visit (INDEPENDENT_AMBULATORY_CARE_PROVIDER_SITE_OTHER): Payer: Medicare Other | Admitting: Emergency Medicine

## 2012-11-29 ENCOUNTER — Encounter: Payer: Self-pay | Admitting: Emergency Medicine

## 2012-11-29 VITALS — BP 106/58 | HR 111 | Temp 97.0°F | Ht 72.0 in | Wt 173.6 lb

## 2012-11-29 DIAGNOSIS — J189 Pneumonia, unspecified organism: Secondary | ICD-10-CM

## 2012-11-29 DIAGNOSIS — C3490 Malignant neoplasm of unspecified part of unspecified bronchus or lung: Secondary | ICD-10-CM

## 2012-11-29 DIAGNOSIS — IMO0002 Reserved for concepts with insufficient information to code with codable children: Secondary | ICD-10-CM

## 2012-11-29 DIAGNOSIS — Z23 Encounter for immunization: Secondary | ICD-10-CM

## 2012-11-29 DIAGNOSIS — J449 Chronic obstructive pulmonary disease, unspecified: Secondary | ICD-10-CM

## 2012-11-29 DIAGNOSIS — I2789 Other specified pulmonary heart diseases: Secondary | ICD-10-CM

## 2012-11-29 NOTE — Assessment & Plan Note (Signed)
Would like to have him on scheduled long-acting BD's, but he feels worse on them because they perturb his A fib. Will not restart these, continue albuterol prn which he rarely uses. O2 at all times. Flu shot today. Follow up in 6 months

## 2012-11-29 NOTE — Assessment & Plan Note (Signed)
?   Related to amiodarone. No evidence recurrence.

## 2012-11-29 NOTE — Patient Instructions (Signed)
Please continue your oxygen. Keep your albuterol available to use as needed Flu shot today Follow with Dr Delton Coombes in 6 months or sooner if you have any problems

## 2012-11-29 NOTE — Assessment & Plan Note (Signed)
Managing hypoxemia, underlying contributors.

## 2012-11-29 NOTE — Progress Notes (Signed)
Subjective:    Patient ID: John [name], male    DOB: 1933-08-28, 79 y.o.   MRN: [id number] HPI 79 yo complicated pt w hx COPD, NCSLCA s/p resection. Parox A Fib that has been assoc with vol overload and dyspnea. Also w pulm infilgtrates, ? 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 Has been in and out of hosp in Feb 2012 due to exacerbations, RVR, etc. Last d/c was 2/20. Feels that his HR is better controlled on Tikosyn.   Last visit we made plan to wean Pred to off (COP vs Amio tox). He feels that his breathing is stable, has been off the pred x 3 days. He is complaining of severe gluteal pain bilaterally radiating to each thigh, started about 3 weeks ago. Of note he started Pravachol about 3 weeks ago.   ROV 06/10/10 -- COPD, NSCLCA w resection, A Fib, ? 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 He follows up his dyspnea, gluteal myalgias. He has been off the prednisone for over a month, seems to be holding his own without the Pred. The myalgias are better off the pravachol, decided w Dr Burt Knack not to try an alternative.   ROV 07/10/10 -- follows up for COPD, restriction from lung resection. 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 He has been doing better since last visit a month ago. He stopped Symbicort as we had planned, doesn't seem to miss it. He is taking the Spiriva. Rare Albuterol use. May be having some increased cough and allergies.   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 Remains on Spiriva, has been off Symbicort for several months. Had CT scan 8/8 to follow NSCLCA - R effusion is smaller, small basilar nodule that will need follow up. He has been having problems with sneezing and coughing for the last several months. Clear nasal drainage. Has been taken off tykosyn since last visit. Currently in NSR.   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 Last CT chest was 09/18/10 for R basilar nodule. Needs to requalify for o2. Last time we initiated trial off Spiriva. He doesn't believe  he missed it. Never uses or needs the albuterol. He remains active, cleaned the gutters this week w O2 on!.   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 CT scan repeated in 03/2011, stable compared with priors. Now off of BD's and seems to be tolerating well. He continues to do well, has been able to stay active. He often has been able to go without O2 at rest. 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 CT scan repeated in 03/2011, stable compared with priors. Now off of BD's and seems to be tolerating well. He has stable DOE - never thought BD's helped him. Having nasal drainage on  guaifenesin. Stopped fluticasone spray. He is using albuterol prn - rarely uses. Still on lisinopril,  very rare cough.   Acute OV 02/26/12 --  Complains of  dry cough x 1 day,alot at night,wife on Tamiflu. sob-same right now, weakness, no wheezing, no cp or tightness,temp 102 now with xtra strength  Tylenol.  Wife began with URI symptoms , seen by PCP this week , dx w/ flu (no swab)  , rx Tamiflu Yesterday he began with dry cough and fever -tmax 102. Body aches today .  No wheezing or increased dypsnea.  No  hemoptyiss. Appetite is fair. No n/v.  No edema.   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 CT scan repeated in 03/2011, stable compared with priors. Had been on BD's (he never believed he benefited) and tolerating as of last visit 10/13, but since then has had problems >> treated for an AE in January as above by TP. Also followed with Dr Excell Seltzer >> TTE from 12/13 with estimated peak PAP 81 mmHg (was 63 in 10/2009, normal in 11/09). His CT scan 11/13 without significant ILD, but severe emphysematous changes. CXR today with chronic R vol loss and effusion (post-lobectomy). He feels better now from the acute exacerbation. He knows  that he desaturates w exertion >> to 85% with changing clothes or showering.   ROV 04/29/12 -- COPD, NSCLCA s/p resection, hx COP, multifactorial secondary PAH, hypoxemia. Last time, given his significant emphysema on Ct scan and his FEV1, decide to retry BD's >> started [place] + Breo. He still has SOB with changing clothes, etc. Wearing 2L/min, up to 4L/min w most exertion. He has been having itching all over (no rash)  Since starting the BD's. Hasn't really tested his exertional tolerance.   ROV 06/02/12 -- COPD, NSCLCA s/p resection, hx COP, multifactorial secondary PAH, hypoxemia. Given his significant emphysema on Ct scan and his FEV1, decided to retry BD's in 3/'14 >> started [place] + Breo. We stopped the [place] last visit after he developed itching. He stayed on [place], is still unclear whether the [place] helps. He is limited at baseline, cannot exercise. He is having frequent aspiration sx - "gets choked".   ROV 11/29/12 -- COPD, NSCLCA s/p resection, hx COP, multifactorial secondary PAH, hypoxemia. He also has A Fib, has had cardioversions but these didn't last, currently on metoprolol.  His COPD regimen is albuterol prn which he rarely uses. His Breo was stopped as a potential contributor to his tachycardia - he wasn't sure it it was helping him.    CAT Score 04/29/2012 03/18/2012  Total CAT Score 8 11       Objective:   Physical Exam Filed Vitals:   11/29/12 1112 11/29/12 1113  BP:  106/58  Pulse:  111  Temp: 97 F (36.1 C)   TempSrc: Oral   Height: 6' (1.829 m)   Weight: 173 lb 9.6 oz (78.744 kg)   SpO2:  92%   Gen: Pleasant, chronically ill, normal affect, comfortable on O2  ENT: No lesions,  mouth clear,  oropharynx clear, no postnasal drip  Neck: No JVD, no TMG, no carotid bruits  Lungs: No use of accessory muscles, no dullness to percussion, decreased BS in bases , no wheezing or crackles .   Cardiovascular: RRR, heart sounds normal, no murmur or gallops, trace peripheral  edema  Musculoskeletal: No deformities, no cyanosis or clubbing  Neuro: alert, non focal  Skin: Warm, no lesions or rashes    CXR 09/19/12 --  Clinical Data: 79 year old male with tachycardia, atrial  fibrillation and shortness of breath.  PORTABLE CHEST - 1 VIEW  Comparison: 03/18/2012 and prior chest radiographs  Findings: Cardiomegaly and cardiac surgical changes noted.  Right basilar atelectasis/scarring again noted.  There may be a very small right pleural effusion present.  Minimal interstitial opacities are noted and may represent minimal  interstitial pulmonary edema.  There is no evidence of pneumothorax.  IMPRESSION:  Right basilar atelectasis/scarring again noted with possible small  right  pleural effusion.  Minimal interstitial opacities - minimal interstitial edema not  excluded    Assessment & Plan:   COPD Would like to have him on scheduled long-acting BD's, but he feels worse on them because they perturb his A fib. Will not restart these, continue albuterol prn which he rarely uses. O2 at all times. Flu shot today. Follow up in 6 months  CARCINOMA, LUNG, NONSMALL CELL Stable post-op. Last CT scan was 12/2011.   Secondary pulmonary hypertension Managing hypoxemia, underlying contributors.   PNEUMONITIS ? Related to amiodarone. No evidence recurrence.

## 2012-11-29 NOTE — Assessment & Plan Note (Addendum)
Stable post-op. Last CT scan was 12/2011.

## 2012-12-09 ENCOUNTER — Ambulatory Visit (INDEPENDENT_AMBULATORY_CARE_PROVIDER_SITE_OTHER): Payer: Medicare Other | Admitting: *Deleted

## 2012-12-09 DIAGNOSIS — Z954 Presence of other heart-valve replacement: Secondary | ICD-10-CM

## 2012-12-09 DIAGNOSIS — Z7901 Long term (current) use of anticoagulants: Secondary | ICD-10-CM

## 2012-12-09 DIAGNOSIS — I359 Nonrheumatic aortic valve disorder, unspecified: Secondary | ICD-10-CM

## 2012-12-09 DIAGNOSIS — I4891 Unspecified atrial fibrillation: Secondary | ICD-10-CM

## 2012-12-14 ENCOUNTER — Other Ambulatory Visit: Payer: Self-pay

## 2012-12-14 MED ORDER — METOPROLOL TARTRATE 50 MG PO TABS: 50.0000 mg | ORAL_TABLET | Freq: Two times a day (BID) | ORAL | Status: DC

## 2012-12-15 ENCOUNTER — Other Ambulatory Visit: Payer: Self-pay

## 2012-12-15 MED ORDER — METOPROLOL TARTRATE 50 MG PO TABS: 50.0000 mg | ORAL_TABLET | Freq: Two times a day (BID) | ORAL | Status: DC

## 2012-12-17 ENCOUNTER — Other Ambulatory Visit: Payer: Self-pay

## 2012-12-17 MED ORDER — METOPROLOL TARTRATE 50 MG PO TABS: 50.0000 mg | ORAL_TABLET | Freq: Two times a day (BID) | ORAL | Status: DC

## 2012-12-23 ENCOUNTER — Ambulatory Visit (INDEPENDENT_AMBULATORY_CARE_PROVIDER_SITE_OTHER): Payer: Medicare Other | Admitting: *Deleted

## 2012-12-23 ENCOUNTER — Encounter: Payer: Self-pay | Admitting: Internal Medicine

## 2012-12-23 ENCOUNTER — Ambulatory Visit (INDEPENDENT_AMBULATORY_CARE_PROVIDER_SITE_OTHER): Payer: Medicare Other | Admitting: Internal Medicine

## 2012-12-23 VITALS — BP 112/74 | HR 73 | Ht 71.5 in | Wt 174.0 lb

## 2012-12-23 DIAGNOSIS — Z954 Presence of other heart-valve replacement: Secondary | ICD-10-CM

## 2012-12-23 DIAGNOSIS — I4891 Unspecified atrial fibrillation: Secondary | ICD-10-CM

## 2012-12-23 DIAGNOSIS — I10 Essential (primary) hypertension: Secondary | ICD-10-CM

## 2012-12-23 DIAGNOSIS — I359 Nonrheumatic aortic valve disorder, unspecified: Secondary | ICD-10-CM

## 2012-12-23 DIAGNOSIS — I4819 Other persistent atrial fibrillation: Secondary | ICD-10-CM

## 2012-12-23 DIAGNOSIS — Z7901 Long term (current) use of anticoagulants: Secondary | ICD-10-CM

## 2012-12-23 MED ORDER — METOPROLOL TARTRATE 50 MG PO TABS: ORAL_TABLET | ORAL | Status: DC

## 2012-12-23 NOTE — Assessment & Plan Note (Signed)
His ventricular rate is still elevated. We will uptitrate his beta blocker today. We considered adding digoxin as well but we'll plan on 1 therapeutic change at a time.

## 2012-12-23 NOTE — Patient Instructions (Signed)
Your physician recommends that you schedule a follow-up appointment in: 3 months with Dr Ladona Ridgel  Your physician has recommended you make the following change in your medication:  1) Increase Metoprolol to 75mg  twice daily

## 2012-12-23 NOTE — Assessment & Plan Note (Signed)
His blood pressure is fairly well controlled. We will have him increase his metoprolol to help with his rate control.

## 2012-12-23 NOTE — Progress Notes (Signed)
HPI [name] returns today for followup. He is a very pleasant 79 year old man with persistent atrial fibrillation, chronic systolic heart failure, severe, oxygen-dependent COPD, and hypertension. He is on chronic anticoagulation. When I saw him last, we decided to pursue a strategy of rate control. In the interim, he has checked his heart rate and blood pressure once or twice a day. This demonstrates that his heart rates have improved but are still increased. His blood pressure is fairly normal. He notes that when his heart rate is less than 100 beats per minute, he tends to feel better. When it is over 110 beats per minute, he tends to be more breathless, and have less energy.  Allergies  Allergen Reactions  . Penicillins     REACTION: rash/hives, facial swelling     Current Outpatient Prescriptions  Medication Sig Dispense Refill  . acetaminophen (TYLENOL) 500 MG tablet Take 1,000 mg by mouth every 6 (six) hours as needed for pain.       Marland Kitchen albuterol (VENTOLIN HFA) 108 (90 BASE) MCG/ACT inhaler Inhale 2 puffs into the lungs every 4 (four) hours as needed.  1 Inhaler  6  . allopurinol (ZYLOPRIM) 100 MG tablet 1 tab daily      . aspirin 81 MG tablet Take 81 mg by mouth daily.        . famotidine (PEPCID) 20 MG tablet Take 20 mg by mouth daily as needed for heartburn.       . furosemide (LASIX) 40 MG tablet Take 1 tablet by mouth twice daily  60 tablet  9  . guaiFENesin (MUCINEX) 600 MG 12 hr tablet Take 2 tablets (1,200 mg total) by mouth as needed.  120 suppository  11  . lisinopril (PRINIVIL,ZESTRIL) 2.5 MG tablet Take 2.5 mg by mouth daily.      . metoprolol (LOPRESSOR) 50 MG tablet Take 1 tablet (50 mg total) by mouth 2 (two) times daily.  60 tablet  6  . NON FORMULARY 2 L. Oxygen continuous       . potassium chloride SA (K-DUR,KLOR-CON) 20 MEQ tablet Take 20 mEq by mouth daily.      Marland Kitchen warfarin (COUMADIN) 2.5 MG tablet TAKE ON Monday,WEDNESDAY AND Friday AS DIRECTED BY COUMADIN  CLINIC      . warfarin (COUMADIN) 5 MG tablet Take as directed by coumadin clinic  60 tablet  1   No current facility-administered medications for this visit.     Past Medical History  Diagnosis Date  . PAF (paroxysmal atrial fibrillation)     a. intolerant to amio (? lung toxicity) and tikosyn (prolonged QT);  b. 07/2012 s/p DCCV;  c. 09/2012 s/p DCCV - Multaq initiated.  . Aortic valve disorders     a. s/p AVR, 2006;  b. chronic coumadin.  . Other primary cardiomyopathies   . Edema     CHRONIC LOWER EXTREMITY  . Unspecified essential hypertension   . Other and unspecified hyperlipidemia   . Heart valve replaced by other means   . Other malaise and fatigue   . Coronary atherosclerosis of native coronary artery   . Acute myocardial infarction, subendocardial infarction, subsequent episode of care   . Unspecified adverse effect of unspecified drug, medicinal and biological substance   . Umbilical hernia without mention of obstruction or gangrene   . Sebaceous cyst   . Labyrinthitis, unspecified   . Personal history of other diseases of digestive system   . Special screening for malignant neoplasm  of prostate   . BC (bronchogenic carcinoma)     S/P LOBECTOMY 2011  . Shortness of breath   . COPD (chronic obstructive pulmonary disease)   . GERD (gastroesophageal reflux disease)     ROS:   All systems reviewed and negative except as noted in the HPI.   Past Surgical History  Procedure Laterality Date  . Aortic valve replacement  1994    [facility]  . Appendectomy  [facility]  . Abdominal hernia repair    . Lobectomy  2011  . Cardioversion N/A 08/03/2012    Procedure: CARDIOVERSION;  Surgeon: [name], MD;  Location: [facility] ENDOSCOPY;  Service: Cardiovascular;  Laterality: N/A;  . Eye surgery      retinal repair  . Cardioversion N/A 09/20/2012    Procedure: CARDIOVERSION;  Surgeon: Marinus Maw, MD;  Location: [facility] OR;  Service: Cardiovascular;  Laterality: N/A;     Family  History  Problem Relation Age of Onset  . Colon cancer Father   . Aneurysm Mother     RUPTURE-NECK   . Coronary artery disease Mother      History   Social History  . Marital Status: Married    Spouse Name: N/A    Number of Children: 1  . Years of Education: N/A   Occupational History  . [employer]     PHOTOGRAPHER   Social History Main Topics  . Smoking status: Former Smoker -- 1.00 packs/day for 40 years    Types: Cigarettes    Quit date: 02/10/2009  . Smokeless tobacco: Never Used  . Alcohol Use: Yes     Comment: socially "once in a while"  . Drug Use: No  . Sexual Activity: No   Other Topics Concern  . Not on file   Social History Narrative   HSG, 2 years college. married '70 . 1 daughter  '70. work: Environmental manager, retired.   ACP - no living will - provided information at today's visit.      BP 112/74  Pulse 73  Ht 5' 11.5" (1.816 m)  Wt 174 lb (78.926 kg)  BMI 23.93 kg/m2  Physical Exam:  Chronically ill appearing 79 year old man, NAD HEENT: Unremarkable Neck:  No JVD, no thyromegally Lymphatics:  No adenopathy Back:  No CVA tenderness Lungs:  Clear with decreased breath sounds throughout but no wheezes, rales, or rhonchi. HEART:  Regular rate rhythm, no murmurs, no rubs, no clicks Abd:  soft, positive bowel sounds, no organomegally, no rebound, no guarding Ext:  2 plus pulses, no edema, no cyanosis, no clubbing Skin:  No rashes no nodules Neuro:  CN II through XII intact, motor grossly intact   Assess/Plan:

## 2012-12-23 NOTE — Assessment & Plan Note (Signed)
His ventricular rates still needs some work. He'll continue his anticoagulation. We'll uptitrate his beta blocker.

## 2013-01-05 ENCOUNTER — Other Ambulatory Visit: Payer: Self-pay

## 2013-01-05 ENCOUNTER — Other Ambulatory Visit: Payer: Self-pay | Admitting: Emergency Medicine

## 2013-01-12 ENCOUNTER — Ambulatory Visit (INDEPENDENT_AMBULATORY_CARE_PROVIDER_SITE_OTHER): Payer: Medicare Other | Admitting: *Deleted

## 2013-01-12 ENCOUNTER — Encounter: Payer: Self-pay | Admitting: Cardiovascular Disease

## 2013-01-12 ENCOUNTER — Ambulatory Visit (INDEPENDENT_AMBULATORY_CARE_PROVIDER_SITE_OTHER): Payer: Medicare Other | Admitting: Cardiovascular Disease

## 2013-01-12 VITALS — BP 132/80 | HR 79 | Ht 71.5 in | Wt 174.4 lb

## 2013-01-12 DIAGNOSIS — I4891 Unspecified atrial fibrillation: Secondary | ICD-10-CM

## 2013-01-12 DIAGNOSIS — I359 Nonrheumatic aortic valve disorder, unspecified: Secondary | ICD-10-CM

## 2013-01-12 DIAGNOSIS — I2510 Atherosclerotic heart disease of native coronary artery without angina pectoris: Secondary | ICD-10-CM

## 2013-01-12 DIAGNOSIS — Z954 Presence of other heart-valve replacement: Secondary | ICD-10-CM

## 2013-01-12 DIAGNOSIS — I10 Essential (primary) hypertension: Secondary | ICD-10-CM

## 2013-01-12 DIAGNOSIS — Z7901 Long term (current) use of anticoagulants: Secondary | ICD-10-CM

## 2013-01-12 MED ORDER — METOPROLOL TARTRATE 100 MG PO TABS: 100.0000 mg | ORAL_TABLET | Freq: Two times a day (BID) | ORAL | Status: DC

## 2013-01-12 NOTE — Patient Instructions (Addendum)
Your physician has recommended you make the following change in your medication: INCREASE Metoprolol Tartrate to 100mg  take one by mouth twice a day, STOP Lisinopril  You have been referred to Dr Val Riles at [facility].

## 2013-01-12 NOTE — Progress Notes (Signed)
HPI:  79 year old gentleman presenting for followup evaluation. He has a fairly complex cardiac history. The patient has aortic stenosis with remote mechanical [employer] aortic valve replacement. He developed a lung cancer and underwent lobectomy in 2011. He's been limited by chronic dyspnea ever since that time. He does have chronic lung disease with COPD but was not significantly limited before his surgery., Postoperatively he developed atrial fibrillation. He was treated with amiodarone which resulted in further pulmonary toxicity. He was then treated with Tikosyn but this had to be discontinued because of significant QT prolongation even on very low doses. The patient has undergone multiple cardioversions. He has been maintained on chronic anticoagulation with warfarin in the context of his mechanical aortic valve replacement and paroxysmal atrial fibrillation.  He continues to feel poorly. He was seen by Dr. Ladona Ridgel last month and his beta blocker was increased for better heart rate control. He inquires today about a referral for radiofrequency ablation. He is interested in going to [facility] and has done a lot of reading on their website.  He denies chest pain or pressure. He denies edema, orthopnea, or PND. His oxygen saturations at home have been good at rest, even on room air. However, he relates worsening shortness of breath and generalized fatigue to his atrial fibrillation and feels that the symptoms are much worse when his heart rate is fast. He reports that he has felt very well within the first 1 or 2 days after cardioversion when he is in sinus rhythm.  Outpatient Encounter Prescriptions as of 01/12/2013  Medication Sig  . acetaminophen (TYLENOL) 500 MG tablet Take 1,000 mg by mouth every 6 (six) hours as needed for pain.   Marland Kitchen albuterol (VENTOLIN HFA) 108 (90 BASE) MCG/ACT inhaler Inhale 2 puffs into the lungs every 4 (four) hours as needed.  Marland Kitchen aspirin 81 MG tablet Take 81 mg by  mouth daily.    . famotidine (PEPCID) 20 MG tablet Take 20 mg by mouth daily as needed for heartburn.   . furosemide (LASIX) 40 MG tablet Take 1 tablet by mouth twice daily  . guaiFENesin (MUCINEX) 600 MG 12 hr tablet Take 2 tablets (1,200 mg total) by mouth as needed.  Marland Kitchen ipratropium (ATROVENT) 0.03 % nasal spray USE 2 SPRAYS IN EACH NOSTRIL EVERY 12 HOURS.  Marland Kitchen lisinopril (PRINIVIL,ZESTRIL) 2.5 MG tablet Take 2.5 mg by mouth daily.  . metoprolol (LOPRESSOR) 50 MG tablet Take 1 1/2 tablets twice daily  . NON FORMULARY 2 L. Oxygen continuous   . potassium chloride SA (K-DUR,KLOR-CON) 20 MEQ tablet Take 20 mEq by mouth daily.  Marland Kitchen warfarin (COUMADIN) 2.5 MG tablet TAKE ON Monday,WEDNESDAY AND Friday AS DIRECTED BY COUMADIN CLINIC  . warfarin (COUMADIN) 5 MG tablet Take as directed by coumadin clinic  . [DISCONTINUED] allopurinol (ZYLOPRIM) 100 MG tablet 1 tab daily    Allergies  Allergen Reactions  . Penicillins     REACTION: rash/hives, facial swelling    Past Medical History  Diagnosis Date  . PAF (paroxysmal atrial fibrillation)     a. intolerant to amio (? lung toxicity) and tikosyn (prolonged QT);  b. 07/2012 s/p DCCV;  c. 09/2012 s/p DCCV - Multaq initiated.  . Aortic valve disorders     a. s/p AVR, 2006;  b. chronic coumadin.  . Other primary cardiomyopathies   . Edema     CHRONIC LOWER EXTREMITY  . Unspecified essential hypertension   . Other and unspecified hyperlipidemia   . Heart valve  replaced by other means   . Other malaise and fatigue   . Coronary atherosclerosis of native coronary artery   . Acute myocardial infarction, subendocardial infarction, subsequent episode of care   . Unspecified adverse effect of unspecified drug, medicinal and biological substance   . Umbilical hernia without mention of obstruction or gangrene   . Sebaceous cyst   . Labyrinthitis, unspecified   . Personal history of other diseases of digestive system   . Special screening for malignant  neoplasm of prostate   . BC (bronchogenic carcinoma)     S/P LOBECTOMY 2011  . Shortness of breath   . COPD (chronic obstructive pulmonary disease)   . GERD (gastroesophageal reflux disease)    ROS: Negative except as per HPI  BP 132/80  Pulse 79  Ht 5' 11.5" (1.816 m)  Wt 174 lb 6.4 oz (79.107 kg)  BMI 23.99 kg/m2  PHYSICAL EXAM: Pt is alert and oriented, NAD, on oxygen per nasal cannula HEENT: normal Neck: JVP - normal, carotids 2+= without bruits Lungs: Diminished bilaterally CV: Irregularly irregular with normal mechanical aortic valve closure sounds Abd: soft, NT, Positive BS, no hepatomegaly Ext: no C/C/E, distal pulses intact and equal Skin: warm/dry no rash  EKG:  Atrial flutter with variable block, heart rate 79 beats per minute, nonspecific ST and T wave abnormality.  ASSESSMENT AND PLAN: 1. Atrial fibrillation, persistent. He was intolerant to amiodarone (pulmonary toxicity) and Tikosyn (QT prolongation at low dose). Recently treated with multaq and DCCV but back in AF. I am going to increase his metoprolol to 100 mg twice daily. At his request I will refer him to electrophysiology at [facility]. We discussed the pros and cons of radiofrequency ablation for atrial fibrillation, especially considering his multiple comorbid conditions and chronic lung disease. He's convinced that he feels better in sinus rhythm and I will refer him for further evaluation.  2. Hypertension. Will discontinue low-dose lisinopril in order to allow for blood pressure rose to up titrate his beta blocker. He brings in home blood pressure readings and they have been as low as 98/50.  3. Pulmonary hypertension. Related to severe lung disease and chronic hypoxemia. Continue oxygen therapy.  4. Aortic valve disease status post mechanical aortic valve replacement. He has had stable prosthetic valvular function by serial echocardiography. Continue warfarin.  John [name] 01/12/2013 5:38 PM

## 2013-01-26 ENCOUNTER — Ambulatory Visit (INDEPENDENT_AMBULATORY_CARE_PROVIDER_SITE_OTHER): Payer: Medicare Other | Admitting: *Deleted

## 2013-01-26 DIAGNOSIS — Z954 Presence of other heart-valve replacement: Secondary | ICD-10-CM

## 2013-01-26 DIAGNOSIS — Z5181 Encounter for therapeutic drug level monitoring: Secondary | ICD-10-CM

## 2013-01-26 DIAGNOSIS — I359 Nonrheumatic aortic valve disorder, unspecified: Secondary | ICD-10-CM

## 2013-01-26 DIAGNOSIS — Z7901 Long term (current) use of anticoagulants: Secondary | ICD-10-CM

## 2013-01-26 DIAGNOSIS — I4891 Unspecified atrial fibrillation: Secondary | ICD-10-CM

## 2013-01-26 LAB — POCT INR: INR: 2.7

## 2013-01-27 ENCOUNTER — Encounter: Payer: Self-pay | Admitting: Cardiovascular Disease

## 2013-01-27 NOTE — Telephone Encounter (Signed)
This encounter was created in error - please disregard.

## 2013-01-27 NOTE — Telephone Encounter (Signed)
New message  Patient is returning your call, He don't want to see Dr Johney Frame he wants to be referred to [facility]. Please call and advise

## 2013-02-01 ENCOUNTER — Other Ambulatory Visit: Payer: Self-pay | Admitting: Cardiovascular Disease

## 2013-02-11 ENCOUNTER — Telehealth: Payer: Self-pay | Admitting: Emergency Medicine

## 2013-02-11 MED ORDER — PREDNISONE 10 MG PO TABS: ORAL_TABLET | ORAL | Status: DC

## 2013-02-11 NOTE — Telephone Encounter (Signed)
Called and spoke with pts wife.  She stated that the pt has been having sob x 5 days that is worse with activity.  Cough with clear/yellow sputum.  Pt has been taking 2400 mg of mucinex daily.  Today started with fever---took tylenol and this is better now.  Pt does c/o sore in his chest area--maybe from coughing.  pts wife would like recs for the pt.  She is aware that no openings to be seen today.  RB  please advise. Thanks  Allergies  Allergen Reactions  . Penicillins     REACTION: rash/hives, facial swelling     Current Outpatient Prescriptions on File Prior to Visit  Medication Sig Dispense Refill  . acetaminophen (TYLENOL) 500 MG tablet Take 1,000 mg by mouth every 6 (six) hours as needed for pain.       Marland Kitchen albuterol (VENTOLIN HFA) 108 (90 BASE) MCG/ACT inhaler Inhale 2 puffs into the lungs every 4 (four) hours as needed.  1 Inhaler  6  . aspirin 81 MG tablet Take 81 mg by mouth daily.        . famotidine (PEPCID) 20 MG tablet Take 20 mg by mouth daily as needed for heartburn.       . furosemide (LASIX) 40 MG tablet Take 1 tablet by mouth twice daily  60 tablet  9  . guaiFENesin (MUCINEX) 600 MG 12 hr tablet Take 2 tablets (1,200 mg total) by mouth as needed.  120 suppository  11  . ipratropium (ATROVENT) 0.03 % nasal spray USE 2 SPRAYS IN EACH NOSTRIL EVERY 12 HOURS.  30 mL  5  . metoprolol (LOPRESSOR) 100 MG tablet Take 1 tablet (100 mg total) by mouth 2 (two) times daily.  180 tablet  3  . NON FORMULARY 2 L. Oxygen continuous       . potassium chloride SA (K-DUR,KLOR-CON) 20 MEQ tablet Take 20 mEq by mouth daily.      Marland Kitchen warfarin (COUMADIN) 2.5 MG tablet TAKE 1 TABLET AS DIRECTED BY COUMADIN CLINIC.  30 tablet  3  . warfarin (COUMADIN) 5 MG tablet Take as directed by coumadin clinic  60 tablet  1   No current facility-administered medications on file prior to visit.

## 2013-02-11 NOTE — Telephone Encounter (Signed)
Spoke with the pt's spouse and notified of recs per RB  He is wheezing, so pred taper called in  Nothing further needed

## 2013-02-11 NOTE — Telephone Encounter (Signed)
?   Whether he has flu or viral URI. He likely will need to be seen soon. He should use OTC symptom relief like tylenol cold & flu. Also please ask him if he is wheezing. If yes, then order prednisone > Take 40mg  daily for 3 days, then 30mg  daily for 3 days, then 20mg  daily for 3 days, then 10mg  daily for 3 days, then stop

## 2013-02-16 ENCOUNTER — Ambulatory Visit (INDEPENDENT_AMBULATORY_CARE_PROVIDER_SITE_OTHER)
Admission: RE | Admit: 2013-02-16 | Discharge: 2013-02-16 | Disposition: A | Discharge status: Home / Self Care | Payer: Medicare Other | Source: Ambulatory Visit | Attending: Emergency Medicine | Admitting: Emergency Medicine

## 2013-02-16 ENCOUNTER — Ambulatory Visit (INDEPENDENT_AMBULATORY_CARE_PROVIDER_SITE_OTHER): Payer: Medicare Other | Admitting: Emergency Medicine

## 2013-02-16 ENCOUNTER — Encounter: Payer: Self-pay | Admitting: Emergency Medicine

## 2013-02-16 ENCOUNTER — Ambulatory Visit (INDEPENDENT_AMBULATORY_CARE_PROVIDER_SITE_OTHER): Payer: Medicare Other | Admitting: Pharmacist

## 2013-02-16 ENCOUNTER — Other Ambulatory Visit: Payer: Self-pay | Admitting: Cardiovascular Disease

## 2013-02-16 VITALS — BP 130/80 | HR 120 | Ht 71.5 in | Wt 175.0 lb

## 2013-02-16 DIAGNOSIS — C3490 Malignant neoplasm of unspecified part of unspecified bronchus or lung: Secondary | ICD-10-CM

## 2013-02-16 DIAGNOSIS — J449 Chronic obstructive pulmonary disease, unspecified: Secondary | ICD-10-CM

## 2013-02-16 DIAGNOSIS — I359 Nonrheumatic aortic valve disorder, unspecified: Secondary | ICD-10-CM

## 2013-02-16 DIAGNOSIS — I4891 Unspecified atrial fibrillation: Secondary | ICD-10-CM

## 2013-02-16 DIAGNOSIS — Z7901 Long term (current) use of anticoagulants: Secondary | ICD-10-CM

## 2013-02-16 DIAGNOSIS — Z5181 Encounter for therapeutic drug level monitoring: Secondary | ICD-10-CM

## 2013-02-16 DIAGNOSIS — Z954 Presence of other heart-valve replacement: Secondary | ICD-10-CM

## 2013-02-16 LAB — POCT INR: INR: 2.1

## 2013-02-16 MED ORDER — POTASSIUM CHLORIDE CRYS ER 20 MEQ PO TBCR: 20.0000 meq | EXTENDED_RELEASE_TABLET | Freq: Every day | ORAL | Status: DC

## 2013-02-16 NOTE — Progress Notes (Signed)
Subjective:    Patient ID: John [name], male    DOB: 1933-08-28, 80 y.o.   MRN: [id number] HPI 80 yo complicated pt w hx COPD, NCSLCA s/p resection. Parox A Fib that has been assoc with vol overload and dyspnea. Also w pulm infilgtrates, ? 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 Has been in and out of hosp in Feb 2012 due to exacerbations, RVR, etc. Last d/c was 2/20. Feels that his HR is better controlled on Tikosyn.   Last visit we made plan to wean Pred to off (COP vs Amio tox). He feels that his breathing is stable, has been off the pred x 3 days. He is complaining of severe gluteal pain bilaterally radiating to each thigh, started about 3 weeks ago. Of note he started Pravachol about 3 weeks ago.   ROV 06/10/10 -- COPD, NSCLCA w resection, A Fib, ? 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 He follows up his dyspnea, gluteal myalgias. He has been off the prednisone for over a month, seems to be holding his own without the Pred. The myalgias are better off the pravachol, decided w Dr Burt Knack not to try an alternative.   ROV 07/10/10 -- follows up for COPD, restriction from lung resection. 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 He has been doing better since last visit a month ago. He stopped Symbicort as we had planned, doesn't seem to miss it. He is taking the Spiriva. Rare Albuterol use. May be having some increased cough and allergies.   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 Remains on Spiriva, has been off Symbicort for several months. Had CT scan 8/8 to follow NSCLCA - R effusion is smaller, small basilar nodule that will need follow up. He has been having problems with sneezing and coughing for the last several months. Clear nasal drainage. Has been taken off tykosyn since last visit. Currently in NSR.   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 Last CT chest was 09/18/10 for R basilar nodule. Needs to requalify for o2. Last time we initiated trial off Spiriva. He doesn't believe  he missed it. Never uses or needs the albuterol. He remains active, cleaned the gutters this week w O2 on!.   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 CT scan repeated in 03/2011, stable compared with priors. Now off of BD's and seems to be tolerating well. He continues to do well, has been able to stay active. He often has been able to go without O2 at rest. 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 CT scan repeated in 03/2011, stable compared with priors. Now off of BD's and seems to be tolerating well. He has stable DOE - never thought BD's helped him. Having nasal drainage on  guaifenesin. Stopped fluticasone spray. He is using albuterol prn - rarely uses. Still on lisinopril,  very rare cough.   Acute OV 02/26/12 --  Complains of  dry cough x 1 day,alot at night,wife on Tamiflu. sob-same right now, weakness, no wheezing, no cp or tightness,temp 102 now with xtra strength  Tylenol.  Wife began with URI symptoms , seen by PCP this week , dx w/ flu (no swab)  , rx Tamiflu Yesterday he began with dry cough and fever -tmax 102. Body aches today .  No wheezing or increased dypsnea.  No  hemoptyiss. Appetite is fair. No n/v.  No edema.   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 CT scan repeated in 03/2011, stable compared with priors. Had been on BD's (he never believed he benefited) and tolerating as of last visit 10/13, but since then has had problems >> treated for an AE in January as above by TP. Also followed with Dr Burt Knack >> TTE from 12/13 with estimated peak PAP 81 mmHg (was 63 in 10/2009, normal in 11/09). His CT scan 11/13 without significant ILD, but severe emphysematous changes. CXR today with chronic R vol loss and effusion (post-lobectomy). He feels better now from the acute exacerbation. He knows  that he desaturates w exertion >> to 85% with changing clothes or showering.   ROV 04/29/12 -- COPD, NSCLCA s/p resection, hx COP, multifactorial secondary PAH, hypoxemia. Last time, given his significant emphysema on Ct scan and his FEV1, decide to retry BD's >> started [place] + Breo. He still has SOB with changing clothes, etc. Wearing 2L/min, up to 4L/min w most exertion. He has been having itching all over (no rash)  Since starting the BD's. Hasn't really tested his exertional tolerance.   ROV 06/02/12 -- COPD, NSCLCA s/p resection, hx COP, multifactorial secondary PAH, hypoxemia. Given his significant emphysema on Ct scan and his FEV1, decided to retry BD's in 3/'14 >> started [place]. We stopped the [place] last visit after he developed itching. He stayed on [place], is still unclear whether the [place] helps. He is limited at baseline, cannot exercise. He is having frequent aspiration sx - "gets choked".   ROV 11/29/12 -- COPD, NSCLCA s/p resection, hx COP, multifactorial secondary PAH, hypoxemia. He also has A Fib, has had cardioversions but these didn't last, currently on metoprolol.  His COPD regimen is albuterol prn which he rarely uses. His Breo was stopped as a potential contributor to his tachycardia - he wasn't sure it it was helping him.   ROV 02/16/13 - COPD, NSCLCA s/p resection, hx COP, multifactorial secondary PAH, hypoxemia. Started prednisone taper for dyspnea on 1/2, some cough. Cough is a bit better, but dyspnea may be worse. He notes that his BP and Afib have worsened some since the pred was started. He is planning to evaluated for an ablation. His metoprolol has been increased.    CAT Score 04/29/2012 03/18/2012  Total CAT Score 8 11       Objective:   Physical Exam Filed Vitals:   02/16/13 1519  BP: 130/80  Pulse: 120  Height: 5' 11.5" (1.816 m)  Weight: 175 lb (79.379 kg)  SpO2: 90%   Gen: Pleasant, chronically ill, normal affect, comfortable on O2  ENT: No lesions,   mouth clear,  oropharynx clear, no postnasal drip  Neck: No JVD, no TMG, no carotid bruits  Lungs: No use of accessory muscles, decreased BS in bases , no wheezing or crackles .   Cardiovascular:  irreg irreg, audible click.   Musculoskeletal: No deformities, no cyanosis or clubbing  Neuro: alert, non focal  Skin: Warm, no lesions or rashes   CXR 09/19/12 --  Clinical Data: 80 year old male with tachycardia, atrial  fibrillation and shortness of breath.  PORTABLE CHEST - 1 VIEW  Comparison: 03/18/2012 and prior chest radiographs  Findings: Cardiomegaly and cardiac surgical changes noted.  Right basilar atelectasis/scarring again noted.  There may be a very small right pleural effusion present.  Minimal interstitial opacities are noted and may represent minimal  interstitial pulmonary edema.  There is no evidence of pneumothorax.  IMPRESSION:  Right basilar atelectasis/scarring again noted with possible small  right pleural effusion.  Minimal interstitial opacities - minimal interstitial edema not  excluded    Assessment & Plan:   COPD His PFT would suggest that he should be on scheduled BD's, but he has never definitely benefited. His Breo was stopped for lack of effectiveness and for increased HR continue albuterol prn Complete pred  CARCINOMA, LUNG, NONSMALL CELL CXR today

## 2013-02-16 NOTE — Patient Instructions (Addendum)
Please continue your prednisone to completion Continue your albuterol as needed Use tylenol cold & flu and mucinex as needed Wear your oxygen on 3L/min Follow with Dr Lamonte Sakai in 2 months or sooner if you have any problems.

## 2013-02-16 NOTE — Assessment & Plan Note (Addendum)
His PFT would suggest that he should be on scheduled BD's, but he has never definitely benefited. His Breo was stopped for lack of effectiveness and for increased HR continue albuterol prn Complete pred

## 2013-02-16 NOTE — Assessment & Plan Note (Signed)
CXR today.  

## 2013-02-24 ENCOUNTER — Telehealth: Payer: Self-pay | Admitting: Cardiovascular Disease

## 2013-02-24 NOTE — Telephone Encounter (Signed)
New problem    For 3 days now pt has been having SOB from doing anything.  Please give him a call back.  Pt would like to be seen.

## 2013-02-24 NOTE — Telephone Encounter (Signed)
Pt states he had a change in his SOB about 3 days ago. He does have minimal left ankle edema. He states there are times when his heart rate is 125-130, but he does not think it is at fib. His oxygen has been 97-98%. He recently saw Dr Lamonte Sakai. His CXR done 02/16/13 did not show signs of CHF. Pt  thinks his recent change in SOB is cardiac and not pulmonary.

## 2013-02-24 NOTE — Telephone Encounter (Signed)
He has an appt at [facility] in Feb for consideration of at fib ablation. He is requesting an appt in cardiology. I have scheduled him to see Kathrene Alu 02/25/13.

## 2013-02-25 ENCOUNTER — Ambulatory Visit (INDEPENDENT_AMBULATORY_CARE_PROVIDER_SITE_OTHER): Payer: Medicare Other | Admitting: Nurse Practitioner

## 2013-02-25 ENCOUNTER — Encounter: Payer: Self-pay | Admitting: Nurse Practitioner

## 2013-02-25 ENCOUNTER — Ambulatory Visit (INDEPENDENT_AMBULATORY_CARE_PROVIDER_SITE_OTHER): Payer: Medicare Other

## 2013-02-25 ENCOUNTER — Ambulatory Visit (INDEPENDENT_AMBULATORY_CARE_PROVIDER_SITE_OTHER)
Admission: RE | Admit: 2013-02-25 | Discharge: 2013-02-25 | Disposition: A | Discharge status: Home / Self Care | Payer: Medicare Other | Source: Ambulatory Visit | Attending: Nurse Practitioner | Admitting: Nurse Practitioner

## 2013-02-25 ENCOUNTER — Other Ambulatory Visit: Payer: Self-pay | Admitting: *Deleted

## 2013-02-25 ENCOUNTER — Other Ambulatory Visit: Payer: Self-pay | Admitting: Nurse Practitioner

## 2013-02-25 VITALS — BP 100/70 | HR 109 | Ht 71.5 in | Wt 183.0 lb

## 2013-02-25 DIAGNOSIS — R0609 Other forms of dyspnea: Secondary | ICD-10-CM

## 2013-02-25 DIAGNOSIS — Z5181 Encounter for therapeutic drug level monitoring: Secondary | ICD-10-CM

## 2013-02-25 DIAGNOSIS — R0600 Dyspnea, unspecified: Secondary | ICD-10-CM

## 2013-02-25 DIAGNOSIS — Z954 Presence of other heart-valve replacement: Secondary | ICD-10-CM

## 2013-02-25 DIAGNOSIS — R0989 Other specified symptoms and signs involving the circulatory and respiratory systems: Secondary | ICD-10-CM

## 2013-02-25 DIAGNOSIS — R042 Hemoptysis: Secondary | ICD-10-CM

## 2013-02-25 DIAGNOSIS — I4891 Unspecified atrial fibrillation: Secondary | ICD-10-CM

## 2013-02-25 DIAGNOSIS — I359 Nonrheumatic aortic valve disorder, unspecified: Secondary | ICD-10-CM

## 2013-02-25 DIAGNOSIS — I10 Essential (primary) hypertension: Secondary | ICD-10-CM

## 2013-02-25 DIAGNOSIS — Z7901 Long term (current) use of anticoagulants: Secondary | ICD-10-CM

## 2013-02-25 LAB — BASIC METABOLIC PANEL
BUN: 18 mg/dL (ref 6–23)
CO2: 32 mEq/L (ref 19–32)
Calcium: 9.1 mg/dL (ref 8.4–10.5)
Chloride: 99 mEq/L (ref 96–112)
Creatinine, Ser: 1 mg/dL (ref 0.4–1.5)
GFR: 77.76 mL/min (ref >60.00)
Glucose, Bld: 106 mg/dL — ABNORMAL HIGH (ref 70–99)
Potassium: 3.8 mEq/L (ref 3.5–5.1)
Sodium: 139 mEq/L (ref 135–145)

## 2013-02-25 LAB — CBC WITH DIFFERENTIAL/PLATELET
Basophils Absolute: 0 10*3/uL (ref 0.0–0.1)
Basophils Relative: 0 % (ref 0.0–3.0)
Eosinophils Absolute: 0.1 10*3/uL (ref 0.0–0.7)
Eosinophils Relative: 0.6 % (ref 0.0–5.0)
HCT: 44 % (ref 39.0–52.0)
Hemoglobin: 14.2 g/dL (ref 13.0–17.0)
Lymphocytes Relative: 12 % (ref 12.0–46.0)
Lymphs Abs: 2 10*3/uL (ref 0.7–4.0)
MCHC: 32.4 g/dL (ref 30.0–36.0)
MCV: 99.3 fl (ref 78.0–100.0)
Monocytes Absolute: 0.9 10*3/uL (ref 0.1–1.0)
Monocytes Relative: 5.3 % (ref 3.0–12.0)
Neutro Abs: 13.6 10*3/uL — ABNORMAL HIGH (ref 1.4–7.7)
Neutrophils Relative %: 82.1 % — ABNORMAL HIGH (ref 43.0–77.0)
Platelets: 185 10*3/uL (ref 150.0–400.0)
RBC: 4.43 Mil/uL (ref 4.22–5.81)
RDW: 15.4 % — ABNORMAL HIGH (ref 11.5–14.6)
WBC: 16.6 10*3/uL — ABNORMAL HIGH (ref 4.5–10.5)

## 2013-02-25 LAB — BRAIN NATRIURETIC PEPTIDE: Pro B Natriuretic peptide (BNP): 469 pg/mL — ABNORMAL HIGH (ref 0.0–100.0)

## 2013-02-25 LAB — POCT INR: INR: 2.9

## 2013-02-25 MED ORDER — LEVOFLOXACIN 500 MG PO TABS: 500.0000 mg | ORAL_TABLET | Freq: Every day | ORAL | Status: DC

## 2013-02-25 MED ORDER — DILTIAZEM HCL ER COATED BEADS 120 MG PO CP24
120.0000 mg | ORAL_CAPSULE | Freq: Every day | ORAL | Status: DC
Start: 2013-02-25 — End: 2013-04-12

## 2013-02-25 MED ORDER — IOHEXOL 300 MG/ML  SOLN
80.0000 mL | Freq: Once | INTRAMUSCULAR | Status: AC | PRN
  Administered 2013-02-25: 80 mL via INTRAVENOUS

## 2013-02-25 MED ORDER — FUROSEMIDE 40 MG PO TABS: 60.0000 mg | ORAL_TABLET | Freq: Two times a day (BID) | ORAL | Status: DC

## 2013-02-25 NOTE — Patient Instructions (Addendum)
We need to check labs today  We will arrange for an ultrasound of your heart  We will check a CT of your chest  We will check your INR today  I am adding Diltiazem 120 mg daily - to help slow your heart rate down - this is at the drug store.   I am increasing your Lasix to 60 mg two times a day - this should help with your swelling

## 2013-02-25 NOTE — Progress Notes (Signed)
John [name] Date of Birth: 1934/02/04 Medical Record #[id number]  History of Present Illness: John [name] is seen back today for a work in visit. Seen for Dr. Burt Knack. He is a 80 year old male with multiple issues - these include AS with remote AVR with a St. Jude mechanical valve, lung cancer with past lobectomy in 2011, chronic dyspnea, COPD, atrial fib that has been difficult to manage - failed on amiodarone (pulmonary toxicity) and failed on Tikosyn (prolonged QT) and could not maintain sinus on Multaq. He has had multiple cardioversions. Has been referred to [facility] for an ablation. Remains on coumadin. He remains on oxygen. Last echo from 2013 showed severe pulmonary HTN.   Seen back in September by Dr. Lovena Le - his recommendation was as follows: Unfortunately, we have no good antiarrhythmic drug choices for Mr. Moree. Initially I thought about AV node ablation and pacemaker insertion, but I am not convinced that his rates would justify this approach. For now, I would recommend continue rate control which is not perfect, but based on his 48 hour Holter monitor, fairly good. I'll plan to see the patient back on an as-needed basis. I plan to discuss the treatment recommendations with his primary cardiologist. I've asked the patient to stop taking his antiarrhythmic drug. Finally, I've asked the patient to stop taking metoprolol, and restart bisoprolol, as he tolerated this better in the past.  Last seen here a month ago - remained in atrial fib - rate not controlled - metoprolol was increased - and he was referred to [facility] for consideration of an ablation per his request. His ACE was stopped due to low blood pressure. Multiple EKGS since August reviewed - all show atrial fib/flutter. Last chest CT back in May of 2013. Previously followed by Dr. Arlyce Dice.   Called yesterday due to a "change in his SOB" for 3 days. Minimal ankle edema. HR up to 125 to 130 at times. Had seen Dr. Malvin Johns earlier this month  and CXR was negative for CHF. Thus added to my schedule.  Comes in today. Here with his wife. He is in a wheelchair. Has really deteriorated over the past 3 weeks. Weak. More short of breath. Treated with prednisone and over the counter cold medicine. No real improvement noted. CXR with no evidence of CHF or pneumonia. Bronchitis could not be ruled out. CT scan suggested. He says he has coughed up some blood. Nasal drainage bloody. Still short of breath. HR is "all over the place". More swelling as well. Weight is up. No actual fever that he knows of. Some aching but not really have general body aches. Going to [facility] in February - this sounds like it is for a consultation first. No planned tests reported for that visit. No one has been sick at home.   Current Outpatient Prescriptions  Medication Sig Dispense Refill  . acetaminophen (TYLENOL) 500 MG tablet Take 1,000 mg by mouth every 6 (six) hours as needed for pain.       Marland Kitchen albuterol (VENTOLIN HFA) 108 (90 BASE) MCG/ACT inhaler Inhale 2 puffs into the lungs every 4 (four) hours as needed.  1 Inhaler  6  . allopurinol (ZYLOPRIM) 100 MG tablet 1 tablet daily.      Marland Kitchen aspirin 81 MG tablet Take 81 mg by mouth daily.        . famotidine (PEPCID) 20 MG tablet Take 20 mg by mouth daily as needed for heartburn.       . furosemide (  LASIX) 40 MG tablet Take 1 tablet by mouth twice daily  60 tablet  9  . guaiFENesin (MUCINEX) 600 MG 12 hr tablet Take 2 tablets (1,200 mg total) by mouth as needed.  120 suppository  11  . ipratropium (ATROVENT) 0.03 % nasal spray USE 2 SPRAYS IN EACH NOSTRIL EVERY 12 HOURS.  30 mL  5  . metoprolol (LOPRESSOR) 100 MG tablet Take 1 tablet (100 mg total) by mouth 2 (two) times daily.  180 tablet  3  . NON FORMULARY 2 L. Oxygen continuous       . NON FORMULARY Place 3 L into the nose daily.      . potassium chloride SA (K-DUR,KLOR-CON) 20 MEQ tablet Take 1 tablet (20 mEq total) by mouth daily.  30 tablet  6  . warfarin (COUMADIN)  2.5 MG tablet TAKE 1 TABLET AS DIRECTED BY COUMADIN CLINIC.  30 tablet  3  . warfarin (COUMADIN) 5 MG tablet Take as directed by coumadin clinic  60 tablet  1   No current facility-administered medications for this visit.    Allergies  Allergen Reactions  . Penicillins     REACTION: rash/hives, facial swelling    Past Medical History  Diagnosis Date  . PAF (paroxysmal atrial fibrillation)     a. intolerant to amio (? lung toxicity) and tikosyn (prolonged QT);  b. 07/2012 s/p DCCV;  c. 09/2012 s/p DCCV - Multaq initiated.  . Aortic valve disorders     a. s/p AVR, 2006;  b. chronic coumadin.  . Other primary cardiomyopathies   . Edema     CHRONIC LOWER EXTREMITY  . Unspecified essential hypertension   . Other and unspecified hyperlipidemia   . Heart valve replaced by other means   . Other malaise and fatigue   . Coronary atherosclerosis of native coronary artery   . Acute myocardial infarction, subendocardial infarction, subsequent episode of care   . Unspecified adverse effect of unspecified drug, medicinal and biological substance   . Umbilical hernia without mention of obstruction or gangrene   . Sebaceous cyst   . Labyrinthitis, unspecified   . Personal history of other diseases of digestive system   . Special screening for malignant neoplasm of prostate   . BC (bronchogenic carcinoma)     S/P LOBECTOMY 2011  . Shortness of breath   . COPD (chronic obstructive pulmonary disease)   . GERD (gastroesophageal reflux disease)     Past Surgical History  Procedure Laterality Date  . Aortic valve replacement  1994    [facility]  . Appendectomy  [facility]  . Abdominal hernia repair    . Lobectomy  2011  . Cardioversion N/A 08/03/2012    Procedure: CARDIOVERSION;  Surgeon: Sherren Mocha, MD;  Location: [facility];  Service: Cardiovascular;  Laterality: N/A;  . Eye surgery      retinal repair  . Cardioversion N/A 09/20/2012    Procedure: CARDIOVERSION;  Surgeon: Evans Lance,  MD;  [facility];  Service: Cardiovascular;  Laterality: N/A;    History  Smoking status  . Former Smoker -- 1.00 packs/day for 40 years  . Types: Cigarettes  . Quit date: 02/10/2009  Smokeless tobacco  . Never Used    History  Alcohol Use  . Yes    Comment: socially "once in a while"    Family History  Problem Relation Age of Onset  . Colon cancer Father   . Aneurysm Mother     RUPTURE-NECK   .  Coronary artery disease Mother     Review of Systems: The review of systems is per the HPI.  All other systems were reviewed and are negative.  Physical Exam: BP 100/70  Pulse 109  Ht 5' 11.5" (1.816 m)  Wt 183 lb (83.008 kg)  BMI 25.17 kg/m2  SpO2 94% Patient is alert and in no acute distress. Looks quite chronically ill. In a wheelchair. Skin is warm and dry. Color is normal.  HEENT is unremarkable. Normocephalic/atraumatic. PERRL. Sclera are nonicteric. Neck is supple. No masses. No JVD. Lungs are coarse. Cardiac exam shows an irregular rate and rhythm. Rate is about 110 by me. Valve sounds crisp. Abdomen is soft. Extremities are with 1 to 2+ edema. He does have support stockings in place. No gross neurologic deficits noted.  Wt Readings from Last 3 Encounters:  02/25/13 183 lb (83.008 kg)  02/16/13 175 lb (79.379 kg)  01/12/13 174 lb 6.4 oz (79.107 kg)     LABORATORY DATA: EKG today shows atrial fib with a rate of 109.   Lab Results  Component Value Date   WBC 10.9* 09/19/2012   HGB 15.6 09/19/2012   HCT 45.0 09/19/2012   PLT 222 09/19/2012   GLUCOSE 79 09/20/2012   CHOL 148 01/14/2012   TRIG 165.0* 01/14/2012   HDL 26.90* 01/14/2012   LDLDIRECT 87.7 12/30/2010   LDLCALC 88 01/14/2012   ALT 17 01/14/2012   AST 28 01/14/2012   NA 139 09/20/2012   K 4.5 09/20/2012   CL 102 09/20/2012   CREATININE 1.09 09/20/2012   BUN 19 09/20/2012   CO2 27 09/20/2012   TSH 0.195* 09/19/2012   PSA 4.95* 03/09/2007   INR 2.1 02/16/2013    Lab Results  Component Value Date   INR 2.1  02/16/2013   INR 2.7 01/26/2013   INR 4.7 01/12/2013   PROTIME 17.5 07/28/2008    Echo Study Conclusions from December 2013  - Left ventricle: The cavity size was normal. Wall thickness was increased in a pattern of mild LVH. Systolic function was normal. The estimated ejection fraction was in the range of 50% to 55%. Wall motion was normal; there were no regional wall motion abnormalities. Features are consistent with a pseudonormal left ventricular filling pattern, with concomitant abnormal relaxation and increased filling pressure (grade 2 diastolic dysfunction). - Aortic valve: A prosthesis was present and functioning normally. The prosthesis had a normal range of motion. The sewing ring appeared normal, had no rocking motion, and showed no evidence of dehiscence. Mean gradient: 82mm Hg (S). Peak gradient: 61mm Hg (S). - Mitral valve: Mild regurgitation. - Left atrium: The atrium was moderately to severely dilated. - Right atrium: The atrium was mildly dilated. - Tricuspid valve: Moderate regurgitation. - Pulmonary arteries: PA peak pressure: 32mm Hg (S).  Dg Chest 2 View  02/16/2013    IMPRESSION: There are chronic changes in the right lung which appears stable. There is no evidence of acute pneumonia on the right or on the left. There is no evidence of CHF. One cannot exclude acute bronchitis in the appropriate clinical setting. If the patient's symptoms persist and remain unexplained, follow-up chest CT scanning would be useful.   Electronically Signed   By: David  Martinique   On: 02/16/2013 16:32   Assessment / Plan:  1. Atrial fib - symptomatic - I am not convinced that all of his symptoms are coming from his AF - I do not see an EKG since August showing sinus rhythm. Rate  is not controlled. Looks like he is now in chronic atrial fib to me - Will add low dose CCB. He is planning on seeing EP at [facility] in early February for evaluation.   2. AVR  3. Chronic coumadin - recheck today  since he has had hemoptysis  4. Chronic dyspnea - most likely multifactorial due to his COPD, prior lung cancer with lobectomy, severe pulmonary HTN, diastolic dysfunction, etc. Remains on oxygen therapy. Now with hemoptysis - needs his echo updated. Needs a repeat CT of the chest. Will check labs today to include BNP. He has evidence of volume overload on exam - will increase his lasix.   Further disposition to follow. We will get his CT later today. Overall prognosis looks quite tenuous to me.   Patient is agreeable to this plan and will call if any problems develop in the interim.   Burtis Junes, RN, [place] [address] [place], [place]  [phone]

## 2013-02-28 ENCOUNTER — Other Ambulatory Visit: Payer: Self-pay

## 2013-02-28 ENCOUNTER — Ambulatory Visit (INDEPENDENT_AMBULATORY_CARE_PROVIDER_SITE_OTHER): Payer: Medicare Other | Admitting: Pharmacist

## 2013-02-28 ENCOUNTER — Ambulatory Visit (HOSPITAL_COMMUNITY): Payer: Medicare Other | Attending: Internal Medicine | Admitting: Cardiology

## 2013-02-28 DIAGNOSIS — I4891 Unspecified atrial fibrillation: Secondary | ICD-10-CM | POA: Insufficient documentation

## 2013-02-28 DIAGNOSIS — I272 Pulmonary hypertension, unspecified: Secondary | ICD-10-CM

## 2013-02-28 DIAGNOSIS — I4892 Unspecified atrial flutter: Secondary | ICD-10-CM | POA: Insufficient documentation

## 2013-02-28 DIAGNOSIS — R609 Edema, unspecified: Secondary | ICD-10-CM

## 2013-02-28 DIAGNOSIS — I059 Rheumatic mitral valve disease, unspecified: Secondary | ICD-10-CM | POA: Insufficient documentation

## 2013-02-28 DIAGNOSIS — Z954 Presence of other heart-valve replacement: Secondary | ICD-10-CM

## 2013-02-28 DIAGNOSIS — Z7901 Long term (current) use of anticoagulants: Secondary | ICD-10-CM

## 2013-02-28 DIAGNOSIS — R0989 Other specified symptoms and signs involving the circulatory and respiratory systems: Principal | ICD-10-CM | POA: Insufficient documentation

## 2013-02-28 DIAGNOSIS — I359 Nonrheumatic aortic valve disorder, unspecified: Secondary | ICD-10-CM

## 2013-02-28 DIAGNOSIS — I079 Rheumatic tricuspid valve disease, unspecified: Secondary | ICD-10-CM | POA: Insufficient documentation

## 2013-02-28 DIAGNOSIS — Z5181 Encounter for therapeutic drug level monitoring: Secondary | ICD-10-CM

## 2013-02-28 DIAGNOSIS — R0600 Dyspnea, unspecified: Secondary | ICD-10-CM

## 2013-02-28 DIAGNOSIS — I379 Nonrheumatic pulmonary valve disorder, unspecified: Secondary | ICD-10-CM | POA: Insufficient documentation

## 2013-02-28 DIAGNOSIS — R0609 Other forms of dyspnea: Secondary | ICD-10-CM | POA: Insufficient documentation

## 2013-02-28 LAB — POCT INR: INR: 4.4

## 2013-02-28 NOTE — Progress Notes (Signed)
Echocardiogram performed by [facility]

## 2013-03-01 ENCOUNTER — Telehealth: Payer: Self-pay | Admitting: Cardiovascular Disease

## 2013-03-01 NOTE — Telephone Encounter (Signed)
New Message  Pt returned call for ECHO results// Please call

## 2013-03-01 NOTE — Telephone Encounter (Signed)
Notified wife that results of echo had not been reviewed yet.  Will call them with results when reviewed.

## 2013-03-11 ENCOUNTER — Ambulatory Visit (INDEPENDENT_AMBULATORY_CARE_PROVIDER_SITE_OTHER): Payer: Medicare Other | Admitting: Nurse Practitioner

## 2013-03-11 ENCOUNTER — Encounter: Payer: Self-pay | Admitting: Cardiology

## 2013-03-11 ENCOUNTER — Encounter: Payer: Self-pay | Admitting: Nurse Practitioner

## 2013-03-11 VITALS — BP 90/60 | HR 94 | Ht 71.0 in | Wt 173.8 lb

## 2013-03-11 DIAGNOSIS — R0600 Dyspnea, unspecified: Secondary | ICD-10-CM

## 2013-03-11 DIAGNOSIS — I4891 Unspecified atrial fibrillation: Secondary | ICD-10-CM

## 2013-03-11 DIAGNOSIS — R0609 Other forms of dyspnea: Secondary | ICD-10-CM

## 2013-03-11 DIAGNOSIS — R0989 Other specified symptoms and signs involving the circulatory and respiratory systems: Secondary | ICD-10-CM

## 2013-03-11 LAB — BASIC METABOLIC PANEL
BUN: 13 mg/dL (ref 6–23)
CO2: 33 mEq/L — ABNORMAL HIGH (ref 19–32)
Calcium: 9 mg/dL (ref 8.4–10.5)
Chloride: 100 mEq/L (ref 96–112)
Creatinine, Ser: 0.9 mg/dL (ref 0.4–1.5)
GFR: 84.62 mL/min (ref >60.00)
Glucose, Bld: 88 mg/dL (ref 70–99)
Potassium: 3.5 mEq/L (ref 3.5–5.1)
Sodium: 140 mEq/L (ref 135–145)

## 2013-03-11 NOTE — Progress Notes (Signed)
John [name] Date of Birth: 1933/10/25 Medical Record #[id number]  History of Present Illness: John [name] is seen back today for a follow up visit. Seen for Dr. Burt Knack. He is a 80 year old male with multiple issues - these include AS with remote AVR with a St. Jude mechanical valve, lung cancer with past lobectomy in 2011, chronic dyspnea, COPD, atrial fib that has been difficult to manage - failed on amiodarone (pulmonary toxicity) and failed on Tikosyn (prolonged QT) and could not maintain sinus on Multaq. He has had multiple cardioversions. Has been referred to [facility] for an ablation - sees them in February. Remains on coumadin. He remains on oxygen. Last echo from 2013 showed severe pulmonary HTN.   Seen back in September by Dr. Lovena Le - his recommendation was as follows: Unfortunately, we have no good antiarrhythmic drug choices for Mr. Quale. Initially I thought about AV node ablation and pacemaker insertion, but I am not convinced that his rates would justify this approach. For now, I would recommend continue rate control which is not perfect, but based on his 48 hour Holter monitor, fairly good. I'll plan to see the patient back on an as-needed basis. I plan to discuss the treatment recommendations with his primary cardiologist. I've asked the patient to stop taking his antiarrhythmic drug. Finally, I've asked the patient to stop taking metoprolol, and restart bisoprolol, as he tolerated this better in the past.   Seen by Dr. Burt Knack back in December - remained in atrial fib - rate not controlled - metoprolol was increased - and he was referred to [facility] for consideration of an ablation per his request. His ACE was stopped due to low blood pressure. Multiple EKGS since August reviewed - all show atrial fib/flutter. Last chest CT back in May of 2013. Previously followed by Dr. Arlyce Dice for his lung cancer.   I saw him about 2 weeks ago with a "change in his SOB" for 3 days. Minimal ankle edema. HR up  to 125 to 130 at times. Had seen Dr. Malvin Johns earlier this month and CXR was negative for CHF. He was coughing up some blood. Nasal drainage bloody. Still short of breath. HR was "all over the place". More swelling as well. Weight was up. I updated his CT of the chest, his echo and gave him a course of antibiotics. I also put him on some low dose Diltiazem to help with rate control.  EF is lower by this most recent echo.   Comes back today. Here with his wife. Doing much better. States that this past Monday he "was back to his normal self". Breathing has improved. Swelling down. Weight is down 10 pounds. Does like salt. No chest pain. Notes that his heart rate has slowed down - even to the 70's.   Current Outpatient Prescriptions  Medication Sig Dispense Refill  . acetaminophen (TYLENOL) 500 MG tablet Take 1,000 mg by mouth every 6 (six) hours as needed for pain.       Marland Kitchen albuterol (VENTOLIN HFA) 108 (90 BASE) MCG/ACT inhaler Inhale 2 puffs into the lungs every 4 (four) hours as needed.  1 Inhaler  6  . allopurinol (ZYLOPRIM) 100 MG tablet 1 tablet daily.      Marland Kitchen aspirin 81 MG tablet Take 81 mg by mouth daily.        Marland Kitchen diltiazem (CARDIZEM CD) 120 MG 24 hr capsule Take 1 capsule (120 mg total) by mouth daily.  30 capsule  3  .  famotidine (PEPCID) 20 MG tablet Take 20 mg by mouth daily as needed for heartburn.       . furosemide (LASIX) 40 MG tablet Take 1.5 tablets (60 mg total) by mouth 2 (two) times daily. Take 1 tablet by mouth twice daily  90 tablet  9  . guaiFENesin (MUCINEX) 600 MG 12 hr tablet Take 2 tablets (1,200 mg total) by mouth as needed.  120 suppository  11  . ipratropium (ATROVENT) 0.03 % nasal spray USE 2 SPRAYS IN EACH NOSTRIL EVERY 12 HOURS.  30 mL  5  . metoprolol (LOPRESSOR) 100 MG tablet Take 1 tablet (100 mg total) by mouth 2 (two) times daily.  180 tablet  3  . NON FORMULARY Place 2.5 L into the nose daily.       . potassium chloride SA (K-DUR,KLOR-CON) 20 MEQ tablet Take 1 tablet  (20 mEq total) by mouth daily.  30 tablet  6  . warfarin (COUMADIN) 2.5 MG tablet TAKE 1 TABLET AS DIRECTED BY COUMADIN CLINIC.  30 tablet  3  . warfarin (COUMADIN) 5 MG tablet Take as directed by coumadin clinic  60 tablet  1   No current facility-administered medications for this visit.    Allergies  Allergen Reactions  . Penicillins     REACTION: rash/hives, facial swelling    Past Medical History  Diagnosis Date  . PAF (paroxysmal atrial fibrillation)     a. intolerant to amio (? lung toxicity) and tikosyn (prolonged QT);  b. 07/2012 s/p DCCV;  c. 09/2012 s/p DCCV - Multaq initiated.  . Aortic valve disorders     a. s/p AVR, 2006;  b. chronic coumadin.  . Other primary cardiomyopathies   . Edema     CHRONIC LOWER EXTREMITY  . Unspecified essential hypertension   . Other and unspecified hyperlipidemia   . Heart valve replaced by other means   . Other malaise and fatigue   . Coronary atherosclerosis of native coronary artery   . Acute myocardial infarction, subendocardial infarction, subsequent episode of care   . Unspecified adverse effect of unspecified drug, medicinal and biological substance   . Umbilical hernia without mention of obstruction or gangrene   . Sebaceous cyst   . Labyrinthitis, unspecified   . Personal history of other diseases of digestive system   . Special screening for malignant neoplasm of prostate   . BC (bronchogenic carcinoma)     S/P LOBECTOMY 2011  . Shortness of breath   . COPD (chronic obstructive pulmonary disease)   . GERD (gastroesophageal reflux disease)     Past Surgical History  Procedure Laterality Date  . Aortic valve replacement  1994    [facility]  . Appendectomy  [facility]  . Abdominal hernia repair    . Lobectomy  2011  . Cardioversion N/A 08/03/2012    Procedure: CARDIOVERSION;  Surgeon: Sherren Mocha, MD;  Location: [place];  Service: Cardiovascular;  Laterality: N/A;  . Eye surgery      retinal repair  . Cardioversion N/A  09/20/2012    Procedure: CARDIOVERSION;  Surgeon: Evans Lance, MD;  Location: [facility];  Service: Cardiovascular;  Laterality: N/A;    History  Smoking status  . Former Smoker -- 1.00 packs/day for 40 years  . Types: Cigarettes  . Quit date: 02/10/2009  Smokeless tobacco  . Never Used    History  Alcohol Use  . Yes    Comment: socially "once in a while"    Family History  Problem Relation Age of Onset  . Colon cancer Father   . Aneurysm Mother     RUPTURE-NECK   . Coronary artery disease Mother     Review of Systems: The review of systems is per the HPI.  All other systems were reviewed and are negative.  Physical Exam: BP 90/60  Pulse 94  Ht 5\' 11"  (1.803 m)  Wt 173 lb 12.8 oz (78.835 kg)  BMI 24.25 kg/m2  SpO2 83% Weight down 10 pounds. Patient is very pleasant and in no acute distress. He still looks chronically ill but he looks better today. Less dyspneic. O2 sat up to 87% - he had dropped to 83% with just short walking distance. Skin is warm and dry. Color is normal.  HEENT is unremarkable. Normocephalic/atraumatic. PERRL. Sclera are nonicteric. Neck is supple. No masses. No JVD. Lungs are clear. Cardiac exam shows an irregular rhythm. His rate is ok. Valve ok. Abdomen is soft. Extremities are with less edema. Gait and ROM are intact. No gross neurologic deficits noted.  Wt Readings from Last 3 Encounters:  03/11/13 173 lb 12.8 oz (78.835 kg)  02/25/13 183 lb (83.008 kg)  02/16/13 175 lb (79.379 kg)     LABORATORY DATA: PENDING  Lab Results  Component Value Date   WBC 16.6* 02/25/2013   HGB 14.2 02/25/2013   HCT 44.0 02/25/2013   PLT 185.0 02/25/2013   GLUCOSE 106* 02/25/2013   CHOL 148 01/14/2012   TRIG 165.0* 01/14/2012   HDL 26.90* 01/14/2012   LDLDIRECT 87.7 12/30/2010   LDLCALC 88 01/14/2012   ALT 17 01/14/2012   AST 28 01/14/2012   NA 139 02/25/2013   K 3.8 02/25/2013   CL 99 02/25/2013   CREATININE 1.0 02/25/2013   BUN 18 02/25/2013   CO2 32 02/25/2013    TSH 0.195* 09/19/2012   PSA 4.95* 03/09/2007   INR 4.4 02/28/2013   Echo Study Conclusions from December 2013  - Left ventricle: The cavity size was normal. Wall thickness was increased in a pattern of mild LVH. Systolic function was normal. The estimated ejection fraction was in the range of 50% to 55%. Wall motion was normal; there were no regional wall motion abnormalities. Features are consistent with a pseudonormal left ventricular filling pattern, with concomitant abnormal relaxation and increased filling pressure (grade 2 diastolic dysfunction). - Aortic valve: A prosthesis was present and functioning normally. The prosthesis had a normal range of motion. The sewing ring appeared normal, had no rocking motion, and showed no evidence of dehiscence. Mean gradient: 79mm Hg (S). Peak gradient: 71mm Hg (S). - Mitral valve: Mild regurgitation. - Left atrium: The atrium was moderately to severely dilated. - Right atrium: The atrium was mildly dilated. - Tricuspid valve: Moderate regurgitation. - Pulmonary arteries: PA peak pressure: 32mm Hg (S).  Dg Chest 2 View  02/16/2013 IMPRESSION: There are chronic changes in the right lung which appears stable. There is no evidence of acute pneumonia on the right or on the left. There is no evidence of CHF. One cannot exclude acute bronchitis in the appropriate clinical setting. If the patient's symptoms persist and remain unexplained, follow-up chest CT scanning would be useful. Electronically Signed By: David Martinique On: 02/16/2013 16:32   Ct Chest W Contrast  02/25/2013    IMPRESSION: 1. No acute findings in the chest to explain the patient's given symptoms. Ascites in the upper abdomen, which can be a cause of shortness of breath. 2. Postoperative changes of right lower lobectomy with  associated scarring and volume loss. Right hilar lymphoid tissue is difficult to compare to prior exams given lack of IV contrast. 3. Enlarged pulmonary arteries,  indicative of pulmonary arterial hypertension. 4. Marginal irregularity of the liver is indicative of cirrhosis.   Electronically Signed   By: Lorin Picket M.D.   On: 02/25/2013 16:15   Echo Study Conclusions from January 2015  - Left ventricle: The cavity size was normal. Wall thickness was increased in a pattern of moderate LVH. Systolic function was moderately reduced. The estimated ejection fraction was in the range of 35% to 40%. Diffuse hypokinesis. Moderate hypokinesis. - Aortic valve: A mechanical prosthesis was present. Valve area: 2.22cm^2(VTI). Valve area: 2.54cm^2 (Vmax). - Mitral valve: Mild regurgitation. - Left atrium: The atrium was moderately dilated. - Right ventricle: The cavity size was mildly dilated. Systolic function was moderately reduced. - Right atrium: The atrium was moderately dilated. - Tricuspid valve: Moderate regurgitation. - Pulmonary arteries: Systolic pressure was moderately to severely increased. PA peak pressure: 60mm Hg (S).    Assessment / Plan:   1. Atrial fib - ? symptomatic - I am still not convinced that all of his symptoms are coming from his AF - I have not see an EKG since August showing sinus rhythm. Looks to me that this is now a chronic rhythm - now on diltiazem and his rate is more controlled - for EP evaluation at [facility] in February.   2. AVR - ok by echo  3. Chronic coumadin - no more hemoptysis  4. Chronic dyspnea - most likely multifactorial due to his COPD, prior lung cancer with lobectomy, severe pulmonary HTN, diastolic dysfunction and now with systolic heart failure with EF down to 35 to 40% (?tachycardia mediated) etc. Remains on oxygen therapy. He has improved with better rate control for his AF, a round of antibiotics and an increase in his Lasix. I have left him on his current regimen. See him back in a month.  Overall prognosis is still quite tenuous.   Patient is agreeable to this plan and will call if any problems  develop in the interim.   Burtis Junes, RN, [place]  [address] [place]  [place], [place] [address]  [phone]

## 2013-03-11 NOTE — Patient Instructions (Addendum)
Stay on your current medicines  We need to recheck your kidney function today  I would like to see you in a month  Call and let us know what Dr. Omelia Blackwater says after your visit at [facility]  Call the [place] office at [phone] if you have any questions, problems or concerns.

## 2013-03-17 ENCOUNTER — Telehealth: Payer: Self-pay

## 2013-03-17 ENCOUNTER — Ambulatory Visit (INDEPENDENT_AMBULATORY_CARE_PROVIDER_SITE_OTHER): Payer: Medicare Other | Admitting: *Deleted

## 2013-03-17 DIAGNOSIS — Z954 Presence of other heart-valve replacement: Secondary | ICD-10-CM

## 2013-03-17 DIAGNOSIS — I4891 Unspecified atrial fibrillation: Secondary | ICD-10-CM

## 2013-03-17 DIAGNOSIS — I359 Nonrheumatic aortic valve disorder, unspecified: Secondary | ICD-10-CM

## 2013-03-17 DIAGNOSIS — Z7901 Long term (current) use of anticoagulants: Secondary | ICD-10-CM

## 2013-03-17 DIAGNOSIS — Z5181 Encounter for therapeutic drug level monitoring: Secondary | ICD-10-CM | POA: Insufficient documentation

## 2013-03-17 LAB — POCT INR: INR: 2.8

## 2013-03-17 MED ORDER — POTASSIUM CHLORIDE CRYS ER 20 MEQ PO TBCR: EXTENDED_RELEASE_TABLET | ORAL | Status: DC

## 2013-03-17 NOTE — Telephone Encounter (Signed)
Patient came to office requesting refill for Klor Con.Stated Truitt Merle NP increased to 20 meq twice a day.Refill sent to pharmacy.

## 2013-03-25 ENCOUNTER — Ambulatory Visit: Payer: Medicare Other | Admitting: Internal Medicine

## 2013-04-06 ENCOUNTER — Ambulatory Visit (INDEPENDENT_AMBULATORY_CARE_PROVIDER_SITE_OTHER): Payer: Medicare Other | Admitting: Emergency Medicine

## 2013-04-06 ENCOUNTER — Encounter: Payer: Self-pay | Admitting: Emergency Medicine

## 2013-04-06 ENCOUNTER — Ambulatory Visit (INDEPENDENT_AMBULATORY_CARE_PROVIDER_SITE_OTHER): Payer: Medicare Other | Admitting: *Deleted

## 2013-04-06 VITALS — BP 112/62 | HR 87 | Ht 70.5 in | Wt 179.0 lb

## 2013-04-06 DIAGNOSIS — Z5181 Encounter for therapeutic drug level monitoring: Secondary | ICD-10-CM

## 2013-04-06 DIAGNOSIS — I4891 Unspecified atrial fibrillation: Secondary | ICD-10-CM

## 2013-04-06 DIAGNOSIS — J4489 Other specified chronic obstructive pulmonary disease: Secondary | ICD-10-CM

## 2013-04-06 DIAGNOSIS — Z7901 Long term (current) use of anticoagulants: Secondary | ICD-10-CM

## 2013-04-06 DIAGNOSIS — Z954 Presence of other heart-valve replacement: Secondary | ICD-10-CM

## 2013-04-06 DIAGNOSIS — I359 Nonrheumatic aortic valve disorder, unspecified: Secondary | ICD-10-CM

## 2013-04-06 DIAGNOSIS — J449 Chronic obstructive pulmonary disease, unspecified: Secondary | ICD-10-CM

## 2013-04-06 LAB — POCT INR: INR: 2.5

## 2013-04-06 NOTE — Progress Notes (Signed)
Subjective:    Patient ID: John [name], male    DOB: 1933-04-08, 80 y.o.   MRN: [id number] HPI 80 yo complicated pt w hx COPD, NCSLCA s/p resection. Parox A Fib that has been assoc with vol overload and dyspnea. Also w pulm infilgtrates, ? 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 Has been in and out of hosp in Feb 2012 due to exacerbations, RVR, etc. Last d/c was 2/20. Feels that his HR is better controlled on Tikosyn.   Last visit we made plan to wean Pred to off (COP vs Amio tox). He feels that his breathing is stable, has been off the pred x 3 days. He is complaining of severe gluteal pain bilaterally radiating to each thigh, started about 3 weeks ago. Of note he started Pravachol about 3 weeks ago.   ROV 06/10/10 -- COPD, NSCLCA w resection, A Fib, ? 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 He follows up his dyspnea, gluteal myalgias. He has been off the prednisone for over a month, seems to be holding his own without the Pred. The myalgias are better off the pravachol, decided w Dr Burt Knack not to try an alternative.   ROV 07/10/10 -- follows up for COPD, restriction from lung resection. 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 He has been doing better since last visit a month ago. He stopped Symbicort as we had planned, doesn't seem to miss it. He is taking the Spiriva. Rare Albuterol use. May be having some increased cough and allergies.   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 Remains on Spiriva, has been off Symbicort for several months. Had CT scan 8/8 to follow NSCLCA - R effusion is smaller, small basilar nodule that will need follow up. He has been having problems with sneezing and coughing for the last several months. Clear nasal drainage. Has been taken off tykosyn since last visit. Currently in NSR.   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 Last CT chest was 09/18/10 for R basilar nodule. Needs to requalify for o2. Last time we initiated trial off Spiriva. He doesn't believe  he missed it. Never uses or needs the albuterol. He remains active, cleaned the gutters this week w O2 on!.   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 CT scan repeated in 03/2011, stable compared with priors. Now off of BD's and seems to be tolerating well. He continues to do well, has been able to stay active. He often has been able to go without O2 at rest. 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 CT scan repeated in 03/2011, stable compared with priors. Now off of BD's and seems to be tolerating well. He has stable DOE - never thought BD's helped him. Having nasal drainage on  guaifenesin. Stopped fluticasone spray. He is using albuterol prn - rarely uses. Still on lisinopril,  very rare cough.   Acute OV 02/26/12 --  Complains of  dry cough x 1 day,alot at night,wife on Tamiflu. sob-same right now, weakness, no wheezing, no cp or tightness,temp 102 now with xtra strength  Tylenol.  Wife began with URI symptoms , seen by PCP this week , dx w/ flu (no swab)  , rx Tamiflu Yesterday he began with dry cough and fever -tmax 102. Body aches today .  No wheezing or increased dypsnea.  No  hemoptyiss. Appetite is fair. No n/v.  No edema.   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 CT scan repeated in 03/2011, stable compared with priors. Had been on BD's (he never believed he benefited) and tolerating as of last visit 10/13, but since then has had problems >> treated for an AE in January as above by TP. Also followed with Dr Burt Knack >> TTE from 12/13 with estimated peak PAP 81 mmHg (was 63 in 10/2009, normal in 11/09). His CT scan 11/13 without significant ILD, but severe emphysematous changes. CXR today with chronic R vol loss and effusion (post-lobectomy). He feels better now from the acute exacerbation. He knows  that he desaturates w exertion >> to 85% with changing clothes or showering.   ROV 04/29/12 -- COPD, NSCLCA s/p resection, hx COP, multifactorial secondary PAH, hypoxemia. Last time, given his significant emphysema on Ct scan and his FEV1, decide to retry BD's >> started [place] + Breo. He still has SOB with changing clothes, etc. Wearing 2L/min, up to 4L/min w most exertion. He has been having itching all over (no rash)  Since starting the BD's. Hasn't really tested his exertional tolerance.   ROV 06/02/12 -- COPD, NSCLCA s/p resection, hx COP, multifactorial secondary PAH, hypoxemia. Given his significant emphysema on Ct scan and his FEV1, decided to retry BD's in 3/'14 >> started [place]. We stopped the [place] last visit after he developed itching. He stayed on [place], is still unclear whether the [place] helps. He is limited at baseline, cannot exercise. He is having frequent aspiration sx - "gets choked".   ROV 11/29/12 -- COPD, NSCLCA s/p resection, hx COP, multifactorial secondary PAH, hypoxemia. He also has A Fib, has had cardioversions but these didn't last, currently on metoprolol.  His COPD regimen is albuterol prn which he rarely uses. His Breo was stopped as a potential contributor to his tachycardia - he wasn't sure it it was helping him.   ROV 02/16/13 - COPD, NSCLCA s/p resection, hx COP, multifactorial secondary PAH, hypoxemia. Started prednisone taper for dyspnea on 1/2, some cough. Cough is a bit better, but dyspnea may be worse. He notes that his BP and Afib have worsened some since the pred was started. He is planning to evaluated for an ablation. His metoprolol has been increased.   ROV 04/06/13 -- COPD, NSCLCA s/p resection, hx COP, hx A Fib (variably rate controlled) multifactorial secondary PAH, hypoxemia. Cardiazem CD was added in January, lasix increased to 120mg  daily and he feels better.  He uses ProAir prn, his usage has increased especially at night. He has never been convinced  that he has benefited from scheduled BD's and he has been concerned that they have made his rhythm harder to manage. He has seen [facility] regarding a possible ablation vs amiodarone (he has been off this due to concerns for impacting lung disease). He underwent PFT at [facility] in pred for possible cardioversion or ablation.    CAT Score 04/29/2012 03/18/2012  Total CAT Score 8 11    Objective:   Physical Exam Filed Vitals:   04/06/13 1222  BP: 112/62  Pulse: 87  Height: 5' 10.5" (1.791 m)  Weight: 179 lb (81.194 kg)  SpO2: 90%   Gen: Pleasant, chronically ill, normal affect, comfortable on O2  ENT: No lesions,  mouth clear,  oropharynx clear, no postnasal drip  Neck: No JVD, no TMG, no carotid  bruits  Lungs: No use of accessory muscles, decreased BS in bases , no wheezing or crackles   Cardiovascular:  irreg irreg, audible click.   Musculoskeletal: No deformities, no cyanosis or clubbing  Neuro: alert, non focal  Skin: Warm, no lesions or rashes   CT scan chest 02/25/13 --  COMPARISON: DG CHEST 2 VIEW dated 02/16/2013; CT CHEST W/O CM dated  12/25/2011; CT CHEST W/O CM dated 04/08/2011; CT CHEST W/O CM dated  09/18/2010  FINDINGS:  Mediastinal lymph nodes measure up to 11 mm in the lower right  paratracheal station, as before. Right hilar lymphoid tissue  measures 12 mm. Comparison to prior examinations is difficult due to  lack of IV contrast. No left hilar or axillary adenopathy.  Atherosclerotic calcification of the arterial vasculature. Pulmonary  arteries are enlarged. Heart is enlarged. No pericardial effusion.  Moderate to severe centrilobular emphysema with bullous changes at  the apex of the right hemi thorax. Postoperative changes of right  lower lobectomy with pleural thickening posteriorly. Lungs are  otherwise clear. No pleural fluid. Airway is otherwise unremarkable.  Incidental imaging of the upper abdomen shows small ascites, new.  Liver margin is slightly irregular.  Stones are seen in the  gallbladder. Low-attenuation lesions in the kidneys measure up to  2.9 cm on the left. Statistically, cysts are likely. No worrisome  lytic or sclerotic lesions. Degenerative changes are seen in the  spine. Thoracotomy changes on the right.  IMPRESSION:  1. No acute findings in the chest to explain the patient's given  symptoms. Ascites in the upper abdomen, which can be a cause of  shortness of breath.  2. Postoperative changes of right lower lobectomy with associated  scarring and volume loss. Right hilar lymphoid tissue is difficult  to compare to prior exams given lack of IV contrast.  3. Enlarged pulmonary arteries, indicative of pulmonary arterial  hypertension.  4. Marginal irregularity of the liver is indicative of cirrhosis.      Assessment & Plan:   COPD He has been either unresponsive to or intolerant of long-acting BD's in the past. I will hold off for now. I am hopeful that we may be able to start if he gets his A fib under definitive control > they are discussing possible cardioversion/amiodarone vs ablation. He did have COP at one time, and we were never sure whether amiodarone was related to this. It was stopped empirically. If it were restarted then he would need to be watched closely for recurrent side effects.

## 2013-04-06 NOTE — Assessment & Plan Note (Signed)
He has been either unresponsive to or intolerant of long-acting BD's in the past. I will hold off for now. I am hopeful that we may be able to start if he gets his A fib under definitive control > they are discussing possible cardioversion/amiodarone vs ablation. He did have COP at one time, and we were never sure whether amiodarone was related to this. It was stopped empirically. If it were restarted then he would need to be watched closely for recurrent side effects.

## 2013-04-06 NOTE — Patient Instructions (Signed)
Please continue you medications as you are taking them  We will hold off on adding a scheduled inhaler medication at this time as it could potentially impact your heart rate.  Agree with evaluation for possible ablation or restarting amiodarone. If we do restart amiodarone you will need to be watched closely for pulmonary side effects.  Follow with Dr Lamonte Sakai in 2 months or sooner if you have any problems.

## 2013-04-12 ENCOUNTER — Ambulatory Visit (INDEPENDENT_AMBULATORY_CARE_PROVIDER_SITE_OTHER): Payer: Medicare Other | Admitting: Nurse Practitioner

## 2013-04-12 ENCOUNTER — Encounter: Payer: Self-pay | Admitting: Nurse Practitioner

## 2013-04-12 VITALS — BP 120/62 | HR 91 | Ht 70.5 in | Wt 176.1 lb

## 2013-04-12 DIAGNOSIS — I2510 Atherosclerotic heart disease of native coronary artery without angina pectoris: Secondary | ICD-10-CM

## 2013-04-12 DIAGNOSIS — R0602 Shortness of breath: Secondary | ICD-10-CM

## 2013-04-12 DIAGNOSIS — I10 Essential (primary) hypertension: Secondary | ICD-10-CM

## 2013-04-12 DIAGNOSIS — I359 Nonrheumatic aortic valve disorder, unspecified: Secondary | ICD-10-CM

## 2013-04-12 DIAGNOSIS — I4891 Unspecified atrial fibrillation: Secondary | ICD-10-CM

## 2013-04-12 MED ORDER — DILTIAZEM HCL ER COATED BEADS 120 MG PO CP24: 120.0000 mg | ORAL_CAPSULE | Freq: Two times a day (BID) | ORAL | Status: DC

## 2013-04-12 MED ORDER — METOPROLOL TARTRATE 100 MG PO TABS: 50.0000 mg | ORAL_TABLET | Freq: Two times a day (BID) | ORAL | Status: AC

## 2013-04-12 NOTE — Patient Instructions (Addendum)
Stay on your current medicines but we will try cutting the metoprolol back to just 1/2 pill two times a day  Increase your diltiazem to two times a day  See Dr. Burt Knack in a month  I will send a note to Dr. Lamonte Sakai and Dr. Lossie Faes  Call the [place] office at [phone] if you have any questions, problems or concerns.

## 2013-04-12 NOTE — Progress Notes (Signed)
John [name] Date of Birth: 1933-10-13 Medical Record #[id number]  History of Present Illness: John [name] is seen back today for a one month visit. Seen for John [name] & John [name]. He is a 80 year old male with multiple issues - these include AS with remote AVR with a St. Jude mechanical valve, lung cancer with past lobectomy in 2011, chronic dyspnea, COPD, atrial fib that has been difficult to manage - failed on amiodarone (pulmonary toxicity) and failed on Tikosyn (prolonged QT) and could not maintain sinus on Multaq. He has had multiple cardioversions. Has been referred to [facility] for an ablation - sees them in February. Remains on coumadin. He remains on oxygen. Last echo from 2013 showed severe pulmonary HTN.   Seen back in September of 2014 by John [name] - his recommendation was as follows: Unfortunately, we have no good antiarrhythmic drug choices for John [name]. Initially I thought about AV node ablation and pacemaker insertion, but I am not convinced that his rates would justify this approach. For now, I would recommend continue rate control which is not perfect, but based on his 48 hour Holter monitor, fairly good. I'll plan to see the patient back on an as-needed basis. I plan to discuss the treatment recommendations with his primary cardiologist. I've asked the patient to stop taking his antiarrhythmic drug. Finally, I've asked the patient to stop taking metoprolol, and restart bisoprolol, as he tolerated this better in the past.   Seen by John [name] back in December of 2014 - remained in atrial fib - rate not controlled - metoprolol was increased - and he was referred to [facility] for consideration of an ablation per his request. [name] was stopped due to low blood pressure. Multiple EKGS since August reviewed - all show atrial fib/flutter. Last chest CT back in May of 2013. Previously followed by John [name] for his lung cancer.   I saw him about 6 weeks ago with a "change in his SOB" for 3  days. Minimal ankle edema. HR up to 125 to 130 at times. Had seen John [name] earlier and CXR was negative for CHF. He was coughing up some blood. Nasal drainage bloody. Still short of breath. HR was "all over the place". More swelling as well. Weight was up. I updated his CT of the chest, his echo and gave him a course of antibiotics. I also put him on some low dose Diltiazem to help with rate control. EF is lower by this most recent echo.   Seen back 4 weeks ago - was doing much better. Weight was down. HR better controlled. His overall prognosis felt to still be quite tenuous.  Comes back today. Here with his wife. They have seen John [name] since he was last here and have been to [place]. PFTS at [facility] are as noted:   Comments:  22#2 - PFTs are performed today to assess respiratory functional status in  427.31  Interpretation:  Spirometry with flow volume loop demonstrates severe airway obstruction.  Formal lung volume measurements not available. The reduced spirometric vital  capacity may reflect coexisting air trapping, lung restriction, or reduced  effort. The reduction in maximum voluntary ventilation reflects the reduced  FEV1. The diffusing capacity for carbon monoxide, a reflection of  alveolar-capillary gas transport, is substantially reduced. However, there is  also evidence for poor gas mixing during the diffusing capacity measurement,  and thus the analysis of carbon monoxide uptake must be interpreted with  caution.  Results reviewed and interpreted by John Lopes MD    John [name] recommendations were as follows:    John [name] has made the following changes in you medications: none   The following tests have been ordered to be done at [facility]: Lung function tests; console with the anesthesia service about how risky would be to undergo of the left atrial ablation  procedure   The following test will be requested to be done with your local physicians: John [name] will try to  get more detailed information about why amiodarone was discontinued to make  sure about whether or not this could be a drug option in the future   Please contact John [name] office if there is a change in your symptoms, or if you have any questions about your visit.   John [name] will contact you once he gets the anesthesia console note as well as the results of your lung function tests so he can discuss with you whether not you would be a  good candidate for the ablation procedure for atrial fibrillation.   Notes that for the most part he is doing ok. Very sedentary. Remains on his oxygen. He is still mainly limited by shortness of breath. He desperately wants to feel better. He notes that his heart rate is ok. He does not know what the plan is for him. He is frustrated. Swelling is stable. Weight has been stable. He wants to try cutting the beta blocker back and increasing his CCB. As I said, he is desperate to feel better. Tells me that ever since I put him on Diltiazem, gave him antibiotics and diuresed - that he does feel much better.   Current Outpatient Prescriptions  Medication Sig Dispense Refill  . acetaminophen (TYLENOL) 500 MG tablet Take 1,000 mg by mouth every 6 (six) hours as needed for pain.       Marland Kitchen albuterol (VENTOLIN HFA) 108 (90 BASE) MCG/ACT inhaler Inhale 2 puffs into the lungs every 4 (four) hours as needed.  1 Inhaler  6  . allopurinol (ZYLOPRIM) 100 MG tablet 1 tablet daily.      Marland Kitchen aspirin 81 MG tablet Take 81 mg by mouth daily.        Marland Kitchen diltiazem (CARDIZEM CD) 120 MG 24 hr capsule Take 1 capsule (120 mg total) by mouth daily.  30 capsule  3  . famotidine (PEPCID) 20 MG tablet Take 20 mg by mouth daily as needed for heartburn.       . furosemide (LASIX) 40 MG tablet Take 1.5 tablets (60 mg total) by mouth 2 (two) times daily. Take 1 tablet by mouth twice daily  90 tablet  9  . guaiFENesin (MUCINEX) 600 MG 12 hr tablet Take 2 tablets (1,200 mg total) by mouth as needed.   120 suppository  11  . ipratropium (ATROVENT) 0.03 % nasal spray USE 2 SPRAYS IN EACH NOSTRIL EVERY 12 HOURS.  30 mL  5  . metoprolol (LOPRESSOR) 100 MG tablet Take 1 tablet (100 mg total) by mouth 2 (two) times daily.  180 tablet  3  . NON FORMULARY Place 2.5 L into the nose daily.       . potassium chloride SA (KLOR-CON M20) 20 MEQ tablet Take 20 meq twice a day  60 tablet  6  . warfarin (COUMADIN) 2.5 MG tablet TAKE 1 TABLET AS DIRECTED BY COUMADIN CLINIC.  30 tablet  3  . warfarin (COUMADIN) 5 MG tablet Take as directed by coumadin clinic  60 tablet  1   No current facility-administered medications for this visit.    Allergies  Allergen Reactions  . Penicillins     REACTION: rash/hives, facial swelling    Past Medical History  Diagnosis Date  . PAF (paroxysmal atrial fibrillation)     a. intolerant to amio (? lung toxicity) and tikosyn (prolonged QT);  b. 07/2012 s/p DCCV;  c. 09/2012 s/p DCCV - Multaq initiated.  . Aortic valve disorders     a. s/p AVR, 2006;  b. chronic coumadin.  . Other primary cardiomyopathies   . Edema     CHRONIC LOWER EXTREMITY  . Unspecified essential hypertension   . Other and unspecified hyperlipidemia   . Heart valve replaced by other means   . Other malaise and fatigue   . Coronary atherosclerosis of native coronary artery   . Acute myocardial infarction, subendocardial infarction, subsequent episode of care   . Unspecified adverse effect of unspecified drug, medicinal and biological substance   . Umbilical hernia without mention of obstruction or gangrene   . Sebaceous cyst   . Labyrinthitis, unspecified   . Personal history of other diseases of digestive system   . Special screening for malignant neoplasm of prostate   . BC (bronchogenic carcinoma)     S/P LOBECTOMY 2011  . Shortness of breath   . COPD (chronic obstructive pulmonary disease)   . GERD (gastroesophageal reflux disease)     Past Surgical History  Procedure Laterality Date    . Aortic valve replacement  1994    [facility]  . Appendectomy  [facility]  . Abdominal hernia repair    . Lobectomy  2011  . Cardioversion N/A 08/03/2012    Procedure: CARDIOVERSION;  Surgeon: Sherren Mocha, MD;  Location: [facility];  Service: Cardiovascular;  Laterality: N/A;  . Eye surgery      retinal repair  . Cardioversion N/A 09/20/2012    Procedure: CARDIOVERSION;  Surgeon: Evans Lance, MD;  Location: [facility] OR;  Service: Cardiovascular;  Laterality: N/A;    History  Smoking status  . Former Smoker -- 1.00 packs/day for 40 years  . Types: Cigarettes  . Quit date: 02/10/2009  Smokeless tobacco  . Never Used    History  Alcohol Use  . Yes    Comment: socially "once in a while"    Family History  Problem Relation Age of Onset  . Colon cancer Father   . Aneurysm Mother     RUPTURE-NECK   . Coronary artery disease Mother     Review of Systems: The review of systems is per the HPI.  All other systems were reviewed and are negative.  Physical Exam: BP 120/62  Pulse 91  Ht 5' 10.5" (1.791 m)  Wt 176 lb 1.9 oz (79.888 kg)  BMI 24.91 kg/m2  SpO2 86% Patient is very pleasant and in no acute distress. He is quite chronically ill appearing. Skin is warm and dry. Color is normal.  Nailbeds are blue. Sats do drop with just talking. HEENT is unremarkable. Normocephalic/atraumatic. PERRL. Sclera are nonicteric. Neck is supple. No masses. No JVD. Lungs are clear. Cardiac exam shows an irregular rhythm. His rate is ok. Abdomen is soft. Extremities are with 1+ edema. Has his support stockings in place. Gait not tested.  No gross neurologic deficits noted.  Wt Readings from Last 3 Encounters:  04/12/13 176 lb 1.9 oz (79.888 kg)  04/06/13 179 lb (81.194 kg)  03/11/13 173 lb 12.8 oz (78.835 kg)  LABORATORY DATA:  Lab Results  Component Value Date   WBC 16.6* 02/25/2013   HGB 14.2 02/25/2013   HCT 44.0 02/25/2013   PLT 185.0 02/25/2013   GLUCOSE 88 03/11/2013   CHOL 148  01/14/2012   TRIG 165.0* 01/14/2012   HDL 26.90* 01/14/2012   LDLDIRECT 87.7 12/30/2010   LDLCALC 88 01/14/2012   ALT 17 01/14/2012   AST 28 01/14/2012   NA 140 03/11/2013   K 3.5 03/11/2013   CL 100 03/11/2013   CREATININE 0.9 03/11/2013   BUN 13 03/11/2013   CO2 33* 03/11/2013   TSH 0.195* 09/19/2012   PSA 4.95* 03/09/2007   INR 2.5 04/06/2013    Echo Study Conclusions from December 2013  - Left ventricle: The cavity size was normal. Wall thickness was increased in a pattern of mild LVH. Systolic function was normal. The estimated ejection fraction was in the range of 50% to 55%. Wall motion was normal; there were no regional wall motion abnormalities. Features are consistent with a pseudonormal left ventricular filling pattern, with concomitant abnormal relaxation and increased filling pressure (grade 2 diastolic dysfunction). - Aortic valve: A prosthesis was present and functioning normally. The prosthesis had a normal range of motion. The sewing ring appeared normal, had no rocking motion, and showed no evidence of dehiscence. Mean gradient: 80mm Hg (S). Peak gradient: 89mm Hg (S). - Mitral valve: Mild regurgitation. - Left atrium: The atrium was moderately to severely dilated. - Right atrium: The atrium was mildly dilated. - Tricuspid valve: Moderate regurgitation. - Pulmonary arteries: PA peak pressure: 58mm Hg (S).  Dg Chest 2 View  02/16/2013 IMPRESSION: There are chronic changes in the right lung which appears stable. There is no evidence of acute pneumonia on the right or on the left. There is no evidence of CHF. One cannot exclude acute bronchitis in the appropriate clinical setting. If the patient's symptoms persist and remain unexplained, follow-up chest CT scanning would be useful. Electronically Signed By: John [name] On: 02/16/2013 16:32   Ct Chest W Contrast  02/25/2013 IMPRESSION: 1. No acute findings in the chest to explain the patient's given symptoms. Ascites in the  upper abdomen, which can be a cause of shortness of breath. 2. Postoperative changes of right lower lobectomy with associated scarring and volume loss. Right hilar lymphoid tissue is difficult to compare to prior exams given lack of IV contrast. 3. Enlarged pulmonary arteries, indicative of pulmonary arterial hypertension. 4. Marginal irregularity of the liver is indicative of cirrhosis. Electronically Signed By: John [name] M.D. On: 02/25/2013 16:15    Echo Study Conclusions from January 2015  - Left ventricle: The cavity size was normal. Wall thickness was increased in a pattern of moderate LVH. Systolic function was moderately reduced. The estimated ejection fraction was in the range of 35% to 40%. Diffuse hypokinesis. Moderate hypokinesis. - Aortic valve: A mechanical prosthesis was present. Valve area: 2.22cm^2(VTI). Valve area: 2.54cm^2 (Vmax). - Mitral valve: Mild regurgitation. - Left atrium: The atrium was moderately dilated. - Right ventricle: The cavity size was mildly dilated. Systolic function was moderately reduced. - Right atrium: The atrium was moderately dilated. - Tricuspid valve: Moderate regurgitation. - Pulmonary arteries: Systolic pressure was moderately to severely increased. PA peak pressure: 32mm Hg (S).  Assessment / Plan:  1. Atrial fib - ? symptomatic - I continue to not be convinced that all of his symptoms are coming from his AF - I think this is from a combination of his heart failure, his AF,  his lung disease, past lung cancer/lobectomy, etc - he is desperate to feel better - he reiterates this numerous times to me. He does not seem to have a clear understanding of his issues and despite multiple discussions, I do not think I can get him to understand. He is anxiously awaiting word from John [name] and John [name]. I will have him see John [name] in a month to just make sure he does not "fall out of the fold". He is wanting to try and cut the metoprolol back - will  try 50 mg BID and increase the Diltiazem to BID.   2. AVR - ok by echo   3. Chronic coumadin - no more hemoptysis   4. Chronic dyspnea - most likely multifactorial due to his COPD, prior lung cancer with lobectomy, severe pulmonary HTN, diastolic dysfunction and now with systolic heart failure with EF down to 35 to 40% (?tachycardia mediated) etc. Remains on oxygen therapy. He has improved with better rate control for his AF, a round of antibiotics and an increase in his Lasix - although not to the level that he desires.   Overall prognosis is still quite tenuous.   Patient is agreeable to this plan and will call if any problems develop in the interim.   Burtis Junes, RN, [facility]  [address] [place]  [place], [place] [address]  [phone]

## 2013-04-16 ENCOUNTER — Other Ambulatory Visit: Payer: Self-pay | Admitting: Cardiovascular Disease

## 2013-04-27 ENCOUNTER — Ambulatory Visit (INDEPENDENT_AMBULATORY_CARE_PROVIDER_SITE_OTHER): Payer: Medicare Other | Admitting: Pharmacist

## 2013-04-27 ENCOUNTER — Telehealth: Payer: Self-pay | Admitting: *Deleted

## 2013-04-27 DIAGNOSIS — Z5181 Encounter for therapeutic drug level monitoring: Secondary | ICD-10-CM

## 2013-04-27 DIAGNOSIS — Z954 Presence of other heart-valve replacement: Secondary | ICD-10-CM

## 2013-04-27 DIAGNOSIS — I4891 Unspecified atrial fibrillation: Secondary | ICD-10-CM

## 2013-04-27 DIAGNOSIS — Z7901 Long term (current) use of anticoagulants: Secondary | ICD-10-CM

## 2013-04-27 DIAGNOSIS — I359 Nonrheumatic aortic valve disorder, unspecified: Secondary | ICD-10-CM

## 2013-04-27 LAB — POCT INR: INR: 3.4

## 2013-04-27 NOTE — Telephone Encounter (Signed)
Pt stopped me in the hallway asked if Cecille Rubin was here I stated yes with pt's stated wanted to know if pt could take extra dose of lasix, weight up 4 lbs, Cecille Rubin stated can take extra dose of lasix for two day than back to regular dose.  Pt is agreeable to plan

## 2013-05-03 ENCOUNTER — Telehealth: Payer: Self-pay | Admitting: Nurse Practitioner

## 2013-05-03 DIAGNOSIS — I10 Essential (primary) hypertension: Secondary | ICD-10-CM

## 2013-05-03 DIAGNOSIS — R0602 Shortness of breath: Secondary | ICD-10-CM

## 2013-05-03 MED ORDER — FUROSEMIDE 40 MG PO TABS: 80.0000 mg | ORAL_TABLET | Freq: Two times a day (BID) | ORAL | Status: DC

## 2013-05-03 NOTE — Telephone Encounter (Signed)
New message     Saw Cecille Rubin a few days ago, increased lasix, still swelling in legs and feet.  Please advise

## 2013-05-03 NOTE — Telephone Encounter (Signed)
Spoke with patient's wife, John [name], who states patient had swelling last week and was advised by Truitt Merle, NP to increase Lasix to 80 mg BID on the day he called and the next day, then to resume normal dose (60 mg) however patient took 160 mg (4 tabs) all in the am because he did not want to have to urinate frequently at night.  Patient's wife states patient continues to have swelling in the lower extremities from ankle up to close to his knee bilaterally; states he is not significantly SOB.  I advised patient's wife that per Dr. Burt Knack, patient needs to increase Lasix to 80 mg BID, and emphasized the importance of taking the medication in the morning at around 7-8 am and again at 4 pm.  Patient is currently taking 20 mEq of potassium BID and was not advised to increase this as patient's wife report patient takes in potassium-rich foods regularly.  Patient's wife verbalized agreement and understanding and per Dr. Burt Knack patient's appointment was moved up to 3/31 at 10:45.  At that time, a BNP and BMET will be obtained.  I advised patient's wife to call back if symptoms worsen or do not improve prior to office visit and she verbalized understanding and agreement.

## 2013-05-05 ENCOUNTER — Telehealth: Payer: Self-pay

## 2013-05-05 NOTE — Telephone Encounter (Signed)
error 

## 2013-05-05 NOTE — Telephone Encounter (Signed)
Called patient and spoke with the wife about his lasix directions she stated that he was taking two pills twice a day and that he had appt with Dr Copper on Tuesday. She states that he had enough lasix to last until Tuesday. I told her to make sure she get a refill on Tuesdays.

## 2013-05-10 ENCOUNTER — Ambulatory Visit (INDEPENDENT_AMBULATORY_CARE_PROVIDER_SITE_OTHER): Payer: Medicare Other | Admitting: Cardiovascular Disease

## 2013-05-10 ENCOUNTER — Other Ambulatory Visit (INDEPENDENT_AMBULATORY_CARE_PROVIDER_SITE_OTHER): Payer: Medicare Other

## 2013-05-10 ENCOUNTER — Encounter: Payer: Self-pay | Admitting: Cardiovascular Disease

## 2013-05-10 VITALS — BP 124/64 | HR 68 | Ht 70.5 in | Wt 186.0 lb

## 2013-05-10 DIAGNOSIS — I2510 Atherosclerotic heart disease of native coronary artery without angina pectoris: Secondary | ICD-10-CM

## 2013-05-10 DIAGNOSIS — R0602 Shortness of breath: Secondary | ICD-10-CM

## 2013-05-10 DIAGNOSIS — I4891 Unspecified atrial fibrillation: Secondary | ICD-10-CM

## 2013-05-10 DIAGNOSIS — I10 Essential (primary) hypertension: Secondary | ICD-10-CM

## 2013-05-10 LAB — BASIC METABOLIC PANEL
BUN: 15 mg/dL (ref 6–23)
CO2: 26 mEq/L (ref 19–32)
CREATININE: 1 mg/dL (ref 0.4–1.5)
Calcium: 9.4 mg/dL (ref 8.4–10.5)
Chloride: 102 mEq/L (ref 96–112)
GFR: 75.95 mL/min (ref >60.00)
Glucose, Bld: 129 mg/dL — ABNORMAL HIGH (ref 70–99)
Potassium: 4.3 mEq/L (ref 3.5–5.1)
Sodium: 140 mEq/L (ref 135–145)

## 2013-05-10 LAB — BRAIN NATRIURETIC PEPTIDE: PRO B NATRI PEPTIDE: 160 pg/mL — AB (ref 0.0–100.0)

## 2013-05-10 NOTE — Progress Notes (Signed)
HPI:  80 year old gentleman presenting for followup evaluation. He has a fairly complex cardiac history. The patient has aortic stenosis with remote mechanical St. Jude aortic valve replacement. He developed a lung cancer and underwent lobectomy in 2011. He's been limited by chronic dyspnea ever since that time. He does have chronic lung disease with COPD but was not significantly limited before his surgery., Postoperatively he developed atrial fibrillation. He was treated with amiodarone which resulted in further pulmonary toxicity. He was then treated with Tikosyn but this had to be discontinued because of significant QT prolongation even on very low doses.  The patient has undergone multiple cardioversions. He has been maintained on chronic anticoagulation with warfarin in the context of his mechanical aortic valve replacement and paroxysmal atrial fibrillation.  The patient was evaluated by electrophysiology at [facility](Dr [facility]) for consideration of atrial fibrillation ablation versus other antiarrhythmic options. Considerations have included ablation versus retrial of amiodarone.  He continues to feel poorly, primarily because of dyspnea. His heart rate has been better controlled after some medication adjustments made by Truitt Merle. He continues to have swelling in his legs. He denies chest pain or pressure. He complains of generalized fatigue and weakness.    Outpatient Encounter Prescriptions as of 05/10/2013  Medication Sig  . acetaminophen (TYLENOL) 500 MG tablet Take 1,000 mg by mouth every 6 (six) hours as needed for pain.   Marland Kitchen albuterol (VENTOLIN HFA) 108 (90 BASE) MCG/ACT inhaler Inhale 2 puffs into the lungs every 4 (four) hours as needed.  Marland Kitchen allopurinol (ZYLOPRIM) 100 MG tablet 1 tablet daily.  Marland Kitchen aspirin 81 MG tablet Take 81 mg by mouth daily.    Marland Kitchen diltiazem (CARDIZEM CD) 120 MG 24 hr capsule Take 1 capsule (120 mg total) by mouth 2 (two) times daily.  . famotidine (PEPCID) 20 MG  tablet Take 20 mg by mouth daily as needed for heartburn.   . furosemide (LASIX) 40 MG tablet Take 2 tablets (80 mg total) by mouth 2 (two) times daily. Take 1 tablet by mouth twice daily  . guaiFENesin (MUCINEX) 600 MG 12 hr tablet Take 2 tablets (1,200 mg total) by mouth as needed.  Marland Kitchen ipratropium (ATROVENT) 0.03 % nasal spray USE 2 SPRAYS IN EACH NOSTRIL EVERY 12 HOURS.  . metoprolol (LOPRESSOR) 100 MG tablet Take 0.5 tablets (50 mg total) by mouth 2 (two) times daily.  . NON FORMULARY Place 2.5 L into the nose daily.   . potassium chloride SA (KLOR-CON M20) 20 MEQ tablet Take 20 meq twice a day  . warfarin (COUMADIN) 2.5 MG tablet TAKE 1 TABLET AS DIRECTED BY COUMADIN CLINIC.  Marland Kitchen warfarin (COUMADIN) 5 MG tablet TAKE AS DIRECTED PER CLINIC    Allergies  Allergen Reactions  . Penicillins     REACTION: rash/hives, facial swelling    Past Medical History  Diagnosis Date  . PAF (paroxysmal atrial fibrillation)     a. intolerant to amio (? lung toxicity) and tikosyn (prolonged QT);  b. 07/2012 s/p DCCV;  c. 09/2012 s/p DCCV - Multaq initiated.  . Aortic valve disorders     a. s/p AVR, 2006;  b. chronic coumadin.  . Other primary cardiomyopathies   . Edema     CHRONIC LOWER EXTREMITY  . Unspecified essential hypertension   . Other and unspecified hyperlipidemia   . Heart valve replaced by other means   . Other malaise and fatigue   . Coronary atherosclerosis of native coronary artery   . Acute myocardial infarction,  subendocardial infarction, subsequent episode of care   . Unspecified adverse effect of unspecified drug, medicinal and biological substance   . Umbilical hernia without mention of obstruction or gangrene   . Sebaceous cyst   . Labyrinthitis, unspecified   . Personal history of other diseases of digestive system   . Special screening for malignant neoplasm of prostate   . BC (bronchogenic carcinoma)     S/P LOBECTOMY 2011  . Shortness of breath   . COPD (chronic  obstructive pulmonary disease)   . GERD (gastroesophageal reflux disease)     ROS: Negative except as per HPI  BP 124/64  Pulse 68  Ht 5' 10.5" (1.791 m)  Wt 186 lb (84.369 kg)  BMI 26.30 kg/m2  PHYSICAL EXAM: Pt is alert and oriented, chronically ill-appearing gentleman in NAD HEENT: normal Neck: JVP - normal, carotids 2+= without bruits Lungs: CTA bilaterally CV: irregular with normal mechanical aortic closure sounds Abd: soft, NT, Positive BS, no hepatomegaly Ext: 2+ ankle edema on the left, 1+ on the right, distal pulses intact and equal Skin: warm/dry no rash  2D Echo 02/28/2013: Study Conclusions  - Left ventricle: The cavity size was normal. Wall thickness was increased in a pattern of moderate LVH. Systolic function was moderately reduced. The estimated ejection fraction was in the range of 35% to 40%. Diffuse hypokinesis. Moderate hypokinesis. - Aortic valve: A mechanical prosthesis was present. Valve area: 2.22cm^2(VTI). Valve area: 2.54cm^2 (Vmax). - Mitral valve: Mild regurgitation. - Left atrium: The atrium was moderately dilated. - Right ventricle: The cavity size was mildly dilated. Systolic function was moderately reduced. - Right atrium: The atrium was moderately dilated. - Tricuspid valve: Moderate regurgitation. - Pulmonary arteries: Systolic pressure was moderately to severely increased. PA peak pressure: 79mm Hg (S).  ASSESSMENT AND PLAN: 1. Permanent atrial fibrillation. Reviewed Dr Beckey Downing consult note. Pt seems less inclined to proceed with RFA at this point. He is still considering options. His heart rate control is improved. I think the likelihood of maintaining sinus rhythm is low. He has severe pulmonary HTN with cor pulmonale. His PFT's from [facility] were reviewed and he has severe obstructive disease with FEV1<1L and severely reduced DLCO. He continues to follow with Dr Lamonte Sakai. I have recommended that we continue with rate-control on his current Rx  and he will decide if he wants to explore options further with Dr Omelia Blackwater. My overall impression is that better control of his AF would help to a degree but I tried to stress to him again that he will clearly have long-term limitation related to dyspnea/hypoxemia (unrelated to AF).  2. aortic valve disease status post mechanical aVR. Continue anticoagulation with warfarin. Normal bowel function by echo.  3. Pulmonary hypertension. This is secondary to severe lung disease and chronic hypoxemia. Continue oxygen.  4. Cor pulmonale - continue current dosing of diuretics.  A metabolic panel was drawn today and is currently pending.  For follow-up I will see him back in 3 months.  Sherren Mocha 05/10/2013 1:56 PM

## 2013-05-10 NOTE — Patient Instructions (Addendum)
Your physician recommends that you continue on your current medications as directed. Please refer to the Current Medication list given to you today.  Your physician recommends that you schedule a follow-up appointment in: 3 months with Dr. Burt Knack - July 2015

## 2013-05-20 ENCOUNTER — Ambulatory Visit (INDEPENDENT_AMBULATORY_CARE_PROVIDER_SITE_OTHER): Payer: Medicare Other | Admitting: Pharmacist

## 2013-05-20 ENCOUNTER — Ambulatory Visit: Payer: Medicare Other | Admitting: Cardiovascular Disease

## 2013-05-20 DIAGNOSIS — Z5181 Encounter for therapeutic drug level monitoring: Secondary | ICD-10-CM

## 2013-05-20 DIAGNOSIS — I359 Nonrheumatic aortic valve disorder, unspecified: Secondary | ICD-10-CM

## 2013-05-20 DIAGNOSIS — I4891 Unspecified atrial fibrillation: Secondary | ICD-10-CM

## 2013-05-20 DIAGNOSIS — Z7901 Long term (current) use of anticoagulants: Secondary | ICD-10-CM

## 2013-05-20 DIAGNOSIS — Z954 Presence of other heart-valve replacement: Secondary | ICD-10-CM

## 2013-05-20 LAB — POCT INR: INR: 2.2

## 2013-05-20 MED ORDER — FUROSEMIDE 40 MG PO TABS: 80.0000 mg | ORAL_TABLET | Freq: Two times a day (BID) | ORAL | Status: AC

## 2013-06-02 ENCOUNTER — Ambulatory Visit (INDEPENDENT_AMBULATORY_CARE_PROVIDER_SITE_OTHER): Payer: Medicare Other

## 2013-06-02 ENCOUNTER — Ambulatory Visit (INDEPENDENT_AMBULATORY_CARE_PROVIDER_SITE_OTHER): Payer: Medicare Other | Admitting: Emergency Medicine

## 2013-06-02 ENCOUNTER — Encounter: Payer: Self-pay | Admitting: Emergency Medicine

## 2013-06-02 VITALS — BP 128/86 | HR 84 | Ht 71.0 in | Wt 177.0 lb

## 2013-06-02 DIAGNOSIS — J189 Pneumonia, unspecified organism: Secondary | ICD-10-CM

## 2013-06-02 DIAGNOSIS — I2510 Atherosclerotic heart disease of native coronary artery without angina pectoris: Secondary | ICD-10-CM

## 2013-06-02 DIAGNOSIS — Z5181 Encounter for therapeutic drug level monitoring: Secondary | ICD-10-CM

## 2013-06-02 DIAGNOSIS — I359 Nonrheumatic aortic valve disorder, unspecified: Secondary | ICD-10-CM

## 2013-06-02 DIAGNOSIS — I4891 Unspecified atrial fibrillation: Secondary | ICD-10-CM

## 2013-06-02 DIAGNOSIS — J449 Chronic obstructive pulmonary disease, unspecified: Secondary | ICD-10-CM

## 2013-06-02 DIAGNOSIS — Z7901 Long term (current) use of anticoagulants: Secondary | ICD-10-CM

## 2013-06-02 DIAGNOSIS — Z954 Presence of other heart-valve replacement: Secondary | ICD-10-CM

## 2013-06-02 LAB — POCT INR: INR: 3.2

## 2013-06-02 NOTE — Patient Instructions (Signed)
We will not start any new inhaled medications at this time.  Continue to use albuterol 2 puffs if needed for shortness of breath.  Please continue your oxygen. Our goal is to keep your oxygen saturations above 90%. Follow with Dr Lamonte Sakai in 3 months or sooner if you have any problems.

## 2013-06-02 NOTE — Assessment & Plan Note (Signed)
He has never believed that BD's have benefited him. I would like to try again at some point but his is very sensitive to tachycardia. He uses albuterol 2 puffs prn. Next time we will consider starting nebs to eliminate the possibility that he has not been taking the BD's correctly.

## 2013-06-02 NOTE — Assessment & Plan Note (Signed)
He was treated for COP, but considered amio toxicity (and amio was stopped). If he is re-challenged with amio we will need to follow closely for a recurrence.

## 2013-06-02 NOTE — Progress Notes (Signed)
Subjective:    Patient ID: John [name], male    DOB: 03-Aug-1933, 80 y.o.   MRN: [id number] HPI 80 yo complicated pt w hx COPD, NCSLCA s/p resection. Hx AVR and Parox A Fib that has been assoc with vol overload and dyspnea. Also w pulm infilgtrates, ? 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 Has been in and out of hosp in Feb 2012 due to exacerbations, RVR, etc. Last d/c was 2/20. Feels that his HR is better controlled on Tikosyn.   Last visit we made plan to wean Pred to off (COP vs Amio tox). He feels that his breathing is stable, has been off the pred x 3 days. He is complaining of severe gluteal pain bilaterally radiating to each thigh, started about 3 weeks ago. Of note he started Pravachol about 3 weeks ago.   ROV 06/10/10 -- COPD, NSCLCA w resection, A Fib, ? 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 He follows up his dyspnea, gluteal myalgias. He has been off the prednisone for over a month, seems to be holding his own without the Pred. The myalgias are better off the pravachol, decided w Dr Burt Knack not to try an alternative.   ROV 07/10/10 -- follows up for COPD, restriction from lung resection. 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 He has been doing better since last visit a month ago. He stopped Symbicort as we had planned, doesn't seem to miss it. He is taking the Spiriva. Rare Albuterol use. May be having some increased cough and allergies.   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 Remains on Spiriva, has been off Symbicort for several months. Had CT scan 8/8 to follow NSCLCA - R effusion is smaller, small basilar nodule that will need follow up. He has been having problems with sneezing and coughing for the last several months. Clear nasal drainage. Has been taken off tykosyn since last visit. Currently in NSR.   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 Last CT chest was 09/18/10 for R basilar nodule. Needs to requalify for o2. Last time we initiated trial off Spiriva. He  doesn't believe he missed it. Never uses or needs the albuterol. He remains active, cleaned the gutters this week w O2 on!.   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 CT scan repeated in 03/2011, stable compared with priors. Now off of BD's and seems to be tolerating well. He continues to do well, has been able to stay active. He often has been able to go without O2 at rest. 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 CT scan repeated in 03/2011, stable compared with priors. Now off of BD's and seems to be tolerating well. He has stable DOE - never thought BD's helped him. Having nasal drainage on  guaifenesin. Stopped fluticasone spray. He is using albuterol prn - rarely uses. Still on lisinopril,  very rare cough.   Acute OV 02/26/12 --  Complains of  dry cough x 1 day,alot at night,wife on Tamiflu. sob-same right now, weakness, no wheezing, no cp or tightness,temp 102 now with xtra strength  Tylenol.  Wife began with URI symptoms , seen by PCP this week , dx w/ flu (no swab)  , rx Tamiflu Yesterday he began with dry cough and fever -tmax 102. Body aches today .  No wheezing or increased  dypsnea.  No hemoptyiss. Appetite is fair. No n/v.  No edema.   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 CT scan repeated in 03/2011, stable compared with priors. Had been on BD's (he never believed he benefited) and tolerating as of last visit 10/13, but since then has had problems >> treated for an AE in January as above by TP. Also followed with Dr Burt Knack >> TTE from 12/13 with estimated peak PAP 81 mmHg (was 63 in 10/2009, normal in 11/09). His CT scan 11/13 without significant ILD, but severe emphysematous changes. CXR today with chronic R vol loss and effusion (post-lobectomy). He feels better now from the acute  exacerbation. He knows that he desaturates w exertion >> to 85% with changing clothes or showering.   ROV 04/29/12 -- COPD, NSCLCA s/p resection, hx COP, multifactorial secondary PAH, hypoxemia. Last time, given his significant emphysema on Ct scan and his FEV1, decide to retry BD's >> started [place] + Breo. He still has SOB with changing clothes, etc. Wearing 2L/min, up to 4L/min w most exertion. He has been having itching all over (no rash)  Since starting the BD's. Hasn't really tested his exertional tolerance.   ROV 06/02/12 -- COPD, NSCLCA s/p resection, hx COP, multifactorial secondary PAH, hypoxemia. Given his significant emphysema on Ct scan and his FEV1, decided to retry BD's in 3/'14 >> started [place]. We stopped the [place] last visit after he developed itching. He stayed on Arp, is still unclear whether the [place] helps. He is limited at baseline, cannot exercise. He is having frequent aspiration sx - "gets choked".   ROV 11/29/12 -- COPD, NSCLCA s/p resection, hx COP, multifactorial secondary PAH, hypoxemia. He also has A Fib, has had cardioversions but these didn't last, currently on metoprolol.  His COPD regimen is albuterol prn which he rarely uses. His Breo was stopped as a potential contributor to his tachycardia - he wasn't sure it it was helping him.   ROV 02/16/13 - COPD, NSCLCA s/p resection, hx COP, multifactorial secondary PAH, hypoxemia. Started prednisone taper for dyspnea on 1/2, some cough. Cough is a bit better, but dyspnea may be worse. He notes that his BP and Afib have worsened some since the pred was started. He is planning to evaluated for an ablation. His metoprolol has been increased.   ROV 04/06/13 -- COPD, NSCLCA s/p resection, hx COP, hx A Fib (variably rate controlled) multifactorial secondary PAH, hypoxemia. Cardiazem CD was added in January, lasix increased to 120mg  daily and he feels better.  He uses ProAir prn, his usage has increased especially at night. He  has never been convinced that he has benefited from scheduled BD's and he has been concerned that they have made his rhythm harder to manage. He has seen [place] regarding a possible ablation vs amiodarone (he has been off this due to concerns for impacting lung disease). He underwent PFT at [facility] in pred for possible cardioversion or ablation.   ROV 06/02/13 -- follow up for multifactorial dyspnea and PAH, hypoxemia. He has obstructive and restrictive disease. He has been under eval for possible ablation vs amiodarone, with the knowledge that his prior hx COP may have been amio toxicity. His most recent FEV1 in 2/'15 at [facility] was 0.98L. He feels better with improved HR. He is exercising more, still has SOB.    CAT Score 04/29/2012 03/18/2012  Total CAT Score 8  11    Objective:   Physical Exam Filed Vitals:   06/02/13 1157  BP: 128/86  Pulse: 84  Height: 5\' 11"  (1.803 m)  Weight: 177 lb (80.287 kg)  SpO2: 92%   Gen: Pleasant, chronically ill, normal affect, comfortable on O2  ENT: No lesions,  mouth clear,  oropharynx clear, no postnasal drip  Neck: No JVD, no TMG, no carotid bruits  Lungs: No use of accessory muscles, decreased BS in bases , no wheezing or crackles   Cardiovascular:  irreg irreg, audible click.   Musculoskeletal: No deformities, no cyanosis or clubbing  Neuro: alert, non focal  Skin: Warm, no lesions or rashes   CT scan chest 02/25/13 --  COMPARISON: DG CHEST 2 VIEW dated 02/16/2013; CT CHEST W/O CM dated  12/25/2011; CT CHEST W/O CM dated 04/08/2011; CT CHEST W/O CM dated  09/18/2010  FINDINGS:  Mediastinal lymph nodes measure up to 11 mm in the lower right  paratracheal station, as before. Right hilar lymphoid tissue  measures 12 mm. Comparison to prior examinations is difficult due to  lack of IV contrast. No left hilar or axillary adenopathy.  Atherosclerotic calcification of the arterial vasculature. Pulmonary  arteries are enlarged. Heart is enlarged. No  pericardial effusion.  Moderate to severe centrilobular emphysema with bullous changes at  the apex of the right hemi thorax. Postoperative changes of right  lower lobectomy with pleural thickening posteriorly. Lungs are  otherwise clear. No pleural fluid. Airway is otherwise unremarkable.  Incidental imaging of the upper abdomen shows small ascites, new.  Liver margin is slightly irregular. Stones are seen in the  gallbladder. Low-attenuation lesions in the kidneys measure up to  2.9 cm on the left. Statistically, cysts are likely. No worrisome  lytic or sclerotic lesions. Degenerative changes are seen in the  spine. Thoracotomy changes on the right.  IMPRESSION:  1. No acute findings in the chest to explain the patient's given  symptoms. Ascites in the upper abdomen, which can be a cause of  shortness of breath.  2. Postoperative changes of right lower lobectomy with associated  scarring and volume loss. Right hilar lymphoid tissue is difficult  to compare to prior exams given lack of IV contrast.  3. Enlarged pulmonary arteries, indicative of pulmonary arterial  hypertension.  4. Marginal irregularity of the liver is indicative of cirrhosis.      Assessment & Plan:   COPD He has never believed that BD's have benefited him. I would like to try again at some point but his is very sensitive to tachycardia. He uses albuterol 2 puffs prn. Next time we will consider starting nebs to eliminate the possibility that he has not been taking the BD's correctly.   PNEUMONITIS He was treated for COP, but considered amio toxicity (and amio was stopped). If he is re-challenged with amio we will need to follow closely for a recurrence.

## 2013-06-23 ENCOUNTER — Ambulatory Visit (INDEPENDENT_AMBULATORY_CARE_PROVIDER_SITE_OTHER): Payer: Medicare Other | Admitting: Pharmacist

## 2013-06-23 DIAGNOSIS — Z954 Presence of other heart-valve replacement: Secondary | ICD-10-CM

## 2013-06-23 DIAGNOSIS — I359 Nonrheumatic aortic valve disorder, unspecified: Secondary | ICD-10-CM

## 2013-06-23 DIAGNOSIS — Z7901 Long term (current) use of anticoagulants: Secondary | ICD-10-CM

## 2013-06-23 DIAGNOSIS — I4891 Unspecified atrial fibrillation: Secondary | ICD-10-CM

## 2013-06-23 DIAGNOSIS — Z5181 Encounter for therapeutic drug level monitoring: Secondary | ICD-10-CM

## 2013-06-23 LAB — POCT INR: INR: 2.7

## 2013-07-21 ENCOUNTER — Ambulatory Visit (INDEPENDENT_AMBULATORY_CARE_PROVIDER_SITE_OTHER): Payer: Medicare Other | Admitting: *Deleted

## 2013-07-21 DIAGNOSIS — Z5181 Encounter for therapeutic drug level monitoring: Secondary | ICD-10-CM

## 2013-07-21 DIAGNOSIS — I4891 Unspecified atrial fibrillation: Secondary | ICD-10-CM

## 2013-07-21 DIAGNOSIS — Z7901 Long term (current) use of anticoagulants: Secondary | ICD-10-CM

## 2013-07-21 DIAGNOSIS — I359 Nonrheumatic aortic valve disorder, unspecified: Secondary | ICD-10-CM

## 2013-07-21 DIAGNOSIS — Z954 Presence of other heart-valve replacement: Secondary | ICD-10-CM

## 2013-07-21 LAB — POCT INR: INR: 4.3

## 2013-07-22 ENCOUNTER — Telehealth: Payer: Self-pay

## 2013-07-22 ENCOUNTER — Telehealth: Payer: Self-pay | Admitting: Emergency Medicine

## 2013-07-22 MED ORDER — AZITHROMYCIN 250 MG PO TABS: ORAL_TABLET | ORAL | Status: DC

## 2013-07-22 MED ORDER — PREDNISONE 10 MG PO TABS: ORAL_TABLET | ORAL | Status: DC

## 2013-07-22 NOTE — Telephone Encounter (Signed)
Called and spoke with pt and he stated that his wife has been sick this week and he thinks that he caught this from her.  Having congestion, chest feels a little tight, raw throat, low grade fever.  This all started yesterday.  He stated that he is not able to get anything up out of his chest.  Pt is requesting something be called into the pharmacy.  RB please advise. Thanks  Allergies  Allergen Reactions  . Penicillins     REACTION: rash/hives, facial swelling     Current Outpatient Prescriptions on File Prior to Visit  Medication Sig Dispense Refill  . acetaminophen (TYLENOL) 500 MG tablet Take 1,000 mg by mouth every 6 (six) hours as needed for pain.       Marland Kitchen albuterol (VENTOLIN HFA) 108 (90 BASE) MCG/ACT inhaler Inhale 2 puffs into the lungs every 4 (four) hours as needed.  1 Inhaler  6  . allopurinol (ZYLOPRIM) 100 MG tablet 1 tablet daily.      Marland Kitchen aspirin 81 MG tablet Take 81 mg by mouth daily.        Marland Kitchen diltiazem (CARDIZEM CD) 120 MG 24 hr capsule Take 1 capsule (120 mg total) by mouth 2 (two) times daily.  60 capsule  3  . famotidine (PEPCID) 20 MG tablet Take 20 mg by mouth daily as needed for heartburn.       . furosemide (LASIX) 40 MG tablet Take 2 tablets (80 mg total) by mouth 2 (two) times daily.  120 tablet  6  . guaiFENesin (MUCINEX) 600 MG 12 hr tablet Take 2 tablets (1,200 mg total) by mouth as needed.  120 suppository  11  . ipratropium (ATROVENT) 0.03 % nasal spray USE 2 SPRAYS IN EACH NOSTRIL EVERY 12 HOURS.  30 mL  5  . metoprolol (LOPRESSOR) 100 MG tablet Take 0.5 tablets (50 mg total) by mouth 2 (two) times daily.  180 tablet  3  . NON FORMULARY Place 2.5 L into the nose daily.       . potassium chloride SA (KLOR-CON M20) 20 MEQ tablet Take 20 meq twice a day  60 tablet  6  . warfarin (COUMADIN) 2.5 MG tablet TAKE 1 TABLET AS DIRECTED BY COUMADIN CLINIC.  30 tablet  3  . warfarin (COUMADIN) 5 MG tablet TAKE AS DIRECTED PER CLINIC  60 tablet  1   No current  facility-administered medications on file prior to visit.

## 2013-07-22 NOTE — Telephone Encounter (Signed)
Pt was rx a zpack and a Prednisone taper today 07/22/13 40x3, 30x3, 20x3, 10x3, then stop. Advised pt's wife to have pt take 2.5mg  daily while on abx and Prednisone and made appt to check INR on Tuesday 07/26/13.

## 2013-07-22 NOTE — Telephone Encounter (Signed)
Pt aware of recs. RX's sent in. Nothing further needed

## 2013-07-22 NOTE — Telephone Encounter (Signed)
Unfortunately a lot of what he will need to take is symptomatic relief > decongestants, mucinex, tylenol. Please give him script for azithro + pred taper for him to start IF his sputum changes color, increases or he develops wheeze / dyspnea.   Z-pak + pred Take 40mg  daily for 3 days, then 30mg  daily for 3 days, then 20mg  daily for 3 days, then 10mg  daily for 3 days, then stop

## 2013-07-26 ENCOUNTER — Ambulatory Visit (INDEPENDENT_AMBULATORY_CARE_PROVIDER_SITE_OTHER): Payer: Medicare Other

## 2013-07-26 DIAGNOSIS — I4891 Unspecified atrial fibrillation: Secondary | ICD-10-CM

## 2013-07-26 DIAGNOSIS — Z5181 Encounter for therapeutic drug level monitoring: Secondary | ICD-10-CM

## 2013-07-26 DIAGNOSIS — Z7901 Long term (current) use of anticoagulants: Secondary | ICD-10-CM

## 2013-07-26 DIAGNOSIS — I359 Nonrheumatic aortic valve disorder, unspecified: Secondary | ICD-10-CM

## 2013-07-26 DIAGNOSIS — Z954 Presence of other heart-valve replacement: Secondary | ICD-10-CM

## 2013-07-26 LAB — POCT INR: INR: 2.5

## 2013-07-29 ENCOUNTER — Telehealth: Payer: Self-pay | Admitting: Emergency Medicine

## 2013-07-29 NOTE — Telephone Encounter (Signed)
Spoke with Peter Congo. She reports pt is having a lot of chest tx, increase SOB, wheezing. He will start taking pred 10 mg QD x 3 days tomorrow from pred tapper and he finished azithromycin yesterday. We have no openings in the office today. RB not on scheduled. Please advise Dr. Lake Bells thanks  Allergies  Allergen Reactions  . Penicillins     REACTION: rash/hives, facial swelling

## 2013-07-29 NOTE — Telephone Encounter (Signed)
Called spoke with pt. Aware of recs. He scheduled an appt to be seen Tuesday by tp at 10:15. Aware to seek emergency care if worsens.

## 2013-07-29 NOTE — Telephone Encounter (Signed)
He had a long taper of prednisone and antibiotics, he needs to be seen by someone: [place] (if available), PCP, urgent care, or ER

## 2013-08-02 ENCOUNTER — Encounter: Payer: Self-pay | Admitting: Adult Health

## 2013-08-02 ENCOUNTER — Ambulatory Visit (INDEPENDENT_AMBULATORY_CARE_PROVIDER_SITE_OTHER)
Admission: RE | Admit: 2013-08-02 | Discharge: 2013-08-02 | Disposition: A | Discharge status: Home / Self Care | Payer: Medicare Other | Source: Ambulatory Visit | Attending: Adult Health | Admitting: Adult Health

## 2013-08-02 ENCOUNTER — Telehealth: Payer: Self-pay | Admitting: Pharmacist

## 2013-08-02 ENCOUNTER — Ambulatory Visit (INDEPENDENT_AMBULATORY_CARE_PROVIDER_SITE_OTHER): Payer: Medicare Other | Admitting: Adult Health

## 2013-08-02 VITALS — BP 112/64 | HR 78 | Temp 98.2°F | Ht 71.0 in | Wt 171.0 lb

## 2013-08-02 DIAGNOSIS — I2510 Atherosclerotic heart disease of native coronary artery without angina pectoris: Secondary | ICD-10-CM

## 2013-08-02 DIAGNOSIS — J449 Chronic obstructive pulmonary disease, unspecified: Secondary | ICD-10-CM

## 2013-08-02 MED ORDER — LEVOFLOXACIN 500 MG PO TABS: 500.0000 mg | ORAL_TABLET | Freq: Every day | ORAL | Status: DC

## 2013-08-02 NOTE — Assessment & Plan Note (Signed)
Flare  Check cxr   Plan  Levaquin 500mg  daily for 7 days .  Mucinex DM Twice daily  As needed  Cough/congestion .  Notify coumadin clinic that you are on new antibiotic.  Follow up Dr. Lamonte Sakai  As planned and As needed   Please contact office for sooner follow up if symptoms do not improve or worsen or seek emergency care

## 2013-08-02 NOTE — Patient Instructions (Signed)
Levaquin 500mg  daily for 7 days .  Mucinex DM Twice daily  As needed  Cough/congestion .  Notify coumadin clinic that you are on new antibiotic.  Follow up Dr. Lamonte Sakai  As planned and As needed   Please contact office for sooner follow up if symptoms do not improve or worsen or seek emergency care

## 2013-08-02 NOTE — Telephone Encounter (Signed)
Pt's wife called.  Pt starting Levaquin x 7 days today.  This will cause increase in INR.  Pt last took Levaquin and INR increased to 4.4.  Have asked pt to decrease dose to 2.5mg  daily for the next 4 days then resume previous dose.  Will check INR on 6/30.

## 2013-08-02 NOTE — Progress Notes (Signed)
Subjective:    Patient ID: John [name], male    DOB: 16-Dec-1933, 79 y.o.   MRN: [id number] HPI 80 yo complicated pt w hx COPD, NCSLCA s/p resection. Hx AVR and Parox A Fib that has been assoc with vol overload and dyspnea. Also w pulm infilgtrates, ? 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 Has been in and out of hosp in Feb 2012 due to exacerbations, RVR, etc. Last d/c was 2/20. Feels that his HR is better controlled on Tikosyn.   Last visit we made plan to wean Pred to off (COP vs Amio tox). He feels that his breathing is stable, has been off the pred x 3 days. He is complaining of severe gluteal pain bilaterally radiating to each thigh, started about 3 weeks ago. Of note he started Pravachol about 3 weeks ago.   ROV 06/10/10 -- COPD, NSCLCA w resection, A Fib, ? 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 He follows up his dyspnea, gluteal myalgias. He has been off the prednisone for over a month, seems to be holding his own without the Pred. The myalgias are better off the pravachol, decided w Dr Burt Knack not to try an alternative.   ROV 07/10/10 -- follows up for COPD, restriction from lung resection. 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 He has been doing better since last visit a month ago. He stopped Symbicort as we had planned, doesn't seem to miss it. He is taking the Spiriva. Rare Albuterol use. May be having some increased cough and allergies.   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 Remains on Spiriva, has been off Symbicort for several months. Had CT scan 8/8 to follow NSCLCA - R effusion is smaller, small basilar nodule that will need follow up. He has been having problems with sneezing and coughing for the last several months. Clear nasal drainage. Has been taken off tykosyn since last visit. Currently in NSR.   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 Last CT chest was 09/18/10 for R basilar nodule. Needs to requalify for o2. Last time we initiated trial off Spiriva. He  doesn't believe he missed it. Never uses or needs the albuterol. He remains active, cleaned the gutters this week w O2 on!.   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 CT scan repeated in 03/2011, stable compared with priors. Now off of BD's and seems to be tolerating well. He continues to do well, has been able to stay active. He often has been able to go without O2 at rest. 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 CT scan repeated in 03/2011, stable compared with priors. Now off of BD's and seems to be tolerating well. He has stable DOE - never thought BD's helped him. Having nasal drainage on  guaifenesin. Stopped fluticasone spray. He is using albuterol prn - rarely uses. Still on lisinopril,  very rare cough.   Acute OV 02/26/12 --  Complains of  dry cough x 1 day,alot at night,wife on Tamiflu. sob-same right now, weakness, no wheezing, no cp or tightness,temp 102 now with xtra strength  Tylenol.  Wife began with URI symptoms , seen by PCP this week , dx w/ flu (no swab)  , rx Tamiflu Yesterday he began with dry cough and fever -tmax 102. Body aches today .  No wheezing or increased  dypsnea.  No hemoptyiss. Appetite is fair. No n/v.  No edema.   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 CT scan repeated in 03/2011, stable compared with priors. Had been on BD's (he never believed he benefited) and tolerating as of last visit 10/13, but since then has had problems >> treated for an AE in January as above by TP. Also followed with Dr Burt Knack >> TTE from 12/13 with estimated peak PAP 81 mmHg (was 63 in 10/2009, normal in 11/09). His CT scan 11/13 without significant ILD, but severe emphysematous changes. CXR today with chronic R vol loss and effusion (post-lobectomy). He feels better now from the acute  exacerbation. He knows that he desaturates w exertion >> to 85% with changing clothes or showering.   ROV 04/29/12 -- COPD, NSCLCA s/p resection, hx COP, multifactorial secondary PAH, hypoxemia. Last time, given his significant emphysema on Ct scan and his FEV1, decide to retry BD's >> started [place] + Breo. He still has SOB with changing clothes, etc. Wearing 2L/min, up to 4L/min w most exertion. He has been having itching all over (no rash)  Since starting the BD's. Hasn't really tested his exertional tolerance.   ROV 06/02/12 -- COPD, NSCLCA s/p resection, hx COP, multifactorial secondary PAH, hypoxemia. Given his significant emphysema on Ct scan and his FEV1, decided to retry BD's in 3/'14 >> started [place]. We stopped the [place] last visit after he developed itching. He stayed on [place], is still unclear whether the [place] helps. He is limited at baseline, cannot exercise. He is having frequent aspiration sx - "gets choked".   ROV 11/29/12 -- COPD, NSCLCA s/p resection, hx COP, multifactorial secondary PAH, hypoxemia. He also has A Fib, has had cardioversions but these didn't last, currently on metoprolol.  His COPD regimen is albuterol prn which he rarely uses. His Breo was stopped as a potential contributor to his tachycardia - he wasn't sure it it was helping him.   ROV 02/16/13 - COPD, NSCLCA s/p resection, hx COP, multifactorial secondary PAH, hypoxemia. Started prednisone taper for dyspnea on 1/2, some cough. Cough is a bit better, but dyspnea may be worse. He notes that his BP and Afib have worsened some since the pred was started. He is planning to evaluated for an ablation. His metoprolol has been increased.   ROV 04/06/13 -- COPD, NSCLCA s/p resection, hx COP, hx A Fib (variably rate controlled) multifactorial secondary PAH, hypoxemia. Cardiazem CD was added in January, lasix increased to 120mg  daily and he feels better.  He uses ProAir prn, his usage has increased especially at night. He  has never been convinced that he has benefited from scheduled BD's and he has been concerned that they have made his rhythm harder to manage. He has seen [facility] regarding a possible ablation vs amiodarone (he has been off this due to concerns for impacting lung disease). He underwent PFT at [facility] in pred for possible cardioversion or ablation.   ROV 06/02/13 -- follow up for multifactorial dyspnea and PAH, hypoxemia. He has obstructive and restrictive disease. He has been under eval for possible ablation vs amiodarone, with the knowledge that his prior hx COP may have been amio toxicity. His most recent FEV1 in 2/'15 at [facility] was 0.98L. He feels better with improved HR. He is exercising more, still has SOB.    CAT Score 04/29/2012 03/18/2012  Total CAT Score 8  11   08/02/2013 Acute OV  Complains of  increased SOB, prod cough with clear mucus, wheezing, tightness x2weeks with some low grade fever at onset. Finished zpak 1 week ago, finished pred taper this AM.   Denies any f/c/s, hemoptysis, nausea ,vomiting, PND, leg swelling. Had low grade fever, initially. None for last few days.  Wife has been sick with bronchitis .  Appetite is okay.    Objective:   Physical Exam  Gen: Pleasant, chronically ill, normal affect, comfortable on O2  ENT: No lesions,  mouth clear,  oropharynx clear, no postnasal drip  Neck: No JVD, no TMG, no carotid bruits  Lungs: No use of accessory muscles, decreased BS in bases , no wheezing or crackles   Cardiovascular:  irreg irreg, audible click.   Musculoskeletal: No deformities, no cyanosis or clubbing  Neuro: alert, non focal  Skin: Warm, no lesions or rashes   CT scan chest 02/25/13 --  COMPARISON: DG CHEST 2 VIEW dated 02/16/2013; CT CHEST W/O CM dated  12/25/2011; CT CHEST W/O CM dated 04/08/2011; CT CHEST W/O CM dated  09/18/2010  FINDINGS:  Mediastinal lymph nodes measure up to 11 mm in the lower right  paratracheal station, as before. Right hilar lymphoid  tissue  measures 12 mm. Comparison to prior examinations is difficult due to  lack of IV contrast. No left hilar or axillary adenopathy.  Atherosclerotic calcification of the arterial vasculature. Pulmonary  arteries are enlarged. Heart is enlarged. No pericardial effusion.  Moderate to severe centrilobular emphysema with bullous changes at  the apex of the right hemi thorax. Postoperative changes of right  lower lobectomy with pleural thickening posteriorly. Lungs are  otherwise clear. No pleural fluid. Airway is otherwise unremarkable.  Incidental imaging of the upper abdomen shows small ascites, new.  Liver margin is slightly irregular. Stones are seen in the  gallbladder. Low-attenuation lesions in the kidneys measure up to  2.9 cm on the left. Statistically, cysts are likely. No worrisome  lytic or sclerotic lesions. Degenerative changes are seen in the  spine. Thoracotomy changes on the right.  IMPRESSION:  1. No acute findings in the chest to explain the patient's given  symptoms. Ascites in the upper abdomen, which can be a cause of  shortness of breath.  2. Postoperative changes of right lower lobectomy with associated  scarring and volume loss. Right hilar lymphoid tissue is difficult  to compare to prior exams given lack of IV contrast.  3. Enlarged pulmonary arteries, indicative of pulmonary arterial  hypertension.  4. Marginal irregularity of the liver is indicative of cirrhosis.      Assessment & Plan:

## 2013-08-02 NOTE — Addendum Note (Signed)
Addended by: Parke Poisson E on: 08/02/2013 10:48 AM   Modules accepted: Orders

## 2013-08-09 ENCOUNTER — Ambulatory Visit (INDEPENDENT_AMBULATORY_CARE_PROVIDER_SITE_OTHER): Payer: Medicare Other | Admitting: *Deleted

## 2013-08-09 DIAGNOSIS — Z5181 Encounter for therapeutic drug level monitoring: Secondary | ICD-10-CM

## 2013-08-09 DIAGNOSIS — Z954 Presence of other heart-valve replacement: Secondary | ICD-10-CM

## 2013-08-09 DIAGNOSIS — I359 Nonrheumatic aortic valve disorder, unspecified: Secondary | ICD-10-CM

## 2013-08-09 DIAGNOSIS — Z7901 Long term (current) use of anticoagulants: Secondary | ICD-10-CM

## 2013-08-09 DIAGNOSIS — I4891 Unspecified atrial fibrillation: Secondary | ICD-10-CM

## 2013-08-09 LAB — POCT INR: INR: 6

## 2013-08-16 ENCOUNTER — Other Ambulatory Visit: Payer: Self-pay | Admitting: *Deleted

## 2013-08-16 MED ORDER — DILTIAZEM HCL ER COATED BEADS 120 MG PO CP24: 120.0000 mg | ORAL_CAPSULE | Freq: Two times a day (BID) | ORAL | Status: DC

## 2013-08-19 ENCOUNTER — Encounter: Payer: Self-pay | Admitting: Cardiovascular Disease

## 2013-08-19 ENCOUNTER — Ambulatory Visit (INDEPENDENT_AMBULATORY_CARE_PROVIDER_SITE_OTHER): Payer: Medicare Other | Admitting: Cardiovascular Disease

## 2013-08-19 ENCOUNTER — Ambulatory Visit (INDEPENDENT_AMBULATORY_CARE_PROVIDER_SITE_OTHER): Payer: Medicare Other | Admitting: *Deleted

## 2013-08-19 VITALS — BP 98/70 | HR 86 | Ht 71.0 in | Wt 172.1 lb

## 2013-08-19 DIAGNOSIS — Z954 Presence of other heart-valve replacement: Secondary | ICD-10-CM

## 2013-08-19 DIAGNOSIS — R0602 Shortness of breath: Secondary | ICD-10-CM

## 2013-08-19 DIAGNOSIS — I4891 Unspecified atrial fibrillation: Secondary | ICD-10-CM

## 2013-08-19 DIAGNOSIS — I2510 Atherosclerotic heart disease of native coronary artery without angina pectoris: Secondary | ICD-10-CM

## 2013-08-19 DIAGNOSIS — Z7901 Long term (current) use of anticoagulants: Secondary | ICD-10-CM

## 2013-08-19 DIAGNOSIS — I359 Nonrheumatic aortic valve disorder, unspecified: Secondary | ICD-10-CM

## 2013-08-19 DIAGNOSIS — Z5181 Encounter for therapeutic drug level monitoring: Secondary | ICD-10-CM

## 2013-08-19 LAB — POCT INR: INR: 3.8

## 2013-08-19 MED ORDER — DILTIAZEM HCL ER COATED BEADS 120 MG PO CP24: 120.0000 mg | ORAL_CAPSULE | Freq: Two times a day (BID) | ORAL | Status: AC

## 2013-08-19 NOTE — Progress Notes (Signed)
Patient ID: John [name], male   DOB: 1933-02-18, 80 y.o.   MRN: [id number]     HPI:  80 year old gentleman presenting for followup evaluation.  The patient has aortic stenosis with remote mechanical [employer] aortic valve replacement. He developed a lung cancer and underwent lobectomy in 2011. He's been limited by chronic dyspnea ever since that time. He does have chronic lung disease with COPD but was not significantly limited before his surgery., Postoperatively he developed atrial fibrillation. He was treated with amiodarone which resulted in further pulmonary toxicity. He was then treated with Tikosyn but this had to be discontinued because of significant QT prolongation even on very low doses. The patient has undergone multiple cardioversions. He has been maintained on chronic anticoagulation with warfarin in the context of his mechanical aortic valve replacement and paroxysmal atrial fibrillation.  The patient continues to have problems with shortness of breath, even with 24-hour home oxygen. He denies chest pain, lightheadedness, or syncope. He has leg swelling which is essentially unchanged. He developed a cough a few weeks ago and has been treated with antibiotics. He closely monitors his home blood pressure and heart rates. He noted that his heart rate is been a little more elevated over the past 2 weeks. Heart rates are ranging in the 80s and sometimes in the 90s. Blood pressure has been in the 42P to 536R systolic. He denies bleeding problems on long-term warfarin and low-dose aspirin. He has no other complaints today.  Outpatient Encounter Prescriptions as of 08/19/2013  Medication Sig  . acetaminophen (TYLENOL) 500 MG tablet Take 1,000 mg by mouth every 6 (six) hours as needed for pain.   Marland Kitchen albuterol (VENTOLIN HFA) 108 (90 BASE) MCG/ACT inhaler Inhale 2 puffs into the lungs every 4 (four) hours as needed.  Marland Kitchen allopurinol (ZYLOPRIM) 100 MG tablet 1 tablet daily.  Marland Kitchen aspirin 81 MG tablet Take  81 mg by mouth daily.    Marland Kitchen diltiazem (CARDIZEM CD) 120 MG 24 hr capsule Take 1 capsule (120 mg total) by mouth 2 (two) times daily.  . famotidine (PEPCID) 20 MG tablet Take 20 mg by mouth daily as needed for heartburn.   . furosemide (LASIX) 40 MG tablet Take 2 tablets (80 mg total) by mouth 2 (two) times daily.  Marland Kitchen guaiFENesin (MUCINEX) 600 MG 12 hr tablet Take 2 tablets (1,200 mg total) by mouth as needed.  Marland Kitchen ipratropium (ATROVENT) 0.03 % nasal spray USE 2 SPRAYS IN EACH NOSTRIL EVERY 12 HOURS.  . metoprolol (LOPRESSOR) 100 MG tablet Take 0.5 tablets (50 mg total) by mouth 2 (two) times daily.  . NON FORMULARY Place 2.5 L into the nose daily.   . potassium chloride SA (KLOR-CON M20) 20 MEQ tablet Take 20 meq twice a day  . warfarin (COUMADIN) 2.5 MG tablet TAKE 1 TABLET AS DIRECTED BY COUMADIN CLINIC.  Marland Kitchen warfarin (COUMADIN) 5 MG tablet TAKE AS DIRECTED PER CLINIC  . [DISCONTINUED] diltiazem (CARDIZEM CD) 120 MG 24 hr capsule Take 1 capsule (120 mg total) by mouth 2 (two) times daily.    Allergies  Allergen Reactions  . Penicillins     REACTION: rash/hives, facial swelling    Past Medical History  Diagnosis Date  . PAF (paroxysmal atrial fibrillation)     a. intolerant to amio (? lung toxicity) and tikosyn (prolonged QT);  b. 07/2012 s/p DCCV;  c. 09/2012 s/p DCCV - Multaq initiated.  . Aortic valve disorders     a. s/p AVR, 2006;  b.  chronic coumadin.  . Other primary cardiomyopathies   . Edema     CHRONIC LOWER EXTREMITY  . Unspecified essential hypertension   . Other and unspecified hyperlipidemia   . Heart valve replaced by other means   . Other malaise and fatigue   . Coronary atherosclerosis of native coronary artery   . Acute myocardial infarction, subendocardial infarction, subsequent episode of care   . Unspecified adverse effect of unspecified drug, medicinal and biological substance   . Umbilical hernia without mention of obstruction or gangrene   . Sebaceous cyst   .  Labyrinthitis, unspecified   . Personal history of other diseases of digestive system   . Special screening for malignant neoplasm of prostate   . BC (bronchogenic carcinoma)     S/P LOBECTOMY 2011  . Shortness of breath   . COPD (chronic obstructive pulmonary disease)   . GERD (gastroesophageal reflux disease)     ROS: Negative except as per HPI  BP 98/70  Pulse 86  Ht 5\' 11"  (1.803 m)  Wt 78.073 kg (172 lb 1.9 oz)  BMI 24.02 kg/m2  PHYSICAL EXAM: Pt is alert and oriented, pleasant, chronically ill-appearing male in NAD HEENT: normal Neck: JVP - mildly elevated, carotids 2+= without bruits Lungs: CTA bilaterally CV: Irregularly irregular with normal mechanical aortic closure sounds Abd: soft, NT, Positive BS, no hepatomegaly Ext: 2+ pretibial edema bilaterally, distal pulses intact and equal Skin: warm/dry no rash  EKG:  Atrial fibrillation 75 beats per minute, nonspecific ST abnormality.  Echo 02/28/2013: Study Conclusions  - Left ventricle: The cavity size was normal. Wall thickness was increased in a pattern of moderate LVH. Systolic function was moderately reduced. The estimated ejection fraction was in the range of 35% to 40%. Diffuse hypokinesis. Moderate hypokinesis. - Aortic valve: A mechanical prosthesis was present. Valve area: 2.22cm^2(VTI). Valve area: 2.54cm^2 (Vmax). - Mitral valve: Mild regurgitation. - Left atrium: The atrium was moderately dilated. - Right ventricle: The cavity size was mildly dilated. Systolic function was moderately reduced. - Right atrium: The atrium was moderately dilated. - Tricuspid valve: Moderate regurgitation. - Pulmonary arteries: Systolic pressure was moderately to severely increased. PA peak pressure: 8mm Hg (S).  CXR: FINDINGS:  Prior CABG. Heart size is mildly enlarged. Negative for heart  failure.  COPD with hyperinflation of the lungs. Chronic pleural and  parenchymal scarring in the right lung base. Small left  effusion or  pleural scarring also noted. Negative for mass or pneumonia. Aortic  valve replacement.  IMPRESSION:  Chronic lung disease and scarring in the right base. No change from  the prior study.  ASSESSMENT AND PLAN: 1. Permanent atrial fibrillation. He has undergone evaluation at [facility] electrophysiology and was considered for atrial fibrillation. Considering his multiple problems, I have been somewhat skeptical that this would give him much clinical benefit. He has not heard back and probably will not pursue any further evaluation at this point. I don't think we can do much to improve his heart rate control since we are limited by low blood pressure. He will continue on his current doses of diltiazem and metoprolol. He remains on chronic anticoagulation and seems to be doing well with this. He is not a candidate for antiarrhythmic drugs because of previous lung toxicity with amiodarone and QT prolongation with tedious and.  2. Chronic mixed systolic and diastolic heart failure. He notes progressive shortness of breath. I'm going to ask him to repeat an echocardiogram to make sure that he doesn't have progressive  LV dysfunction. If this is the case, may need to consider cardiac catheterization to exclude progressive CAD. Will continue his current medical program for now.  3. Pulmonary hypertension, severe. I think this is driven by chronic hypoxemia. He understands the need to continue on 24/7 home oxygen therapy. Will refer him to pulmonary rehabilitation.  Sherren Mocha 08/19/2013 11:32 AM

## 2013-08-19 NOTE — Patient Instructions (Addendum)
Your physician recommends that you continue on your current medications as directed. Please refer to the Current Medication list given to you today.  Your physician has requested that you have an echocardiogram. Echocardiography is a painless test that uses sound waves to create images of your heart. It provides your doctor with information about the size and shape of your heart and how well your heart's chambers and valves are working. This procedure takes approximately one hour. There are no restrictions for this procedure.  Your physician wants you to follow-up in: 3 month ov You will receive a reminder letter in the mail two months in advance. If you don't receive a letter, please call our office to schedule the follow-up appointment.    WE WILL ARRANGE PULMONARY REHAB APPT AND CALL YOU TO NOTIFY YOU

## 2013-08-25 ENCOUNTER — Other Ambulatory Visit: Payer: Self-pay | Admitting: Emergency Medicine

## 2013-08-31 ENCOUNTER — Ambulatory Visit (HOSPITAL_COMMUNITY): Payer: Medicare Other | Attending: Cardiovascular Disease | Admitting: Radiology

## 2013-08-31 ENCOUNTER — Ambulatory Visit (INDEPENDENT_AMBULATORY_CARE_PROVIDER_SITE_OTHER): Payer: Medicare Other | Admitting: Pharmacist

## 2013-08-31 DIAGNOSIS — I059 Rheumatic mitral valve disease, unspecified: Secondary | ICD-10-CM | POA: Diagnosis not present

## 2013-08-31 DIAGNOSIS — I2789 Other specified pulmonary heart diseases: Secondary | ICD-10-CM | POA: Insufficient documentation

## 2013-08-31 DIAGNOSIS — R609 Edema, unspecified: Secondary | ICD-10-CM | POA: Diagnosis not present

## 2013-08-31 DIAGNOSIS — I517 Cardiomegaly: Secondary | ICD-10-CM | POA: Diagnosis not present

## 2013-08-31 DIAGNOSIS — J984 Other disorders of lung: Secondary | ICD-10-CM | POA: Diagnosis not present

## 2013-08-31 DIAGNOSIS — Z5181 Encounter for therapeutic drug level monitoring: Secondary | ICD-10-CM

## 2013-08-31 DIAGNOSIS — C3490 Malignant neoplasm of unspecified part of unspecified bronchus or lung: Secondary | ICD-10-CM | POA: Insufficient documentation

## 2013-08-31 DIAGNOSIS — Z7901 Long term (current) use of anticoagulants: Secondary | ICD-10-CM

## 2013-08-31 DIAGNOSIS — I079 Rheumatic tricuspid valve disease, unspecified: Secondary | ICD-10-CM | POA: Diagnosis not present

## 2013-08-31 DIAGNOSIS — Z954 Presence of other heart-valve replacement: Secondary | ICD-10-CM | POA: Insufficient documentation

## 2013-08-31 DIAGNOSIS — R0602 Shortness of breath: Secondary | ICD-10-CM | POA: Insufficient documentation

## 2013-08-31 DIAGNOSIS — I4891 Unspecified atrial fibrillation: Secondary | ICD-10-CM | POA: Diagnosis not present

## 2013-08-31 DIAGNOSIS — J4489 Other specified chronic obstructive pulmonary disease: Secondary | ICD-10-CM | POA: Insufficient documentation

## 2013-08-31 DIAGNOSIS — I359 Nonrheumatic aortic valve disorder, unspecified: Secondary | ICD-10-CM

## 2013-08-31 DIAGNOSIS — J449 Chronic obstructive pulmonary disease, unspecified: Secondary | ICD-10-CM | POA: Insufficient documentation

## 2013-08-31 LAB — POCT INR: INR: 3.5

## 2013-08-31 NOTE — Progress Notes (Signed)
Echocardiogram performed.  

## 2013-09-10 ENCOUNTER — Other Ambulatory Visit: Payer: Self-pay | Admitting: Internal Medicine

## 2013-09-12 ENCOUNTER — Other Ambulatory Visit: Payer: Self-pay | Admitting: Internal Medicine

## 2013-09-21 ENCOUNTER — Ambulatory Visit (INDEPENDENT_AMBULATORY_CARE_PROVIDER_SITE_OTHER): Payer: Medicare Other | Admitting: Pharmacist

## 2013-09-21 DIAGNOSIS — I359 Nonrheumatic aortic valve disorder, unspecified: Secondary | ICD-10-CM

## 2013-09-21 DIAGNOSIS — Z7901 Long term (current) use of anticoagulants: Secondary | ICD-10-CM

## 2013-09-21 DIAGNOSIS — I4891 Unspecified atrial fibrillation: Secondary | ICD-10-CM

## 2013-09-21 DIAGNOSIS — Z954 Presence of other heart-valve replacement: Secondary | ICD-10-CM

## 2013-09-21 LAB — POCT INR: INR: 3.9

## 2013-09-23 ENCOUNTER — Other Ambulatory Visit: Payer: Self-pay | Admitting: Cardiovascular Disease

## 2013-10-05 ENCOUNTER — Ambulatory Visit (INDEPENDENT_AMBULATORY_CARE_PROVIDER_SITE_OTHER): Payer: Medicare Other | Admitting: *Deleted

## 2013-10-05 DIAGNOSIS — Z7901 Long term (current) use of anticoagulants: Secondary | ICD-10-CM

## 2013-10-05 DIAGNOSIS — I359 Nonrheumatic aortic valve disorder, unspecified: Secondary | ICD-10-CM

## 2013-10-05 DIAGNOSIS — I4891 Unspecified atrial fibrillation: Secondary | ICD-10-CM

## 2013-10-05 DIAGNOSIS — Z5181 Encounter for therapeutic drug level monitoring: Secondary | ICD-10-CM

## 2013-10-05 DIAGNOSIS — Z954 Presence of other heart-valve replacement: Secondary | ICD-10-CM

## 2013-10-05 LAB — POCT INR: INR: 2.9

## 2013-10-06 ENCOUNTER — Encounter: Payer: Self-pay | Admitting: Internal Medicine

## 2013-10-06 ENCOUNTER — Ambulatory Visit (INDEPENDENT_AMBULATORY_CARE_PROVIDER_SITE_OTHER): Payer: Medicare Other | Admitting: Internal Medicine

## 2013-10-06 ENCOUNTER — Other Ambulatory Visit (INDEPENDENT_AMBULATORY_CARE_PROVIDER_SITE_OTHER): Payer: Medicare Other

## 2013-10-06 VITALS — BP 102/60 | HR 75 | Temp 98.0°F | Wt 172.5 lb

## 2013-10-06 DIAGNOSIS — R609 Edema, unspecified: Secondary | ICD-10-CM

## 2013-10-06 DIAGNOSIS — I2510 Atherosclerotic heart disease of native coronary artery without angina pectoris: Secondary | ICD-10-CM

## 2013-10-06 DIAGNOSIS — L259 Unspecified contact dermatitis, unspecified cause: Secondary | ICD-10-CM

## 2013-10-06 LAB — HEPATIC FUNCTION PANEL
ALBUMIN: 3.9 g/dL (ref 3.5–5.2)
ALT: 14 U/L (ref 0–53)
AST: 29 U/L (ref 0–37)
Alkaline Phosphatase: 85 U/L (ref 39–117)
BILIRUBIN TOTAL: 1.5 mg/dL — AB (ref 0.2–1.2)
Bilirubin, Direct: 0.3 mg/dL (ref 0.0–0.3)
Total Protein: 7.6 g/dL (ref 6.0–8.3)

## 2013-10-06 LAB — BASIC METABOLIC PANEL
BUN: 12 mg/dL (ref 6–23)
CHLORIDE: 101 meq/L (ref 96–112)
CO2: 30 mEq/L (ref 19–32)
CREATININE: 1 mg/dL (ref 0.4–1.5)
Calcium: 9.4 mg/dL (ref 8.4–10.5)
GFR: 74.17 mL/min (ref >60.00)
Glucose, Bld: 91 mg/dL (ref 70–99)
POTASSIUM: 4 meq/L (ref 3.5–5.1)
Sodium: 139 mEq/L (ref 135–145)

## 2013-10-06 NOTE — Patient Instructions (Signed)
Dip face cloth in  sterile saline and applied to the rash twice a day. The saline can be purchased at the drugstore or you can make your own .Boil cup of salt in a gallon of water. Store mixture  in a clean container.Report Warning  signs as discussed (red streaks, pus, fever, increasing pain).

## 2013-10-06 NOTE — Progress Notes (Signed)
   Subjective:    Patient ID: John [name], male    DOB: 1933/02/15, 80 y.o.   MRN: [id number]  HPI Patient presents today with concerns about increased BLE swelling with associated redness.  He reports getting an abrasion on LLE 1 month ago after tripping on a root. Denies any pain, fever, chills, sweats, warmth associated with swelling.  Elevation improves swelling, Eucerin lotion "seems to aggravate redness."  Similar instance of edema and redness 3-4 years ago.   History of venous insufficiency with surgery in 2009.  He wears compression stockings that are washed with All free and clear and clorox.  Review of Systems     Objective:   Physical Exam BP 102/60  Pulse 75  Temp(Src) 98 F (36.7 C) (Oral)  Wt 172 lb 8 oz (78.245 kg)  SpO2 91%  General Appearance:  Alert, cooperative, minimal distress, appears stated age  Head:  Normocephalic, without obvious abnormality, atraumatic  Nose: Nares normal, septum midline, mucosa normal, no drainage or sinus tenderness  Throat: Lips, mucosa, normal; teeth and gums normal; tongue glossy red  Neck: Supple, symmetrical, no carotid bruit or JVD  Lungs:   Clear to auscultation bilaterally, respirations shallow and labored.  Chest Wall:  No tenderness or deformity  Heart:  Regular rate and rhythm, S1, S2 normal, no murmur, rub or gallop, mechanical valve click.  Abdomen:   Soft, non-tender, bowel sounds active all four quadrants,  no masses, no organomegaly  Extremities: Upper extremities with ecchymoses, no cyanosis or edema.  Bilateral lower extremities: rash with irregular border, +4 pitting edema below knee.   Pulses: Radial pulses 2+, Pedal pulses 1+ and symmetric  Skin: Skin color, texture, turgor normal, no rashes or lesions      Assessment & Plan:

## 2013-10-06 NOTE — Progress Notes (Signed)
   Subjective:    Patient ID: John [name], male    DOB: 07-08-1933, 80 y.o.   MRN: [id number]  HPI   He has had increased bilateral lower extremity edema associated with some erythema. There is no associated pain, fever, chills, sweats, or increased warmth associated with the swelling. Elevation does improve the swelling. He felt that Eucerin applied topically exacerbated the redness.  He did sustain an abrasion on the left lower extremity one month ago after tripping over a root ; but he had no significant sequelae  He did have a similar instance of edema and redness 3-4 years ago.  He has a history of venous insufficiency for which he had surgery 2009. He wears compression stockings which are washed with a hypoallergenic detergent, All. Also Chlorox is used with the detergent.  He is not on amlodipine.  The swelling is in spite of taking a total of 160 mg of furosemide daily. He does take a tablespoon and a half of vinegar each night.  The chart was reviewed. His albumin was 4.3 on 01/14/12. His last creatinine was 1.0 in March of this year. In December 2013 his AST and ALT were well within normal limits.    Review of Systems  He denies fever, chills, sweats associated with the rash     Objective:   Physical Exam  Pertinent positive findings include: He has an upper plate. He wears no mandibular plate. The second heart sound is increased Breath sounds are decreased throughout the lung fields. He does have minor rales at the bases. He does wear nasal oxygen. He has pitting edema to knees bilaterally. There is asymmetry to edema with the left greater than the right. There is an irregular very faint lacy-type erythematous rash over the shins. This is cool to touch Toenails are thickened and deformed. Pedal pulses are absent.  General appearance :adequately nourished; in no distress. Eyes: No conjunctival inflammation or scleral icterus is present. Oral exam: Dental hygiene is  good. Lips and gums are healthy appearing.There is no oropharyngeal erythema or exudate noted.  Heart:  Normal rate and regular rhythm. S1 normal without gallop, murmur, click, rub or other extra sounds  Abdomen: bowel sounds normal, soft and non-tender without masses, organomegaly or hernias noted.  No guarding or rebound.  Skin:Warm & dry.  Intact without suspicious lesions ; no jaundice or tenting Lymphatic: No lymphadenopathy is noted about the head, neck, axilla          Assessment & Plan:  #1 asymmetric profound edema despite high-dose furosemide. The appearance of edema suggests that this may be related to low protein/albumin stores.  Additionally a hepatorenal function will be checked.  #2 rash, possible contact dermatitis.  #3 increased sodium intake in the form of vinegar  Plan: See orders and recommendations.

## 2013-10-06 NOTE — Progress Notes (Signed)
Pre visit review using our clinic review tool, if applicable. No additional management support is needed unless otherwise documented below in the visit note. 

## 2013-10-14 ENCOUNTER — Telehealth: Payer: Self-pay | Admitting: Cardiovascular Disease

## 2013-10-14 NOTE — Telephone Encounter (Signed)
Pt ok to proceed with surgery. He would be at high-risk of any major operation or general anesthesia because of his cardiac and pulmonary disease, but should be at low-risk of eye surgery.

## 2013-10-14 NOTE — Telephone Encounter (Signed)
This phone note was faxed to [phone].

## 2013-10-14 NOTE — Telephone Encounter (Signed)
I spoke with Anderson Malta at Dr Dahlia Bailiff office and the pt is scheduled for eye surgery on 10/19/13.  The procedure will be performed at the [facility] and will be under monitored anesthesia care.  The pt has a vitreous hemorrhage and needs to have blood removed. At this time Dr Zadie Rhine did not recommend that the pt hold Coumadin.  They have set the pt up to get his INR checked on 9/8 in preparation for surgery.  Clearance can be faxed to [phone].

## 2013-10-14 NOTE — Telephone Encounter (Signed)
New problem   Need clearance for pt to have retina surgery. Please call

## 2013-10-18 ENCOUNTER — Ambulatory Visit (INDEPENDENT_AMBULATORY_CARE_PROVIDER_SITE_OTHER): Payer: Medicare Other | Admitting: Pharmacist

## 2013-10-18 DIAGNOSIS — I359 Nonrheumatic aortic valve disorder, unspecified: Secondary | ICD-10-CM

## 2013-10-18 DIAGNOSIS — Z954 Presence of other heart-valve replacement: Secondary | ICD-10-CM

## 2013-10-18 DIAGNOSIS — I4891 Unspecified atrial fibrillation: Secondary | ICD-10-CM

## 2013-10-18 DIAGNOSIS — Z7901 Long term (current) use of anticoagulants: Secondary | ICD-10-CM

## 2013-10-18 DIAGNOSIS — Z5181 Encounter for therapeutic drug level monitoring: Secondary | ICD-10-CM

## 2013-10-18 LAB — POCT INR: INR: 3.2

## 2013-10-24 ENCOUNTER — Other Ambulatory Visit: Payer: Self-pay | Admitting: Internal Medicine

## 2013-10-24 ENCOUNTER — Other Ambulatory Visit: Payer: Self-pay | Admitting: Nurse Practitioner

## 2013-10-25 ENCOUNTER — Other Ambulatory Visit: Payer: Self-pay | Admitting: Nurse Practitioner

## 2013-10-25 MED ORDER — ALLOPURINOL 100 MG PO TABS: ORAL_TABLET | ORAL | Status: DC

## 2013-10-25 NOTE — Telephone Encounter (Signed)
This prescription will be refilled one time by  me; it will be necessary for him to establish with a primary care physician for additional refills.

## 2013-10-25 NOTE — Telephone Encounter (Signed)
Looks like he has seen Dr. Linna Darner most recently. Will send to him.

## 2013-10-25 NOTE — Addendum Note (Signed)
Addended by: Roma Schanz R on: 10/25/2013 12:59 PM   Modules accepted: Orders

## 2013-10-25 NOTE — Telephone Encounter (Signed)
This was last filled 09/12/13 with your name--but i do not see where this is your pt--please advise

## 2013-10-28 ENCOUNTER — Telehealth (HOSPITAL_COMMUNITY): Payer: Self-pay

## 2013-10-28 NOTE — Telephone Encounter (Signed)
I have called and left a message with John [name] to inquire about participation in Pulmonary Rehab. Will send letter in mail and follow up.

## 2013-10-31 ENCOUNTER — Telehealth (HOSPITAL_COMMUNITY): Payer: Self-pay

## 2013-10-31 NOTE — Telephone Encounter (Signed)
Spoke with patient's wife regarding entrance to Pulmonary Rehab.  Patient's wife is interested but wants to talk it over with her husband.  Patient will follow up with [place] next week.

## 2013-11-07 ENCOUNTER — Telehealth (HOSPITAL_COMMUNITY): Payer: Self-pay

## 2013-11-07 NOTE — Telephone Encounter (Signed)
I have called and left a message with [place] to inquire about participation in Pulmonary Rehab. Requested that patient return call to follow up.

## 2013-11-08 ENCOUNTER — Ambulatory Visit (INDEPENDENT_AMBULATORY_CARE_PROVIDER_SITE_OTHER): Payer: Medicare Other | Admitting: Pharmacist Clinician (PhC)/ Clinical Pharmacy Specialist

## 2013-11-08 DIAGNOSIS — I4891 Unspecified atrial fibrillation: Secondary | ICD-10-CM

## 2013-11-08 DIAGNOSIS — Z5181 Encounter for therapeutic drug level monitoring: Secondary | ICD-10-CM

## 2013-11-08 DIAGNOSIS — Z7901 Long term (current) use of anticoagulants: Secondary | ICD-10-CM

## 2013-11-08 DIAGNOSIS — Z954 Presence of other heart-valve replacement: Secondary | ICD-10-CM

## 2013-11-08 DIAGNOSIS — I359 Nonrheumatic aortic valve disorder, unspecified: Secondary | ICD-10-CM

## 2013-11-08 LAB — POCT INR: INR: 2.5

## 2013-11-11 ENCOUNTER — Encounter: Payer: Self-pay | Admitting: Nurse Practitioner

## 2013-11-11 ENCOUNTER — Ambulatory Visit (INDEPENDENT_AMBULATORY_CARE_PROVIDER_SITE_OTHER): Payer: Medicare Other | Admitting: Nurse Practitioner

## 2013-11-11 VITALS — BP 124/62 | HR 76 | Ht 71.0 in | Wt 176.0 lb

## 2013-11-11 DIAGNOSIS — I270 Primary pulmonary hypertension: Secondary | ICD-10-CM

## 2013-11-11 DIAGNOSIS — R609 Edema, unspecified: Secondary | ICD-10-CM

## 2013-11-11 DIAGNOSIS — R0600 Dyspnea, unspecified: Secondary | ICD-10-CM

## 2013-11-11 DIAGNOSIS — I482 Chronic atrial fibrillation, unspecified: Secondary | ICD-10-CM

## 2013-11-11 DIAGNOSIS — Z5181 Encounter for therapeutic drug level monitoring: Secondary | ICD-10-CM

## 2013-11-11 DIAGNOSIS — J438 Other emphysema: Secondary | ICD-10-CM

## 2013-11-11 DIAGNOSIS — I272 Pulmonary hypertension, unspecified: Secondary | ICD-10-CM

## 2013-11-11 DIAGNOSIS — I2510 Atherosclerotic heart disease of native coronary artery without angina pectoris: Secondary | ICD-10-CM

## 2013-11-11 LAB — HEPATIC FUNCTION PANEL
ALT: 14 U/L (ref 0–53)
AST: 33 U/L (ref 0–37)
Albumin: 4.1 g/dL (ref 3.5–5.2)
Alkaline Phosphatase: 91 U/L (ref 39–117)
Bilirubin, Direct: 0.3 mg/dL (ref 0.0–0.3)
Total Bilirubin: 1.5 mg/dL — ABNORMAL HIGH (ref 0.2–1.2)
Total Protein: 7.8 g/dL (ref 6.0–8.3)

## 2013-11-11 LAB — COMPREHENSIVE METABOLIC PANEL
ALT: 14 U/L (ref 0–53)
AST: 33 U/L (ref 0–37)
Albumin: 4.1 g/dL (ref 3.5–5.2)
Alkaline Phosphatase: 91 U/L (ref 39–117)
BUN: 16 mg/dL (ref 6–23)
CO2: 21 mEq/L (ref 19–32)
Calcium: 9.2 mg/dL (ref 8.4–10.5)
Chloride: 102 mEq/L (ref 96–112)
Creatinine, Ser: 1.2 mg/dL (ref 0.4–1.5)
GFR: 64.01 mL/min (ref >60.00)
Glucose, Bld: 128 mg/dL — ABNORMAL HIGH (ref 70–99)
Potassium: 4.2 mEq/L (ref 3.5–5.1)
Sodium: 138 mEq/L (ref 135–145)
Total Bilirubin: 1.5 mg/dL — ABNORMAL HIGH (ref 0.2–1.2)
Total Protein: 7.8 g/dL (ref 6.0–8.3)

## 2013-11-11 LAB — CBC
HCT: 40.3 % (ref 39.0–52.0)
Hemoglobin: 12.9 g/dL — ABNORMAL LOW (ref 13.0–17.0)
MCHC: 32 g/dL (ref 30.0–36.0)
MCV: 94.9 fl (ref 78.0–100.0)
Platelets: 238 10*3/uL (ref 150.0–400.0)
RBC: 4.24 Mil/uL (ref 4.22–5.81)
RDW: 18.1 % — ABNORMAL HIGH (ref 11.5–15.5)
WBC: 8.1 10*3/uL (ref 4.0–10.5)

## 2013-11-11 LAB — TSH: TSH: 1.8 u[IU]/mL (ref 0.35–4.50)

## 2013-11-11 MED ORDER — SPIRONOLACTONE 25 MG PO TABS: 12.5000 mg | ORAL_TABLET | Freq: Every day | ORAL | Status: DC

## 2013-11-11 NOTE — Progress Notes (Signed)
John [name] Date of Birth: 1933/10/06 Medical Record #[id number]  History of Present Illness: John [name] is seen back today for a work in visit. Seen for Dr. Burt Knack. He is a 80 year old male with multiple issues which include aortic stenosis with remote mechanical [employer] aortic valve replacement. He developed a lung cancer and underwent lobectomy in 2011. He's been limited by chronic dyspnea ever since that time. He does have chronic lung disease with COPD but was not significantly limited before his surgery. Postoperatively he developed atrial fibrillation. He was treated with amiodarone which resulted in further pulmonary toxicity. He was then treated with Tikosyn but this had to be discontinued because of significant QT prolongation even on very low doses. He could not maintain sinus rhythm with Multaq. The patient has undergone multiple cardioversions and is now managed in chronic AF. He has been maintained on chronic anticoagulation with warfarin in the context of his mechanical aortic valve replacement and paroxysmal atrial fibrillation.   He was last seen by me in March of 2015 - was not much for me to offer. He was not felt to really be grasping the seriousness of his issues and the limited treatment.   Saw Dr. Burt Knack in July - echo updated - EF had improved some - continued with medical therapy. Referred back to pulmonary rehab.   Comes in today. Here with his wife. He says they "did not know where to turn". He has more swelling in his legs. He is restricting his salt as best he can. He uses support stockings. The swelling is not really going down overnight. Feels more bloated. Desats a little quicker as well by his report. HR seems to be ok on low dose CCB. Trying to get in with pulmonary rehab. Tells me his diet is poor. Very limited activity. He has no fever. Thinks he needs antibiotics. Also complaining of "jock itch".   Current Outpatient Prescriptions  Medication Sig Dispense  Refill  . acetaminophen (TYLENOL) 500 MG tablet Take 1,000 mg by mouth every 6 (six) hours as needed for pain.       Marland Kitchen allopurinol (ZYLOPRIM) 100 MG tablet TAKE 1 TABLET ONCE DAILY.  30 tablet  0  . aspirin 81 MG tablet Take 81 mg by mouth daily.        Marland Kitchen diltiazem (CARDIZEM CD) 120 MG 24 hr capsule Take 1 capsule (120 mg total) by mouth 2 (two) times daily.  180 capsule  3  . famotidine (PEPCID) 20 MG tablet Take 20 mg by mouth daily as needed for heartburn.       . furosemide (LASIX) 40 MG tablet Take 2 tablets (80 mg total) by mouth 2 (two) times daily.  120 tablet  6  . guaiFENesin (MUCINEX) 600 MG 12 hr tablet Take 2 tablets (1,200 mg total) by mouth as needed.  120 suppository  11  . ipratropium (ATROVENT) 0.03 % nasal spray USE 2 SPRAYS IN EACH NOSTRIL EVERY 12 HOURS.  30 mL  5  . metoprolol (LOPRESSOR) 100 MG tablet Take 0.5 tablets (50 mg total) by mouth 2 (two) times daily.  180 tablet  3  . NON FORMULARY Place 2.5 L into the nose daily.       . potassium chloride SA (K-DUR,KLOR-CON) 20 MEQ tablet TAKE 1 TABLET TWICE DAILY.  60 tablet  3  . PROAIR HFA 108 (90 BASE) MCG/ACT inhaler INHALE 2 PUFFS EVERY 4 HOURS AS NEEDED  8.5 g  6  .  spironolactone (ALDACTONE) 25 MG tablet Take 0.5 tablets (12.5 mg total) by mouth daily.  30 tablet  3  . warfarin (COUMADIN) 2.5 MG tablet TAKE 1 TABLET AS DIRECTED BY COUMADIN CLINIC.  30 tablet  3  . warfarin (COUMADIN) 5 MG tablet TAKE AS DIRECTED PER CLINIC  60 tablet  1   No current facility-administered medications for this visit.    Allergies  Allergen Reactions  . Penicillins     REACTION: rash/hives, facial swelling    Past Medical History  Diagnosis Date  . PAF (paroxysmal atrial fibrillation)     a. intolerant to amio (? lung toxicity) and tikosyn (prolonged QT);  b. 07/2012 s/p DCCV;  c. 09/2012 s/p DCCV - Multaq initiated.  . Aortic valve disorders     a. s/p AVR, 2006;  b. chronic coumadin.  . Other primary cardiomyopathies   . Edema      CHRONIC LOWER EXTREMITY  . Unspecified essential hypertension   . Other and unspecified hyperlipidemia   . Heart valve replaced by other means   . Other malaise and fatigue   . Coronary atherosclerosis of native coronary artery   . Acute myocardial infarction, subendocardial infarction, subsequent episode of care   . Unspecified adverse effect of unspecified drug, medicinal and biological substance   . Umbilical hernia without mention of obstruction or gangrene   . Sebaceous cyst   . Labyrinthitis, unspecified   . Personal history of other diseases of digestive system   . Special screening for malignant neoplasm of prostate   . BC (bronchogenic carcinoma)     S/P LOBECTOMY 2011  . Shortness of breath   . COPD (chronic obstructive pulmonary disease)   . GERD (gastroesophageal reflux disease)     Past Surgical History  Procedure Laterality Date  . Aortic valve replacement  1994    [facility]  . Appendectomy  [facility]  . Abdominal hernia repair    . Lobectomy  2011  . Cardioversion N/A 08/03/2012    Procedure: CARDIOVERSION;  Surgeon: Sherren Mocha, MD;  Location: [facility];  Service: Cardiovascular;  Laterality: N/A;  . Eye surgery      retinal repair  . Cardioversion N/A 09/20/2012    Procedure: CARDIOVERSION;  Surgeon: Evans Lance, MD;  Location: [facility] OR;  Service: Cardiovascular;  Laterality: N/A;    History  Smoking status  . Former Smoker -- 1.00 packs/day for 40 years  . Types: Cigarettes  . Quit date: 02/10/2009  Smokeless tobacco  . Never Used    History  Alcohol Use  . Yes    Comment: socially "once in a while"    Family History  Problem Relation Age of Onset  . Colon cancer Father   . Aneurysm Mother     RUPTURE-NECK   . Coronary artery disease Mother     Review of Systems: The review of systems is per the HPI.  All other systems were reviewed and are negative.  Physical Exam: BP 124/62  Pulse 76  Ht 5\' 11"  (1.803 m)  Wt 176 lb (79.833 kg)   BMI 24.56 kg/m2 Patient is very pleasant and in no acute distress. He looks chronically ill. Has his oxygen in place. Skin is warm and dry. Color is normal.  HEENT is unremarkable. Normocephalic/atraumatic. PERRL. Sclera are nonicteric. Neck is supple. No masses. No JVD. Lungs are clear. Cardiac exam shows an irregular rhythm. Rate ok. Valve crisp. Abdomen is soft with umbilical hernia. Extremities are with over 2+ pitting  edema. His legs are NOT red or hot. He is wearing shorts today (50 degrees outside). Gait not tested. No gross neurologic deficits noted.  Wt Readings from Last 3 Encounters:  11/11/13 176 lb (79.833 kg)  10/06/13 172 lb 8 oz (78.245 kg)  08/19/13 172 lb 1.9 oz (78.073 kg)    LABORATORY DATA/PROCEDURES:  Lab Results  Component Value Date   WBC 16.6* 02/25/2013   HGB 14.2 02/25/2013   HCT 44.0 02/25/2013   PLT 185.0 02/25/2013   GLUCOSE 91 10/06/2013   CHOL 148 01/14/2012   TRIG 165.0* 01/14/2012   HDL 26.90* 01/14/2012   LDLDIRECT 87.7 12/30/2010   LDLCALC 88 01/14/2012   ALT 14 10/06/2013   AST 29 10/06/2013   NA 139 10/06/2013   K 4.0 10/06/2013   CL 101 10/06/2013   CREATININE 1.0 10/06/2013   BUN 12 10/06/2013   CO2 30 10/06/2013   TSH 0.195* 09/19/2012   PSA 4.95* 03/09/2007   INR 2.5 11/08/2013   Lab Results  Component Value Date   INR 2.5 11/08/2013   INR 3.2 10/18/2013   INR 2.9 10/05/2013   PROTIME 17.5 07/28/2008    BNP (last 3 results)  Recent Labs  02/25/13 1148 05/10/13 1039  PROBNP 469.0* 160.0*   Echo Study Conclusions from July 2015  - Left ventricle: The cavity size was normal. There was mild concentric hypertrophy. Systolic function was mildly to moderately reduced. The estimated ejection fraction was in the range of 40% to 45%. Diffuse hypokinesis. The study is not technically sufficient to allow evaluation of LV diastolic function. - Ventricular septum: Septal motion showed paradox. The contour showed diastolic flattening. These changes are  consistent with RV volume overload. - Aortic valve: A mechanical prosthesis was present and functioning normally. - Left atrium: The atrium was severely dilated. - Right ventricle: The cavity size was mildly dilated. Systolic function was reduced. - Right atrium: The atrium was moderately to severely dilated. - Tricuspid valve: There was moderate-severe regurgitation directed centrally. - Pulmonary arteries: Systolic pressure was severely increased. PA peak pressure: 72 mm Hg (S).   Assessment / Plan: 1. Swelling - most likely multifactorial - venous insufficiency, HF, valvular HF, etc. He does have reduction in his EF. I am hesitant to give him Zaroxolyn. Will try Aldactone 12.5 mg daily with his current dose of Lasix. Update his labs today. He has follow up with Dr. Burt Knack later this month. Check BMET in one week. May be able to stop his potassium. I do not think this is cellulitis - and I do not think he needs antibiotics at this time.   2. LV dysfunction  3. Persistent AF -  His rate is controlled. He is on beta blocker and low dose CCB - I have left him on this regimen.   4. Chronic anticoagulation  5. COPD/chronic dyspnea - still seems to have poor insight into his health.   Patient is agreeable to this plan and will call if any problems develop in the interim.   Burtis Junes, RN, [facility] [address] [place], [place]  [id number] [phone]

## 2013-11-11 NOTE — Patient Instructions (Addendum)
We will be checking the following labs today CMET, CBC, TSH, HPF  Stay on your current medicines but I am adding Aldactone 25 mg to take just a half a pill a day  We will need to repeat a BMET in one week  Weigh every day  Keep restricting your salt  Try to elevate your legs  See Dr. Burt Knack as planned later this month  Call the [place] office at [phone] if you have any questions, problems or concerns.

## 2013-11-14 ENCOUNTER — Other Ambulatory Visit: Payer: Self-pay | Admitting: *Deleted

## 2013-11-14 ENCOUNTER — Telehealth (HOSPITAL_COMMUNITY): Payer: Self-pay

## 2013-11-14 MED ORDER — POTASSIUM CHLORIDE CRYS ER 20 MEQ PO TBCR: 20.0000 meq | EXTENDED_RELEASE_TABLET | Freq: Every day | ORAL | Status: DC

## 2013-11-18 ENCOUNTER — Other Ambulatory Visit (INDEPENDENT_AMBULATORY_CARE_PROVIDER_SITE_OTHER): Payer: Medicare Other | Admitting: *Deleted

## 2013-11-18 DIAGNOSIS — I270 Primary pulmonary hypertension: Secondary | ICD-10-CM

## 2013-11-18 DIAGNOSIS — R609 Edema, unspecified: Secondary | ICD-10-CM

## 2013-11-18 DIAGNOSIS — I482 Chronic atrial fibrillation, unspecified: Secondary | ICD-10-CM

## 2013-11-18 DIAGNOSIS — R0600 Dyspnea, unspecified: Secondary | ICD-10-CM

## 2013-11-18 DIAGNOSIS — J438 Other emphysema: Secondary | ICD-10-CM

## 2013-11-18 DIAGNOSIS — I272 Pulmonary hypertension, unspecified: Secondary | ICD-10-CM

## 2013-11-18 DIAGNOSIS — Z5181 Encounter for therapeutic drug level monitoring: Secondary | ICD-10-CM

## 2013-11-18 LAB — BASIC METABOLIC PANEL
BUN: 17 mg/dL (ref 6–23)
CO2: 27 mEq/L (ref 19–32)
Calcium: 9.1 mg/dL (ref 8.4–10.5)
Chloride: 100 mEq/L (ref 96–112)
Creatinine, Ser: 1.2 mg/dL (ref 0.4–1.5)
GFR: 60.42 mL/min (ref >60.00)
Glucose, Bld: 114 mg/dL — ABNORMAL HIGH (ref 70–99)
Potassium: 3.8 mEq/L (ref 3.5–5.1)
Sodium: 135 mEq/L (ref 135–145)

## 2013-11-21 ENCOUNTER — Ambulatory Visit (INDEPENDENT_AMBULATORY_CARE_PROVIDER_SITE_OTHER): Payer: Medicare Other | Admitting: Adult Health

## 2013-11-21 ENCOUNTER — Encounter: Payer: Self-pay | Admitting: Adult Health

## 2013-11-21 VITALS — BP 120/70 | HR 89 | Temp 97.8°F | Ht 71.0 in | Wt 173.0 lb

## 2013-11-21 DIAGNOSIS — I2510 Atherosclerotic heart disease of native coronary artery without angina pectoris: Secondary | ICD-10-CM

## 2013-11-21 DIAGNOSIS — J441 Chronic obstructive pulmonary disease with (acute) exacerbation: Secondary | ICD-10-CM

## 2013-11-21 MED ORDER — TIOTROPIUM BROMIDE MONOHYDRATE 2.5 MCG/ACT IN AERS: 2.0000 | INHALATION_SPRAY | Freq: Every day | RESPIRATORY_TRACT | Status: DC

## 2013-11-21 MED ORDER — LEVOFLOXACIN 500 MG PO TABS: 500.0000 mg | ORAL_TABLET | Freq: Every day | ORAL | Status: DC

## 2013-11-21 NOTE — Patient Instructions (Addendum)
Levaquin 500mg  daily for 7 days .  Mucinex DM Twice daily  As needed  Cough/congestion .  Trial Spiriva Respimat 2 puffs daily .  Notify coumadin clinic that you are on new antibiotic.  Follow up with pulmonary rehab as planned  Follow up Dr. Lamonte Sakai  2-3 weeks and As needed    Please contact office for sooner follow up if symptoms do not improve or worsen or seek emergency care

## 2013-11-22 NOTE — Progress Notes (Signed)
Subjective:    Patient ID: John [name], male    DOB: 1934/01/27, 80 y.o.   MRN: [id number] HPI 80 yo complicated pt w hx COPD, NCSLCA s/p resection. Hx AVR and Parox A Fib that has been assoc with vol overload and dyspnea. Also w pulm infilgtrates, ? 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 Has been in and out of hosp in Feb 2012 due to exacerbations, RVR, etc. Last d/c was 2/20. Feels that his HR is better controlled on Tikosyn.   Last visit we made plan to wean Pred to off (COP vs Amio tox). He feels that his breathing is stable, has been off the pred x 3 days. He is complaining of severe gluteal pain bilaterally radiating to each thigh, started about 3 weeks ago. Of note he started Pravachol about 3 weeks ago.   ROV 06/10/10 -- COPD, NSCLCA w resection, A Fib, ? 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 He follows up his dyspnea, gluteal myalgias. He has been off the prednisone for over a month, seems to be holding his own without the Pred. The myalgias are better off the pravachol, decided w Dr Burt Knack not to try an alternative.   ROV 07/10/10 -- follows up for COPD, restriction from lung resection. 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 He has been doing better since last visit a month ago. He stopped Symbicort as we had planned, doesn't seem to miss it. He is taking the Spiriva. Rare Albuterol use. May be having some increased cough and allergies.   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 Remains on Spiriva, has been off Symbicort for several months. Had CT scan 8/8 to follow NSCLCA - R effusion is smaller, small basilar nodule that will need follow up. He has been having problems with sneezing and coughing for the last several months. Clear nasal drainage. Has been taken off tykosyn since last visit. Currently in NSR.   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 Last CT chest was 09/18/10 for R basilar nodule. Needs to requalify for o2. Last time we initiated trial off Spiriva. He  doesn't believe he missed it. Never uses or needs the albuterol. He remains active, cleaned the gutters this week w O2 on!.   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 CT scan repeated in 03/2011, stable compared with priors. Now off of BD's and seems to be tolerating well. He continues to do well, has been able to stay active. He often has been able to go without O2 at rest. 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 CT scan repeated in 03/2011, stable compared with priors. Now off of BD's and seems to be tolerating well. He has stable DOE - never thought BD's helped him. Having nasal drainage on  guaifenesin. Stopped fluticasone spray. He is using albuterol prn - rarely uses. Still on lisinopril,  very rare cough.   Acute OV 02/26/12 --  Complains of  dry cough x 1 day,alot at night,wife on Tamiflu. sob-same right now, weakness, no wheezing, no cp or tightness,temp 102 now with xtra strength  Tylenol.  Wife began with URI symptoms , seen by PCP this week , dx w/ flu (no swab)  , rx Tamiflu Yesterday he began with dry cough and fever -tmax 102. Body aches today .  No wheezing or increased  dypsnea.  No hemoptyiss. Appetite is fair. No n/v.  No edema.   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 CT scan repeated in 03/2011, stable compared with priors. Had been on BD's (he never believed he benefited) and tolerating as of last visit 10/13, but since then has had problems >> treated for an AE in January as above by TP. Also followed with Dr Burt Knack >> TTE from 12/13 with estimated peak PAP 81 mmHg (was 63 in 10/2009, normal in 11/09). His CT scan 11/13 without significant ILD, but severe emphysematous changes. CXR today with chronic R vol loss and effusion (post-lobectomy). He feels better now from the acute  exacerbation. He knows that he desaturates w exertion >> to 85% with changing clothes or showering.   ROV 04/29/12 -- COPD, NSCLCA s/p resection, hx COP, multifactorial secondary PAH, hypoxemia. Last time, given his significant emphysema on Ct scan and his FEV1, decide to retry BD's >> started [place] + Breo. He still has SOB with changing clothes, etc. Wearing 2L/min, up to 4L/min w most exertion. He has been having itching all over (no rash)  Since starting the BD's. Hasn't really tested his exertional tolerance.   ROV 06/02/12 -- COPD, NSCLCA s/p resection, hx COP, multifactorial secondary PAH, hypoxemia. Given his significant emphysema on Ct scan and his FEV1, decided to retry BD's in 3/'14 >> started [place]. We stopped the [place] last visit after he developed itching. He stayed on [place], is still unclear whether the [place] helps. He is limited at baseline, cannot exercise. He is having frequent aspiration sx - "gets choked".   ROV 11/29/12 -- COPD, NSCLCA s/p resection, hx COP, multifactorial secondary PAH, hypoxemia. He also has A Fib, has had cardioversions but these didn't last, currently on metoprolol.  His COPD regimen is albuterol prn which he rarely uses. His Breo was stopped as a potential contributor to his tachycardia - he wasn't sure it it was helping him.   ROV 02/16/13 - COPD, NSCLCA s/p resection, hx COP, multifactorial secondary PAH, hypoxemia. Started prednisone taper for dyspnea on 1/2, some cough. Cough is a bit better, but dyspnea may be worse. He notes that his BP and Afib have worsened some since the pred was started. He is planning to evaluated for an ablation. His metoprolol has been increased.   ROV 04/06/13 -- COPD, NSCLCA s/p resection, hx COP, hx A Fib (variably rate controlled) multifactorial secondary PAH, hypoxemia. Cardiazem CD was added in January, lasix increased to 120mg  daily and he feels better.  He uses ProAir prn, his usage has increased especially at night. He  has never been convinced that he has benefited from scheduled BD's and he has been concerned that they have made his rhythm harder to manage. He has seen [facility] regarding a possible ablation vs amiodarone (he has been off this due to concerns for impacting lung disease). He underwent [facility]FT at [facility] in pred for possible cardioversion or ablation.   ROV 06/02/13 -- follow up for multifactorial dyspnea and PAH, hypoxemia. He has obstructive and restrictive disease. He has been under eval for possible ablation vs amiodarone, with the knowledge that his prior hx COP may have been amio toxicity. His most recent FEV1 in 2/'15 at [facility] was 0.98L. He feels better with improved HR. He is exercising more, still has SOB.    CAT Score 04/29/2012 03/18/2012  Total CAT Score 8  11   08/02/13  Acute OV  Complains of  increased SOB, prod cough with clear mucus, wheezing, tightness x2weeks with some low grade fever at onset. Finished zpak 1 week ago, finished pred taper this AM.   Denies any f/c/s, hemoptysis, nausea ,vomiting, PND, leg swelling. Had low grade fever, initially. None for last few days.  Wife has been sick with bronchitis .  Appetite is okay.  >levaquin rx   11/21/13 Acute OV (COPD, NSCLCA s/p resection, hx COP vs Amiodarone tox , hx A Fib (variably rate controlled) multifactorial secondary PAH, hypoxemia  Pt complains of one-week history of cough, congestion with thick mucus. Patient denies any hemoptysis, orthopnea, PND, or increased leg swelling. He denies any recent antibiotic use or travel. Appetite has been fair. Denies any nausea, vomiting, or diarrhea. -Taking Mucinex with some relief. He is currently in the pulmonary rehabilitation program  ROS  Constitutional:   No  weight loss, night sweats,  Fevers, chills,  +fatigue, or  lassitude.  HEENT:   No headaches,  Difficulty swallowing,  Tooth/dental problems, or  Sore throat,                No sneezing, itching, ear ache,  +nasal congestion, post  nasal drip,   CV:  No chest pain,  Orthopnea, PND, swelling in lower extremities, anasarca, dizziness, palpitations, syncope.   GI  No heartburn, indigestion, abdominal pain, nausea, vomiting, diarrhea, change in bowel habits, loss of appetite, bloody stools.   Resp:  No chest wall deformity  Skin: no rash or lesions.  GU: no dysuria, change in color of urine, no urgency or frequency.  No flank pain, no hematuria   MS:  No joint pain or swelling.  No decreased range of motion.  No back pain.  Psych:  No change in mood or affect. No depression or anxiety.  No memory loss.       Objective:   Physical Exam  Gen: Pleasant, chronically ill, normal affect, comfortable on O2  ENT: No lesions,  mouth clear,  oropharynx clear, no postnasal drip  Neck: No JVD, no TMG, no carotid bruits  Lungs: No use of accessory muscles, decreased BS in bases , no wheezing or crackles   Cardiovascular:  irreg irreg, audible click.   Musculoskeletal: No deformities, no cyanosis or clubbing  Neuro: alert, non focal  Skin: Warm, no lesions or rashes   CT scan chest 02/25/13 --  COMPARISON: DG CHEST 2 VIEW dated 02/16/2013; CT CHEST W/O CM dated  12/25/2011; CT CHEST W/O CM dated 04/08/2011; CT CHEST W/O CM dated  09/18/2010  FINDINGS:  Mediastinal lymph nodes measure up to 11 mm in the lower right  paratracheal station, as before. Right hilar lymphoid tissue  measures 12 mm. Comparison to prior examinations is difficult due to  lack of IV contrast. No left hilar or axillary adenopathy.  Atherosclerotic calcification of the arterial vasculature. Pulmonary  arteries are enlarged. Heart is enlarged. No pericardial effusion.  Moderate to severe centrilobular emphysema with bullous changes at  the apex of the right hemi thorax. Postoperative changes of right  lower lobectomy with pleural thickening posteriorly. Lungs are  otherwise clear. No pleural fluid. Airway is otherwise unremarkable.   Incidental imaging of the upper abdomen shows small ascites, new.  Liver margin is slightly irregular. Stones are seen in the  gallbladder. Low-attenuation lesions in the kidneys measure up to  2.9 cm on the left. Statistically, cysts are likely. No worrisome  lytic or  sclerotic lesions. Degenerative changes are seen in the  spine. Thoracotomy changes on the right.  IMPRESSION:  1. No acute findings in the chest to explain the patient's given  symptoms. Ascites in the upper abdomen, which can be a cause of  shortness of breath.  2. Postoperative changes of right lower lobectomy with associated  scarring and volume loss. Right hilar lymphoid tissue is difficult  to compare to prior exams given lack of IV contrast.  3. Enlarged pulmonary arteries, indicative of pulmonary arterial  hypertension.  4. Marginal irregularity of the liver is indicative of cirrhosis.      Assessment & Plan:

## 2013-11-22 NOTE — Assessment & Plan Note (Signed)
COPD Flare   Plan  Levaquin 500mg  daily for 7 days .  Mucinex DM Twice daily  As needed  Cough/congestion .  Trial Spiriva Respimat 2 puffs daily .  Notify coumadin clinic that you are on new antibiotic.  Follow up with pulmonary rehab as planned  Follow up Dr. Lamonte Sakai  2-3 weeks and As needed    Please contact office for sooner follow up if symptoms do not improve or worsen or seek emergency care

## 2013-11-23 ENCOUNTER — Other Ambulatory Visit: Payer: Self-pay | Admitting: *Deleted

## 2013-11-23 MED ORDER — POTASSIUM CHLORIDE CRYS ER 20 MEQ PO TBCR
20.0000 meq | EXTENDED_RELEASE_TABLET | Freq: Two times a day (BID) | ORAL | Status: AC
Start: 2013-11-23 — End: ?

## 2013-11-23 MED ORDER — SPIRONOLACTONE 25 MG PO TABS: 25.0000 mg | ORAL_TABLET | Freq: Every day | ORAL | Status: DC

## 2013-11-24 ENCOUNTER — Ambulatory Visit (INDEPENDENT_AMBULATORY_CARE_PROVIDER_SITE_OTHER): Payer: Medicare Other | Admitting: Pharmacist Clinician (PhC)/ Clinical Pharmacy Specialist

## 2013-11-24 DIAGNOSIS — I359 Nonrheumatic aortic valve disorder, unspecified: Secondary | ICD-10-CM

## 2013-11-24 DIAGNOSIS — Z954 Presence of other heart-valve replacement: Secondary | ICD-10-CM

## 2013-11-24 DIAGNOSIS — Z7901 Long term (current) use of anticoagulants: Secondary | ICD-10-CM

## 2013-11-24 DIAGNOSIS — Z5181 Encounter for therapeutic drug level monitoring: Secondary | ICD-10-CM

## 2013-11-24 DIAGNOSIS — I482 Chronic atrial fibrillation, unspecified: Secondary | ICD-10-CM

## 2013-11-24 DIAGNOSIS — Z952 Presence of prosthetic heart valve: Secondary | ICD-10-CM

## 2013-11-24 DIAGNOSIS — I4891 Unspecified atrial fibrillation: Secondary | ICD-10-CM

## 2013-11-24 LAB — POCT INR: INR: 3.1

## 2013-11-25 ENCOUNTER — Other Ambulatory Visit: Payer: Self-pay | Admitting: Internal Medicine

## 2013-11-25 ENCOUNTER — Other Ambulatory Visit: Payer: Self-pay

## 2013-11-26 ENCOUNTER — Encounter (HOSPITAL_COMMUNITY): Payer: Self-pay | Admitting: Emergency Medicine

## 2013-11-26 ENCOUNTER — Emergency Department (HOSPITAL_COMMUNITY): Payer: Medicare Other

## 2013-11-26 ENCOUNTER — Inpatient Hospital Stay (HOSPITAL_COMMUNITY)
Admission: EM | Admit: 2013-11-26 | Discharge: 2013-12-11 | DRG: 190 | Disposition: E | Payer: Medicare Other | Attending: Cardiology | Admitting: Cardiology

## 2013-11-26 DIAGNOSIS — I5031 Acute diastolic (congestive) heart failure: Secondary | ICD-10-CM

## 2013-11-26 DIAGNOSIS — L0390 Cellulitis, unspecified: Secondary | ICD-10-CM | POA: Diagnosis present

## 2013-11-26 DIAGNOSIS — J189 Pneumonia, unspecified organism: Secondary | ICD-10-CM | POA: Diagnosis present

## 2013-11-26 DIAGNOSIS — R042 Hemoptysis: Secondary | ICD-10-CM

## 2013-11-26 DIAGNOSIS — I959 Hypotension, unspecified: Secondary | ICD-10-CM | POA: Diagnosis not present

## 2013-11-26 DIAGNOSIS — R0603 Acute respiratory distress: Secondary | ICD-10-CM

## 2013-11-26 DIAGNOSIS — I2510 Atherosclerotic heart disease of native coronary artery without angina pectoris: Secondary | ICD-10-CM | POA: Diagnosis present

## 2013-11-26 DIAGNOSIS — R0600 Dyspnea, unspecified: Secondary | ICD-10-CM

## 2013-11-26 DIAGNOSIS — Z9119 Patient's noncompliance with other medical treatment and regimen: Secondary | ICD-10-CM | POA: Diagnosis present

## 2013-11-26 DIAGNOSIS — C3490 Malignant neoplasm of unspecified part of unspecified bronchus or lung: Secondary | ICD-10-CM | POA: Diagnosis present

## 2013-11-26 DIAGNOSIS — R29898 Other symptoms and signs involving the musculoskeletal system: Secondary | ICD-10-CM

## 2013-11-26 DIAGNOSIS — Z515 Encounter for palliative care: Secondary | ICD-10-CM | POA: Diagnosis not present

## 2013-11-26 DIAGNOSIS — I5043 Acute on chronic combined systolic (congestive) and diastolic (congestive) heart failure: Secondary | ICD-10-CM | POA: Diagnosis present

## 2013-11-26 DIAGNOSIS — Z79899 Other long term (current) drug therapy: Secondary | ICD-10-CM

## 2013-11-26 DIAGNOSIS — J9621 Acute and chronic respiratory failure with hypoxia: Secondary | ICD-10-CM

## 2013-11-26 DIAGNOSIS — R410 Disorientation, unspecified: Secondary | ICD-10-CM

## 2013-11-26 DIAGNOSIS — Z87891 Personal history of nicotine dependence: Secondary | ICD-10-CM | POA: Diagnosis not present

## 2013-11-26 DIAGNOSIS — Z23 Encounter for immunization: Secondary | ICD-10-CM

## 2013-11-26 DIAGNOSIS — I10 Essential (primary) hypertension: Secondary | ICD-10-CM | POA: Diagnosis present

## 2013-11-26 DIAGNOSIS — E785 Hyperlipidemia, unspecified: Secondary | ICD-10-CM | POA: Diagnosis present

## 2013-11-26 DIAGNOSIS — F419 Anxiety disorder, unspecified: Secondary | ICD-10-CM | POA: Diagnosis present

## 2013-11-26 DIAGNOSIS — R0902 Hypoxemia: Secondary | ICD-10-CM

## 2013-11-26 DIAGNOSIS — R000 Tachycardia, unspecified: Secondary | ICD-10-CM

## 2013-11-26 DIAGNOSIS — Z7982 Long term (current) use of aspirin: Secondary | ICD-10-CM

## 2013-11-26 DIAGNOSIS — K219 Gastro-esophageal reflux disease without esophagitis: Secondary | ICD-10-CM | POA: Diagnosis present

## 2013-11-26 DIAGNOSIS — I2781 Cor pulmonale (chronic): Secondary | ICD-10-CM | POA: Diagnosis present

## 2013-11-26 DIAGNOSIS — Z952 Presence of prosthetic heart valve: Secondary | ICD-10-CM

## 2013-11-26 DIAGNOSIS — J9601 Acute respiratory failure with hypoxia: Secondary | ICD-10-CM | POA: Diagnosis present

## 2013-11-26 DIAGNOSIS — I480 Paroxysmal atrial fibrillation: Secondary | ICD-10-CM | POA: Diagnosis present

## 2013-11-26 DIAGNOSIS — J441 Chronic obstructive pulmonary disease with (acute) exacerbation: Secondary | ICD-10-CM | POA: Diagnosis present

## 2013-11-26 DIAGNOSIS — J410 Simple chronic bronchitis: Secondary | ICD-10-CM

## 2013-11-26 DIAGNOSIS — J449 Chronic obstructive pulmonary disease, unspecified: Secondary | ICD-10-CM

## 2013-11-26 DIAGNOSIS — Z880 Allergy status to penicillin: Secondary | ICD-10-CM | POA: Diagnosis not present

## 2013-11-26 DIAGNOSIS — E43 Unspecified severe protein-calorie malnutrition: Secondary | ICD-10-CM | POA: Diagnosis present

## 2013-11-26 DIAGNOSIS — I252 Old myocardial infarction: Secondary | ICD-10-CM | POA: Diagnosis not present

## 2013-11-26 DIAGNOSIS — J432 Centrilobular emphysema: Secondary | ICD-10-CM

## 2013-11-26 DIAGNOSIS — Z9289 Personal history of other medical treatment: Secondary | ICD-10-CM

## 2013-11-26 DIAGNOSIS — I5021 Acute systolic (congestive) heart failure: Secondary | ICD-10-CM

## 2013-11-26 DIAGNOSIS — Z66 Do not resuscitate: Secondary | ICD-10-CM | POA: Diagnosis not present

## 2013-11-26 DIAGNOSIS — I110 Hypertensive heart disease with heart failure: Secondary | ICD-10-CM | POA: Diagnosis present

## 2013-11-26 DIAGNOSIS — I482 Chronic atrial fibrillation, unspecified: Secondary | ICD-10-CM

## 2013-11-26 DIAGNOSIS — I429 Cardiomyopathy, unspecified: Secondary | ICD-10-CM | POA: Diagnosis present

## 2013-11-26 DIAGNOSIS — Z7901 Long term (current) use of anticoagulants: Secondary | ICD-10-CM | POA: Diagnosis not present

## 2013-11-26 DIAGNOSIS — I509 Heart failure, unspecified: Secondary | ICD-10-CM

## 2013-11-26 DIAGNOSIS — R627 Adult failure to thrive: Secondary | ICD-10-CM | POA: Diagnosis present

## 2013-11-26 DIAGNOSIS — J438 Other emphysema: Secondary | ICD-10-CM

## 2013-11-26 DIAGNOSIS — I272 Other secondary pulmonary hypertension: Secondary | ICD-10-CM | POA: Diagnosis present

## 2013-11-26 DIAGNOSIS — J9811 Atelectasis: Secondary | ICD-10-CM | POA: Diagnosis present

## 2013-11-26 DIAGNOSIS — Z9981 Dependence on supplemental oxygen: Secondary | ICD-10-CM | POA: Diagnosis not present

## 2013-11-26 DIAGNOSIS — I872 Venous insufficiency (chronic) (peripheral): Secondary | ICD-10-CM | POA: Diagnosis present

## 2013-11-26 DIAGNOSIS — I5023 Acute on chronic systolic (congestive) heart failure: Secondary | ICD-10-CM

## 2013-11-26 DIAGNOSIS — J9611 Chronic respiratory failure with hypoxia: Secondary | ICD-10-CM

## 2013-11-26 DIAGNOSIS — IMO0002 Reserved for concepts with insufficient information to code with codable children: Secondary | ICD-10-CM

## 2013-11-26 DIAGNOSIS — I4891 Unspecified atrial fibrillation: Secondary | ICD-10-CM | POA: Diagnosis present

## 2013-11-26 DIAGNOSIS — I359 Nonrheumatic aortic valve disorder, unspecified: Secondary | ICD-10-CM

## 2013-11-26 DIAGNOSIS — R0602 Shortness of breath: Secondary | ICD-10-CM

## 2013-11-26 LAB — BASIC METABOLIC PANEL
Anion gap: 13 (ref 5–15)
BUN: 21 mg/dL (ref 6–23)
CO2: 25 mEq/L (ref 19–32)
CREATININE: 1.12 mg/dL (ref 0.50–1.35)
Calcium: 9.4 mg/dL (ref 8.4–10.5)
Chloride: 100 mEq/L (ref 96–112)
GFR calc non Af Amer: 61 mL/min — ABNORMAL LOW (ref >90)
GFR, EST AFRICAN AMERICAN: 71 mL/min — AB (ref >90)
Glucose, Bld: 132 mg/dL — ABNORMAL HIGH (ref 70–99)
Potassium: 4.6 mEq/L (ref 3.7–5.3)
Sodium: 138 mEq/L (ref 137–147)

## 2013-11-26 LAB — CBC
HCT: 39.4 % (ref 39.0–52.0)
HEMOGLOBIN: 12.3 g/dL — AB (ref 13.0–17.0)
MCH: 29.4 pg (ref 26.0–34.0)
MCHC: 31.2 g/dL (ref 30.0–36.0)
MCV: 94 fL (ref 78.0–100.0)
PLATELETS: 260 10*3/uL (ref 150–400)
RBC: 4.19 MIL/uL — ABNORMAL LOW (ref 4.22–5.81)
RDW: 16.2 % — AB (ref 11.5–15.5)
WBC: 10.6 10*3/uL — ABNORMAL HIGH (ref 4.0–10.5)

## 2013-11-26 LAB — I-STAT TROPONIN, ED: Troponin i, poc: 0.02 ng/mL (ref 0.00–0.08)

## 2013-11-26 LAB — PROTIME-INR
INR: 2.57 — AB (ref 0.00–1.49)
PROTHROMBIN TIME: 27.8 s — AB (ref 11.6–15.2)

## 2013-11-26 LAB — PRO B NATRIURETIC PEPTIDE: Pro B Natriuretic peptide (BNP): 2357 pg/mL — ABNORMAL HIGH (ref 0–450)

## 2013-11-26 LAB — MRSA PCR SCREENING: MRSA by PCR: NEGATIVE

## 2013-11-26 MED ORDER — SODIUM CHLORIDE 0.9 % IJ SOLN
3.0000 mL | INTRAMUSCULAR | Status: DC | PRN
  Administered 2013-12-09: 3 mL via INTRAVENOUS

## 2013-11-26 MED ORDER — SODIUM CHLORIDE 0.9 % IJ SOLN
3.0000 mL | Freq: Two times a day (BID) | INTRAMUSCULAR | Status: DC
  Administered 2013-11-26 – 2013-11-29 (×7): 3 mL via INTRAVENOUS
  Administered 2013-11-30: 10:00:00 via INTRAVENOUS
  Administered 2013-11-30 – 2013-12-09 (×17): 3 mL via INTRAVENOUS

## 2013-11-26 MED ORDER — ZOLPIDEM TARTRATE 5 MG PO TABS: 5.0000 mg | ORAL_TABLET | Freq: Every evening | ORAL | Status: DC | PRN

## 2013-11-26 MED ORDER — BUDESONIDE-FORMOTEROL FUMARATE 160-4.5 MCG/ACT IN AERO
2.0000 | INHALATION_SPRAY | Freq: Two times a day (BID) | RESPIRATORY_TRACT | Status: DC
  Administered 2013-11-27 – 2013-11-29 (×5): 2 via RESPIRATORY_TRACT
  Filled 2013-11-26: qty 6

## 2013-11-26 MED ORDER — LEVOFLOXACIN 500 MG PO TABS
500.0000 mg | ORAL_TABLET | Freq: Every day | ORAL | Status: AC
  Administered 2013-11-26 – 2013-11-28 (×3): 500 mg via ORAL
  Filled 2013-11-26 (×4): qty 1

## 2013-11-26 MED ORDER — TIOTROPIUM BROMIDE MONOHYDRATE 18 MCG IN CAPS
18.0000 ug | ORAL_CAPSULE | Freq: Every day | RESPIRATORY_TRACT | Status: DC
Start: 2013-11-27 — End: 2013-11-29
  Filled 2013-11-26: qty 5

## 2013-11-26 MED ORDER — METOPROLOL TARTRATE 50 MG PO TABS
50.0000 mg | ORAL_TABLET | Freq: Two times a day (BID) | ORAL | Status: DC
  Administered 2013-11-26 – 2013-12-03 (×15): 50 mg via ORAL
  Filled 2013-11-26: qty 1
  Filled 2013-11-26: qty 2
  Filled 2013-11-26 (×2): qty 1
  Filled 2013-11-26: qty 2
  Filled 2013-11-26 (×2): qty 1
  Filled 2013-11-26 (×2): qty 2
  Filled 2013-11-26: qty 1
  Filled 2013-11-26: qty 2
  Filled 2013-11-26 (×2): qty 1
  Filled 2013-11-26: qty 2
  Filled 2013-11-26 (×9): qty 1

## 2013-11-26 MED ORDER — FUROSEMIDE 10 MG/ML IJ SOLN
40.0000 mg | Freq: Once | INTRAMUSCULAR | Status: AC
  Administered 2013-11-26: 40 mg via INTRAVENOUS
  Filled 2013-11-26: qty 4

## 2013-11-26 MED ORDER — WARFARIN - PHARMACIST DOSING INPATIENT
Freq: Every day | Status: DC
  Administered 2013-11-27 – 2013-11-30 (×4)

## 2013-11-26 MED ORDER — FAMOTIDINE 20 MG PO TABS
20.0000 mg | ORAL_TABLET | Freq: Every day | ORAL | Status: DC | PRN
Start: 2013-11-26 — End: 2013-12-05
  Filled 2013-11-26: qty 1

## 2013-11-26 MED ORDER — INFLUENZA VAC SPLIT QUAD 0.5 ML IM SUSY
0.5000 mL | PREFILLED_SYRINGE | INTRAMUSCULAR | Status: AC
  Administered 2013-11-29: 0.5 mL via INTRAMUSCULAR
  Filled 2013-11-26: qty 0.5

## 2013-11-26 MED ORDER — ALBUTEROL SULFATE (2.5 MG/3ML) 0.083% IN NEBU
2.5000 mg | INHALATION_SOLUTION | RESPIRATORY_TRACT | Status: DC | PRN
  Administered 2013-11-27: 2.5 mg via RESPIRATORY_TRACT
  Filled 2013-11-26: qty 3

## 2013-11-26 MED ORDER — ASPIRIN EC 81 MG PO TBEC
81.0000 mg | DELAYED_RELEASE_TABLET | Freq: Every day | ORAL | Status: DC
  Administered 2013-11-27 – 2013-12-08 (×12): 81 mg via ORAL
  Filled 2013-11-26 (×14): qty 1

## 2013-11-26 MED ORDER — LISINOPRIL 2.5 MG PO TABS
2.5000 mg | ORAL_TABLET | Freq: Every day | ORAL | Status: DC
  Administered 2013-11-26 – 2013-12-04 (×9): 2.5 mg via ORAL
  Filled 2013-11-26 (×10): qty 1

## 2013-11-26 MED ORDER — DILTIAZEM HCL ER COATED BEADS 120 MG PO CP24
120.0000 mg | ORAL_CAPSULE | Freq: Two times a day (BID) | ORAL | Status: DC
  Administered 2013-11-26 – 2013-12-04 (×15): 120 mg via ORAL
  Filled 2013-11-26 (×23): qty 1

## 2013-11-26 MED ORDER — ACETAMINOPHEN 325 MG PO TABS
650.0000 mg | ORAL_TABLET | ORAL | Status: DC | PRN
  Administered 2013-11-29 – 2013-12-06 (×5): 650 mg via ORAL
  Filled 2013-11-26 (×5): qty 2

## 2013-11-26 MED ORDER — WARFARIN SODIUM 2.5 MG PO TABS
2.5000 mg | ORAL_TABLET | Freq: Once | ORAL | Status: AC
  Administered 2013-11-26: 2.5 mg via ORAL
  Filled 2013-11-26: qty 1

## 2013-11-26 MED ORDER — SODIUM CHLORIDE 0.9 % IV SOLN: 250.0000 mL | INTRAVENOUS | Status: DC | PRN

## 2013-11-26 MED ORDER — FUROSEMIDE 10 MG/ML IJ SOLN
80.0000 mg | Freq: Two times a day (BID) | INTRAMUSCULAR | Status: DC
  Administered 2013-11-27 – 2013-11-28 (×4): 80 mg via INTRAVENOUS
  Filled 2013-11-26 (×6): qty 8

## 2013-11-26 MED ORDER — ALBUTEROL SULFATE HFA 108 (90 BASE) MCG/ACT IN AERS: 2.0000 | INHALATION_SPRAY | RESPIRATORY_TRACT | Status: DC | PRN

## 2013-11-26 MED ORDER — ASPIRIN EC 81 MG PO TBEC
81.0000 mg | DELAYED_RELEASE_TABLET | ORAL | Status: DC
  Filled 2013-11-26: qty 1

## 2013-11-26 MED ORDER — ONDANSETRON HCL 4 MG/2ML IJ SOLN: 4.0000 mg | Freq: Four times a day (QID) | INTRAMUSCULAR | Status: DC | PRN

## 2013-11-26 MED ORDER — GUAIFENESIN ER 600 MG PO TB12
600.0000 mg | ORAL_TABLET | Freq: Two times a day (BID) | ORAL | Status: DC
  Administered 2013-11-26 – 2013-12-08 (×25): 600 mg via ORAL
  Filled 2013-11-26 (×29): qty 1

## 2013-11-26 MED ORDER — ALLOPURINOL 100 MG PO TABS
100.0000 mg | ORAL_TABLET | ORAL | Status: DC
  Administered 2013-11-27 – 2013-12-02 (×6): 100 mg via ORAL
  Filled 2013-11-26 (×10): qty 1

## 2013-11-26 MED ORDER — IPRATROPIUM-ALBUTEROL 0.5-2.5 (3) MG/3ML IN SOLN
3.0000 mL | RESPIRATORY_TRACT | Status: DC | PRN
  Administered 2013-11-27: 3 mL via RESPIRATORY_TRACT
  Filled 2013-11-26: qty 3

## 2013-11-26 MED ORDER — ACETAMINOPHEN 500 MG PO TABS
1000.0000 mg | ORAL_TABLET | Freq: Four times a day (QID) | ORAL | Status: DC | PRN
Start: 2013-11-26 — End: 2013-11-27

## 2013-11-26 MED ORDER — LEVOFLOXACIN 500 MG PO TABS: 500.0000 mg | ORAL_TABLET | Freq: Every day | ORAL | Status: DC

## 2013-11-26 NOTE — ED Notes (Signed)
Attempted to ween pt off non-rebreather.  5 L nasal cannula pt had increased work of breathing and SpO2 decrease to 80%.

## 2013-11-26 NOTE — ED Provider Notes (Signed)
CSN: [id number]     Arrival date & time 11/19/2013  1555 History   First MD Initiated Contact with Patient 12/07/2013 1618     Chief Complaint  Patient presents with  . Shortness of Breath     (Consider location/radiation/quality/duration/timing/severity/associated sxs/prior Treatment) Patient is a 80 y.o. male presenting with shortness of breath. The history is provided by the patient and the spouse.  Shortness of Breath   BRAHM BARBEAU is a 80 y.o. male who presents for evaluation of shortness of breath, gradually worse over the last several days, especially when supine. He has a cough, productive of thick sputum, yellow in color. He denies fever. His cardiologist recently tried him on spironolactone, for an episode of worsening trouble breathing. His pulmonologist put him on Levaquin about one week ago for cough. Is taking his usual medications and using his oxygen, without relief. He was transferred here by EMS with oxygen at 100%, with improvement of hypoxia, which was 84%, on his usual nasal cannula oxygen. He denies chest pain, nausea, vomiting, focal weakness, or paresthesia. He feels fatigued, and generally weak. There are no other known modifying factors.   Past Medical History  Diagnosis Date  . PAF (paroxysmal atrial fibrillation)     a. intolerant to amio (? lung toxicity) and tikosyn (prolonged QT);  b. 07/2012 s/p DCCV;  c. 09/2012 s/p DCCV - Multaq initiated.  . Aortic valve disorders     a. s/p AVR, 2006;  b. chronic coumadin.  . Other primary cardiomyopathies   . Edema     CHRONIC LOWER EXTREMITY  . Unspecified essential hypertension   . Other and unspecified hyperlipidemia   . Heart valve replaced by other means   . Other malaise and fatigue   . Coronary atherosclerosis of native coronary artery   . Acute myocardial infarction, subendocardial infarction, subsequent episode of care   . Unspecified adverse effect of unspecified drug, medicinal and biological substance    . Umbilical hernia without mention of obstruction or gangrene   . Sebaceous cyst   . Labyrinthitis, unspecified   . Personal history of other diseases of digestive system   . Special screening for malignant neoplasm of prostate   . BC (bronchogenic carcinoma)     S/P LOBECTOMY 2011  . Shortness of breath   . COPD (chronic obstructive pulmonary disease)   . GERD (gastroesophageal reflux disease)    Past Surgical History  Procedure Laterality Date  . Aortic valve replacement  1994    [facility]  . Appendectomy  [facility]  . Abdominal hernia repair    . Lobectomy  2011  . Cardioversion N/A 08/03/2012    Procedure: CARDIOVERSION;  Surgeon: Sherren Mocha, MD;  Location: [place];  Service: Cardiovascular;  Laterality: N/A;  . Eye surgery      retinal repair  . Cardioversion N/A 09/20/2012    Procedure: CARDIOVERSION;  Surgeon: Evans Lance, MD;  Location: [facility] OR;  Service: Cardiovascular;  Laterality: N/A;   Family History  Problem Relation Age of Onset  . Colon cancer Father   . Aneurysm Mother     RUPTURE-NECK   . Coronary artery disease Mother    History  Substance Use Topics  . Smoking status: Former Smoker -- 1.00 packs/day for 40 years    Types: Cigarettes    Quit date: 02/10/2009  . Smokeless tobacco: Never Used  . Alcohol Use: Yes     Comment: socially "once in a while"    Review of  Systems  Respiratory: Positive for shortness of breath.   All other systems reviewed and are negative.     Allergies  Penicillins  Home Medications   Prior to Admission medications   Medication Sig Start Date End Date Taking? Authorizing Provider  acetaminophen (TYLENOL) 500 MG tablet Take 1,000 mg by mouth every 6 (six) hours as needed for pain.     Historical Provider, MD  allopurinol (ZYLOPRIM) 100 MG tablet TAKE 1 TABLET ONCE DAILY. 11/25/13   Olga Millers, MD  aspirin 81 MG tablet Take 81 mg by mouth daily.      Historical Provider, MD  diltiazem (CARDIZEM CD) 120  MG 24 hr capsule Take 1 capsule (120 mg total) by mouth 2 (two) times daily. 08/19/13   Sherren Mocha, MD  famotidine (PEPCID) 20 MG tablet Take 20 mg by mouth daily as needed for heartburn.     Historical Provider, MD  furosemide (LASIX) 40 MG tablet Take 2 tablets (80 mg total) by mouth 2 (two) times daily. 05/20/13   Sherren Mocha, MD  guaiFENesin (MUCINEX) 600 MG 12 hr tablet Take 2 tablets (1,200 mg total) by mouth as needed. 06/12/11   Collene Gobble, MD  ipratropium (ATROVENT) 0.03 % nasal spray USE 2 SPRAYS IN EACH NOSTRIL EVERY 12 HOURS. 01/05/13   Collene Gobble, MD  levofloxacin (LEVAQUIN) 500 MG tablet Take 1 tablet (500 mg total) by mouth daily. 11/21/13 12/01/13  Tammy S Parrett, NP  metoprolol (LOPRESSOR) 100 MG tablet Take 0.5 tablets (50 mg total) by mouth 2 (two) times daily. 04/12/13   Burtis Junes, NP  NON FORMULARY Place 2.5 L into the nose daily.     Historical Provider, MD  potassium chloride SA (K-DUR,KLOR-CON) 20 MEQ tablet Take 1 tablet (20 mEq total) by mouth 2 (two) times daily. 11/23/13   Burtis Junes, NP  PROAIR HFA 108 (90 BASE) MCG/ACT inhaler INHALE 2 PUFFS EVERY 4 HOURS AS NEEDED 08/25/13   Collene Gobble, MD  Tiotropium Bromide Monohydrate (SPIRIVA RESPIMAT) 2.5 MCG/ACT AERS Inhale 2 puffs into the lungs daily. 11/21/13   Melvenia Needles, NP  warfarin (COUMADIN) 2.5 MG tablet TAKE 1 TABLET AS DIRECTED BY COUMADIN CLINIC. 09/26/13   Sherren Mocha, MD  warfarin (COUMADIN) 5 MG tablet TAKE AS DIRECTED PER CLINIC    Sherren Mocha, MD   BP 116/85  Pulse 85  Temp(Src) 97.9 F (36.6 C) (Oral)  Resp 28  Ht 5\' 11"  (1.803 m)  Wt 174 lb (78.926 kg)  BMI 24.28 kg/m2  SpO2 99% Physical Exam  Nursing note and vitals reviewed. Constitutional: He is oriented to person, place, and time. He appears well-developed and well-nourished. He appears distressed (he is uncomfortable).  HENT:  Head: Normocephalic and atraumatic.  Right Ear: External ear normal.  Left Ear:  External ear normal.  Eyes: Conjunctivae and EOM are normal. Pupils are equal, round, and reactive to light.  Neck: Normal range of motion and phonation normal. Neck supple.  Cardiovascular: Normal rate, regular rhythm and normal heart sounds.   JVD present  Pulmonary/Chest: He is in respiratory distress (tachypnea with decreased air movement, bilateral). He has wheezes (Expiratory). He has no rales. He exhibits no tenderness and no bony tenderness.  Abdominal: Soft. There is no tenderness.  Musculoskeletal: Normal range of motion.  3+ pitting edema of both lower legs, below the knee  Neurological: He is alert and oriented to person, place, and time. No cranial nerve deficit or sensory deficit.  He exhibits normal muscle tone. Coordination normal.  Skin: Skin is warm, dry and intact.  Psychiatric: He has a normal mood and affect. His behavior is normal. Judgment and thought content normal.    ED Course  Procedures (including critical care time) Initial treatment, patient maintained on 100% face mask, with borderline O2 saturation at 91%. EKG shows recurrent atrial fibrillation. Discussed, BiPAP, with patient and wife, he would prefer to avoid it, if possible.    Medications  furosemide (LASIX) injection 40 mg (40 mg Intravenous Given 11/23/2013 1727)    Patient Vitals for the past 24 hrs:  BP Temp Temp src Pulse Resp SpO2 Height Weight  11/25/2013 1730 116/85 mmHg - - 85 28 99 % - -  11/17/2013 1729 - - - 98 40 100 % - -  11/18/2013 1700 118/73 mmHg - - 88 24 100 % - -  11/12/2013 1645 124/78 mmHg - - 172 26 95 % - -  11/22/2013 1630 120/68 mmHg - - 54 42 71 % - -  11/22/2013 1621 - - - 82 23 100 % - -  12/07/2013 1616 - - - - - 82 % - -  11/10/2013 1615 113/73 mmHg - - 75 35 86 % - -  11/24/2013 1613 - - - - - - 5\' 11"  (1.803 m) 174 lb (78.926 kg)  11/18/2013 1609 - - - 84 27 100 % - -  11/20/2013 1606 126/79 mmHg 97.9 F (36.6 C) Oral 90 30 100 % - -  12/05/2013 1555 - - - - - 100 % - -    5:16  PM  Reevaluation with update and discussion. After initial assessment and treatment, an updated evaluation reveals critical status unchanged. Trial of BiPAP, beginning. Lasix ordered for CHF exacerbation. Patient and family updated on findings. Keniyah Gelinas L   17:15- cardiology paged for recommendations and assistance- Dr. Murvin Natal will see pt in ED  CRITICAL CARE Performed by: Daleen Bo L Total critical care time: 50 minutes Critical care time was exclusive of separately billable procedures and treating other patients. Critical care was necessary to treat or prevent imminent or life-threatening deterioration. Critical care was time spent personally by me on the following activities: development of treatment plan with patient and/or surrogate as well as nursing, discussions with consultants, evaluation of patient's response to treatment, examination of patient, obtaining history from patient or surrogate, ordering and performing treatments and interventions, ordering and review of laboratory studies, ordering and review of radiographic studies, pulse oximetry and re-evaluation of patient's condition.   Labs Review Labs Reviewed  CBC - Abnormal; Notable for the following:    WBC 10.6 (*)    RBC 4.19 (*)    Hemoglobin 12.3 (*)    RDW 16.2 (*)    All other components within normal limits  BASIC METABOLIC PANEL - Abnormal; Notable for the following:    Glucose, Bld 132 (*)    GFR calc non Af Amer 61 (*)    GFR calc Af Amer 71 (*)    All other components within normal limits  PRO B NATRIURETIC PEPTIDE - Abnormal; Notable for the following:    Pro B Natriuretic peptide (BNP) 2357.0 (*)    All other components within normal limits  I-STAT TROPOININ, ED    Imaging Review Dg Chest Portable 1 View  11/28/2013   CLINICAL DATA:  Initial evaluation for shortness of breath 1 week, worse today, current history of COPD  EXAM: PORTABLE CHEST - 1 VIEW  COMPARISON:  08/02/2013  FINDINGS:  Moderately severe cardiac enlargement stable from prior study. Mild vascular congestion. Mild diffuse interstitial prominence. There is elevation of the right diaphragm with right base opacity similar to prior study suggesting scarring or atelectasis. There is emphysematous change in the upper lung zones.  IMPRESSION: COPD with chronic scarring in the right base and chronic cardiac enlargement. However, increased interstitial opacities in the mid to lower lung zones currently suggest the possibility of mild cardiogenic interstitial pulmonary edema.   Electronically Signed   By: Skipper Cliche M.D.   On: 11/28/2013 17:08     EKG Interpretation   Date/Time:  Saturday November 26 2013 16:08:15 EDT Ventricular Rate:  86 PR Interval:    QRS Duration: 93 QT Interval:  415 QTC Calculation: 496 R Axis:   46 Text Interpretation:  Atrial fibrillation Ventricular premature complex  Low voltage, extremity leads Abnormal R-wave progression, late transition  Abnormal T, consider ischemia, lateral leads Since last tracing Atrial  fibrillation is now present Confirmed by Dempsy Damiano  MD, Alishah Schulte [phone]) on  11/19/2013 4:35:40 PM      MDM   Final diagnoses:  Acute on chronic systolic congestive heart failure  Hypoxia  Tachycardia  Paroxysmal atrial fibrillation    Respiratory distress, with hypoxia, and Glucovance for congestive heart failure. He's improved on BiPAP. There may be a component of COPD exacerbation as well. He is on Levaquin, at the time of presentation. Doubt ACS, or PE.   Nursing Notes Reviewed/ Care Coordinated, and agree without changes. Applicable Imaging Reviewed.  Interpretation of Laboratory Data incorporated into ED treatment  Plan: Admit    Richarda Blade, MD 11/24/2013 [phone]

## 2013-11-26 NOTE — Progress Notes (Signed)
ANTICOAGULATION CONSULT NOTE - Initial Consult  Pharmacy Consult for Warfarin Indication: atrial fibrillation  Allergies  Allergen Reactions  . Penicillins Hives, Swelling and Rash     facial swelling    Patient Measurements: Height: 5\' 11"  (180.3 cm) Weight: 174 lb (78.926 kg) IBW/kg (Calculated) : 75.3  Vital Signs: Temp: 97.9 F (36.6 C) (10/17 1606) Temp Source: Oral (10/17 1606) BP: 121/73 mmHg (10/17 1900) Pulse Rate: 85 (10/17 1900)  Labs:  Recent Labs  11/24/13 1327 11/23/2013 1622  HGB  --  12.3*  HCT  --  39.4  PLT  --  260  INR 3.1  --   CREATININE  --  1.12    Estimated Creatinine Clearance: 57.9 ml/min (by C-G formula based on Cr of 1.12).   Medical History: Past Medical History  Diagnosis Date  . PAF (paroxysmal atrial fibrillation)     a. intolerant to amio (? lung toxicity) and tikosyn (prolonged QT);  b. 07/2012 s/p DCCV;  c. 09/2012 s/p DCCV - Multaq initiated.  . Aortic valve disorders     a. s/p AVR, 2006;  b. chronic coumadin.  . Other primary cardiomyopathies   . Edema     CHRONIC LOWER EXTREMITY  . Unspecified essential hypertension   . Other and unspecified hyperlipidemia   . Heart valve replaced by other means   . Other malaise and fatigue   . Coronary atherosclerosis of native coronary artery   . Acute myocardial infarction, subendocardial infarction, subsequent episode of care   . Unspecified adverse effect of unspecified drug, medicinal and biological substance   . Umbilical hernia without mention of obstruction or gangrene   . Sebaceous cyst   . Labyrinthitis, unspecified   . Personal history of other diseases of digestive system   . Special screening for malignant neoplasm of prostate   . BC (bronchogenic carcinoma)     S/P LOBECTOMY 2011  . Shortness of breath   . COPD (chronic obstructive pulmonary disease)   . GERD (gastroesophageal reflux disease)     Assessment: 33 YOM on warfarin PTA for hx Afib and [facility] AVR -  pharmacy consulted to resume this admission. Admitted with a therapeutic INR of 2.57 on PTA dose of 2.5 mg daily EXCEPT for 5 mg on MWF - the patient's last dose PTA was on 10/16. The patient was recently started on Levaquin for CAP as an outpatient which is known to increase warfarin sensitivity. Baseline CBC wnl.   Goal of Therapy:  INR 2.5-3.5   Plan:  1. Warfarin 2.5 mg x 1 dose tonight 2. Daily PT/INR 3. Will continue to monitor for any signs/symptoms of bleeding and will follow up with PT/INR in the a.m.   Alycia Rossetti, PharmD, BCPS Clinical Pharmacist Pager: [phone] 11/19/2013 7:54 PM

## 2013-11-26 NOTE — ED Notes (Signed)
Pt reports he was started on antibiotics this week by PCP after "hearing something in his lungs."

## 2013-11-26 NOTE — H&P (Signed)
Admit date: 11/18/2013 Primary Physician  Olga Millers, MD Primary Cardiologist  Dr. Copper, Dr. Lovena Le  CC: Dyspnea  HPI: [name] is a 80 yo caucasian male with chronic atrial fibrillation, aortic stenosis (s/p SJM AVR), COPD, NCSCA lung s/p resection, pulmonary hypertension (followed by R Byrum) PASP 61mmHg who now presents with chief complaint of dyspnea.   Pt has been having dyspnea for last month now. He has had chronic LE edema which has progressed. He complaints of PND and severe DOE. He is on 3L oxygen at home, which was not helping him anymore. He does not check his weights at home. His weight was 172 in August and 179lbs today. He has progressive LE edema. Pt denies chest pain, palpitations, syncope or pre-syncope. ROS positive for cough with clear sputum production, diarrhea. ROS negative for fever, chills. nausea, vomiting. Of note, pt was seen recently in the cardiology and pulmonary clinic. He was started on spironolactone and continued on lasix 80mg  po bid. He was started on abx by pulmonologist. His latest echo in July showed - mild conc LVH, LVEF 40-45%, RV volume overload pattern, LA severely dilated, PASP 28mmHg, moderate to severe TR. Labs today show elevated BNP. CXR with interstitial edema. ECG shows AF, low voltage.   Pt can't be on amio due to lung tox, tikosyn (even at lower doses) prolonged QTc and multaq (latest med) had to be stopped due to diarrhea. Pt was put on metoprolol by Dr. Copper. Switched back to bisoprolol by Dr. Lovena Le. No plan for AVN ablation and PPM. BB titrated up and CCB added for improved rate control.    PMH:   Past Medical History  Diagnosis Date  . PAF (paroxysmal atrial fibrillation)     a. intolerant to amio (? lung toxicity) and tikosyn (prolonged QT);  b. 07/2012 s/p DCCV;  c. 09/2012 s/p DCCV - Multaq initiated.  . Aortic valve disorders     a. s/p AVR, 2006;  b. chronic coumadin.  . Other primary cardiomyopathies   . Edema     CHRONIC LOWER EXTREMITY  . Unspecified essential hypertension   . Other and unspecified hyperlipidemia   . Heart valve replaced by other means   . Other malaise and fatigue   . Coronary atherosclerosis of native coronary artery   . Acute myocardial infarction, subendocardial infarction, subsequent episode of care   . Unspecified adverse effect of unspecified drug, medicinal and biological substance   . Umbilical hernia without mention of obstruction or gangrene   . Sebaceous cyst   . Labyrinthitis, unspecified   . Personal history of other diseases of digestive system   . Special screening for malignant neoplasm of prostate   . BC (bronchogenic carcinoma)     S/P LOBECTOMY 2011  . Shortness of breath   . COPD (chronic obstructive pulmonary disease)   . GERD (gastroesophageal reflux disease)     PSH:   Past Surgical History  Procedure Laterality Date  . Aortic valve replacement  1994    [facility]  . Appendectomy  [facility]  . Abdominal hernia repair    . Lobectomy  2011  . Cardioversion N/A 08/03/2012    Procedure: CARDIOVERSION;  Surgeon: Sherren Mocha, MD;  Location: [place];  Service: Cardiovascular;  Laterality: N/A;  . Eye surgery      retinal repair  . Cardioversion N/A 09/20/2012    Procedure: CARDIOVERSION;  Surgeon: Evans Lance, MD;  Location: [place];  Service: Cardiovascular;  Laterality:  N/A;   Allergies:  Penicillins Prior to Admit Meds:   Prior to Admission medications   Medication Sig Start Date End Date Taking? Authorizing Provider  acetaminophen (TYLENOL) 500 MG tablet Take 1,000 mg by mouth every 6 (six) hours as needed for pain.    Yes Historical Provider, MD  albuterol (PROVENTIL HFA;VENTOLIN HFA) 108 (90 BASE) MCG/ACT inhaler Inhale 2 puffs into the lungs every 4 (four) hours as needed for wheezing or shortness of breath.   Yes Historical Provider, MD  allopurinol (ZYLOPRIM) 100 MG tablet Take 100 mg by mouth every morning.   Yes Historical Provider, MD    aspirin EC 81 MG tablet Take 81 mg by mouth every morning.   Yes Historical Provider, MD  diltiazem (CARDIZEM CD) 120 MG 24 hr capsule Take 1 capsule (120 mg total) by mouth 2 (two) times daily. 08/19/13  Yes Sherren Mocha, MD  famotidine (PEPCID) 20 MG tablet Take 20 mg by mouth daily as needed for heartburn.    Yes Historical Provider, MD  furosemide (LASIX) 40 MG tablet Take 2 tablets (80 mg total) by mouth 2 (two) times daily. 05/20/13  Yes Sherren Mocha, MD  guaiFENesin (MUCINEX) 600 MG 12 hr tablet Take 600 mg by mouth 2 (two) times daily.   Yes Historical Provider, MD  levofloxacin (LEVAQUIN) 500 MG tablet Take 500 mg by mouth daily. For 7 days. Starting 11/21/2013. Last dose is 11/28/2013.   Yes Historical Provider, MD  metoprolol (LOPRESSOR) 100 MG tablet Take 0.5 tablets (50 mg total) by mouth 2 (two) times daily. 04/12/13  Yes Burtis Junes, NP  NON FORMULARY Place 2-4 L into the nose continuous.    Yes Historical Provider, MD  potassium chloride SA (K-DUR,KLOR-CON) 20 MEQ tablet Take 1 tablet (20 mEq total) by mouth 2 (two) times daily. 11/23/13  Yes Burtis Junes, NP  warfarin (COUMADIN) 2.5 MG tablet Take 2.5 mg by mouth 4 (four) times a week. Tues. Thurs. Sat. Sun.   Yes Historical Provider, MD  warfarin (COUMADIN) 5 MG tablet Take 5 mg by mouth 3 (three) times a week. Mon Wed Fri   Yes Historical Provider, MD   Fam HX:    Family History  Problem Relation Age of Onset  . Colon cancer Father   . Aneurysm Mother     RUPTURE-NECK   . Coronary artery disease Mother    Social HX:    History   Social History  . Marital Status: Married    Spouse Name: N/A    Number of Children: 1  . Years of Education: N/A   Occupational History  . [employer]     PHOTOGRAPHER   Social History Main Topics  . Smoking status: Former Smoker -- 1.00 packs/day for 40 years    Types: Cigarettes    Quit date: 02/10/2009  . Smokeless tobacco: Never Used  . Alcohol Use: Yes     Comment: socially  "once in a while"  . Drug Use: No  . Sexual Activity: No   Other Topics Concern  . Not on file   Social History Narrative   HSG, 2 years college. married '70 . 1 daughter  '70. work: Geophysicist/field seismologist, retired.   ACP - no living will - provided information at today's visit.      ROS:  All 11 ROS were addressed and are negative except what is stated in the HPI   Physical Exam: Blood pressure 121/78, pulse 89, temperature 97.9 F (36.6 C), temperature source  Oral, resp. rate 24, height 5\' 11"  (1.803 m), weight 78.926 kg (174 lb), SpO2 100.00%.   Gen - pt is in moderate resp distress. Pt has BiPAP in place.  Eyes - PERRL, EOMI Neck - JVP to 4cm over clavicle, hepatojugular reflex present CV - a fib, s1s2 heard Lungs - rales at base, mild exp wheezing Abd - +bs, nd, nt Ext - 3+ edema, venous stasis ulcer and dermatitis          Labs:   Lab Results  Component Value Date   WBC 10.6* 11/29/2013   HGB 12.3* 11/18/2013   HCT 39.4 11/11/2013   MCV 94.0 11/14/2013   PLT 260 12/05/2013    Recent Labs Lab 12/01/2013 1622  NA 138  K 4.6  CL 100  CO2 25  BUN 21  CREATININE 1.12  CALCIUM 9.4  GLUCOSE 132*   No results found for this basename: CKTOTAL, CKMB, TROPONINI,  in the last 72 hours Lab Results  Component Value Date   CHOL 148 01/14/2012   HDL 26.90* 01/14/2012   LDLCALC 88 01/14/2012   TRIG 165.0* 01/14/2012   Lab Results  Component Value Date   DDIMER  Value: 1.55        AT THE INHOUSE ESTABLISHED CUTOFF VALUE OF 0.48 ug/mL FEU, THIS ASSAY HAS BEEN DOCUMENTED IN THE LITERATURE TO HAVE A SENSITIVITY AND NEGATIVE PREDICTIVE VALUE OF AT LEAST 98 TO 99%.  THE TEST RESULT SHOULD BE CORRELATED WITH AN ASSESSMENT OF THE CLINICAL PROBABILITY OF DVT / VTE.* 01/23/2010     Radiology:  Dg Chest Portable 1 View  11/14/2013   CLINICAL DATA:  Initial evaluation for shortness of breath 1 week, worse today, current history of COPD  EXAM: PORTABLE CHEST - 1 VIEW  COMPARISON:   08/02/2013  FINDINGS: Moderately severe cardiac enlargement stable from prior study. Mild vascular congestion. Mild diffuse interstitial prominence. There is elevation of the right diaphragm with right base opacity similar to prior study suggesting scarring or atelectasis. There is emphysematous change in the upper lung zones.  IMPRESSION: COPD with chronic scarring in the right base and chronic cardiac enlargement. However, increased interstitial opacities in the mid to lower lung zones currently suggest the possibility of mild cardiogenic interstitial pulmonary edema.   Electronically Signed   By: Skipper Cliche M.D.   On: 12/05/2013 17:08   Personally viewed.   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 Net goal 2 L negative.  - May require metolazone in am.  - cont home BB. - pt is not on after load reducing agents. Start on lisinopril 2.5mg  po qd. Titrate as tolerated by BP.  - strict in and outs - daily weights  Chronic lung disease, AECOPD - will complete levaquin course (last dose on 19th) - optimize pulm tx - start on spiriva and symbicort.  - nebs prn. No need for high dose steroid at this time.  - cont 3L oxygen per home regiment  VHD - INR goal 2.5-3.5 - Pharmacy consult placed for continuation of coumadin  Chronic AF - rate controlled - cont BB and CCB at this time.  - cont [place] as above  FEN - SL IVF - electrolyte replacement prn - regular diet  Ethics - full code  [place] after acute illness resolves  Orson Gear, MD  11/21/2013  7:15 PM

## 2013-11-26 NOTE — ED Notes (Signed)
Pt from home via [facility] with c/o increasing shortness of breath starting this am upon waking.  Pt states he is normally on 3L nasal cannula with SpO2 84-99%.  Pt used his home inhalers several times throughout the day with no relief.  On EMS arrival pt SpO2 84%, placed on 6L nasal cannula, increased to 88%, placed on non-rebreather, increased to 100%.  Pt work of breathing decreased.  When speaking SpO2 decreases to 92% on non-rebreather.  Pt in NAD, A&O.

## 2013-11-27 LAB — BASIC METABOLIC PANEL
Anion gap: 10 (ref 5–15)
BUN: 19 mg/dL (ref 6–23)
CO2: 29 mEq/L (ref 19–32)
Calcium: 9 mg/dL (ref 8.4–10.5)
Chloride: 101 mEq/L (ref 96–112)
Creatinine, Ser: 1.13 mg/dL (ref 0.50–1.35)
GFR, EST AFRICAN AMERICAN: 70 mL/min — AB (ref >90)
GFR, EST NON AFRICAN AMERICAN: 60 mL/min — AB (ref >90)
Glucose, Bld: 92 mg/dL (ref 70–99)
POTASSIUM: 4.6 meq/L (ref 3.7–5.3)
Sodium: 140 mEq/L (ref 137–147)

## 2013-11-27 LAB — PROTIME-INR
INR: 2.65 — ABNORMAL HIGH (ref 0.00–1.49)
Prothrombin Time: 28.5 seconds — ABNORMAL HIGH (ref 11.6–15.2)

## 2013-11-27 MED ORDER — CETYLPYRIDINIUM CHLORIDE 0.05 % MT LIQD
7.0000 mL | Freq: Two times a day (BID) | OROMUCOSAL | Status: DC
  Administered 2013-11-27 – 2013-12-09 (×24): 7 mL via OROMUCOSAL

## 2013-11-27 MED ORDER — WARFARIN SODIUM 2.5 MG PO TABS
2.5000 mg | ORAL_TABLET | Freq: Once | ORAL | Status: AC
  Administered 2013-11-27: 2.5 mg via ORAL
  Filled 2013-11-27: qty 1

## 2013-11-27 NOTE — Progress Notes (Signed)
ANTICOAGULATION CONSULT NOTE - Follow-Up Consult  Pharmacy Consult for Warfarin Indication: atrial fibrillation  Allergies  Allergen Reactions  . Penicillins Hives, Swelling and Rash     facial swelling    Patient Measurements: Height: 5\' 11"  (180.3 cm) Weight: 168 lb 10.4 oz (76.5 kg) IBW/kg (Calculated) : 75.3  Vital Signs: Temp: 98.5 F (36.9 C) (10/18 0732) Temp Source: Oral (10/18 0732) BP: 94/53 mmHg (10/18 0800) Pulse Rate: 79 (10/18 0800)  Labs:  Recent Labs  11/24/13 1327 12/06/2013 1622 12/03/2013 2100 11/27/13 0303  HGB  --  12.3*  --   --   HCT  --  39.4  --   --   PLT  --  260  --   --   LABPROT  --   --  27.8* 28.5*  INR 3.1  --  2.57* 2.65*  CREATININE  --  1.12  --  1.13    Estimated Creatinine Clearance: 57.4 ml/min (by C-G formula based on Cr of 1.13).   Medical History: Past Medical History  Diagnosis Date  . PAF (paroxysmal atrial fibrillation)     a. intolerant to amio (? lung toxicity) and tikosyn (prolonged QT);  b. 07/2012 s/p DCCV;  c. 09/2012 s/p DCCV - Multaq initiated.  . Aortic valve disorders     a. s/p AVR, 2006;  b. chronic coumadin.  . Other primary cardiomyopathies   . Edema     CHRONIC LOWER EXTREMITY  . Unspecified essential hypertension   . Other and unspecified hyperlipidemia   . Heart valve replaced by other means   . Other malaise and fatigue   . Coronary atherosclerosis of native coronary artery   . Acute myocardial infarction, subendocardial infarction, subsequent episode of care   . Unspecified adverse effect of unspecified drug, medicinal and biological substance   . Umbilical hernia without mention of obstruction or gangrene   . Sebaceous cyst   . Labyrinthitis, unspecified   . Personal history of other diseases of digestive system   . Special screening for malignant neoplasm of prostate   . BC (bronchogenic carcinoma)     S/P LOBECTOMY 2011  . Shortness of breath   . COPD (chronic obstructive pulmonary disease)    . GERD (gastroesophageal reflux disease)     Assessment: 22 YOM on warfarin PTA for hx Afib and [facility] AVR - pharmacy consulted to resume in hospital. Admitted with a therapeutic INR of 2.57 on PTA dose of 2.5 mg daily EXCEPT for 5 mg on MWF - the patient's last dose PTA was on 10/16. The patient was recently started on Levaquin for CAP as an outpatient which is known to increase warfarin sensitivity. Baseline CBC wnl.   Today's INR is therapeutic at 2.65.  No bleeding or complications noted.  Goal of Therapy:  INR 2.5-3.5   Plan:  1. Warfarin 2.5 mg x 1 dose tonight (c/w home dose) 2. Daily PT/INR 3. Will continue to monitor for any signs/symptoms of bleeding and will follow up with PT/INR in the a.m.   Uvaldo Rising, BCPS  Clinical Pharmacist Pager [phone]  11/27/2013 8:39 AM

## 2013-11-27 NOTE — Progress Notes (Signed)
Patient Name: John [name] Date of Encounter: 11/27/2013     Active Problems:   CHF (congestive heart failure)   Heart failure    SUBJECTIVE  Patient is less dyspneic this am. Good diuresis on IV lasix. Heart rate controlled on diltiazem and metoprolol. On levoquin for recent cellulitis treatment. Peripheral edema much improved according to wife.  CURRENT MEDS . allopurinol  100 mg Oral BH-q7a  . antiseptic oral rinse  7 mL Mouth Rinse BID  . aspirin EC  81 mg Oral Daily  . budesonide-formoterol  2 puff Inhalation BID  . diltiazem  120 mg Oral BID  . furosemide  80 mg Intravenous BID  . guaiFENesin  600 mg Oral BID  . Influenza vac split quadrivalent PF  0.5 mL Intramuscular Tomorrow-1000  . levofloxacin  500 mg Oral Daily  . lisinopril  2.5 mg Oral Daily  . metoprolol  50 mg Oral BID  . sodium chloride  3 mL Intravenous Q12H  . tiotropium  18 mcg Inhalation Daily  . warfarin  2.5 mg Oral ONCE-1800  . Warfarin - Pharmacist Dosing Inpatient   Does not apply q1800    OBJECTIVE  Filed Vitals:   11/27/13 0732 11/27/13 0800 11/27/13 0810 11/27/13 0900  BP:  94/53  116/70  Pulse: 80 79  104  Temp: 98.5 F (36.9 C)     TempSrc: Oral     Resp: 25 32  26  Height:      Weight:      SpO2: 96% 94% 95% 94%    Intake/Output Summary (Last 24 hours) at 11/27/13 1135 Last data filed at 11/27/13 0900  Gross per 24 hour  Intake    480 ml  Output   1825 ml  Net  -1345 ml   Filed Weights   11/23/2013 1613 11/18/2013 1941 11/27/13 0500  Weight: 174 lb (78.926 kg) 169 lb 12.1 oz (77 kg) 168 lb 10.4 oz (76.5 kg)    PHYSICAL EXAM  General: Pleasant, NAD. Nasal oxygen in place. Neuro: Alert and oriented X 3. Moves all extremities spontaneously. Psych: Normal affect. HEENT:  Normal  Neck: Supple without bruits or JVD. Lungs:  Bilateral rales. Heart: RRR no s3, s4, or murmurs. Good mechanical aortic valve click Abdomen: Soft, non-tender, non-distended, BS + x 4.    Extremities: No clubbing, cyanosis. Moderate edema. DP/PT/Radials 2+ and equal bilaterally.  Accessory Clinical Findings  CBC  Recent Labs  11/18/2013 1622  WBC 10.6*  HGB 12.3*  HCT 39.4  MCV 94.0  PLT 209   Basic Metabolic Panel  Recent Labs  11/27/2013 1622 11/27/13 0303  NA 138 140  K 4.6 4.6  CL 100 101  CO2 25 29  GLUCOSE 132* 92  BUN 21 19  CREATININE 1.12 1.13  CALCIUM 9.4 9.0   Liver Function Tests No results found for this basename: AST, ALT, ALKPHOS, BILITOT, PROT, ALBUMIN,  in the last 72 hours No results found for this basename: LIPASE, AMYLASE,  in the last 72 hours Cardiac Enzymes No results found for this basename: CKTOTAL, CKMB, CKMBINDEX, TROPONINI,  in the last 72 hours BNP No components found with this basename: POCBNP,  D-Dimer No results found for this basename: DDIMER,  in the last 72 hours Hemoglobin A1C No results found for this basename: HGBA1C,  in the last 72 hours Fasting Lipid Panel No results found for this basename: CHOL, HDL, LDLCALC, TRIG, CHOLHDL, LDLDIRECT,  in the last 72 hours Thyroid Function Tests No  results found for this basename: TSH, T4TOTAL, FREET3, T3FREE, THYROIDAB,  in the last 72 hours  TELE  Atrial fibrillation with controlled VR  ECG  Atrial fibrillation ST & T wave abnormality, consider lateral ischemia Prolonged QT Abnormal ECG  Radiology/Studies  Dg Chest Portable 1 View  11/13/2013   CLINICAL DATA:  Initial evaluation for shortness of breath 1 week, worse today, current history of COPD  EXAM: PORTABLE CHEST - 1 VIEW  COMPARISON:  08/02/2013  FINDINGS: Moderately severe cardiac enlargement stable from prior study. Mild vascular congestion. Mild diffuse interstitial prominence. There is elevation of the right diaphragm with right base opacity similar to prior study suggesting scarring or atelectasis. There is emphysematous change in the upper lung zones.  IMPRESSION: COPD with chronic scarring in the right  base and chronic cardiac enlargement. However, increased interstitial opacities in the mid to lower lung zones currently suggest the possibility of mild cardiogenic interstitial pulmonary edema.   Electronically Signed   By: Skipper Cliche M.D.   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   cont home BB.  - pt is not on after load reducing agents. Start on lisinopril 2.5mg  po qd. Titrate as tolerated by BP.  - strict in and outs  - daily weights  Chronic lung disease, AECOPD  - will complete levaquin course (last dose on 19th)  - optimize pulm tx - start on spiriva and symbicort.  - nebs prn. No need for high dose steroid at this time.  - cont 3L oxygen per home regiment  VHD - INR goal 2.5-3.5  - Pharmacy consult placed for continuation of coumadin  Chronic AF  - rate controlled  - cont BB and CCB at this time.  - cont [place] as above    Signed, Darlin Coco MD

## 2013-11-28 DIAGNOSIS — I482 Chronic atrial fibrillation: Secondary | ICD-10-CM

## 2013-11-28 DIAGNOSIS — Z954 Presence of other heart-valve replacement: Secondary | ICD-10-CM

## 2013-11-28 LAB — BASIC METABOLIC PANEL
ANION GAP: 10 (ref 5–15)
BUN: 20 mg/dL (ref 6–23)
CO2: 30 meq/L (ref 19–32)
CREATININE: 1.06 mg/dL (ref 0.50–1.35)
Calcium: 9.4 mg/dL (ref 8.4–10.5)
Chloride: 97 mEq/L (ref 96–112)
GFR calc non Af Amer: 65 mL/min — ABNORMAL LOW (ref >90)
GFR, EST AFRICAN AMERICAN: 76 mL/min — AB (ref >90)
GLUCOSE: 106 mg/dL — AB (ref 70–99)
Potassium: 3.9 mEq/L (ref 3.7–5.3)
SODIUM: 137 meq/L (ref 137–147)

## 2013-11-28 LAB — PROTIME-INR
INR: 2.5 — AB (ref 0.00–1.49)
PROTHROMBIN TIME: 27.2 s — AB (ref 11.6–15.2)

## 2013-11-28 MED ORDER — ALPRAZOLAM 0.5 MG PO TABS
0.5000 mg | ORAL_TABLET | Freq: Three times a day (TID) | ORAL | Status: DC | PRN
  Administered 2013-11-28 – 2013-12-04 (×3): 0.5 mg via ORAL
  Filled 2013-11-28 (×4): qty 1

## 2013-11-28 MED ORDER — ENSURE COMPLETE PO LIQD
237.0000 mL | Freq: Two times a day (BID) | ORAL | Status: DC
  Administered 2013-11-29 – 2013-12-07 (×14): 237 mL via ORAL

## 2013-11-28 MED ORDER — ALPRAZOLAM 0.25 MG PO TABS: 0.2500 mg | ORAL_TABLET | Freq: Two times a day (BID) | ORAL | Status: DC | PRN

## 2013-11-28 MED ORDER — FUROSEMIDE 10 MG/ML IJ SOLN
80.0000 mg | Freq: Two times a day (BID) | INTRAMUSCULAR | Status: DC
  Administered 2013-11-29 – 2013-12-01 (×6): 80 mg via INTRAVENOUS
  Filled 2013-11-28 (×7): qty 8

## 2013-11-28 MED ORDER — WARFARIN SODIUM 5 MG PO TABS
5.0000 mg | ORAL_TABLET | Freq: Once | ORAL | Status: AC
  Administered 2013-11-28: 5 mg via ORAL
  Filled 2013-11-28: qty 1

## 2013-11-28 NOTE — Progress Notes (Signed)
ANTICOAGULATION CONSULT NOTE - Follow-Up Consult  Pharmacy Consult for Warfarin Indication: atrial fibrillation  Allergies  Allergen Reactions  . Penicillins Hives, Swelling and Rash     facial swelling    Patient Measurements: Height: 5\' 11"  (180.3 cm) Weight: 167 lb 15.9 oz (76.2 kg) IBW/kg (Calculated) : 75.3  Vital Signs: Temp: 98.2 F (36.8 C) (10/19 1215) Temp Source: Oral (10/19 1215) BP: 94/54 mmHg (10/19 1400) Pulse Rate: 88 (10/19 1400)  Labs:  Recent Labs  11/14/2013 1622 11/25/2013 2100 11/27/13 0303 11/28/13 0321  HGB 12.3*  --   --   --   HCT 39.4  --   --   --   PLT 260  --   --   --   LABPROT  --  27.8* 28.5* 27.2*  INR  --  2.57* 2.65* 2.50*  CREATININE 1.12  --  1.13 1.06    Estimated Creatinine Clearance: 61.2 ml/min (by C-G formula based on Cr of 1.06).   Medical History: Past Medical History  Diagnosis Date  . PAF (paroxysmal atrial fibrillation)     a. intolerant to amio (? lung toxicity) and tikosyn (prolonged QT);  b. 07/2012 s/p DCCV;  c. 09/2012 s/p DCCV - Multaq initiated.  . Aortic valve disorders     a. s/p AVR, 2006;  b. chronic coumadin.  . Other primary cardiomyopathies   . Edema     CHRONIC LOWER EXTREMITY  . Unspecified essential hypertension   . Other and unspecified hyperlipidemia   . Heart valve replaced by other means   . Other malaise and fatigue   . Coronary atherosclerosis of native coronary artery   . Acute myocardial infarction, subendocardial infarction, subsequent episode of care   . Unspecified adverse effect of unspecified drug, medicinal and biological substance   . Umbilical hernia without mention of obstruction or gangrene   . Sebaceous cyst   . Labyrinthitis, unspecified   . Personal history of other diseases of digestive system   . Special screening for malignant neoplasm of prostate   . BC (bronchogenic carcinoma)     S/P LOBECTOMY 2011  . Shortness of breath   . COPD (chronic obstructive pulmonary  disease)   . GERD (gastroesophageal reflux disease)     Assessment: 55 YOM admitted on 12/05/2013 with SOB upon waking. Pharmacy consulted to dose warfarin.  PMH: PAF (lung tox with amio, tikosyn prolonged QT), s/p AVR, HTN/HLD, CAD, COPD, GERD  AC:  Afib and [facility] AVR -  Admitted with a therapeutic INR of 2.57 on PTA dose of 2.5 mg daily EXCEPT for 5 mg on MWF - the patient's last dose PTA was on 10/16. The patient was recently started on Levaquin for CAP as an outpatient which is known to increase warfarin sensitivity. Baseline CBC wnl.  Today's INR at goal  ID: Completed course of LVQ (started PTA) for CAP- con't thru 10/19 per MD note  Goal of Therapy:  INR 2.5-3.5   Plan:  1. Warfarin 5 mg x 1 dose tonight (c/w home dose) 2. Daily PT/INR 3. Will continue to monitor for any signs/symptoms of bleeding and will follow up with PT/INR in the a.m.   Thank you for allowing pharmacy to be a part of this patients care team.  Rowe Robert Pharm.D., BCPS, AQ-Cardiology Clinical Pharmacist 11/28/2013 2:58 PM Pager: [phone] Phone: [phone]

## 2013-11-28 NOTE — Clinical Documentation Improvement (Signed)
MD's, NP's, PA's  Per nutrition consult note on 10/19 patient meets criteria for "severe malnutrition"  "evidenced by severe depletion of fat and muscle"   "Inadequate oral intake related to O2 requirements, exertion of eating, SOB as evidenced by meal completion < 25%. ", BMI 23.44.  If either of the following clinical conditions are appropriate please document in notes.  Thank you  Possible Clinical Conditions?  Severe Malnutrition    Protein Calorie Malnutrition  Severe Protein Calorie Malnutrition  Other Condition  Cannot clinically determine    Risk Factors: COPD exac O2 dependent, Acute on Chronic  Systolic heart failure, Cellulitis   Nutritional Consult/Evaluation: SEVERE MALNUTRITION in the context of chronic illness as evidenced by severe depletion of fat and muscle.   Recommended Intervention:  Ensure Complete po BID, each supplement provides 350 kcal and 13 grams of protein   Magic cup TID with meals, each supplement provides 290 kcal and 9 grams of protein      Thank You, Ree Kida ,RN Clinical Documentation Specialist:  [phone]  [facility] [employer] [facility]

## 2013-11-28 NOTE — Progress Notes (Signed)
Pt well-known to me. His wife had called over the weekend to ask my advice considering his progressive shortness of breath and leg swelling.   I have taken care of Mr Vipond for several years and he has exhibited progressive dyspnea and hypoxemia, presumably related to severe COPD, amiodarone lung toxicity, congestive heart failure, and lung volume loss after surgical lobectomy.   Despite diuresis in the hospital this admission, he continues to feel very short of breath at rest.   I had a frank discussion with the patient and his wife today about the limitations of our medical therapy and what I perceive as a very poor prognosis with respect to his cardiopulmonary disease. I brought up the idea of palliative care, but they do not seem ready for this. I asked them to at least consider a meeting with the palliative care if he does not improve over the next few days. At his request, I increased his Xanax to help with anxiety related to shortness of breath.  Sherren Mocha 11/28/2013 6:40 PM

## 2013-11-28 NOTE — Progress Notes (Signed)
Claimed to be able  to sleep and feels better after xanax was given.

## 2013-11-28 NOTE — Progress Notes (Signed)
With poor appetite, requested for ensure given, tolerated well.

## 2013-11-28 NOTE — Progress Notes (Signed)
Patient Name: John [name] Date of Encounter: 11/28/2013     Active Problems:   CHF (congestive heart failure)   Heart failure    SUBJECTIVE  Patient is less dyspneic this am. Good diuresis on IV lasix. Heart rate controlled on diltiazem and metoprolol. On levoquin for recent cellulitis treatment. Peripheral edema much improved according to wife.   He is still short of breath.  CURRENT MEDS . allopurinol  100 mg Oral BH-q7a  . antiseptic oral rinse  7 mL Mouth Rinse BID  . aspirin EC  81 mg Oral Daily  . budesonide-formoterol  2 puff Inhalation BID  . diltiazem  120 mg Oral BID  . furosemide  80 mg Intravenous BID  . guaiFENesin  600 mg Oral BID  . Influenza vac split quadrivalent PF  0.5 mL Intramuscular Tomorrow-1000  . lisinopril  2.5 mg Oral Daily  . metoprolol  50 mg Oral BID  . sodium chloride  3 mL Intravenous Q12H  . tiotropium  18 mcg Inhalation Daily  . Warfarin - Pharmacist Dosing Inpatient   Does not apply q1800    OBJECTIVE  Filed Vitals:   11/28/13 0800 11/28/13 0900 11/28/13 1000 11/28/13 1002  BP: 120/69 120/75 98/64   Pulse: 82 86 85   Temp:      TempSrc:      Resp: 22 25 23    Height:      Weight:      SpO2: 92% 94% 93% 92%    Intake/Output Summary (Last 24 hours) at 11/28/13 1050 Last data filed at 11/28/13 0900  Gross per 24 hour  Intake    510 ml  Output   1100 ml  Net   -590 ml   Filed Weights   11/17/2013 1941 11/27/13 0500 11/28/13 0422  Weight: 169 lb 12.1 oz (77 kg) 168 lb 10.4 oz (76.5 kg) 167 lb 15.9 oz (76.2 kg)    PHYSICAL EXAM  General: Pleasant, NAD. Nasal oxygen in place. Neuro: Alert and oriented X 3. Moves all extremities spontaneously. Psych: Normal affect. HEENT:  Normal  Neck: Supple without bruits or JVD. Lungs: Bilateral wheezing  Heart: RRR no s3, s4, or murmurs. Good mechanical aortic valve click Abdomen: Soft, non-tender, non-distended, BS + x 4.  Extremities: No clubbing, cyanosis. Moderate edema.  DP/PT/Radials 2+ and equal bilaterally.  Accessory Clinical Findings  CBC  Recent Labs  12/07/2013 1622  WBC 10.6*  HGB 12.3*  HCT 39.4  MCV 94.0  PLT 431   Basic Metabolic Panel  Recent Labs  11/27/13 0303 11/28/13 0321  NA 140 137  K 4.6 3.9  CL 101 97  CO2 29 30  GLUCOSE 92 106*  BUN 19 20  CREATININE 1.13 1.06  CALCIUM 9.0 9.4   Liver Function Tests No results found for this basename: AST, ALT, ALKPHOS, BILITOT, PROT, ALBUMIN,  in the last 72 hours No results found for this basename: LIPASE, AMYLASE,  in the last 72 hours Cardiac Enzymes No results found for this basename: CKTOTAL, CKMB, CKMBINDEX, TROPONINI,  in the last 72 hours BNP No components found with this basename: POCBNP,  D-Dimer No results found for this basename: DDIMER,  in the last 72 hours Hemoglobin A1C No results found for this basename: HGBA1C,  in the last 72 hours Fasting Lipid Panel No results found for this basename: CHOL, HDL, LDLCALC, TRIG, CHOLHDL, LDLDIRECT,  in the last 72 hours Thyroid Function Tests No results found for this basename: TSH, T4TOTAL, FREET3, T3FREE,  THYROIDAB,  in the last 72 hours  TELE  Atrial fibrillation with controlled VR  ECG  Atrial fibrillation ST & T wave abnormality, consider lateral ischemia Prolonged QT Abnormal ECG  Radiology/Studies  Dg Chest Portable 1 View  11/17/2013   CLINICAL DATA:  Initial evaluation for shortness of breath 1 week, worse today, current history of COPD  EXAM: PORTABLE CHEST - 1 VIEW  COMPARISON:  08/02/2013  FINDINGS: Moderately severe cardiac enlargement stable from prior study. Mild vascular congestion. Mild diffuse interstitial prominence. There is elevation of the right diaphragm with right base opacity similar to prior study suggesting scarring or atelectasis. There is emphysematous change in the upper lung zones.  IMPRESSION: COPD with chronic scarring in the right base and chronic cardiac enlargement. However,  increased interstitial opacities in the mid to lower lung zones currently suggest the possibility of mild cardiogenic interstitial pulmonary edema.   Electronically Signed   By: Skipper Cliche M.D.   On: 11/20/2013 17:08    ASSESSMENT AND PLAN 1. 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   cont home BB.  - pt is not on after load reducing agents. Start on lisinopril 2.5mg  po qd. Titrate as tolerated by BP.  - strict in and outs  - daily weights    2.  Chronic lung disease, AECOPD :  - will complete levaquin course (last dose on 19th)  - optimize pulm tx - start on spiriva and symbicort.  - nebs prn. No need for high dose steroid at this time.  - cont 3L oxygen per home regiment   3. S/p aortic valve replacement - with mechanical valve  - INR goal 2.5-3.5  - Pharmacy consult placed for continuation of coumadin   4.  Chronic AF  - rate controlled  - cont BB and CCB at this time.  - cont [place] as above    Thayer Headings, Brooke Bonito., MD, [facility] 11/28/2013, 10:54 AM [address]  [place] Pager [phone]

## 2013-11-28 NOTE — Progress Notes (Signed)
INITIAL NUTRITION ASSESSMENT  Pt meets criteria for SEVERE MALNUTRITION in the context of chronic illness as evidenced by severe depletion of fat and muscle.  DOCUMENTATION CODES Per approved criteria  -Not Applicable   INTERVENTION: Ensure Complete po BID, each supplement provides 350 kcal and 13 grams of protein  Magic cup TID with meals, each supplement provides 290 kcal and 9 grams of protein  NUTRITION DIAGNOSIS: Inadequate oral intake related to O2 requirements, exertion of eating, SOB as evidenced by meal completion < 25%.   Goal: Pt to meet >/= 90% of their estimated nutrition needs   Monitor:  PO intake, supplement acceptance, weight trends  Reason for Assessment: MD Consult/Pt identified as at nutrition risk on the Malnutrition Screen Tool  80 y.o. male  Admitting Dx: CHF  ASSESSMENT: Pt with chronic a fib, aortic stenosis, COPD, [facility] s/p resection, and pulmonary hypertension admitted with dyspnea. Pt on 3 L O2 at home.   Pt with O2 mask on, wife provide most of pt's hx. Per wife pt is usually around 170 lb with some fluid on board. Weight fluctuates based on edema. They have been following a low sodium diet at home with fresh fruits and vegetables. Wife seems very knowledgeable about sodium restriction and cooks at home mostly.  Pt usually has good appetite but his appetite has been decreased for the last week due to being SOB. Per wife eating is difficult for pt, he has lost his taste for food.  She had started to give pt ensure at home, he was drinking 1-2 cans per day.   Nutrition Focused Physical Exam:  Subcutaneous Fat:  Orbital Region: WDL Upper Arm Region: severe depletion  Thoracic and Lumbar Region: severe depletion   Muscle:  Temple Region: severe depletion  Clavicle Bone Region: severe depletion Clavicle and Acromion Bone Region: severe depletion  Scapular Bone Region: severe depletion  Dorsal Hand: severe depleiton  Patellar Region: mild  depleiton Anterior Thigh Region: WDL Posterior Calf Region: NA - edema  Edema: present +2  Height: Ht Readings from Last 1 Encounters:  12/08/2013 5\' 11"  (1.803 m)    Weight: Wt Readings from Last 1 Encounters:  11/28/13 167 lb 15.9 oz (76.2 kg)    Ideal Body Weight: 78.1 kg   % Ideal Body Weight: 98%  Wt Readings from Last 10 Encounters:  11/28/13 167 lb 15.9 oz (76.2 kg)  11/21/13 173 lb (78.472 kg)  11/11/13 176 lb (79.833 kg)  10/06/13 172 lb 8 oz (78.245 kg)  08/19/13 172 lb 1.9 oz (78.073 kg)  08/02/13 171 lb (77.565 kg)  06/02/13 177 lb (80.287 kg)  05/10/13 186 lb (84.369 kg)  04/12/13 176 lb 1.9 oz (79.888 kg)  04/06/13 179 lb (81.194 kg)    Usual Body Weight: 160-170 lb   % Usual Body Weight: 100%  BMI:  Body mass index is 23.44 kg/(m^2).  Estimated Nutritional Needs: Kcal: 1900-2100 Protein: 95-110 grams Fluid: > 1.5 L/day  Skin: WDL  Diet Order: Cardiac Only bites at meals.   EDUCATION NEEDS: -No education needs identified at this time   Intake/Output Summary (Last 24 hours) at 11/28/13 1017 Last data filed at 11/28/13 0900  Gross per 24 hour  Intake    510 ml  Output   1100 ml  Net   -590 ml    Last BM: PTA   Labs:   Recent Labs Lab 11/24/2013 1622 11/27/13 0303 11/28/13 0321  NA 138 140 137  K 4.6 4.6 3.9  CL  100 101 97  CO2 25 29 30   BUN 21 19 20   CREATININE 1.12 1.13 1.06  CALCIUM 9.4 9.0 9.4  GLUCOSE 132* 92 106*    CBG (last 3)  No results found for this basename: GLUCAP,  in the last 72 hours  Scheduled Meds: . allopurinol  100 mg Oral BH-q7a  . antiseptic oral rinse  7 mL Mouth Rinse BID  . aspirin EC  81 mg Oral Daily  . budesonide-formoterol  2 puff Inhalation BID  . diltiazem  120 mg Oral BID  . furosemide  80 mg Intravenous BID  . guaiFENesin  600 mg Oral BID  . Influenza vac split quadrivalent PF  0.5 mL Intramuscular Tomorrow-1000  . lisinopril  2.5 mg Oral Daily  . metoprolol  50 mg Oral BID  .  sodium chloride  3 mL Intravenous Q12H  . tiotropium  18 mcg Inhalation Daily  . Warfarin - Pharmacist Dosing Inpatient   Does not apply q1800    Continuous Infusions:   Past Medical History  Diagnosis Date  . PAF (paroxysmal atrial fibrillation)     a. intolerant to amio (? lung toxicity) and tikosyn (prolonged QT);  b. 07/2012 s/p DCCV;  c. 09/2012 s/p DCCV - Multaq initiated.  . Aortic valve disorders     a. s/p AVR, 2006;  b. chronic coumadin.  . Other primary cardiomyopathies   . Edema     CHRONIC LOWER EXTREMITY  . Unspecified essential hypertension   . Other and unspecified hyperlipidemia   . Heart valve replaced by other means   . Other malaise and fatigue   . Coronary atherosclerosis of native coronary artery   . Acute myocardial infarction, subendocardial infarction, subsequent episode of care   . Unspecified adverse effect of unspecified drug, medicinal and biological substance   . Umbilical hernia without mention of obstruction or gangrene   . Sebaceous cyst   . Labyrinthitis, unspecified   . Personal history of other diseases of digestive system   . Special screening for malignant neoplasm of prostate   . BC (bronchogenic carcinoma)     S/P LOBECTOMY 2011  . Shortness of breath   . COPD (chronic obstructive pulmonary disease)   . GERD (gastroesophageal reflux disease)     Past Surgical History  Procedure Laterality Date  . Aortic valve replacement  1994    [facility]  . Appendectomy  [facility]  . Abdominal hernia repair    . Lobectomy  2011  . Cardioversion N/A 08/03/2012    Procedure: CARDIOVERSION;  Surgeon: Sherren Mocha, MD;  Location: [place];  Service: Cardiovascular;  Laterality: N/A;  . Eye surgery      retinal repair  . Cardioversion N/A 09/20/2012    Procedure: CARDIOVERSION;  Surgeon: Evans Lance, MD;  Location: [place] [facility];  Service: Cardiovascular;  Laterality: N/A;    Maylon Peppers RD, [place], [place] Pager [phone] After Hours Pager

## 2013-11-28 NOTE — Clinical Documentation Improvement (Signed)
MD's, NP's, and PA's  Documentation of "respiratory distress"  and "hypoxia" respirations on admit( 27-35) O2 sat 86%, 82% on room air  patient home O2 requirement 3 L placed on 4L then 15 L non rebreather mask, BIPAP  and currently on Venturi mask.  Please document appropriate clinical condition for this admission.  Thank you   Possible Clinical Conditions?  Acute Respiratory Failure  Acute on Chronic Respiratory Failure  Acute Respiratory Insufficiency  Other Condition  Cannot Clinically Determine    Risk Factors: Left Ventricle Failure, Acute on Chronic Systolic Heart Failure , COPD and Asthma with acute  exacerbations    Treatment: IV Lasix, BIPAP  Thank You, Ree Kida ,RN Clinical Documentation Specialist:  [phone]  [facility]

## 2013-11-29 DIAGNOSIS — E43 Unspecified severe protein-calorie malnutrition: Secondary | ICD-10-CM

## 2013-11-29 DIAGNOSIS — J441 Chronic obstructive pulmonary disease with (acute) exacerbation: Secondary | ICD-10-CM | POA: Diagnosis not present

## 2013-11-29 DIAGNOSIS — I4891 Unspecified atrial fibrillation: Secondary | ICD-10-CM

## 2013-11-29 DIAGNOSIS — J9611 Chronic respiratory failure with hypoxia: Secondary | ICD-10-CM

## 2013-11-29 DIAGNOSIS — I5023 Acute on chronic systolic (congestive) heart failure: Secondary | ICD-10-CM

## 2013-11-29 DIAGNOSIS — I5031 Acute diastolic (congestive) heart failure: Secondary | ICD-10-CM

## 2013-11-29 DIAGNOSIS — J9601 Acute respiratory failure with hypoxia: Secondary | ICD-10-CM | POA: Diagnosis present

## 2013-11-29 DIAGNOSIS — I509 Heart failure, unspecified: Secondary | ICD-10-CM

## 2013-11-29 DIAGNOSIS — J9621 Acute and chronic respiratory failure with hypoxia: Secondary | ICD-10-CM

## 2013-11-29 LAB — BASIC METABOLIC PANEL
ANION GAP: 12 (ref 5–15)
BUN: 24 mg/dL — ABNORMAL HIGH (ref 6–23)
CALCIUM: 9.3 mg/dL (ref 8.4–10.5)
CO2: 30 mEq/L (ref 19–32)
Chloride: 96 mEq/L (ref 96–112)
Creatinine, Ser: 1.03 mg/dL (ref 0.50–1.35)
GFR calc Af Amer: 78 mL/min — ABNORMAL LOW (ref >90)
GFR, EST NON AFRICAN AMERICAN: 67 mL/min — AB (ref >90)
Glucose, Bld: 98 mg/dL (ref 70–99)
Potassium: 4 mEq/L (ref 3.7–5.3)
SODIUM: 138 meq/L (ref 137–147)

## 2013-11-29 LAB — PROTIME-INR
INR: 2.82 — ABNORMAL HIGH (ref 0.00–1.49)
Prothrombin Time: 29.9 seconds — ABNORMAL HIGH (ref 11.6–15.2)

## 2013-11-29 MED ORDER — BUDESONIDE 0.25 MG/2ML IN SUSP
0.5000 mg | Freq: Two times a day (BID) | RESPIRATORY_TRACT | Status: DC
  Administered 2013-11-29 – 2013-12-09 (×17): 0.5 mg via RESPIRATORY_TRACT
  Filled 2013-11-29 (×24): qty 4

## 2013-11-29 MED ORDER — ALBUTEROL SULFATE (2.5 MG/3ML) 0.083% IN NEBU
2.5000 mg | INHALATION_SOLUTION | RESPIRATORY_TRACT | Status: DC | PRN
  Administered 2013-12-07: 2.5 mg via RESPIRATORY_TRACT
  Filled 2013-11-29: qty 3

## 2013-11-29 MED ORDER — COLCHICINE 0.6 MG PO TABS
0.6000 mg | ORAL_TABLET | Freq: Every day | ORAL | Status: DC
  Administered 2013-11-30 – 2013-12-04 (×5): 0.6 mg via ORAL
  Filled 2013-11-29 (×10): qty 1

## 2013-11-29 MED ORDER — COLCHICINE 0.6 MG PO TABS
1.2000 mg | ORAL_TABLET | Freq: Once | ORAL | Status: AC
  Administered 2013-11-29: 1.2 mg via ORAL
  Filled 2013-11-29: qty 2

## 2013-11-29 MED ORDER — IPRATROPIUM-ALBUTEROL 0.5-2.5 (3) MG/3ML IN SOLN
3.0000 mL | Freq: Four times a day (QID) | RESPIRATORY_TRACT | Status: DC
  Administered 2013-11-29 – 2013-11-30 (×4): 3 mL via RESPIRATORY_TRACT
  Filled 2013-11-29 (×5): qty 3

## 2013-11-29 MED ORDER — WARFARIN SODIUM 2.5 MG PO TABS
2.5000 mg | ORAL_TABLET | Freq: Once | ORAL | Status: AC
  Administered 2013-11-29: 2.5 mg via ORAL
  Filled 2013-11-29 (×2): qty 1

## 2013-11-29 MED ORDER — ARFORMOTEROL TARTRATE 15 MCG/2ML IN NEBU
15.0000 ug | INHALATION_SOLUTION | Freq: Two times a day (BID) | RESPIRATORY_TRACT | Status: DC
  Administered 2013-11-29 – 2013-12-09 (×17): 15 ug via RESPIRATORY_TRACT
  Filled 2013-11-29 (×24): qty 2

## 2013-11-29 NOTE — Progress Notes (Signed)
Pt currently on 55% VM and tolerating well at this time, Pt in no noted distress, resting comfortably.  BIPAP not needed at this time, RT to monitor and assess as needed.

## 2013-11-29 NOTE — Consult Note (Signed)
PULMONARY / CRITICAL CARE MEDICINE   Name: AMITAI DELAUGHTER MRN: [id number] DOB: April 13, 1933    ADMISSION DATE:  12/07/2013 CONSULTATION DATE:  11/29/2013  REFERRING MD :  Murvin Natal  CHIEF COMPLAINT:  SOB  INITIAL PRESENTATION: 80 y.o. M followed by RB for COPD and [facility]LC s/p resection brought to [facility] ED on 10/17 with SOB.  Was placed on Levaquin roughly 1 week prior for PNA.  Became hypoxic to 84% despite his normal 3L [place].  In ED, was found to have CHF exacerbation and was subsequently admitted.  PCCM was consulted at the request of pt.   STUDIES:  CXR 10/17 >>> COPD with chronic scarring in right base.  Increased interstitial opacities in mid to lower lung zones, possibly from cardiogenic interstitial pulmonary edema. Echo 7/22 >>> EF 40-45%, diffuse hypokinesis, RV overload, mechanical AV, severely dilated LA, mod - severe TR, PAP 72.  SIGNIFICANT EVENTS: 10/17 - admit 10/20 - PCCM consult   HISTORY OF PRESENT ILLNESS: Mr. Miles is a 80 y.o. M with PMH as outlined below which includes COPD and NSCLC s/p resection (followed by RB as outpatient).  He presented to the [facility] ED on 910/17 with SOB that had been going on since a few days prior.  He had been seen in the office 1 week prior and was placed on Levaquin for CAP and possible AECOPD. Per chart review, pt had been having dyspnea for the past 1 month as well as progressive LE edema.  He was also experiencing PND and severe DOE.  At baseline, he is on 3L O2 via [place]; however, when EMS arrived, his SpO2 was 84% which improved to 100% after being placed on a NRB. In ED, lab work revealed elevated BNP and CXR demonstrated interstitial edema.  He was subsequently admitted for acute exacerbation of chronic heart failure.  On 10/20, PCCM was consulted per pt's request.  During my visit, pt denied any chest pain.  He felt that SOB had somewhat improved but was still not back to baseline.  He denies any cough.  Remains on venti mask @ 55%.  PAST  MEDICAL HISTORY :  Past Medical History  Diagnosis Date  . PAF (paroxysmal atrial fibrillation)     a. intolerant to amio (? lung toxicity) and tikosyn (prolonged QT);  b. 07/2012 s/p DCCV;  c. 09/2012 s/p DCCV - Multaq initiated.  . Aortic valve disorders     a. s/p AVR, 2006;  b. chronic coumadin.  . Other primary cardiomyopathies   . Edema     CHRONIC LOWER EXTREMITY  . Unspecified essential hypertension   . Other and unspecified hyperlipidemia   . Heart valve replaced by other means   . Other malaise and fatigue   . Coronary atherosclerosis of native coronary artery   . Acute myocardial infarction, subendocardial infarction, subsequent episode of care   . Unspecified adverse effect of unspecified drug, medicinal and biological substance   . Umbilical hernia without mention of obstruction or gangrene   . Sebaceous cyst   . Labyrinthitis, unspecified   . Personal history of other diseases of digestive system   . Special screening for malignant neoplasm of prostate   . BC (bronchogenic carcinoma)     S/P LOBECTOMY 2011  . Shortness of breath   . COPD (chronic obstructive pulmonary disease)   . GERD (gastroesophageal reflux disease)    Past Surgical History  Procedure Laterality Date  . Aortic valve replacement  1994    [facility]  .  Appendectomy  [facility]  . Abdominal hernia repair    . Lobectomy  2011  . Cardioversion N/A 08/03/2012    Procedure: CARDIOVERSION;  Surgeon: Sherren Mocha, MD;  Location: [place];  Service: Cardiovascular;  Laterality: N/A;  . Eye surgery      retinal repair  . Cardioversion N/A 09/20/2012    Procedure: CARDIOVERSION;  Surgeon: Evans Lance, MD;  Location: [place];  Service: Cardiovascular;  Laterality: N/A;   Prior to Admission medications   Medication Sig Start Date End Date Taking? Authorizing Provider  acetaminophen (TYLENOL) 500 MG tablet Take 1,000 mg by mouth every 6 (six) hours as needed for pain.    Yes Historical Provider, MD   albuterol (PROVENTIL HFA;VENTOLIN HFA) 108 (90 BASE) MCG/ACT inhaler Inhale 2 puffs into the lungs every 4 (four) hours as needed for wheezing or shortness of breath.   Yes Historical Provider, MD  allopurinol (ZYLOPRIM) 100 MG tablet Take 100 mg by mouth every morning.   Yes Historical Provider, MD  aspirin EC 81 MG tablet Take 81 mg by mouth every morning.   Yes Historical Provider, MD  diltiazem (CARDIZEM CD) 120 MG 24 hr capsule Take 1 capsule (120 mg total) by mouth 2 (two) times daily. 08/19/13  Yes Sherren Mocha, MD  famotidine (PEPCID) 20 MG tablet Take 20 mg by mouth daily as needed for heartburn.    Yes Historical Provider, MD  furosemide (LASIX) 40 MG tablet Take 2 tablets (80 mg total) by mouth 2 (two) times daily. 05/20/13  Yes Sherren Mocha, MD  guaiFENesin (MUCINEX) 600 MG 12 hr tablet Take 600 mg by mouth 2 (two) times daily.   Yes Historical Provider, MD  levofloxacin (LEVAQUIN) 500 MG tablet Take 500 mg by mouth daily. For 7 days. Starting 11/21/2013. Last dose is 11/28/2013.   Yes Historical Provider, MD  metoprolol (LOPRESSOR) 100 MG tablet Take 0.5 tablets (50 mg total) by mouth 2 (two) times daily. 04/12/13  Yes Burtis Junes, NP  NON FORMULARY Place 2-4 L into the nose continuous.    Yes Historical Provider, MD  potassium chloride SA (K-DUR,KLOR-CON) 20 MEQ tablet Take 1 tablet (20 mEq total) by mouth 2 (two) times daily. 11/23/13  Yes Burtis Junes, NP  warfarin (COUMADIN) 2.5 MG tablet Take 2.5 mg by mouth 4 (four) times a week. Tues. Thurs. Sat. Sun.   Yes Historical Provider, MD  warfarin (COUMADIN) 5 MG tablet Take 5 mg by mouth 3 (three) times a week. Mon Wed Fri   Yes Historical Provider, MD   Allergies  Allergen Reactions  . Penicillins Hives, Swelling and Rash     facial swelling    FAMILY HISTORY:  Family History  Problem Relation Age of Onset  . Colon cancer Father   . Aneurysm Mother     RUPTURE-NECK   . Coronary artery disease Mother    SOCIAL  HISTORY:  reports that he quit smoking about 4 years ago. His smoking use included Cigarettes. He has a 40 pack-year smoking history. He has never used smokeless tobacco. He reports that he drinks alcohol. He reports that he does not use illicit drugs.  REVIEW OF SYSTEMS:   All negative; except for those that are bolded, which indicate positives.  Constitutional: weight loss, weight gain, night sweats, fevers, chills, fatigue, weakness.  HEENT: headaches, sore throat, sneezing, nasal congestion, post nasal drip, difficulty swallowing, tooth/dental problems, visual complaints, visual changes, ear aches. Neuro: difficulty with speech, weakness, numbness, ataxia. CV:  chest pain, orthopnea, PND, swelling in lower extremities, dizziness, palpitations, syncope.  Resp: cough, hemoptysis, dyspnea, wheezing. GI  heartburn, indigestion, abdominal pain, nausea, vomiting, diarrhea, constipation, change in bowel habits, loss of appetite, hematemesis, melena, hematochezia.  GU: dysuria, change in color of urine, urgency or frequency, flank pain, hematuria. MSK: joint pain or swelling, decreased range of motion. Psych: change in mood or affect, depression, anxiety, suicidal ideations, homicidal ideations. Skin: rash, itching, bruising.   SUBJECTIVE:   VITAL SIGNS: Temp:  [97.7 F (36.5 C)-99.3 F (37.4 C)] 97.7 F (36.5 C) (10/20 1200) Pulse Rate:  [56-115] 82 (10/20 1200) Resp:  [25-42] 42 (10/20 1200) BP: (87-138)/(45-102) 91/54 mmHg (10/20 1200) SpO2:  [87 %-100 %] 89 % (10/20 1200) FiO2 (%):  [50 %-55 %] 55 % (10/20 1200) Weight:  [75.8 kg (167 lb 1.7 oz)] 75.8 kg (167 lb 1.7 oz) (10/20 0338) HEMODYNAMICS:   VENTILATOR SETTINGS: Vent Mode:  [-]  FiO2 (%):  [50 %-55 %] 55 % INTAKE / OUTPUT: Intake/Output     10/19 0701 - 10/20 0700 10/20 0701 - 10/21 0700   P.O. 540 320   I.V. (mL/kg)  3 (0)   Total Intake(mL/kg) 540 (7.1) 323 (4.3)   Urine (mL/kg/hr) 1075 (0.6) 200 (0.4)   Total  Output 1075 200   Net -535 +123          PHYSICAL EXAMINATION: General: Elderly, chronically ill appearing male. Neuro: A&O x 3, non-focal.  HEENT: [place]/AT. PERRL, sclerae anicteric. Cardiovascular: IRIR, no M/R/G, mechanical AV click/snap clearly audible. Lungs: Respirations shallow and rapid.  Crackles at bases, otherwise clear.  Some occasional paradoxical respirations. Abdomen: BS x 4, soft, NT/ND.  Musculoskeletal: No gross deformities, 2+ edema.  Skin: Intact, warm, no rashes.  LABS:  CBC  Recent Labs Lab 11/22/2013 1622  WBC 10.6*  HGB 12.3*  HCT 39.4  PLT 260   Coag's  Recent Labs Lab 11/27/13 0303 11/28/13 0321 11/29/13 0339  INR 2.65* 2.50* 2.82*   BMET  Recent Labs Lab 11/27/13 0303 11/28/13 0321 11/29/13 0339  NA 140 137 138  K 4.6 3.9 4.0  CL 101 97 96  CO2 29 30 30   BUN 19 20 24*  CREATININE 1.13 1.06 1.03  GLUCOSE 92 106* 98   Electrolytes  Recent Labs Lab 11/27/13 0303 11/28/13 0321 11/29/13 0339  CALCIUM 9.0 9.4 9.3   Sepsis Markers No results found for this basename: LATICACIDVEN, PROCALCITON, O2SATVEN,  in the last 168 hours ABG No results found for this basename: PHART, PCO2ART, PO2ART,  in the last 168 hours Liver Enzymes No results found for this basename: AST, ALT, ALKPHOS, BILITOT, ALBUMIN,  in the last 168 hours Cardiac Enzymes  Recent Labs Lab 11/27/2013 1622  PROBNP 2357.0*   Glucose No results found for this basename: GLUCAP,  in the last 168 hours  Imaging No results found.   ASSESSMENT / PLAN:  PULMONARY A: AECOPD - just completed recent 7 day course levaquin. NSCLC s/p resection Pulmonary edema P:   Place on BiPAP now. When off BiPAP then continue supplemental O2 as needed to maintain Spo2 > 92%. Continue CPAP at bedtime. Continue scheduled DuoNebs, frequency adjusted. Add PRN albuterol. Hold outpatient symbicort, spiriva for now (question whether he is currently able to effectively use  inhalers). Switch to budesonide / brovana while hospitalized. Aggressive diuresis as BP / SCr permit. CXR in AM. Follow up with pulmonology after discharge.  CARDIOVASCULAR A:  Acute on chronic systolic heart failure A.fib rate controlled  Mechanical aortic valve P:  Cardiology following / managing. Continue coumadin. Aggressive diuresis as BP / SCr permit.  RENAL A:   No acute issues P:   NS @ KVO. BMP in AM.  GASTROINTESTINAL A:   GI prophylaxis Nutrition P:   Heart healthy diet.  HEMATOLOGIC A:   VTE Prophylaxis Chronic anticoagulation with coumadin for mechanical AVR and A.fib P:  SCD's. Goal INR 2.5 - 3.5. CBC in AM.  INFECTIOUS A:   No evidence of infection P:   Monitor clinically.  ENDOCRINE A:   No acute issues P:   Monitor glucose on BMP.  NEUROLOGIC A:   Deconditioning Anxiety P:   OOB with nurse assist. Consider PT eval and treat. Caution with Xanax and over sedation as it relates to respiratory status.  Family updated: None available.  Interdisciplinary Family Meeting v Palliative Care Meeting:  Pt has significant co-morbidities that certainly put him at risk for poor prognosis.  Therefore, if no improvement with current therapies, may need to try and get palliative care involved.  14 SUMMARY: 80 y.o. M followed by RB for COPD, admitted for AoC CHF.  Continued attempts to diuresis.  Have started him on BiPAP as he had increased WOB.  If no improvement over next few days, would consider getting palliative care involved.   Montey Hora, [facility] Pgr: [phone]  or [phone] 11/29/2013, 2:22 PM Attending:  I have seen and examined the patient with nurse practitioner/resident and agree with the note above.   Mr. Marmo's exam has improved on BIPAP, few crackles left base, no wheezing noted Agree to continue treating as CHF for now, we added BIPAP and changed nebs to  brovana and pulmicort  I have personally obtained a history, examined the patient, evaluated laboratory and imaging results, formulated the assessment and plan and placed orders. CRITICAL CARE: The patient is critically ill with multiple organ systems failure and requires high complexity decision making for assessment and support, frequent evaluation and titration of therapies, application of advanced monitoring technologies and extensive interpretation of multiple databases. Critical Care Time devoted to patient care services described in this note is 40 minutes.   Roselie Awkward, MD [place] [facility] Pager: [phone] Cell: [phone] If no response, call [phone]

## 2013-11-29 NOTE — Progress Notes (Signed)
Pt taken off CPAP and placed on 55% VM per Pt request.  Pt currently tolerating well at this time, RT to monitor and assess as needed.

## 2013-11-29 NOTE — Progress Notes (Signed)
ANTICOAGULATION CONSULT NOTE - Follow-Up Consult  Pharmacy Consult for Warfarin Indication: atrial fibrillation  Allergies  Allergen Reactions  . Penicillins Hives, Swelling and Rash     facial swelling    Patient Measurements: Height: 5\' 11"  (180.3 cm) Weight: 167 lb 1.7 oz (75.8 kg) IBW/kg (Calculated) : 75.3  Vital Signs: Temp: 98.9 F (37.2 C) (10/20 0739) Temp Source: Oral (10/20 0739) BP: 137/82 mmHg (10/20 0700) Pulse Rate: 59 (10/20 0739)  Labs:  Recent Labs  12/05/2013 1622  11/27/13 0303 11/28/13 0321 11/29/13 0339  HGB 12.3*  --   --   --   --   HCT 39.4  --   --   --   --   PLT 260  --   --   --   --   LABPROT  --   < > 28.5* 27.2* 29.9*  INR  --   < > 2.65* 2.50* 2.82*  CREATININE 1.12  --  1.13 1.06 1.03  < > = values in this interval not displayed.  Estimated Creatinine Clearance: 63 ml/min (by C-G formula based on Cr of 1.03).   Medical History: Past Medical History  Diagnosis Date  . PAF (paroxysmal atrial fibrillation)     a. intolerant to amio (? lung toxicity) and tikosyn (prolonged QT);  b. 07/2012 s/p DCCV;  c. 09/2012 s/p DCCV - Multaq initiated.  . Aortic valve disorders     a. s/p AVR, 2006;  b. chronic coumadin.  . Other primary cardiomyopathies   . Edema     CHRONIC LOWER EXTREMITY  . Unspecified essential hypertension   . Other and unspecified hyperlipidemia   . Heart valve replaced by other means   . Other malaise and fatigue   . Coronary atherosclerosis of native coronary artery   . Acute myocardial infarction, subendocardial infarction, subsequent episode of care   . Unspecified adverse effect of unspecified drug, medicinal and biological substance   . Umbilical hernia without mention of obstruction or gangrene   . Sebaceous cyst   . Labyrinthitis, unspecified   . Personal history of other diseases of digestive system   . Special screening for malignant neoplasm of prostate   . BC (bronchogenic carcinoma)     S/P LOBECTOMY  2011  . Shortness of breath   . COPD (chronic obstructive pulmonary disease)   . GERD (gastroesophageal reflux disease)     Assessment: 42 YOM admitted on 11/16/2013 with SOB upon waking. Pharmacy consulted to dose warfarin for Afib and [facility] AVR. INR remains therapeutic today at 2.82 on home dose.   PTA dose of 2.5 mg daily EXCEPT for 5 mg on MWF  Goal of Therapy:  INR 2.5-3.5   Plan:  1. Warfarin 2.5 mg x 1 dose tonight (c/w home dose) 2. Daily PT/INR 3. Will continue to monitor for any signs/symptoms of bleeding and will follow up with PT/INR in the a.m.   Thank you for allowing pharmacy to be a part of this patients care team. Harolyn Rutherford, PharmD Clinical Pharmacist - Resident Pager: [phone] Pharmacy: [phone] 11/29/2013 9:24 AM

## 2013-11-29 NOTE — Care Management Note (Addendum)
    Page 1 of 2   12/08/2013     9:23:12 AM CARE MANAGEMENT NOTE 12/08/2013  Patient:  John [name], John [name]   Account Number:  [id number]  Date Initiated:  11/29/2013  Documentation initiated by:  Elissa Hefty  Subjective/Objective Assessment:   adm w chf     Action/Plan:   lives w wife, pcp dr Tenny Craw   Anticipated DC Date:     Anticipated DC Plan:    In-house referral  Clinical Social Worker      DC Forensic scientist  CM consult      PAC Choice  [place]   Choice offered to / List presented to:  C-1 Patient   DME arranged  [facility]      DME agency  [place] arranged  HH-1 RN  HH-10 DISEASE MANAGEMENT      [place] agency  [facility] [place]   Status of service:   Medicare Important Message given?  YES (If response is "NO", the following Medicare IM given date fields will be blank) Date Medicare IM given:  12/05/2013 Medicare IM given by:  Elissa Hefty Date Additional Medicare IM given:  12/08/2013 Additional Medicare IM given by:  [facility]  Discharge Disposition:    Per UR Regulation:    If discussed at Long Length of Stay Meetings, dates discussed:   12/01/2013  12/06/2013  12/08/2013    Comments:  10/28  1416 debbie Ellysia Char rn,bsn spoke w dr Nelda Marseille who feels hospice may be best option. spoke w wife and they are interested in hospice and possible hospice medical facility. made sw ref. wife would like [facility] [place] and poss [facility]. ref to [facility] w [facility] [place] and sw to ck on avail at [facility].   10/28  0927 debbie Woodard Perrell rn,bsn spoke w pt and wife. he has home o2 w [facility]. he has hx of hhc w adv and may need hhrn again w ahc vs hospice. await md orders. [facility]c dme state most concentrators go to 5lit but they have concentrator that will go to 10 lit. await md final orders for o2 also.  10/20 1056a debbie Melanni Benway rn,bsn hx of hhc w [facility]  homecare. will follow for poss hhc needs this hosp.

## 2013-11-29 NOTE — Progress Notes (Signed)
Patient Name: John [name] Date of Encounter: 11/29/2013     Active Problems:   CHF (congestive heart failure)   Heart failure    SUBJECTIVE  Patient is less dyspneic this am. Good diuresis on IV lasix. Heart rate controlled on diltiazem and metoprolol. On levoquin for recent cellulitis treatment. Peripheral edema much improved according to wife.   He is still short of breath.  CURRENT MEDS . allopurinol  100 mg Oral BH-[id number]  . antiseptic oral rinse  7 mL Mouth Rinse BID  . aspirin EC  81 mg Oral Daily  . budesonide-formoterol  2 puff Inhalation BID  . diltiazem  120 mg Oral BID  . feeding supplement (ENSURE COMPLETE)  237 mL Oral BID BM  . furosemide  80 mg Intravenous BID  . guaiFENesin  600 mg Oral BID  . Influenza vac split quadrivalent PF  0.5 mL Intramuscular Tomorrow-1000  . lisinopril  2.5 mg Oral Daily  . metoprolol  50 mg Oral BID  . sodium chloride  3 mL Intravenous Q12H  . tiotropium  18 mcg Inhalation Daily  . Warfarin - Pharmacist Dosing Inpatient   Does not apply q1800    OBJECTIVE  Filed Vitals:   11/29/13 0600 11/29/13 0700 11/29/13 0739 11/29/13 0904  BP: 106/58 137/82    Pulse: 74 82 59   Temp:   98.9 F (37.2 C)   TempSrc:   Oral   Resp: 25 29 26    Height:      Weight:      SpO2: 94% 94% 94% 97%    Intake/Output Summary (Last 24 hours) at 11/29/13 0916 Last data filed at 11/29/13 0700  Gross per 24 hour  Intake    540 ml  Output    975 ml  Net   -435 ml   Filed Weights   11/27/13 0500 11/28/13 0422 11/29/13 0338  Weight: 168 lb 10.4 oz (76.5 kg) 167 lb 15.9 oz (76.2 kg) 167 lb 1.7 oz (75.8 kg)    PHYSICAL EXAM  General: Pleasant, NAD. Nasal oxygen in place. Neuro: Alert and oriented X 3. Moves all extremities spontaneously. Psych: Normal affect. HEENT:  Normal  Neck: Supple without bruits or JVD. Lungs: Bilateral wheezing  Heart: irreg.  no s3, s4, or murmurs. Good mechanical aortic valve click Abdomen: Soft, non-tender,  non-distended, BS + x 4.  Extremities: No clubbing, cyanosis. Mild edema. DP/PT/Radials 2+ and equal bilaterally.  Accessory Clinical Findings  CBC  Recent Labs  11/27/2013 1622  WBC 10.6*  HGB 12.3*  HCT 39.4  MCV 94.0  PLT 213   Basic Metabolic Panel  Recent Labs  11/28/13 0321 11/29/13 0339  NA 137 138  K 3.9 4.0  CL 97 96  CO2 30 30  GLUCOSE 106* 98  BUN 20 24*  CREATININE 1.06 1.03  CALCIUM 9.4 9.3   Liver Function Tests No results found for this basename: AST, ALT, ALKPHOS, BILITOT, PROT, ALBUMIN,  in the last 72 hours No results found for this basename: LIPASE, AMYLASE,  in the last 72 hours Cardiac Enzymes No results found for this basename: CKTOTAL, CKMB, CKMBINDEX, TROPONINI,  in the last 72 hours BNP No components found with this basename: POCBNP,  D-Dimer No results found for this basename: DDIMER,  in the last 72 hours Hemoglobin A1C No results found for this basename: HGBA1C,  in the last 72 hours Fasting Lipid Panel No results found for this basename: CHOL, HDL, LDLCALC, TRIG, CHOLHDL, LDLDIRECT,  in  the last 72 hours Thyroid Function Tests No results found for this basename: TSH, T4TOTAL, FREET3, T3FREE, THYROIDAB,  in the last 72 hours  TELE  Atrial fibrillation with controlled VR  ECG  Atrial fibrillation ST & T wave abnormality, consider lateral ischemia Prolonged QT Abnormal ECG  Radiology/Studies  Dg Chest Portable 1 View  11/11/2013   CLINICAL DATA:  Initial evaluation for shortness of breath 1 week, worse today, current history of COPD  EXAM: PORTABLE CHEST - 1 VIEW  COMPARISON:  08/02/2013  FINDINGS: Moderately severe cardiac enlargement stable from prior study. Mild vascular congestion. Mild diffuse interstitial prominence. There is elevation of the right diaphragm with right base opacity similar to prior study suggesting scarring or atelectasis. There is emphysematous change in the upper lung zones.  IMPRESSION: COPD with chronic  scarring in the right base and chronic cardiac enlargement. However, increased interstitial opacities in the mid to lower lung zones currently suggest the possibility of mild cardiogenic interstitial pulmonary edema.   Electronically Signed   By: Skipper Cliche M.D.   On: 11/15/2013 17:08    ASSESSMENT AND PLAN 1. 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   cont home BB.  - pt is not on after load reducing agents.  On  lisinopril 2.5mg  po qd. Titrate as tolerated by BP.  - strict in and outs  - daily weights   2.  Chronic lung disease / chronic respiratory failure   - optimize pulm tx - start on spiriva and symbicort.  - nebs prn. No need for high dose steroid at this time.  - cont 3L oxygen per home regiment , he is requiring more O2 at present - 14 liters at present   3. S/p aortic valve replacement - with mechanical valve  - INR goal 2.5-3.5  - Pharmacy consult placed for continuation of coumadin   4.  Chronic AF  - rate controlled  - cont BB and CCB at this time.  - cont [place] as above  5. Mobility:  Will have the nurse get him up and will advance tomorrow.   6.  Severe malnutrition:  Has been assessed by nutritionist.   Getting ensure.   Thayer Headings, Brooke Bonito., MD, [facility] 11/29/2013, 9:16 AM [address]  [place] Pager [phone]

## 2013-11-30 ENCOUNTER — Inpatient Hospital Stay (HOSPITAL_COMMUNITY): Payer: Medicare Other

## 2013-11-30 DIAGNOSIS — J9601 Acute respiratory failure with hypoxia: Secondary | ICD-10-CM

## 2013-11-30 DIAGNOSIS — J441 Chronic obstructive pulmonary disease with (acute) exacerbation: Principal | ICD-10-CM

## 2013-11-30 LAB — CBC
HCT: 36.1 % — ABNORMAL LOW (ref 39.0–52.0)
Hemoglobin: 11.4 g/dL — ABNORMAL LOW (ref 13.0–17.0)
MCH: 30.5 pg (ref 26.0–34.0)
MCHC: 31.6 g/dL (ref 30.0–36.0)
MCV: 96.5 fL (ref 78.0–100.0)
PLATELETS: 221 10*3/uL (ref 150–400)
RBC: 3.74 MIL/uL — ABNORMAL LOW (ref 4.22–5.81)
RDW: 16.7 % — AB (ref 11.5–15.5)
WBC: 11.2 10*3/uL — ABNORMAL HIGH (ref 4.0–10.5)

## 2013-11-30 LAB — BASIC METABOLIC PANEL
ANION GAP: 10 (ref 5–15)
BUN: 27 mg/dL — ABNORMAL HIGH (ref 6–23)
CO2: 31 mEq/L (ref 19–32)
Calcium: 9 mg/dL (ref 8.4–10.5)
Chloride: 99 mEq/L (ref 96–112)
Creatinine, Ser: 1.02 mg/dL (ref 0.50–1.35)
GFR calc Af Amer: 79 mL/min — ABNORMAL LOW (ref >90)
GFR calc non Af Amer: 68 mL/min — ABNORMAL LOW (ref >90)
GLUCOSE: 108 mg/dL — AB (ref 70–99)
POTASSIUM: 3.9 meq/L (ref 3.7–5.3)
SODIUM: 140 meq/L (ref 137–147)

## 2013-11-30 LAB — PROTIME-INR
INR: 3.04 — AB (ref 0.00–1.49)
PROTHROMBIN TIME: 31.7 s — AB (ref 11.6–15.2)

## 2013-11-30 MED ORDER — WARFARIN SODIUM 5 MG PO TABS
5.0000 mg | ORAL_TABLET | Freq: Once | ORAL | Status: AC
  Administered 2013-11-30: 5 mg via ORAL
  Filled 2013-11-30: qty 1

## 2013-11-30 MED ORDER — LEVALBUTEROL HCL 0.63 MG/3ML IN NEBU
INHALATION_SOLUTION | RESPIRATORY_TRACT | Status: AC
  Administered 2013-11-30: 0.63 mg
  Filled 2013-11-30: qty 3

## 2013-11-30 MED ORDER — LEVALBUTEROL HCL 0.63 MG/3ML IN NEBU
0.6300 mg | INHALATION_SOLUTION | Freq: Four times a day (QID) | RESPIRATORY_TRACT | Status: DC
  Administered 2013-11-30 – 2013-12-09 (×34): 0.63 mg via RESPIRATORY_TRACT
  Filled 2013-11-30 (×58): qty 3

## 2013-11-30 NOTE — Progress Notes (Signed)
Patient Name: John [name] Date of Encounter: 11/30/2013     Principal Problem:   CHF (congestive heart failure) Active Problems:   Heart failure   Acute respiratory failure with hypoxia   Other emphysema    SUBJECTIVE  Patient is less dyspneic this am. Good diuresis on IV lasix. Heart rate controlled on diltiazem and metoprolol. On levoquin for recent cellulitis treatment. Peripheral edema much improved according to wife.   He is still short of breath.  Still diuresing - slowly.  Limited by low BP   CURRENT MEDS . allopurinol  100 mg Oral [id number]  . antiseptic oral rinse  7 mL Mouth Rinse BID  . arformoterol  15 mcg Nebulization BID  . aspirin EC  81 mg Oral Daily  . budesonide  0.5 mg Nebulization BID  . colchicine  0.6 mg Oral Daily  . diltiazem  120 mg Oral BID  . feeding supplement (ENSURE COMPLETE)  237 mL Oral BID BM  . furosemide  80 mg Intravenous BID  . guaiFENesin  600 mg Oral BID  . ipratropium-albuterol  3 mL Nebulization Q6H  . lisinopril  2.5 mg Oral Daily  . metoprolol  50 mg Oral BID  . sodium chloride  3 mL Intravenous Q12H  . Warfarin - Pharmacist Dosing Inpatient   Does not apply q1800    OBJECTIVE  Filed Vitals:   11/30/13 0400 11/30/13 0800 11/30/13 0801 11/30/13 0920  BP: 118/74 102/73 102/73   Pulse: 91 97 101   Temp:   98.4 F (36.9 C)   TempSrc:   Axillary   Resp: 30  26   Height:      Weight:      SpO2: 99% 99% 94% 12%    Intake/Output Summary (Last 24 hours) at 11/30/13 1008 Last data filed at 11/30/13 0653  Gross per 24 hour  Intake    240 ml  Output   1125 ml  Net   -885 ml   Filed Weights   11/28/13 0422 11/29/13 0338 11/30/13 0313  Weight: 167 lb 15.9 oz (76.2 kg) 167 lb 1.7 oz (75.8 kg) 167 lb 8.8 oz (76 kg)    PHYSICAL EXAM  General: Pleasant, NAD. Nasal oxygen in place. Neuro: Alert and oriented X 3. Moves all extremities spontaneously. Psych: Normal affect. HEENT:  Normal  Neck: Supple without bruits or  JVD. Lungs: Bilateral wheezing  Heart: irreg.  no s3, s4, or murmurs. Good mechanical aortic valve click Abdomen: Soft, non-tender, non-distended, BS + x 4.  Extremities: No clubbing, cyanosis. Mild edema. DP/PT/Radials 2+ and equal bilaterally.  Accessory Clinical Findings  CBC  Recent Labs  11/30/13 0225  WBC 11.2*  HGB 11.4*  HCT 36.1*  MCV 96.5  PLT 481   Basic Metabolic Panel  Recent Labs  11/29/13 0339 11/30/13 0225  NA 138 140  K 4.0 3.9  CL 96 99  CO2 30 31  GLUCOSE 98 108*  BUN 24* 27*  CREATININE 1.03 1.02  CALCIUM 9.3 9.0   Liver Function Tests No results found for this basename: AST, ALT, ALKPHOS, BILITOT, PROT, ALBUMIN,  in the last 72 hours No results found for this basename: LIPASE, AMYLASE,  in the last 72 hours Cardiac Enzymes No results found for this basename: CKTOTAL, CKMB, CKMBINDEX, TROPONINI,  in the last 72 hours BNP No components found with this basename: POCBNP,  D-Dimer No results found for this basename: DDIMER,  in the last 72 hours Hemoglobin A1C No results found  for this basename: HGBA1C,  in the last 72 hours Fasting Lipid Panel No results found for this basename: CHOL, HDL, LDLCALC, TRIG, CHOLHDL, LDLDIRECT,  in the last 72 hours Thyroid Function Tests No results found for this basename: TSH, T4TOTAL, FREET3, T3FREE, THYROIDAB,  in the last 72 hours  TELE  Atrial fibrillation with controlled VR  ECG  Atrial fibrillation ST & T wave abnormality, consider lateral ischemia Prolonged QT Abnormal ECG  Radiology/Studies  Dg Chest Portable 1 View  11/24/2013   CLINICAL DATA:  Initial evaluation for shortness of breath 1 week, worse today, current history of COPD  EXAM: PORTABLE CHEST - 1 VIEW  COMPARISON:  08/02/2013  FINDINGS: Moderately severe cardiac enlargement stable from prior study. Mild vascular congestion. Mild diffuse interstitial prominence. There is elevation of the right diaphragm with right base opacity similar  to prior study suggesting scarring or atelectasis. There is emphysematous change in the upper lung zones.  IMPRESSION: COPD with chronic scarring in the right base and chronic cardiac enlargement. However, increased interstitial opacities in the mid to lower lung zones currently suggest the possibility of mild cardiogenic interstitial pulmonary edema.   Electronically Signed   By: Skipper Cliche M.D.   On: 12/08/2013 17:08    ASSESSMENT AND PLAN 1. 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   cont home BB.  - pt is not on after load reducing agents.  On  lisinopril 2.5mg  po qd. Titrate as tolerated by BP.  - strict in and outs  - daily weights   2.  Chronic lung disease / chronic respiratory failure   - optimize pulm tx - start on spiriva and symbicort.  - nebs prn. No need for high dose steroid at this time.  - cont 3L oxygen per home regiment , he is requiring more O2 at present - 14 liters at present   3. S/p aortic valve replacement - with mechanical valve  - INR goal 2.5-3.5  - Pharmacy consult placed for continuation of coumadin   4.  Chronic AF  - rate controlled  - cont BB and CCB at this time.  - cont [place] as above  5. Mobility:  Will have the nurse get him up and will advance tomorrow.   6.  Severe malnutrition:  Has been assessed by nutritionist.   Getting ensure.   His prognosis is poor. We may need to consult Hospice and Palliative care   Ramond Dial., MD, [facility] 11/30/2013, 10:08 AM [address],  [place] Pager [phone]

## 2013-11-30 NOTE — Progress Notes (Signed)
PULMONARY / CRITICAL CARE MEDICINE   Name: John [name] MRN: [id number] DOB: [birth year]    ADMISSION DATE:  11/15/2013 CONSULTATION DATE:  11/30/2013  REFERRING MD :  Murvin Natal  CHIEF COMPLAINT:  SOB  INITIAL PRESENTATION: 80 y.o. M followed by RB for COPD and [facility] s/p resection brought to [facility] ED on 10/17 with SOB.  Was placed on Levaquin roughly 1 week prior for PNA.  Became hypoxic to 84% despite his normal 3L [place].  In ED, was found to have CHF exacerbation and was subsequently admitted.  PCCM was consulted at the request of pt.   STUDIES:  CXR 10/17 >>> COPD with chronic scarring in right base.  Increased interstitial opacities in mid to lower lung zones, possibly from cardiogenic interstitial pulmonary edema. Echo 7/22 >>> EF 40-45%, diffuse hypokinesis, RV overload, mechanical AV, severely dilated LA, mod - severe TR, PAP 72.  SIGNIFICANT EVENTS: 10/17 - admit 10/20 - PCCM consult   SUBJECTIVE: feels terrible today, upset that he was on CPAP all night last night  VITAL SIGNS: Temp:  [97.5 F (36.4 C)-98.4 F (36.9 C)] 98.2 F (36.8 C) (10/21 1145) Pulse Rate:  [69-101] 85 (10/21 1145) Resp:  [14-36] 14 (10/21 1145) BP: (79-118)/(51-74) 101/65 mmHg (10/21 1145) SpO2:  [12 %-100 %] 94 % (10/21 1145) FiO2 (%):  [50 %-55 %] 50 % (10/21 0920) Weight:  [76 kg (167 lb 8.8 oz)] 76 kg (167 lb 8.8 oz) (10/21 0313) HEMODYNAMICS:   VENTILATOR SETTINGS: Vent Mode:  [-]  FiO2 (%):  [50 %-55 %] 50 % INTAKE / OUTPUT: Intake/Output     10/20 0701 - 10/21 0700 10/21 0701 - 10/22 0700   P.O. 560    I.V. (mL/kg) 3 (0)    Total Intake(mL/kg) 563 (7.4)    Urine (mL/kg/hr) 1125 (0.6)    Total Output 1125     Net -562            PHYSICAL EXAMINATION: General: chronically ill appearing,no acute distress HEENT: NCAT, EOMi PULM; Crackles bilateral basis, wheezes upper lobes CV: Irreg irreg, systolic murmur AB: BS+, soft, nontender Ext: warm trace edema Neuro: A&Ox4,  maeaw  LABS:  CBC  Recent Labs Lab 11/16/2013 1622 11/30/13 0225  WBC 10.6* 11.2*  HGB 12.3* 11.4*  HCT 39.4 36.1*  PLT 260 221   Coag's  Recent Labs Lab 11/28/13 0321 11/29/13 0339 11/30/13 0225  INR 2.50* 2.82* 3.04*   BMET  Recent Labs Lab 11/28/13 0321 11/29/13 0339 11/30/13 0225  NA 137 138 140  K 3.9 4.0 3.9  CL 97 96 99  CO2 30 30 31   BUN 20 24* 27*  CREATININE 1.06 1.03 1.02  GLUCOSE 106* 98 108*   Electrolytes  Recent Labs Lab 11/28/13 0321 11/29/13 0339 11/30/13 0225  CALCIUM 9.4 9.3 9.0   Sepsis Markers No results found for this basename: LATICACIDVEN, PROCALCITON, O2SATVEN,  in the last 168 hours ABG No results found for this basename: PHART, PCO2ART, PO2ART,  in the last 168 hours Liver Enzymes No results found for this basename: AST, ALT, ALKPHOS, BILITOT, ALBUMIN,  in the last 168 hours Cardiac Enzymes  Recent Labs Lab 11/19/2013 1622  PROBNP 2357.0*   Glucose No results found for this basename: GLUCAP,  in the last 168 hours  Imaging Dg Chest Port 1 View  11/30/2013   CLINICAL DATA:  Dyspnea, cough, personal history of unspecified essential hypertension, coronary artery disease post MI, COPD, AVR, non-small-cell lung cancer  EXAM: PORTABLE CHEST -  1 VIEW  COMPARISON:  Portable exam 0442 hr compared to 11/12/2013  FINDINGS: Enlargement of cardiac silhouette post median sternotomy.  Atherosclerotic calcification and mild tortuosity of thoracic aorta.  Slight pulmonary vascular congestion.  Interstitial infiltrates at the mid to lower lungs, slightly greater on LEFT, question asymmetric edema versus developing infection.  Infiltrate in LEFT lung has increased since previous exam.  Mild RIGHT basilar atelectasis.  Underlying emphysematous changes.  No gross pleural effusion or pneumothorax.  IMPRESSION: Enlargement of cardiac silhouette with pulmonary vascular congestion.  Question asymmetric edema versus pneumonia with increased LEFT lung  infiltrates since previous exam.  Mild RIGHT basilar atelectasis.   Electronically Signed   By: Lavonia Dana M.D.   On: 11/30/2013 07:45     ASSESSMENT / PLAN:  PULMONARY A: AECOPD - just completed recent 7 day course levaquin NSCLC s/p resection Pulmonary edema P:   BIPAP prn Continue CPAP at bedtime. Continue scheduled DuoNebs, frequency adjusted. Add PRN albuterol. Continue to hold outpatient symbicort, spiriva for now> he is non-compliant with these. Switch to budesonide / brovana while hospitalized. Aggressive diuresis as BP / SCr permit. CXR in AM 10/22 Follow up with pulmonology after discharge. OUT OF BED Incentive spirometry flutter  CARDIOVASCULAR A:  Acute on chronic systolic heart failure A.fib rate controlled Mechanical aortic valve P:  Cardiology following / managing. Continue coumadin. Aggressive diuresis as BP / SCr permit.  INFECTIOUS A:   No evidence of infection but CXR with atlectasis vs infiltrate P:   For now hold antibiotics, but watch for fever, WBC, repeat CXR  ENDOCRINE A:   No acute issues P:   Monitor glucose on BMP.  NEUROLOGIC A:   Deconditioning Anxiety P:   OOB with nurse assist. Consider PT eval and treat. Caution with Xanax and over sedation as it relates to respiratory status.  Family updated: Wife updated at bedside.  Interdisciplinary Family Meeting v Palliative Care Meeting:  Would recommend palliative care consultation, overall prognosis seems poor.Roselie Awkward, MD [place] [facility] Pager: [phone] Cell: [phone] If no response, call [phone]

## 2013-11-30 NOTE — Progress Notes (Signed)
ANTICOAGULATION CONSULT NOTE - Follow-Up Consult  Pharmacy Consult for Warfarin Indication: atrial fibrillation  Allergies  Allergen Reactions  . Penicillins Hives, Swelling and Rash     facial swelling    Patient Measurements: Height: 5\' 11"  (180.3 cm) Weight: 167 lb 8.8 oz (76 kg) IBW/kg (Calculated) : 75.3  Vital Signs: Temp: 98.2 F (36.8 C) (10/21 1145) Temp Source: Oral (10/21 1145) BP: 101/65 mmHg (10/21 1145) Pulse Rate: 85 (10/21 1145)  Labs:  Recent Labs  11/28/13 0321 11/29/13 0339 11/30/13 0225  HGB  --   --  11.4*  HCT  --   --  36.1*  PLT  --   --  221  LABPROT 27.2* 29.9* 31.7*  INR 2.50* 2.82* 3.04*  CREATININE 1.06 1.03 1.02    Estimated Creatinine Clearance: 63.6 ml/min (by C-G formula based on Cr of 1.02).   Medical History: Past Medical History  Diagnosis Date  . PAF (paroxysmal atrial fibrillation)     a. intolerant to amio (? lung toxicity) and tikosyn (prolonged QT);  b. 07/2012 s/p DCCV;  c. 09/2012 s/p DCCV - Multaq initiated.  . Aortic valve disorders     a. s/p AVR, 2006;  b. chronic coumadin.  . Other primary cardiomyopathies   . Edema     CHRONIC LOWER EXTREMITY  . Unspecified essential hypertension   . Other and unspecified hyperlipidemia   . Heart valve replaced by other means   . Other malaise and fatigue   . Coronary atherosclerosis of native coronary artery   . Acute myocardial infarction, subendocardial infarction, subsequent episode of care   . Unspecified adverse effect of unspecified drug, medicinal and biological substance   . Umbilical hernia without mention of obstruction or gangrene   . Sebaceous cyst   . Labyrinthitis, unspecified   . Personal history of other diseases of digestive system   . Special screening for malignant neoplasm of prostate   . BC (bronchogenic carcinoma)     S/P LOBECTOMY 2011  . Shortness of breath   . COPD (chronic obstructive pulmonary disease)   . GERD (gastroesophageal reflux  disease)     Assessment: 86 YOM admitted on 11/11/2013 with SOB upon waking. Pharmacy consulted to dose warfarin for Afib and St. Jude AVR. INR therapeutic at 3.04 but continues to trend up on home dose so will continue to monitor closely. H/H has trended down slightly with no reported s/s bleeding.  PTA dose of 2.5 mg daily EXCEPT for 5 mg on MWF  Goal of Therapy:  INR 2.5-3.5   Plan:  1. Warfarin 5 mg x 1 dose tonight (c/w home dose) 2. Daily PT/INR 3. Will continue to monitor for any signs/symptoms of bleeding and will follow up with PT/INR in the a.m.   Thank you for allowing pharmacy to be a part of this patients care team. Harolyn Rutherford, PharmD Clinical Pharmacist - Resident Pager: [phone] Pharmacy: [phone] 11/30/2013 2:26 PM

## 2013-12-01 ENCOUNTER — Ambulatory Visit: Payer: Medicare Other | Admitting: Cardiovascular Disease

## 2013-12-01 ENCOUNTER — Inpatient Hospital Stay (HOSPITAL_COMMUNITY): Payer: Medicare Other

## 2013-12-01 DIAGNOSIS — E43 Unspecified severe protein-calorie malnutrition: Secondary | ICD-10-CM | POA: Diagnosis present

## 2013-12-01 DIAGNOSIS — Z7901 Long term (current) use of anticoagulants: Secondary | ICD-10-CM

## 2013-12-01 DIAGNOSIS — I10 Essential (primary) hypertension: Secondary | ICD-10-CM

## 2013-12-01 DIAGNOSIS — R29898 Other symptoms and signs involving the musculoskeletal system: Secondary | ICD-10-CM

## 2013-12-01 LAB — LIPID PANEL
CHOLESTEROL: 74 mg/dL (ref 0–200)
HDL: 23 mg/dL — ABNORMAL LOW (ref >39)
LDL Cholesterol: 41 mg/dL (ref 0–99)
TRIGLYCERIDES: 51 mg/dL (ref <150)
Total CHOL/HDL Ratio: 3.2 RATIO
VLDL: 10 mg/dL (ref 0–40)

## 2013-12-01 LAB — BASIC METABOLIC PANEL
ANION GAP: 12 (ref 5–15)
BUN: 29 mg/dL — ABNORMAL HIGH (ref 6–23)
CHLORIDE: 97 meq/L (ref 96–112)
CO2: 30 mEq/L (ref 19–32)
Calcium: 8.9 mg/dL (ref 8.4–10.5)
Creatinine, Ser: 0.91 mg/dL (ref 0.50–1.35)
GFR calc Af Amer: >90 mL/min (ref >90)
GFR calc non Af Amer: 79 mL/min — ABNORMAL LOW (ref >90)
GLUCOSE: 95 mg/dL (ref 70–99)
POTASSIUM: 3.7 meq/L (ref 3.7–5.3)
Sodium: 139 mEq/L (ref 137–147)

## 2013-12-01 LAB — PROTIME-INR
INR: 3.81 — ABNORMAL HIGH (ref 0.00–1.49)
Prothrombin Time: 37.8 seconds — ABNORMAL HIGH (ref 11.6–15.2)

## 2013-12-01 MED ORDER — DEXTROSE 5 % IV SOLN
120.0000 mg | Freq: Three times a day (TID) | INTRAVENOUS | Status: DC
  Administered 2013-12-02: 120 mg via INTRAVENOUS
  Filled 2013-12-01 (×3): qty 12

## 2013-12-01 MED ORDER — TIOTROPIUM BROMIDE MONOHYDRATE 18 MCG IN CAPS
18.0000 ug | ORAL_CAPSULE | Freq: Every day | RESPIRATORY_TRACT | Status: DC
  Administered 2013-12-03 – 2013-12-08 (×5): 18 ug via RESPIRATORY_TRACT
  Filled 2013-12-01: qty 5

## 2013-12-01 MED ORDER — POTASSIUM CHLORIDE CRYS ER 20 MEQ PO TBCR
40.0000 meq | EXTENDED_RELEASE_TABLET | Freq: Once | ORAL | Status: AC
  Administered 2013-12-01: 40 meq via ORAL
  Filled 2013-12-01: qty 2

## 2013-12-01 NOTE — Progress Notes (Signed)
ANTICOAGULATION CONSULT NOTE - Follow-Up Consult  Pharmacy Consult for Warfarin Indication: atrial fibrillation  Allergies  Allergen Reactions  . Penicillins Hives, Swelling and Rash     facial swelling    Patient Measurements: Height: 5\' 11"  (180.3 cm) Weight: 168 lb 6.9 oz (76.4 kg) IBW/kg (Calculated) : 75.3  Vital Signs: Temp: 98.1 F (36.7 C) (10/22 1100) Temp Source: Oral (10/22 1100) BP: 95/67 mmHg (10/22 0800) Pulse Rate: 88 (10/22 1100)  Labs:  Recent Labs  11/29/13 0339 11/30/13 0225 12/01/13 0316 12/01/13 0909  HGB  --  11.4*  --   --   HCT  --  36.1*  --   --   PLT  --  221  --   --   LABPROT 29.9* 31.7* 37.8*  --   INR 2.82* 3.04* 3.81*  --   CREATININE 1.03 1.02  --  0.91    Estimated Creatinine Clearance: 71.3 ml/min (by C-G formula based on Cr of 0.91).   Medical History: Past Medical History  Diagnosis Date  . PAF (paroxysmal atrial fibrillation)     a. intolerant to amio (? lung toxicity) and tikosyn (prolonged QT);  b. 07/2012 s/p DCCV;  c. 09/2012 s/p DCCV - Multaq initiated.  . Aortic valve disorders     a. s/p AVR, 2006;  b. chronic coumadin.  . Other primary cardiomyopathies   . Edema     CHRONIC LOWER EXTREMITY  . Unspecified essential hypertension   . Other and unspecified hyperlipidemia   . Heart valve replaced by other means   . Other malaise and fatigue   . Coronary atherosclerosis of native coronary artery   . Acute myocardial infarction, subendocardial infarction, subsequent episode of care   . Unspecified adverse effect of unspecified drug, medicinal and biological substance   . Umbilical hernia without mention of obstruction or gangrene   . Sebaceous cyst   . Labyrinthitis, unspecified   . Personal history of other diseases of digestive system   . Special screening for malignant neoplasm of prostate   . BC (bronchogenic carcinoma)     S/P LOBECTOMY 2011  . Shortness of breath   . COPD (chronic obstructive pulmonary  disease)   . GERD (gastroesophageal reflux disease)     Assessment: 72 YOM admitted on 11/28/2013 with SOB upon waking. Pharmacy consulted to dose warfarin for Afib and St. Jude AVR. INR has trended up and is now slightly SUPRAtherapeutic at 3.81 on home dose. Of note, patient has not been eating well since admission which could be having an effect on his INR. H/H has trended down slightly but has no reported s/s bleeding.  PTA dose of 2.5 mg daily EXCEPT for 5 mg on MWF  Goal of Therapy:  INR 2.5-3.5   Plan:  1. Hold warfarin tonight 2. Daily PT/INR 3. Will continue to monitor for any signs/symptoms of bleeding and will follow up with PT/INR in the a.m.   Thank you for allowing pharmacy to be a part of this patients care team. Harolyn Rutherford, PharmD Clinical Pharmacist - Resident Pager: [phone] Pharmacy: [phone] 12/01/2013 1:26 PM

## 2013-12-01 NOTE — Progress Notes (Signed)
Pt having some increased sob.  sats dropping to low 80s on nasal canula 6 liters.  Placed pt on venti mask 10L.  sats increased to 90.Marland Kitchen Positioned patient in bed for better respirations. Wife at bedside. Will cont to assess.

## 2013-12-01 NOTE — Progress Notes (Signed)
PULMONARY / CRITICAL CARE MEDICINE   Name: John [name] MRN: [id number] DOB: 12-08-1933    ADMISSION DATE:  11/30/2013 CONSULTATION DATE:  12/01/2013  REFERRING MD :  Murvin Natal  CHIEF COMPLAINT:  SOB  INITIAL PRESENTATION: 80 y.o. M followed by RB for COPD and [facility] s/p resection brought to [facility] ED on 10/17 with SOB.  Was placed on Levaquin roughly 1 week prior for PNA.  Became hypoxic to 84% despite his normal 3L [place].  In ED, was found to have CHF exacerbation and was subsequently admitted.  PCCM was consulted at the request of pt.   STUDIES:  CXR 10/17 >>> COPD with chronic scarring in right base.  Increased interstitial opacities in mid to lower lung zones, possibly from cardiogenic interstitial pulmonary edema. Echo 7/22 >>> EF 40-45%, diffuse hypokinesis, RV overload, mechanical AV, severely dilated LA, mod - severe TR, PAP 72.  SIGNIFICANT EVENTS: 10/17 - admit 10/20 - PCCM consult   SUBJECTIVE: feels terrible today, upset that he was on CPAP all night last night  VITAL SIGNS: Temp:  [97.2 F (36.2 C)-99.4 F (37.4 C)] 97.5 F (36.4 C) (10/22 0745) Pulse Rate:  [61-107] 85 (10/22 0900) Resp:  [14-40] 25 (10/22 0900) BP: (87-103)/(54-67) 95/67 mmHg (10/22 0800) SpO2:  [88 %-98 %] 95 % (10/22 0900) FiO2 (%):  [40 %-55 %] 40 % (10/22 0142) Weight:  [76.4 kg (168 lb 6.9 oz)] 76.4 kg (168 lb 6.9 oz) (10/22 0500) HEMODYNAMICS:   VENTILATOR SETTINGS: Vent Mode:  [-]  FiO2 (%):  [40 %-55 %] 40 % INTAKE / OUTPUT: Intake/Output     10/21 0701 - 10/22 0700 10/22 0701 - 10/23 0700   P.O. 120    I.V. (mL/kg) 3 (0)    Total Intake(mL/kg) 123 (1.6)    Urine (mL/kg/hr) 500 (0.3) 500 (1.6)   Total Output 500 500   Net -377 -500        Stool Occurrence 1 x      PHYSICAL EXAMINATION: General: chronically ill appearing,no acute distress HEENT: NCAT, EOMi PULM; Crackles bilateral basis, wheezes upper lobes CV: Irreg irreg, systolic murmur AB: BS+, soft, nontender Ext: warm  trace edema Neuro: A&Ox4, maeaw  LABS:  CBC  Recent Labs Lab 11/10/2013 1622 11/30/13 0225  WBC 10.6* 11.2*  HGB 12.3* 11.4*  HCT 39.4 36.1*  PLT 260 221   Coag's  Recent Labs Lab 11/29/13 0339 11/30/13 0225 12/01/13 0316  INR 2.82* 3.04* 3.81*   BMET  Recent Labs Lab 11/29/13 0339 11/30/13 0225 12/01/13 0909  NA 138 140 139  K 4.0 3.9 3.7  CL 96 99 97  CO2 30 31 30   BUN 24* 27* 29*  CREATININE 1.03 1.02 0.91  GLUCOSE 98 108* 95   Electrolytes  Recent Labs Lab 11/29/13 0339 11/30/13 0225 12/01/13 0909  CALCIUM 9.3 9.0 8.9   Sepsis Markers No results found for this basename: LATICACIDVEN, PROCALCITON, O2SATVEN,  in the last 168 hours ABG No results found for this basename: PHART, PCO2ART, PO2ART,  in the last 168 hours Liver Enzymes No results found for this basename: AST, ALT, ALKPHOS, BILITOT, ALBUMIN,  in the last 168 hours Cardiac Enzymes  Recent Labs Lab 11/24/2013 1622  PROBNP 2357.0*   Glucose No results found for this basename: GLUCAP,  in the last 168 hours  Imaging Dg Chest Port 1 View  12/01/2013   CLINICAL DATA:  Acute respiratory failure. Hypoxemia. Shortness of breath.  EXAM: PORTABLE CHEST - 1 VIEW  COMPARISON:  11/30/2013  FINDINGS: Moderate enlargement of the cardiopericardial silhouette with mild right hemidiaphragmatic elevation, and evidence of prior open heart surgery and right lower lobectomy.  Worsened interstitial opacity bilaterally, particularly evident in the left lung. Atherosclerotic aortic arch. Severe emphysema.  IMPRESSION: 1. Worsened bilateral interstitial accentuation with underlying cardiomegaly, favoring interstitial pulmonary edema. Atypical pneumonia possible but less likely. 2. Prior right lower lobectomy and prior aortic valve replacement. 3. Severe emphysema.   Electronically Signed   By: Sherryl Barters M.D.   On: 12/01/2013 07:50   Dg Chest Port 1 View  11/30/2013   CLINICAL DATA:  Dyspnea, cough, personal  history of unspecified essential hypertension, coronary artery disease post MI, COPD, AVR, non-small-cell lung cancer  EXAM: PORTABLE CHEST - 1 VIEW  COMPARISON:  Portable exam 0442 hr compared to 11/21/2013  FINDINGS: Enlargement of cardiac silhouette post median sternotomy.  Atherosclerotic calcification and mild tortuosity of thoracic aorta.  Slight pulmonary vascular congestion.  Interstitial infiltrates at the mid to lower lungs, slightly greater on LEFT, question asymmetric edema versus developing infection.  Infiltrate in LEFT lung has increased since previous exam.  Mild RIGHT basilar atelectasis.  Underlying emphysematous changes.  No gross pleural effusion or pneumothorax.  IMPRESSION: Enlargement of cardiac silhouette with pulmonary vascular congestion.  Question asymmetric edema versus pneumonia with increased LEFT lung infiltrates since previous exam.  Mild RIGHT basilar atelectasis.   Electronically Signed   By: Lavonia Dana M.D.   On: 11/30/2013 07:45     ASSESSMENT / PLAN:  PULMONARY A: AECOPD - resolved Acute hypoxemic respiratory failure due to atelectasis and pulm edema NSCLC s/p resection Pulmonary edema P:   Obtain 2 view CXR for better imaging > may need CT chest Restart spiriva Continue CPAP at bedtime. Continue scheduled DuoNebs, frequency adjusted. PRN albuterol. Continue to hold outpatient symbicort for now> he is non-compliant with these. Use budesonide / brovana while hospitalized. Aggressive diuresis as BP / SCr permit. Follow up with pulmonology after discharge. OUT OF BED Incentive spirometry flutter  CARDIOVASCULAR A:  Acute on chronic systolic heart failure A.fib rate controlled Mechanical aortic valve P:  Cardiology following / managing. Continue coumadin. Aggressive diuresis as BP / SCr permit.   NEUROLOGIC A:   Deconditioning Anxiety P:   OOB with nurse assist. PT eval Caution with Xanax and over sedation as it relates to respiratory  status.  Family updated: Wife updated at bedside 10/21  Interdisciplinary Family Meeting v Palliative Care Meeting:  Would recommend palliative care consultation, overall prognosis seems poor.  Roselie Awkward, MD [place] [facility] Pager: [phone] Cell: [phone] If no response, call [phone]

## 2013-12-01 NOTE — Progress Notes (Signed)
Patient Name: John [name] Date of Encounter: 12/01/2013     Principal Problem:   CHF (congestive heart failure) Active Problems:   Heart failure   Acute respiratory failure with hypoxia   Other emphysema    SUBJECTIVE  Patient is less dyspneic this am. Good diuresis on IV lasix. Heart rate controlled on diltiazem and metoprolol. On levoquin for recent cellulitis treatment. Peripheral edema much improved according to wife.   He is still short of breath.  Still diuresing - slowly.  Limited by low BP   CURRENT MEDS . allopurinol  100 mg Oral BH-q7a  . antiseptic oral rinse  7 mL Mouth Rinse BID  . arformoterol  15 mcg Nebulization BID  . aspirin EC  81 mg Oral Daily  . budesonide  0.5 mg Nebulization BID  . colchicine  0.6 mg Oral Daily  . diltiazem  120 mg Oral BID  . feeding supplement (ENSURE COMPLETE)  237 mL Oral BID BM  . furosemide  80 mg Intravenous BID  . guaiFENesin  600 mg Oral BID  . levalbuterol  0.63 mg Nebulization Q6H  . lisinopril  2.5 mg Oral Daily  . metoprolol  50 mg Oral BID  . sodium chloride  3 mL Intravenous Q12H  . Warfarin - Pharmacist Dosing Inpatient   Does not apply q1800    OBJECTIVE  Filed Vitals:   12/01/13 0500 12/01/13 0600 12/01/13 0745 12/01/13 0851  BP:   98/60   Pulse:  61 74   Temp:   97.5 F (36.4 C)   TempSrc:   Oral   Resp:  20 22   Height:      Weight: 168 lb 6.9 oz (76.4 kg)     SpO2:  91% 98% 96%    Intake/Output Summary (Last 24 hours) at 12/01/13 0916 Last data filed at 11/30/13 2000  Gross per 24 hour  Intake    123 ml  Output    250 ml  Net   -127 ml   Filed Weights   11/29/13 0338 11/30/13 0313 12/01/13 0500  Weight: 167 lb 1.7 oz (75.8 kg) 167 lb 8.8 oz (76 kg) 168 lb 6.9 oz (76.4 kg)    PHYSICAL EXAM  General: Pleasant, NAD. Nasal oxygen in place. Neuro: Alert and oriented X 3. Moves all extremities spontaneously. Psych: Normal affect. HEENT:  Normal  Neck: Supple without bruits or  JVD. Lungs: Bilateral wheezing  Heart: irreg.  no s3, s4, or murmurs. Good mechanical aortic valve click Abdomen: Soft, non-tender, non-distended, BS + x 4.  Extremities: No clubbing, cyanosis. Mild edema. DP/PT/Radials 2+ and equal bilaterally.  Accessory Clinical Findings  CBC  Recent Labs  11/30/13 0225  WBC 11.2*  HGB 11.4*  HCT 36.1*  MCV 96.5  PLT 401   Basic Metabolic Panel  Recent Labs  11/29/13 0339 11/30/13 0225  NA 138 140  K 4.0 3.9  CL 96 99  CO2 30 31  GLUCOSE 98 108*  BUN 24* 27*  CREATININE 1.03 1.02  CALCIUM 9.3 9.0   Liver Function Tests No results found for this basename: AST, ALT, ALKPHOS, BILITOT, PROT, ALBUMIN,  in the last 72 hours No results found for this basename: LIPASE, AMYLASE,  in the last 72 hours Cardiac Enzymes No results found for this basename: CKTOTAL, CKMB, CKMBINDEX, TROPONINI,  in the last 72 hours BNP No components found with this basename: POCBNP,  D-Dimer No results found for this basename: DDIMER,  in the last 72  hours Hemoglobin A1C No results found for this basename: HGBA1C,  in the last 72 hours Fasting Lipid Panel  Recent Labs  12/01/13 0316  CHOL 74  HDL 23*  LDLCALC 41  TRIG 51  CHOLHDL 3.2   Thyroid Function Tests No results found for this basename: TSH, T4TOTAL, FREET3, T3FREE, THYROIDAB,  in the last 72 hours  TELE  Atrial fibrillation with controlled VR  ECG  Atrial fibrillation ST & T wave abnormality, consider lateral ischemia Prolonged QT Abnormal ECG  Radiology/Studies  Dg Chest Portable 1 View  11/14/2013   CLINICAL DATA:  Initial evaluation for shortness of breath 1 week, worse today, current history of COPD  EXAM: PORTABLE CHEST - 1 VIEW  COMPARISON:  08/02/2013  FINDINGS: Moderately severe cardiac enlargement stable from prior study. Mild vascular congestion. Mild diffuse interstitial prominence. There is elevation of the right diaphragm with right base opacity similar to prior study  suggesting scarring or atelectasis. There is emphysematous change in the upper lung zones.  IMPRESSION: COPD with chronic scarring in the right base and chronic cardiac enlargement. However, increased interstitial opacities in the mid to lower lung zones currently suggest the possibility of mild cardiogenic interstitial pulmonary edema.   Electronically Signed   By: Skipper Cliche M.D.   On: 11/30/2013 17:08    ASSESSMENT AND PLAN 1. 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   cont home BB.  - pt is not on after load reducing agents.  On  lisinopril 2.5mg  po qd. Titrate as tolerated by BP.  - strict in and outs - he is slowly diuresing.  - daily weights   2.  Chronic lung disease / chronic respiratory failure   - optimize pulm tx - start on spiriva and symbicort.  - nebs prn. No need for high dose steroid at this time.  - cont 3L oxygen per home regiment , he is requiring more O2 at present - 14 liters at present   3. S/p aortic valve replacement - with mechanical valve  - INR goal 2.5-3.5  - Pharmacy consult placed for continuation of coumadin   4.  Chronic AF  - rate controlled  - cont BB and CCB at this time.  - cont [place] as above  5. Mobility:  Will have the nurse get him up and will advance tomorrow.  Will get a PT consult.  6.  Severe malnutrition:  Has been assessed by nutritionist.   Getting ensure.   His prognosis is poor. We may need to consult Hospice and Palliative care   Ramond Dial., MD, [facility] 12/01/2013, 9:16 AM [address]  [place] Pager [phone]

## 2013-12-01 NOTE — Progress Notes (Signed)
[facility] Progress Note Patient Name: John [name] DOB: 07-04-1933 MRN: [id number]   Date of Service  12/01/2013  HPI/Events of Note  Off on bipap still, pos balance remains  eICU Interventions  Dc 2 view in radiology. reviewd rcent films, pcxr in am , lasix increase     Intervention Category Major Interventions: Respiratory failure - evaluation and management  Marquarius Lofton J. 12/01/2013, 8:52 PM

## 2013-12-02 ENCOUNTER — Inpatient Hospital Stay (HOSPITAL_COMMUNITY): Payer: Medicare Other

## 2013-12-02 DIAGNOSIS — J432 Centrilobular emphysema: Secondary | ICD-10-CM

## 2013-12-02 LAB — CBC
HCT: 35.9 % — ABNORMAL LOW (ref 39.0–52.0)
HEMOGLOBIN: 11.3 g/dL — AB (ref 13.0–17.0)
MCH: 29.5 pg (ref 26.0–34.0)
MCHC: 31.5 g/dL (ref 30.0–36.0)
MCV: 93.7 fL (ref 78.0–100.0)
Platelets: 264 10*3/uL (ref 150–400)
RBC: 3.83 MIL/uL — AB (ref 4.22–5.81)
RDW: 16.6 % — ABNORMAL HIGH (ref 11.5–15.5)
WBC: 11.7 10*3/uL — ABNORMAL HIGH (ref 4.0–10.5)

## 2013-12-02 LAB — BASIC METABOLIC PANEL
ANION GAP: 12 (ref 5–15)
BUN: 30 mg/dL — ABNORMAL HIGH (ref 6–23)
CHLORIDE: 99 meq/L (ref 96–112)
CO2: 30 mEq/L (ref 19–32)
CREATININE: 0.92 mg/dL (ref 0.50–1.35)
Calcium: 8.9 mg/dL (ref 8.4–10.5)
GFR calc Af Amer: >90 mL/min (ref >90)
GFR calc non Af Amer: 79 mL/min — ABNORMAL LOW (ref >90)
Glucose, Bld: 91 mg/dL (ref 70–99)
POTASSIUM: 4.7 meq/L (ref 3.7–5.3)
Sodium: 141 mEq/L (ref 137–147)

## 2013-12-02 LAB — PROTIME-INR
INR: 3.97 — AB (ref 0.00–1.49)
Prothrombin Time: 39 seconds — ABNORMAL HIGH (ref 11.6–15.2)

## 2013-12-02 MED ORDER — FUROSEMIDE 10 MG/ML IJ SOLN
80.0000 mg | Freq: Every day | INTRAMUSCULAR | Status: DC
  Filled 2013-12-02: qty 8

## 2013-12-02 NOTE — Discharge Instructions (Signed)
Information on my medicine - Coumadin   (Warfarin)  This medication education was reviewed with me or my healthcare representative as part of my discharge preparation.  The pharmacist that spoke with me during my hospital stay was:  Von Nils Flack, RPH  Why was Coumadin prescribed for you? Coumadin was prescribed for you because you have a blood clot or a medical condition that can cause an increased risk of forming blood clots. Blood clots can cause serious health problems by blocking the flow of blood to the heart, lung, or brain. Coumadin can prevent harmful blood clots from forming. As a reminder your indication for Coumadin is:   Blood Clot Prevention After Heart Valve Surgery  What test will check on my response to Coumadin? While on Coumadin (warfarin) you will need to have an INR test regularly to ensure that your dose is keeping you in the desired range. The INR (international normalized ratio) number is calculated from the result of the laboratory test called prothrombin time (PT).  If an INR APPOINTMENT HAS NOT ALREADY BEEN MADE FOR YOU please schedule an appointment to have this lab work done by your health care provider within 7 days. Your INR goal is usually a number between:  2 to 3 or your provider may give you a more narrow range like 2-2.5.  Ask your health care provider during an office visit what your goal INR is.  What  do you need to  know  About  COUMADIN? Take Coumadin (warfarin) exactly as prescribed by your healthcare provider about the same time each day.  DO NOT stop taking without talking to the doctor who prescribed the medication.  Stopping without other blood clot prevention medication to take the place of Coumadin may increase your risk of developing a new clot or stroke.  Get refills before you run out.  What do you do if you miss a dose? If you miss a dose, take it as soon as you remember on the same day then continue your regularly scheduled regimen the next  day.  Do not take two doses of Coumadin at the same time.  Important Safety Information A possible side effect of Coumadin (Warfarin) is an increased risk of bleeding. You should call your healthcare provider right away if you experience any of the following:   Bleeding from an injury or your nose that does not stop.   Unusual colored urine (red or dark brown) or unusual colored stools (red or black).   Unusual bruising for unknown reasons.   A serious fall or if you hit your head (even if there is no bleeding).  Some foods or medicines interact with Coumadin (warfarin) and might alter your response to warfarin. To help avoid this:   Eat a balanced diet, maintaining a consistent amount of Vitamin K.   Notify your provider about major diet changes you plan to make.   Avoid alcohol or limit your intake to 1 drink for women and 2 drinks for men per day. (1 drink is 5 oz. wine, 12 oz. beer, or 1.5 oz. liquor.)  Make sure that ANY health care provider who prescribes medication for you knows that you are taking Coumadin (warfarin).  Also make sure the healthcare provider who is monitoring your Coumadin knows when you have started a new medication including herbals and non-prescription products.  Coumadin (Warfarin)  Major Drug Interactions  Increased Warfarin Effect Decreased Warfarin Effect  Alcohol (large quantities) Antibiotics (esp. Septra/Bactrim, Flagyl, Cipro) Amiodarone (  Cordarone) Aspirin (ASA) Cimetidine (Tagamet) Megestrol (Megace) NSAIDs (ibuprofen, naproxen, etc.) Piroxicam (Feldene) Propafenone (Rythmol SR) Propranolol (Inderal) Isoniazid (INH) Posaconazole (Noxafil) Barbiturates (Phenobarbital) Carbamazepine (Tegretol) Chlordiazepoxide (Librium) Cholestyramine (Questran) Griseofulvin Oral Contraceptives Rifampin Sucralfate (Carafate) Vitamin K   Coumadin (Warfarin) Major Herbal Interactions  Increased Warfarin Effect Decreased Warfarin Effect   Garlic Ginseng Ginkgo biloba Coenzyme Q10 Green tea St. Johns wort    Coumadin (Warfarin) FOOD Interactions  Eat a consistent number of servings per week of foods HIGH in Vitamin K (1 serving =  cup)  Collards (cooked, or boiled & drained) Kale (cooked, or boiled & drained) Mustard greens (cooked, or boiled & drained) Parsley *serving size only =  cup Spinach (cooked, or boiled & drained) Swiss chard (cooked, or boiled & drained) Turnip greens (cooked, or boiled & drained)  Eat a consistent number of servings per week of foods MEDIUM-HIGH in Vitamin K (1 serving = 1 cup)  Asparagus (cooked, or boiled & drained) Broccoli (cooked, boiled & drained, or raw & chopped) Brussel sprouts (cooked, or boiled & drained) *serving size only =  cup Lettuce, raw (green leaf, endive, romaine) Spinach, raw Turnip greens, raw & chopped   These websites have more information on Coumadin (warfarin):  FailFactory.se; VeganReport.com.au;

## 2013-12-02 NOTE — Progress Notes (Signed)
ANTICOAGULATION CONSULT NOTE - Follow-Up Consult  Pharmacy Consult for Warfarin Indication: atrial fibrillation  Allergies  Allergen Reactions  . Penicillins Hives, Swelling and Rash     facial swelling    Patient Measurements: Height: 5\' 11"  (180.3 cm) Weight: 168 lb 14 oz (76.6 kg) IBW/kg (Calculated) : 75.3  Vital Signs: Temp: 98.6 F (37 C) (10/23 0740) Temp Source: Oral (10/23 0740) BP: 100/64 mmHg (10/23 0800) Pulse Rate: 90 (10/23 0800)  Labs:  Recent Labs  11/30/13 0225 12/01/13 0316 12/01/13 0909 12/02/13 0230  HGB 11.4*  --   --  11.3*  HCT 36.1*  --   --  35.9*  PLT 221  --   --  264  LABPROT 31.7* 37.8*  --  39.0*  INR 3.04* 3.81*  --  3.97*  CREATININE 1.02  --  0.91 0.92    Estimated Creatinine Clearance: 70.5 ml/min (by C-G formula based on Cr of 0.92).   Medical History: Past Medical History  Diagnosis Date  . PAF (paroxysmal atrial fibrillation)     a. intolerant to amio (? lung toxicity) and tikosyn (prolonged QT);  b. 07/2012 s/p DCCV;  c. 09/2012 s/p DCCV - Multaq initiated.  . Aortic valve disorders     a. s/p AVR, 2006;  b. chronic coumadin.  . Other primary cardiomyopathies   . Edema     CHRONIC LOWER EXTREMITY  . Unspecified essential hypertension   . Other and unspecified hyperlipidemia   . Heart valve replaced by other means   . Other malaise and fatigue   . Coronary atherosclerosis of native coronary artery   . Acute myocardial infarction, subendocardial infarction, subsequent episode of care   . Unspecified adverse effect of unspecified drug, medicinal and biological substance   . Umbilical hernia without mention of obstruction or gangrene   . Sebaceous cyst   . Labyrinthitis, unspecified   . Personal history of other diseases of digestive system   . Special screening for malignant neoplasm of prostate   . BC (bronchogenic carcinoma)     S/P LOBECTOMY 2011  . Shortness of breath   . COPD (chronic obstructive pulmonary  disease)   . GERD (gastroesophageal reflux disease)     Assessment: 49 YOM admitted on 11/13/2013 with SOB upon waking. Pharmacy consulted to dose warfarin for Afib and [employer] AVR. INR continues to trend up to SUPRAtherapeutic level at 3.97 despite held dose yesterday evening. Of note, patient has not been eating well since admission which could be having an effect on his INR. H/H has trended down slightly but has no reported s/s bleeding.  PTA dose of 2.5 mg daily EXCEPT for 5 mg on MWF  Goal of Therapy:  INR 2.5-3.5   Plan:  1. Hold warfarin again tonight with continued trend up in INR 2. Daily PT/INR 3. Will continue to monitor for any signs/symptoms of bleeding and will follow up with PT/INR in the a.m.   Thank you for allowing pharmacy to be a part of this patients care team. Harolyn Rutherford, PharmD Clinical Pharmacist - Resident Pager: [phone] Pharmacy: [phone] 12/02/2013 10:08 AM

## 2013-12-02 NOTE — Progress Notes (Signed)
Patient Name: John [name] Date of Encounter: 12/02/2013     Principal Problem:   CHF (congestive heart failure) Active Problems:   Heart failure   Acute respiratory failure with hypoxia   Other emphysema   Protein-calorie malnutrition, severe    SUBJECTIVE  Patient is less dyspneic this am. Good diuresis on IV lasix. Heart rate controlled on diltiazem and metoprolol. On levoquin for recent cellulitis treatment. Peripheral edema much improved according to wife.   He is still short of breath.  Still diuresing - slowly.  Limited by low BP   CURRENT MEDS . allopurinol  100 mg Oral BH-q7a  . antiseptic oral rinse  7 mL Mouth Rinse BID  . arformoterol  15 mcg Nebulization BID  . aspirin EC  81 mg Oral Daily  . budesonide  0.5 mg Nebulization BID  . colchicine  0.6 mg Oral Daily  . diltiazem  120 mg Oral BID  . feeding supplement (ENSURE COMPLETE)  237 mL Oral BID BM  . furosemide  120 mg Intravenous 3 times per day  . guaiFENesin  600 mg Oral BID  . levalbuterol  0.63 mg Nebulization Q6H  . lisinopril  2.5 mg Oral Daily  . metoprolol  50 mg Oral BID  . sodium chloride  3 mL Intravenous Q12H  . tiotropium  18 mcg Inhalation Daily  . Warfarin - Pharmacist Dosing Inpatient   Does not apply q1800    OBJECTIVE  Filed Vitals:   12/02/13 0600 12/02/13 0620 12/02/13 0740 12/02/13 0800  BP:   109/66 100/64  Pulse:   83 90  Temp:   98.6 F (37 C)   TempSrc:   Oral   Resp:   16 25  Height:      Weight:      SpO2: 89% 87% 95% 95%    Intake/Output Summary (Last 24 hours) at 12/02/13 0954 Last data filed at 12/02/13 0600  Gross per 24 hour  Intake    708 ml  Output    950 ml  Net   -242 ml   Filed Weights   11/30/13 0313 12/01/13 0500 12/02/13 0303  Weight: 167 lb 8.8 oz (76 kg) 168 lb 6.9 oz (76.4 kg) 168 lb 14 oz (76.6 kg)    PHYSICAL EXAM  General: Pleasant, NAD. Nasal oxygen in place. Neuro: Alert and oriented X 3. Moves all extremities  spontaneously. Psych: Normal affect. HEENT:  Normal  Neck: Supple without bruits or JVD. Lungs: clear, no wheezing or rales Heart: irreg.  no s3, s4, or murmurs. Good mechanical aortic valve click Abdomen: Soft, non-tender, non-distended, BS + x 4.  Extremities: No clubbing, cyanosis. No  edema. DP/PT/Radials 2+ and equal bilaterally.  Accessory Clinical Findings  CBC  Recent Labs  11/30/13 0225 12/02/13 0230  WBC 11.2* 11.7*  HGB 11.4* 11.3*  HCT 36.1* 35.9*  MCV 96.5 93.7  PLT 221 485   Basic Metabolic Panel  Recent Labs  12/01/13 0909 12/02/13 0230  NA 139 141  K 3.7 4.7  CL 97 99  CO2 30 30  GLUCOSE 95 91  BUN 29* 30*  CREATININE 0.91 0.92  CALCIUM 8.9 8.9   Liver Function Tests No results found for this basename: AST, ALT, ALKPHOS, BILITOT, PROT, ALBUMIN,  in the last 72 hours No results found for this basename: LIPASE, AMYLASE,  in the last 72 hours Cardiac Enzymes No results found for this basename: CKTOTAL, CKMB, CKMBINDEX, TROPONINI,  in the last 72 hours BNP  No components found with this basename: POCBNP,  D-Dimer No results found for this basename: DDIMER,  in the last 72 hours Hemoglobin A1C No results found for this basename: HGBA1C,  in the last 72 hours Fasting Lipid Panel  Recent Labs  12/01/13 0316  CHOL 74  HDL 23*  LDLCALC 41  TRIG 51  CHOLHDL 3.2   Thyroid Function Tests No results found for this basename: TSH, T4TOTAL, FREET3, T3FREE, THYROIDAB,  in the last 72 hours  TELE  Atrial fibrillation with controlled VR  ECG  Atrial fibrillation ST & T wave abnormality, consider lateral ischemia Prolonged QT Abnormal ECG  Radiology/Studies  Dg Chest Portable 1 View  12/02/2013   CLINICAL DATA:  Initial evaluation for shortness of breath 1 week, worse today, current history of COPD  EXAM: PORTABLE CHEST - 1 VIEW  COMPARISON:  08/02/2013  FINDINGS: Moderately severe cardiac enlargement stable from prior study. Mild vascular  congestion. Mild diffuse interstitial prominence. There is elevation of the right diaphragm with right base opacity similar to prior study suggesting scarring or atelectasis. There is emphysematous change in the upper lung zones.  IMPRESSION: COPD with chronic scarring in the right base and chronic cardiac enlargement. However, increased interstitial opacities in the mid to lower lung zones currently suggest the possibility of mild cardiogenic interstitial pulmonary edema.   Electronically Signed   By: Skipper Cliche M.D.   On: 11/17/2013 17:08    ASSESSMENT AND PLAN 1. 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     On  lisinopril 2.5mg  po qd. Titrate as tolerated by BP.  - strict in and outs - he is slowly diuresing.  - daily weights  He has diuresed well and in fact,his weight is below his home weight. I think we can decrease his lasix (was increase back up to TID yesterday by Dr. Titus Mould)  His lungs are completely clear at present and he has not leg edema.   2.  Chronic lung disease / chronic respiratory failure  - optimize pulm tx - start on spiriva and symbicort.  - nebs prn. No need for high dose steroid at this time.  - cont 3L oxygen per home regiment ,his breathing has improved.   3. S/p aortic valve replacement - with mechanical valve  - INR goal 2.5-3.5  - Pharmacy consult placed for continuation of coumadin   4.  Chronic AF  - rate controlled  - cont BB and CCB at this time.  - cont [place] as above  5. Mobility:  Will have the nurse get him up and will advance tomorrow.  Will get a PT consult.  6.  Severe malnutrition:  Has been assessed by nutritionist.   Getting ensure.   7. Altered mental status:  He became "loopy" according to his wife yesterday.  I cannot find any medication or clinical that would have caused this.  He has not received ativan for 3 days.   His prognosis is poor. We may need to consult Hospice and Palliative  care   Ramond Dial., MD, [facility] 12/02/2013, 9:54 AM [address],  [place] Pager [phone]

## 2013-12-02 NOTE — Evaluation (Signed)
Physical Therapy Evaluation Patient Details Name: John [name] MRN: [id number] DOB: 04/22/1933 Today's Date: 12/02/2013   History of Present Illness  Pt adm with resp failure and CHF exacerbation. PMH - lung CA and COPD.  Clinical Impression  Pt admitted with above. Pt currently with functional limitations due to the deficits listed below (see PT Problem List).  Pt will benefit from skilled PT to increase their independence and safety with mobility to allow discharge to the venue listed below. Pt with supportive wife and believe from PT standpoint he will be able to return home with her.      Follow Up Recommendations Home health PT;Supervision/Assistance - 24 hour    Equipment Recommendations  None recommended by PT    Recommendations for Other Services       Precautions / Restrictions Precautions Precautions: Fall Restrictions Weight Bearing Restrictions: No      Mobility  Bed Mobility Overal bed mobility: Needs Assistance Bed Mobility: Supine to Sit     Supine to sit: Min assist     General bed mobility comments: Assist to elevate trunk.  Transfers Overall transfer level: Needs assistance Equipment used: Rolling walker (2 wheeled) Transfers: Sit to/from [facility] Sit to Stand: Min assist;+2 safety/equipment Stand pivot transfers: Min assist;+2 safety/equipment       General transfer comment: Initially verbal cues for hand placment and cues to stay closer to walker.  Ambulation/Gait Ambulation/Gait assistance: Min assist Ambulation Distance (Feet): 8 Feet (forward and backward) Assistive device: Rolling walker (2 wheeled) Gait Pattern/deviations: Step-through pattern;Decreased step length - right;Decreased step length - left;Trunk flexed Gait velocity: decr Gait velocity interpretation: Below normal speed for age/gender General Gait Details: Verbal cues to stand more erect. Pt with dyspnea on ventimask. SaO2>90%.  Stairs             Wheelchair Mobility    Modified Rankin (Stroke Patients Only)       Balance Overall balance assessment: Needs assistance Sitting-balance support: No upper extremity supported;Feet supported Sitting balance-Leahy Scale: Good     Standing balance support: Bilateral upper extremity supported Standing balance-Leahy Scale: Poor Standing balance comment: Support of walker for standing                             Pertinent Vitals/Pain Pain Assessment: Faces Faces Pain Scale: Hurts little more Pain Location: rt great toe Pain Intervention(s): Limited activity within patient's tolerance;Repositioned;Monitored during session    [place] expects to be discharged to:: Private residence Living Arrangements: Spouse/significant other Available Help at Discharge: Family;Available 24 hours/day Type of Home: House (condo) Home Access: Level entry     Home Layout: One level Home Equipment: Walker - 2 wheels Additional Comments: Wife thinks home PT may have removed wheels from walker.    Prior Function Level of Independence: Independent               Hand Dominance        Extremity/Trunk Assessment   Upper Extremity Assessment: Generalized weakness           Lower Extremity Assessment: Generalized weakness         Communication   Communication: No difficulties  Cognition Arousal/Alertness: Awake/alert Behavior During Therapy: WFL for tasks assessed/performed Overall Cognitive Status: Within Functional Limits for tasks assessed                      General Comments  Exercises        Assessment/Plan    PT Assessment    PT Diagnosis Difficulty walking;Generalized weakness   PT Problem List    PT Treatment Interventions     PT Goals (Current goals can be found in the Care Plan section) Acute Rehab PT Goals Patient Stated Goal: return home PT Goal Formulation: With patient/family Time For Goal Achievement:  12/22/13 Potential to Achieve Goals: Good    Frequency     Barriers to discharge        Co-evaluation               End of Session Equipment Utilized During Treatment: Gait belt;Oxygen Activity Tolerance: Patient limited by fatigue Patient left: in chair;with call bell/phone within reach;with chair alarm set Nurse Communication: Mobility status         Time: 5208-0223 PT Time Calculation (min): 24 min   Charges:   PT Evaluation $Initial PT Evaluation Tier I: 1 Procedure PT Treatments $Gait Training: 8-22 mins   PT G Codes:          Henrick Mcgue 12-15-13, 10:23 AM  Suanne Marker PT [phone]

## 2013-12-02 NOTE — Progress Notes (Signed)
PULMONARY / CRITICAL CARE MEDICINE   Name: John [name] MRN: [id number] DOB: 07-06-1933    ADMISSION DATE:  11/16/2013 CONSULTATION DATE:  12/02/2013  REFERRING MD :  Murvin Natal  CHIEF COMPLAINT:  SOB  INITIAL PRESENTATION: 80 y.o. M followed by RB for COPD and [facility] s/p resection brought to [facility] ED on 10/17 with SOB.  Was placed on Levaquin roughly 1 week prior for PNA.  Became hypoxic to 84% despite his normal 3L [place].  In ED, was found to have CHF exacerbation and was subsequently admitted.  PCCM was consulted at the request of pt.   STUDIES:  CXR 10/17 >>> COPD with chronic scarring in right base.  Increased interstitial opacities in mid to lower lung zones, possibly from cardiogenic interstitial pulmonary edema. Echo 7/22 >>> EF 40-45%, diffuse hypokinesis, RV overload, mechanical AV, severely dilated LA, mod - severe TR, PAP 72.  SIGNIFICANT EVENTS: 10/17 - admit 10/20 - PCCM consult   SUBJECTIVE:  Feeling much better today  VITAL SIGNS: Temp:  [98.3 F (36.8 C)-100.3 F (37.9 C)] 98.3 F (36.8 C) (10/23 1122) Pulse Rate:  [45-101] 45 (10/23 1122) Resp:  [16-34] 29 (10/23 1122) BP: (90-109)/(54-71) 97/57 mmHg (10/23 1122) SpO2:  [86 %-97 %] 95 % (10/23 1122) FiO2 (%):  [40 %-55 %] 55 % (10/23 1034) Weight:  [76.6 kg (168 lb 14 oz)] 76.6 kg (168 lb 14 oz) (10/23 0303) HEMODYNAMICS:   VENTILATOR SETTINGS: Vent Mode:  [-]  FiO2 (%):  [40 %-55 %] 55 % INTAKE / OUTPUT: Intake/Output     10/22 0701 - 10/23 0700 10/23 0701 - 10/24 0700   P.O. 760    I.V. (mL/kg)     Other 6    IV Piggyback 62    Total Intake(mL/kg) 828 (10.8)    Urine (mL/kg/hr) 1250 (0.7) 100 (0.3)   Total Output 1250 100   Net -422 -100          PHYSICAL EXAMINATION: General: chronically ill appearing,no acute distress HEENT: NCAT, EOMi PULM; Crackles right base, left upper lobe slight wheeze CV: Irreg irreg, systolic murmur AB: BS+, soft, nontender Ext: warm trace edema Neuro: A&Ox4,  [place]  LABS:  CBC  Recent Labs Lab 12/01/2013 1622 11/30/13 0225 12/02/13 0230  WBC 10.6* 11.2* 11.7*  HGB 12.3* 11.4* 11.3*  HCT 39.4 36.1* 35.9*  PLT 260 221 264   Coag's  Recent Labs Lab 11/30/13 0225 12/01/13 0316 12/02/13 0230  INR 3.04* 3.81* 3.97*   BMET  Recent Labs Lab 11/30/13 0225 12/01/13 0909 12/02/13 0230  NA 140 139 141  K 3.9 3.7 4.7  CL 99 97 99  CO2 31 30 30   BUN 27* 29* 30*  CREATININE 1.02 0.91 0.92  GLUCOSE 108* 95 91   Electrolytes  Recent Labs Lab 11/30/13 0225 12/01/13 0909 12/02/13 0230  CALCIUM 9.0 8.9 8.9   Sepsis Markers No results found for this basename: LATICACIDVEN, PROCALCITON, O2SATVEN,  in the last 168 hours ABG No results found for this basename: PHART, PCO2ART, PO2ART,  in the last 168 hours Liver Enzymes No results found for this basename: AST, ALT, ALKPHOS, BILITOT, ALBUMIN,  in the last 168 hours Cardiac Enzymes  Recent Labs Lab 11/18/2013 1622  PROBNP 2357.0*   Glucose No results found for this basename: GLUCAP,  in the last 168 hours  Imaging Dg Chest Port 1 View  12/02/2013   CLINICAL DATA:  Acute respiratory distress  EXAM: PORTABLE CHEST - 1 VIEW  COMPARISON:  12/01/2013  FINDINGS: Cardiac shadow is stable. Postoperative changes are again seen. A right-sided pleural effusion is again noted. Mild bibasilar atelectatic changes are seen. There is been improvement in the degree of interstitial prominence when compare with the prior exam. No pneumothorax is seen.  IMPRESSION: Bibasilar atelectatic changes as well as a small right-sided pleural effusion. There has been overall improved aeration bilaterally when compared with the previous day.   Electronically Signed   By: Inez Catalina M.D.   On: 12/02/2013 07:38   Dg Chest Port 1 View  12/01/2013   CLINICAL DATA:  Acute respiratory failure. Hypoxemia. Shortness of breath.  EXAM: PORTABLE CHEST - 1 VIEW  COMPARISON:  11/30/2013  FINDINGS: Moderate enlargement of  the cardiopericardial silhouette with mild right hemidiaphragmatic elevation, and evidence of prior open heart surgery and right lower lobectomy.  Worsened interstitial opacity bilaterally, particularly evident in the left lung. Atherosclerotic aortic arch. Severe emphysema.  IMPRESSION: 1. Worsened bilateral interstitial accentuation with underlying cardiomegaly, favoring interstitial pulmonary edema. Atypical pneumonia possible but less likely. 2. Prior right lower lobectomy and prior aortic valve replacement. 3. Severe emphysema.   Electronically Signed   By: Sherryl Barters M.D.   On: 12/01/2013 07:50     ASSESSMENT / PLAN:  PULMONARY A: Severe centrilobular emphysema Acute hypoxemic respiratory failure due to atelectasis and pulm edema > now near dry weight but has persistent hypoxemia worse than baseline NSCLC s/p resection P:   Obtain 2 view CXR for better imaging > may need CT chest  Continue spiriva Continue CPAP at bedtime. PRN albuterol. Use budesonide / brovana while hospitalized. Continue to keep net negative Follow up with pulmonology after discharge. OUT OF BED Incentive spirometry flutter  CARDIOVASCULAR A:  Acute on chronic systolic heart failure A.fib rate controlled Mechanical aortic valve P:  Cardiology following / managing. Continue coumadin. Would continue to keep slightly net negative   NEUROLOGIC A:   Deconditioning Anxiety P:   OOB with nurse assist. PT eval   Family updated: Wife updated at bedside 10/23; I explained that he has severe lung disease, may need CT chest to evaluate further if he doesn't return to baseline with diuresis over weekend  Interdisciplinary Family Meeting v Palliative Care Meeting:  Would recommend palliative care consultation, overall prognosis seems poor.  Roselie Awkward, MD [place] [facility] Pager: [phone] Cell: [phone] If no response, call [phone]

## 2013-12-03 LAB — PROTIME-INR
INR: 3.54 — ABNORMAL HIGH (ref 0.00–1.49)
Prothrombin Time: 35.7 seconds — ABNORMAL HIGH (ref 11.6–15.2)

## 2013-12-03 LAB — BASIC METABOLIC PANEL
Anion gap: 14 (ref 5–15)
BUN: 33 mg/dL — AB (ref 6–23)
CHLORIDE: 100 meq/L (ref 96–112)
CO2: 27 meq/L (ref 19–32)
Calcium: 9.3 mg/dL (ref 8.4–10.5)
Creatinine, Ser: 0.92 mg/dL (ref 0.50–1.35)
GFR calc Af Amer: >90 mL/min (ref >90)
GFR calc non Af Amer: 79 mL/min — ABNORMAL LOW (ref >90)
Glucose, Bld: 111 mg/dL — ABNORMAL HIGH (ref 70–99)
Potassium: 4.6 mEq/L (ref 3.7–5.3)
SODIUM: 141 meq/L (ref 137–147)

## 2013-12-03 LAB — CBC
HEMATOCRIT: 38 % — AB (ref 39.0–52.0)
HEMOGLOBIN: 11.9 g/dL — AB (ref 13.0–17.0)
MCH: 29.8 pg (ref 26.0–34.0)
MCHC: 31.3 g/dL (ref 30.0–36.0)
MCV: 95.2 fL (ref 78.0–100.0)
PLATELETS: 293 10*3/uL (ref 150–400)
RBC: 3.99 MIL/uL — ABNORMAL LOW (ref 4.22–5.81)
RDW: 16.7 % — ABNORMAL HIGH (ref 11.5–15.5)
WBC: 11.8 10*3/uL — ABNORMAL HIGH (ref 4.0–10.5)

## 2013-12-03 MED ORDER — VASOPRESSIN 20 UNIT/ML IV SOLN
0.0300 [IU]/min | INTRAVENOUS | Status: DC
  Filled 2013-12-03: qty 2

## 2013-12-03 MED ORDER — FUROSEMIDE 10 MG/ML IJ SOLN
120.0000 mg | Freq: Two times a day (BID) | INTRAVENOUS | Status: DC
  Administered 2013-12-03 – 2013-12-04 (×3): 120 mg via INTRAVENOUS
  Filled 2013-12-03 (×4): qty 12

## 2013-12-03 MED ORDER — WARFARIN SODIUM 2.5 MG PO TABS
2.5000 mg | ORAL_TABLET | Freq: Once | ORAL | Status: AC
  Administered 2013-12-03: 2.5 mg via ORAL
  Filled 2013-12-03: qty 1

## 2013-12-03 MED ORDER — ALLOPURINOL 100 MG PO TABS
100.0000 mg | ORAL_TABLET | Freq: Every day | ORAL | Status: DC
  Administered 2013-12-03 – 2013-12-08 (×6): 100 mg via ORAL
  Filled 2013-12-03 (×7): qty 1

## 2013-12-03 NOTE — Progress Notes (Signed)
ANTICOAGULATION CONSULT NOTE - Follow-Up Consult  Pharmacy Consult for Warfarin Indication: atrial fibrillation  Allergies  Allergen Reactions  . Penicillins Hives, Swelling and Rash     facial swelling    Patient Measurements: Height: 5\' 11"  (180.3 cm) Weight: 164 lb 3.9 oz (74.5 kg) IBW/kg (Calculated) : 75.3  Vital Signs: Temp: 98 F (36.7 C) (10/24 0846) Temp Source: Oral (10/24 0846) BP: 109/73 mmHg (10/24 0846) Pulse Rate: 85 (10/24 0846)  Labs:  Recent Labs  12/01/13 0316 12/01/13 0909 12/02/13 0230 12/03/13 0225  HGB  --   --  11.3* 11.9*  HCT  --   --  35.9* 38.0*  PLT  --   --  264 293  LABPROT 37.8*  --  39.0* 35.7*  INR 3.81*  --  3.97* 3.54*  CREATININE  --  0.91 0.92 0.92    Estimated Creatinine Clearance: 69.7 ml/min (by C-G formula based on Cr of 0.92).   Medical History: Past Medical History  Diagnosis Date  . PAF (paroxysmal atrial fibrillation)     a. intolerant to amio (? lung toxicity) and tikosyn (prolonged QT);  b. 07/2012 s/p DCCV;  c. 09/2012 s/p DCCV - Multaq initiated.  . Aortic valve disorders     a. s/p AVR, 2006;  b. chronic coumadin.  . Other primary cardiomyopathies   . Edema     CHRONIC LOWER EXTREMITY  . Unspecified essential hypertension   . Other and unspecified hyperlipidemia   . Heart valve replaced by other means   . Other malaise and fatigue   . Coronary atherosclerosis of native coronary artery   . Acute myocardial infarction, subendocardial infarction, subsequent episode of care   . Unspecified adverse effect of unspecified drug, medicinal and biological substance   . Umbilical hernia without mention of obstruction or gangrene   . Sebaceous cyst   . Labyrinthitis, unspecified   . Personal history of other diseases of digestive system   . Special screening for malignant neoplasm of prostate   . BC (bronchogenic carcinoma)     S/P LOBECTOMY 2011  . Shortness of breath   . COPD (chronic obstructive pulmonary  disease)   . GERD (gastroesophageal reflux disease)     Assessment: 7 YOM admitted on 11/28/2013 with SOB upon waking. Pharmacy consulted to dose warfarin for Afib and St. Jude AVR. INR has now trended down to just above goal at 3.54 after 2 held doses. Of note, patient has not been eating well since admission which could be having an effect on his INR. H/H has trended down slightly but remains stable. Patient's wife reports coughing up some blood stained mucus but no other s/s bleeding noted.   PTA dose of 2.5 mg daily EXCEPT for 5 mg on MWF  Goal of Therapy:  INR 2.5-3.5   Plan:  1. Give Coumadin 2.5 mg PO x 1 tonight 2. Daily PT/INR 3. Will continue to monitor for any signs/symptoms of bleeding and will follow up with PT/INR in the a.m.   Thank you for allowing pharmacy to be a part of this patients care team. Harolyn Rutherford, PharmD Clinical Pharmacist - Resident Pager: [phone] Pharmacy: [phone] 12/03/2013 9:08 AM

## 2013-12-03 NOTE — Progress Notes (Signed)
Patient Name: John [name] Date of Encounter: 12/03/2013     Principal Problem:   CHF (congestive heart failure) Active Problems:   Heart failure   Acute respiratory failure with hypoxia   Other emphysema   Protein-calorie malnutrition, severe    SUBJECTIVE  Patient remains dyspneic this am.  Heart rate controlled on diltiazem and metoprolol. On levoquin for recent cellulitis treatment. Peripheral edema much improved according to wife.  He had cough with yellow sputum and some blood overnight.   CURRENT MEDS . allopurinol  100 mg Oral Daily  . antiseptic oral rinse  7 mL Mouth Rinse BID  . arformoterol  15 mcg Nebulization BID  . aspirin EC  81 mg Oral Daily  . budesonide  0.5 mg Nebulization BID  . colchicine  0.6 mg Oral Daily  . diltiazem  120 mg Oral BID  . feeding supplement (ENSURE COMPLETE)  237 mL Oral BID BM  . furosemide  80 mg Intravenous Daily  . guaiFENesin  600 mg Oral BID  . levalbuterol  0.63 mg Nebulization Q6H  . lisinopril  2.5 mg Oral Daily  . metoprolol  50 mg Oral BID  . sodium chloride  3 mL Intravenous Q12H  . tiotropium  18 mcg Inhalation Daily  . warfarin  2.5 mg Oral ONCE-1800  . Warfarin - Pharmacist Dosing Inpatient   Does not apply q1800    OBJECTIVE  Filed Vitals:   12/03/13 0500 12/03/13 0800 12/03/13 0812 12/03/13 0846  BP:  109/73  109/73  Pulse: 76 82  85  Temp:    98 F (36.7 C)  TempSrc:    Oral  Resp: 17 24  24   Height:      Weight:      SpO2: 98% 95% 97% 89%    Intake/Output Summary (Last 24 hours) at 12/03/13 1111 Last data filed at 12/03/13 0945  Gross per 24 hour  Intake    430 ml  Output    550 ml  Net   -120 ml   Filed Weights   12/01/13 0500 12/02/13 0303 12/03/13 0300  Weight: 168 lb 6.9 oz (76.4 kg) 168 lb 14 oz (76.6 kg) 164 lb 3.9 oz (74.5 kg)    PHYSICAL EXAM  General: Pleasant, NAD. Nasal oxygen in place. Neuro: Alert and oriented X 3. Moves all extremities spontaneously. Psych: flat  affect. HEENT:  OP clear, wearing O2  Neck: Supple, + elevated JVP Lungs: decreased BS throughout Heart: iiRRR Abdomen: Soft, non-tender, non-distended, BS + x 4.  Extremities: No clubbing, cyanosis. + dependant edema (chronic) with venous stasis changes DP/PT/Radials 2+ and equal bilaterally.   Accessory Clinical Findings  CBC  Recent Labs  12/02/13 0230 12/03/13 0225  WBC 11.7* 11.8*  HGB 11.3* 11.9*  HCT 35.9* 38.0*  MCV 93.7 95.2  PLT 264 294   Basic Metabolic Panel  Recent Labs  12/02/13 0230 12/03/13 0225  NA 141 141  K 4.7 4.6  CL 99 100  CO2 30 27  GLUCOSE 91 111*  BUN 30* 33*  CREATININE 0.92 0.92  CALCIUM 8.9 9.3  Fasting Lipid Panel  Recent Labs  12/01/13 0316  CHOL 74  HDL 23*  LDLCALC 41  TRIG 51  CHOLHDL 3.2   Thyroid Function Tests No results found for this basename: TSH, T4TOTAL, FREET3, T3FREE, THYROIDAB,  in the last 72 hours  TELE  Atrial fibrillation with controlled VR  ECG  Atrial fibrillation ST & T wave abnormality, consider lateral ischemia  Prolonged QT Abnormal ECG  Radiology/Studies  Dg Chest Portable 1 View  11/20/2013   CLINICAL DATA:  Initial evaluation for shortness of breath 1 week, worse today, current history of COPD  EXAM: PORTABLE CHEST - 1 VIEW  COMPARISON:  08/02/2013  FINDINGS: Moderately severe cardiac enlargement stable from prior study. Mild vascular congestion. Mild diffuse interstitial prominence. There is elevation of the right diaphragm with right base opacity similar to prior study suggesting scarring or atelectasis. There is emphysematous change in the upper lung zones.  IMPRESSION: COPD with chronic scarring in the right base and chronic cardiac enlargement. However, increased interstitial opacities in the mid to lower lung zones currently suggest the possibility of mild cardiogenic interstitial pulmonary edema.   Electronically Signed   By: Skipper Cliche M.D.   On: 11/29/2013 17:08    ASSESSMENT  AND PLAN 1. Combined Right and Left sided Heart failure / acute on chronic CHF systolic and diastolic CHF Continue diuresis as able Continue hold optimize ace inhibitor Would keep beta blocker dose as is and not increase given lung disease. - strict in and outs - he is slowly diuresing.  - daily weights  Given elevated JVP and poor clinical improvement, I would favor a more aggressive diuresis  2.  Chronic lung disease / chronic respiratory failure  - optimize pulm tx - start on spiriva and symbicort.  - nebs prn. No need for high dose steroid at this time.  - cont 3L oxygen per home regiment ,his breathing has improved.  I am concerned that he has more advanced respiratory failure  3. S/p aortic valve replacement - with mechanical valve  - INR goal 2.5-3.5  - Pharmacy consult placed for continuation of coumadin   4.  Chronic AF  - rate controlled  - cont BB and CCB at this time.  - cont [place] therapy  5. Mobility:  PT to follow  6.  Severe malnutrition:  Has been assessed by nutritionist.   Getting ensure.   7. Altered mental status:  Improved today  His prognosis is very poor. The patient would like to remain full code and continue aggressive medical therapy but is willing to discuss options with the palliative care team.  I had a long discussion with him today regarding his poor prognosis.  He does not seem to fully appreciate his current condition.  Hopefully pulmonary can assist with this discussion from their standpoint.  The patient is critically ill with multiple organ systems failure and requires high complexity decision making for assessment and support, frequent evaluation and titration of therapies, application of advanced monitoring technologies and extensive interpretation of multiple databases.   Total CCT spent directly with the patient today is 30 minutes

## 2013-12-04 ENCOUNTER — Inpatient Hospital Stay (HOSPITAL_COMMUNITY): Payer: Medicare Other

## 2013-12-04 DIAGNOSIS — J9621 Acute and chronic respiratory failure with hypoxia: Secondary | ICD-10-CM

## 2013-12-04 DIAGNOSIS — I5023 Acute on chronic systolic (congestive) heart failure: Secondary | ICD-10-CM

## 2013-12-04 LAB — BASIC METABOLIC PANEL
Anion gap: 11 (ref 5–15)
BUN: 33 mg/dL — AB (ref 6–23)
CHLORIDE: 96 meq/L (ref 96–112)
CO2: 34 mEq/L — ABNORMAL HIGH (ref 19–32)
Calcium: 9.6 mg/dL (ref 8.4–10.5)
Creatinine, Ser: 0.94 mg/dL (ref 0.50–1.35)
GFR calc Af Amer: >90 mL/min (ref >90)
GFR calc non Af Amer: 78 mL/min — ABNORMAL LOW (ref >90)
GLUCOSE: 97 mg/dL (ref 70–99)
POTASSIUM: 4.6 meq/L (ref 3.7–5.3)
Sodium: 141 mEq/L (ref 137–147)

## 2013-12-04 LAB — PROTIME-INR
INR: 3.34 — ABNORMAL HIGH (ref 0.00–1.49)
Prothrombin Time: 34.1 seconds — ABNORMAL HIGH (ref 11.6–15.2)

## 2013-12-04 LAB — BLOOD GAS, ARTERIAL
Acid-Base Excess: 9.2 mmol/L — ABNORMAL HIGH (ref 0.0–2.0)
Bicarbonate: 34.4 mEq/L — ABNORMAL HIGH (ref 20.0–24.0)
DELIVERY SYSTEMS: POSITIVE
Drawn by: 23604
Expiratory PAP: 6
FIO2: 0.9 %
Inspiratory PAP: 12
O2 Saturation: 93.9 %
PO2 ART: 79 mmHg — AB (ref 80.0–100.0)
Patient temperature: 100.5
TCO2: 36.2 mmol/L (ref 0–100)
pCO2 arterial: 61.6 mmHg (ref 35.0–45.0)
pH, Arterial: 7.371 (ref 7.350–7.450)

## 2013-12-04 LAB — CBC
HCT: 40.5 % (ref 39.0–52.0)
Hemoglobin: 12.6 g/dL — ABNORMAL LOW (ref 13.0–17.0)
MCH: 30.1 pg (ref 26.0–34.0)
MCHC: 31.1 g/dL (ref 30.0–36.0)
MCV: 96.7 fL (ref 78.0–100.0)
Platelets: 282 10*3/uL (ref 150–400)
RBC: 4.19 MIL/uL — AB (ref 4.22–5.81)
RDW: 16.5 % — ABNORMAL HIGH (ref 11.5–15.5)
WBC: 13.5 10*3/uL — ABNORMAL HIGH (ref 4.0–10.5)

## 2013-12-04 LAB — EXPECTORATED SPUTUM ASSESSMENT W GRAM STAIN, RFLX TO RESP C

## 2013-12-04 MED ORDER — SODIUM CHLORIDE 0.9 % IV SOLN
INTRAVENOUS | Status: AC
  Administered 2013-12-04: 15:00:00 via INTRAVENOUS

## 2013-12-04 MED ORDER — WARFARIN SODIUM 2.5 MG PO TABS
2.5000 mg | ORAL_TABLET | Freq: Once | ORAL | Status: DC
  Filled 2013-12-04: qty 1

## 2013-12-04 MED ORDER — LEVOFLOXACIN IN D5W 750 MG/150ML IV SOLN
750.0000 mg | INTRAVENOUS | Status: DC
  Administered 2013-12-04 – 2013-12-08 (×5): 750 mg via INTRAVENOUS
  Filled 2013-12-04 (×6): qty 150

## 2013-12-04 MED ORDER — SODIUM CHLORIDE 0.9 % IV SOLN
INTRAVENOUS | Status: DC
  Administered 2013-12-04 – 2013-12-05 (×2): via INTRAVENOUS

## 2013-12-04 MED ORDER — SODIUM CHLORIDE 0.9 % IV SOLN
1500.0000 mg | Freq: Once | INTRAVENOUS | Status: AC
  Administered 2013-12-04: 1500 mg via INTRAVENOUS
  Filled 2013-12-04: qty 1500

## 2013-12-04 MED ORDER — SODIUM CHLORIDE 0.9 % IV SOLN
Freq: Once | INTRAVENOUS | Status: AC
  Administered 2013-12-04: 14:00:00 via INTRAVENOUS

## 2013-12-04 MED ORDER — METOPROLOL TARTRATE 50 MG PO TABS
50.0000 mg | ORAL_TABLET | Freq: Two times a day (BID) | ORAL | Status: DC
  Filled 2013-12-04 (×3): qty 1

## 2013-12-04 MED ORDER — VANCOMYCIN HCL IN DEXTROSE 1-5 GM/200ML-% IV SOLN
1000.0000 mg | Freq: Two times a day (BID) | INTRAVENOUS | Status: DC
  Administered 2013-12-05 – 2013-12-06 (×3): 1000 mg via INTRAVENOUS
  Filled 2013-12-04 (×5): qty 200

## 2013-12-04 MED ORDER — ACETYLCYSTEINE 20 % IN SOLN
4.0000 mL | Freq: Two times a day (BID) | RESPIRATORY_TRACT | Status: AC
  Administered 2013-12-04 – 2013-12-06 (×4): 4 mL via RESPIRATORY_TRACT
  Filled 2013-12-04 (×5): qty 4

## 2013-12-04 MED ORDER — DEXAMETHASONE SODIUM PHOSPHATE 4 MG/ML IJ SOLN
4.0000 mg | Freq: Two times a day (BID) | INTRAMUSCULAR | Status: DC
  Administered 2013-12-04 – 2013-12-06 (×5): 4 mg via INTRAVENOUS
  Filled 2013-12-04 (×6): qty 1

## 2013-12-04 NOTE — Progress Notes (Deleted)
12/04/2013 1735  Rt arterial sheath removed. Pressure held times 20 minutes. Site Level 0 pre and post. Instructions given to pt. Dssg applied. Kelliann Pendergraph, Carolynn Comment

## 2013-12-04 NOTE — Progress Notes (Signed)
ANTIBIOTIC CONSULT NOTE - INITIAL  Pharmacy Consult for Vancomycin Indication: rule out pneumonia  Allergies  Allergen Reactions  . Penicillins Hives, Swelling and Rash     facial swelling    Patient Measurements: Height: 5\' 11"  (180.3 cm) Weight: 169 lb 5 oz (76.8 kg) IBW/kg (Calculated) : 75.3  Vital Signs: Temp: 100.1 F (37.8 C) (10/25 1200) Temp Source: Oral (10/25 1200) BP: 111/74 mmHg (10/25 1200) Pulse Rate: 103 (10/25 1200) Intake/Output from previous day: 10/24 0701 - 10/25 0700 In: 962 [P.O.:900; IV Piggyback:62] Out: 1375 [[id number]] Intake/Output from this shift: Total I/O In: -  Out: 275 [Urine:275]  Labs:  Recent Labs  12/02/13 0230 12/03/13 0225 12/04/13 0236  WBC 11.7* 11.8* 13.5*  HGB 11.3* 11.9* 12.6*  PLT 264 293 282  CREATININE 0.92 0.92 0.94   Estimated Creatinine Clearance: 69 ml/min (by C-G formula based on Cr of 0.94). No results found for this basename: VANCOTROUGH, Corlis Leak, VANCORANDOM, GENTTROUGH, GENTPEAK, GENTRANDOM, TOBRATROUGH, TOBRAPEAK, TOBRARND, AMIKACINPEAK, AMIKACINTROU, AMIKACIN,  in the last 72 hours   Microbiology: Recent Results (from the past 720 hour(s))  MRSA PCR SCREENING     Status: None   Collection Time    11/16/2013  8:10 PM      Result Value Ref Range Status   MRSA by PCR NEGATIVE  NEGATIVE Final   Comment:            The GeneXpert MRSA Assay (FDA     approved for NASAL specimens     only), is one component of a     comprehensive MRSA colonization     surveillance program. It is not     intended to diagnose MRSA     infection nor to guide or     monitor treatment for     MRSA infections.    Medical History: Past Medical History  Diagnosis Date  . PAF (paroxysmal atrial fibrillation)     a. intolerant to amio (? lung toxicity) and tikosyn (prolonged QT);  b. 07/2012 s/p DCCV;  c. 09/2012 s/p DCCV - Multaq initiated.  . Aortic valve disorders     a. s/p AVR, 2006;  b. chronic coumadin.  . Other  primary cardiomyopathies   . Edema     CHRONIC LOWER EXTREMITY  . Unspecified essential hypertension   . Other and unspecified hyperlipidemia   . Heart valve replaced by other means   . Other malaise and fatigue   . Coronary atherosclerosis of native coronary artery   . Acute myocardial infarction, subendocardial infarction, subsequent episode of care   . Unspecified adverse effect of unspecified drug, medicinal and biological substance   . Umbilical hernia without mention of obstruction or gangrene   . Sebaceous cyst   . Labyrinthitis, unspecified   . Personal history of other diseases of digestive system   . Special screening for malignant neoplasm of prostate   . BC (bronchogenic carcinoma)     S/P LOBECTOMY 2011  . Shortness of breath   . COPD (chronic obstructive pulmonary disease)   . GERD (gastroesophageal reflux disease)     Medications:  Scheduled:  . sodium chloride   Intravenous Once  . acetylcysteine  4 mL Nebulization BID  . allopurinol  100 mg Oral Daily  . antiseptic oral rinse  7 mL Mouth Rinse BID  . arformoterol  15 mcg Nebulization BID  . aspirin EC  81 mg Oral Daily  . budesonide  0.5 mg Nebulization BID  . colchicine  0.6 mg  Oral Daily  . dexamethasone  4 mg Intravenous Q12H  . diltiazem  120 mg Oral BID  . feeding supplement (ENSURE COMPLETE)  237 mL Oral BID BM  . furosemide  120 mg Intravenous BID  . guaiFENesin  600 mg Oral BID  . levalbuterol  0.63 mg Nebulization Q6H  . levofloxacin (LEVAQUIN) IV  750 mg Intravenous Q24H  . lisinopril  2.5 mg Oral Daily  . metoprolol  50 mg Oral BID  . sodium chloride  3 mL Intravenous Q12H  . tiotropium  18 mcg Inhalation Daily  . Warfarin - Pharmacist Dosing Inpatient   Does not apply q1800   Assessment: 75 YOM admitted on 11/30/2013 with SOB upon waking. Not improving from respiratory standpoint and CXR concerning for possible RML PNA to start IV abx today. Of note, patient recently completed treatment for  CAP with LVQ on 10/19. Tmax 100.5, WBC trend up to 13.5, sputum cx sent. SCr has been stable at 0.94 (CrCl ~69 ml/min).   Goal of Therapy:  Vancomycin trough level 15-20 mcg/ml  Plan:  - Start Vanc 1500 mg IV x 1, followed by 1000 mg IV q12h  - LVQ 750 mg IV q24h per MD - Follow SCr, temp, WBC, C&S, VT at [place], PharmD Clinical Pharmacist - Resident Pager: [phone] Pharmacy: [phone] 12/04/2013 2:29 PM

## 2013-12-04 NOTE — Progress Notes (Signed)
Patients ABG results confirmed with Dr. Mauri Brooklyn. No new orders received for now. Continue to closely monitor tonight and keep patient comfortable. RT following closely. Patient resting with Bipap on. Foley intact draining clear yellow urine.

## 2013-12-04 NOTE — Progress Notes (Signed)
Patient removed from BI-Pap and placed on Non Rebreather, SPO2:98

## 2013-12-04 NOTE — Progress Notes (Signed)
eLink Physician-Brief Progress Note Patient Name: HEATHER STREEPER DOB: [birth year] MRN: [id number]   Date of Service  12/04/2013  HPI/Events of Note  80 yr old male with COPD, lung cancer with prior resection, and depressed EF with resp distress on bipap.  Review of notes suggests poor prognosis.  eICU Interventions  Increase ipap to 12 Check ABG and cxr stat May need to discuss advance directives tonight with patient     Intervention Category Intermediate Interventions: Respiratory distress - evaluation and management  Mauri Brooklyn, P 12/04/2013, 3:38 AM

## 2013-12-04 NOTE — Progress Notes (Addendum)
ANTICOAGULATION CONSULT NOTE - Follow-Up Consult  Pharmacy Consult for Warfarin Indication: atrial fibrillation  Allergies  Allergen Reactions  . Penicillins Hives, Swelling and Rash     facial swelling    Patient Measurements: Height: 5\' 11"  (180.3 cm) Weight: 169 lb 5 oz (76.8 kg) IBW/kg (Calculated) : 75.3  Vital Signs: Temp: 99.6 F (37.6 C) (10/25 0740) Temp Source: Axillary (10/25 0740) BP: 92/55 mmHg (10/25 0800) Pulse Rate: 100 (10/25 0900)  Labs:  Recent Labs  12/02/13 0230 12/03/13 0225 12/04/13 0236  HGB 11.3* 11.9* 12.6*  HCT 35.9* 38.0* 40.5  PLT 264 293 282  LABPROT 39.0* 35.7* 34.1*  INR 3.97* 3.54* 3.34*  CREATININE 0.92 0.92 0.94    Estimated Creatinine Clearance: 69 ml/min (by C-G formula based on Cr of 0.94).   Medical History: Past Medical History  Diagnosis Date  . PAF (paroxysmal atrial fibrillation)     a. intolerant to amio (? lung toxicity) and tikosyn (prolonged QT);  b. 07/2012 s/p DCCV;  c. 09/2012 s/p DCCV - Multaq initiated.  . Aortic valve disorders     a. s/p AVR, 2006;  b. chronic coumadin.  . Other primary cardiomyopathies   . Edema     CHRONIC LOWER EXTREMITY  . Unspecified essential hypertension   . Other and unspecified hyperlipidemia   . Heart valve replaced by other means   . Other malaise and fatigue   . Coronary atherosclerosis of native coronary artery   . Acute myocardial infarction, subendocardial infarction, subsequent episode of care   . Unspecified adverse effect of unspecified drug, medicinal and biological substance   . Umbilical hernia without mention of obstruction or gangrene   . Sebaceous cyst   . Labyrinthitis, unspecified   . Personal history of other diseases of digestive system   . Special screening for malignant neoplasm of prostate   . BC (bronchogenic carcinoma)     S/P LOBECTOMY 2011  . Shortness of breath   . COPD (chronic obstructive pulmonary disease)   . GERD (gastroesophageal reflux  disease)     Assessment: 51 YOM admitted on 12/05/2013 with SOB upon waking. Pharmacy consulted to dose warfarin for Afib and St. Jude AVR. INR has now trended down to goal range at 3.34 after 2 held doses and restart of home dose yesterday evening. Of note, patient has not been eating well since admission which could be having an effect on his INR. H/H has improved but remains slightly low, plt wnl and stable. Has been having some hemoptysis so will allow INR to drift down and will consider decreasing goal to 2-3.   PTA dose of 2.5 mg daily EXCEPT for 5 mg on MWF  Goal of Therapy:  INR 2.5-3.5   Plan:  1. Hold Coumadin tonight  2. Daily PT/INR 3. Will continue to monitor for any signs/symptoms of bleeding and will follow up with PT/INR in the a.m.   Thank you for allowing pharmacy to be a part of this patients care team. Harolyn Rutherford, PharmD Clinical Pharmacist - Resident Pager: [phone] Pharmacy: [phone] 12/04/2013 9:50 AM

## 2013-12-04 NOTE — Progress Notes (Signed)
Lasix 120mg  gtt hung early for patients tachypneic episode, ordered by Dr. Mauri Brooklyn. Will monitor closely.

## 2013-12-04 NOTE — Progress Notes (Signed)
eLink Physician-Brief Progress Note Patient Name: John [name] DOB: [birth year] MRN: [id number]   Date of Service  12/04/2013  HPI/Events of Note  80 yr old male with COPD, history of lung ca with resection, and decreased EF with SOB and now hypotension.  Generally declining.  CT of chest today with possible cancer recurrence.  eICU Interventions  Stop lasix Gentle fluids Received BP meds earlier today which may need to be held     Intervention Category Major Interventions: Hypotension - evaluation and management  Mauri Brooklyn, P 12/04/2013, 3:19 PM

## 2013-12-04 NOTE — Progress Notes (Addendum)
PULMONARY / CRITICAL CARE MEDICINE   Name: John [name] MRN: [id number] DOB: 1933-06-21    ADMISSION DATE:  11/27/2013 CONSULTATION DATE:  12/04/2013  REFERRING MD :  Murvin Natal  CHIEF COMPLAINT:  SOB  INITIAL PRESENTATION: 80 y.o. M followed by RB for COPD and [facility] s/p resection brought to [facility] ED on 10/17 with SOB.  Was placed on Levaquin roughly 1 week prior for PNA.  Became hypoxic to 84% despite his normal 3L [place].  In ED, was found to have CHF exacerbation and was subsequently admitted.  PCCM was consulted at the request of pt.   STUDIES:  CXR 10/17 >>> COPD with chronic scarring in right base.  Increased interstitial opacities in mid to lower lung zones, possibly from cardiogenic interstitial pulmonary edema. Echo 7/22 >>> EF 40-45%, diffuse hypokinesis, RV overload, mechanical AV, severely dilated LA, mod - severe TR, PAP 72.  SIGNIFICANT EVENTS: 10/17 - admit 10/20 - PCCM consult 10/24- BIPAP needed overnight, 100% NRB this am   SUBJECTIVE:  Some bright red blood with couighing  VITAL SIGNS: Temp:  [97 F (36.1 C)-100.5 F (38.1 C)] 99.6 F (37.6 C) (10/25 0740) Pulse Rate:  [69-104] 103 (10/25 0956) Resp:  [21-40] 30 (10/25 0956) BP: (88-128)/(52-75) 103/52 mmHg (10/25 0956) SpO2:  [89 %-99 %] 96 % (10/25 1013) FiO2 (%):  [90 %-100 %] 100 % (10/25 0937) Weight:  [76.8 kg (169 lb 5 oz)] 76.8 kg (169 lb 5 oz) (10/25 0304) HEMODYNAMICS:   VENTILATOR SETTINGS: Vent Mode:  [-]  FiO2 (%):  [90 %-100 %] 100 % INTAKE / OUTPUT: Intake/Output     10/24 0701 - 10/25 0700 10/25 0701 - 10/26 0700   P.O. 900    IV Piggyback 62    Total Intake(mL/kg) 962 (12.5)    Urine (mL/kg/hr) 1375 (0.7)    Total Output 1375     Net -413            PHYSICAL EXAMINATION: General: chronically ill appearing,no acute distress but on 100% HEENT: NCAT, jvd down PULM; coarse CV: Irreg irreg, systolic murmur AB: BS+, soft, nontender Ext: warm trace edema Neuro: A&Ox4,  [place]  LABS:  CBC  Recent Labs Lab 12/02/13 0230 12/03/13 0225 12/04/13 0236  WBC 11.7* 11.8* 13.5*  HGB 11.3* 11.9* 12.6*  HCT 35.9* 38.0* 40.5  PLT 264 293 282   Coag's  Recent Labs Lab 12/02/13 0230 12/03/13 0225 12/04/13 0236  INR 3.97* 3.54* 3.34*   BMET  Recent Labs Lab 12/02/13 0230 12/03/13 0225 12/04/13 0236  NA 141 141 141  K 4.7 4.6 4.6  CL 99 100 96  CO2 30 27 34*  BUN 30* 33* 33*  CREATININE 0.92 0.92 0.94  GLUCOSE 91 111* 97   Electrolytes  Recent Labs Lab 12/02/13 0230 12/03/13 0225 12/04/13 0236  CALCIUM 8.9 9.3 9.6   Sepsis Markers No results found for this basename: LATICACIDVEN, PROCALCITON, O2SATVEN,  in the last 168 hours ABG  Recent Labs Lab 12/04/13 0330  PHART 7.371  PCO2ART 61.6*  PO2ART 79.0*   Liver Enzymes No results found for this basename: AST, ALT, ALKPHOS, BILITOT, ALBUMIN,  in the last 168 hours Cardiac Enzymes No results found for this basename: TROPONINI, PROBNP,  in the last 168 hours Glucose No results found for this basename: GLUCAP,  in the last 168 hours  Imaging Dg Chest 2 View  12/02/2013   CLINICAL DATA:  Acute respiratory failure with hypoxemia  EXAM: CHEST  2 VIEW  COMPARISON:  Portable chest x-ray of December 02, 2013 at 5:30 a.m. ; also PA and lateral chest x-ray of August 02, 2013.  FINDINGS: The right hemidiaphragm remains higher than the left. There remain increased interstitial densities in both lungs which are slightly more confluent at the right lung base. There is pleural fluid blunting portions of the posterior costophrenic gutters bilaterally. The cardiac silhouette is enlarged. The central pulmonary vascularity is prominent.  IMPRESSION: Chronic pulmonary fibrotic change with mildly increased interstitial densities bilaterally especially at the right lung base. There has not been dramatic interval change since earlier today. Overall the appearance of the chest is slightly worse than on the  study from June 2015 suggesting acute superimposed interstitial edema or pneumonia.   Electronically Signed   By: David  Martinique   On: 12/02/2013 14:17   Dg Chest Port 1 View  12/04/2013   CLINICAL DATA:  Shortness of Breath  EXAM: PORTABLE CHEST - 1 VIEW  COMPARISON:  12/02/2013  FINDINGS: Postoperative changes in the mediastinum. Cardiac enlargement with mild pulmonary vascular congestion. Diffuse interstitial pattern consistent with history of chronic fibrosis. Superimposed infiltration in the perihilar and basilar regions suggesting superimposed edema. Fluid or thickened pleura in the right costophrenic angle. No change since previous study.  IMPRESSION: Cardiac enlargement with pulmonary vascular congestion and pulmonary edema. Fluid or thickened pleura in the costophrenic angle on the right. Underlying fibrosis. No change since previous.   Electronically Signed   By: Lucienne Capers M.D.   On: 12/04/2013 04:12     ASSESSMENT / PLAN:  PULMONARY A: Severe centrilobular emphysema Acute hypoxemic respiratory failure due to atelectasis and pulm edema > now near dry weight but has persistent hypoxemia worse than baseline NSCLC s/p resection Hemoptysis from anticoagulation worsening status 10/24 overnight, from blood? DAH P:   Will CT chest without contrast today Continue spiriva Continue CPAP at bedtime. Required BIPAP, keep as PRN, not required daytime scheduled as I see so far PRN albuterol. Use budesonide / brovana while hospitalized. Continue to keep net negative per cards May need to correct coags slight Dc coumadin abg reviewed no repeat needed  CARDIOVASCULAR A:  Acute on chronic systolic heart failure A.fib rate controlled Mechanical aortic valve P:  Cardiology following / managing. Dc coumadin. Would continue to keep slightly net negative as able  NEUROLOGIC A:   Deconditioning Anxiety Worsening status P:   Limit ambulation with worsening hypoxia Ct head abg re  assuring 'keep sats greater 92%  Family updated: Wife updated at bedside 10/25; I explained that he has severe lung disease. For CT , may need ffp, may have maximized lasix   Interdisciplinary Family Meeting v Palliative Care Meeting:  Would recommend palliative care consultation. I will also start this discussion  Lavon Paganini. Titus Mould, MD, [place] Pgr: [facility]

## 2013-12-04 NOTE — Progress Notes (Signed)
Patient Name: John [name] Date of Encounter: 12/04/2013     Principal Problem:   CHF (congestive heart failure) Active Problems:   Heart failure   Acute respiratory failure with hypoxia   Other emphysema   Protein-calorie malnutrition, severe    SUBJECTIVE  Patient is more dyspneic this am.  He had a rough night.  Heart rate controlled on diltiazem and metoprolol however therapy is limited by bradycardia.  Peripheral edema much improved according to wife.  He had cough with yellow sputum and some blood again overnight.   CURRENT MEDS . allopurinol  100 mg Oral Daily  . antiseptic oral rinse  7 mL Mouth Rinse BID  . arformoterol  15 mcg Nebulization BID  . aspirin EC  81 mg Oral Daily  . budesonide  0.5 mg Nebulization BID  . colchicine  0.6 mg Oral Daily  . diltiazem  120 mg Oral BID  . feeding supplement (ENSURE COMPLETE)  237 mL Oral BID BM  . furosemide  120 mg Intravenous BID  . guaiFENesin  600 mg Oral BID  . levalbuterol  0.63 mg Nebulization Q6H  . lisinopril  2.5 mg Oral Daily  . metoprolol  50 mg Oral BID  . sodium chloride  3 mL Intravenous Q12H  . tiotropium  18 mcg Inhalation Daily  . Warfarin - Pharmacist Dosing Inpatient   Does not apply q1800    OBJECTIVE  Filed Vitals:   12/04/13 0956 12/04/13 1000 12/04/13 1013 12/04/13 1021  BP: 103/52 95/44  81/50  Pulse: 103 104  97  Temp:      TempSrc:      Resp: 30 37  35  Height:      Weight:      SpO2: 89% 92% 96% 97%    Intake/Output Summary (Last 24 hours) at 12/04/13 1111 Last data filed at 12/04/13 1000  Gross per 24 hour  Intake    602 ml  Output   1250 ml  Net   -648 ml   Filed Weights   12/02/13 0303 12/03/13 0300 12/04/13 0304  Weight: 168 lb 14 oz (76.6 kg) 164 lb 3.9 oz (74.5 kg) 169 lb 5 oz (76.8 kg)    PHYSICAL EXAM  General: Pleasant, ill appearing. Nasal oxygen in place. Neuro: Alert and oriented X 3. Moves all extremities spontaneously. Psych: flat affect. HEENT:  OP  clear, wearing O2  Neck: Supple, + elevated JVP Lungs: coarse BS at the bases Heart: iiRRR Abdomen: Soft, non-tender, non-distended, BS + x 4.  Extremities: No clubbing, cyanosis. + dependant edema (chronic) with venous stasis changes DP/PT/Radials 2+ and equal bilaterally.   Accessory Clinical Findings  CBC  Recent Labs  12/03/13 0225 12/04/13 0236  WBC 11.8* 13.5*  HGB 11.9* 12.6*  HCT 38.0* 40.5  MCV 95.2 96.7  PLT 293 096   Basic Metabolic Panel  Recent Labs  12/03/13 0225 12/04/13 0236  NA 141 141  K 4.6 4.6  CL 100 96  CO2 27 34*  GLUCOSE 111* 97  BUN 33* 33*  CREATININE 0.92 0.94  CALCIUM 9.3 9.6  Fasting Lipid Panel No results found for this basename: CHOL, HDL, LDLCALC, TRIG, CHOLHDL, LDLDIRECT,  in the last 72 hours Thyroid Function Tests No results found for this basename: TSH, T4TOTAL, FREET3, T3FREE, THYROIDAB,  in the last 72 hours  TELE  Atrial fibrillation with V rate 100-120   Radiology/Studies  Dg Chest Portable 1 View  11/30/2013   CLINICAL DATA:  Initial  evaluation for shortness of breath 1 week, worse today, current history of COPD  EXAM: PORTABLE CHEST - 1 VIEW  COMPARISON:  08/02/2013  FINDINGS: Moderately severe cardiac enlargement stable from prior study. Mild vascular congestion. Mild diffuse interstitial prominence. There is elevation of the right diaphragm with right base opacity similar to prior study suggesting scarring or atelectasis. There is emphysematous change in the upper lung zones.  IMPRESSION: COPD with chronic scarring in the right base and chronic cardiac enlargement. However, increased interstitial opacities in the mid to lower lung zones currently suggest the possibility of mild cardiogenic interstitial pulmonary edema.   Electronically Signed   By: Skipper Cliche M.D.   On: 12/08/2013 17:08    ASSESSMENT AND PLAN 1. 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 - strict in and outs - he is slowly diuresing.  - daily weights  Given elevated JVP and poor clinical improvement, I would continue IV diuresis  2.  Chronic lung disease / chronic respiratory failure  - optimize pulm tx - start on spiriva and symbicort.  - nebs prn. No need for high dose steroid at this time.  - cont 3L oxygen per home regiment ,his breathing has improved.  I am concerned that he has more advanced respiratory failure.  I spoke with Dr Titus Mould this am who agrees that prognosis is poor.  Chest CT ordered  3. S/p aortic valve replacement - with mechanical valve  - INR goal 2.5  - Pharmacy consult placed for continuation of coumadin  - given hemoptysis, will hold coumadin for now until INR is closer to 2.5  4.  Chronic AF  - rate control limited by hypotension - cont BB and CCB at this time.  - cont [place] therapy  5. Mobility:  PT to follow  6.  Severe malnutrition:  Has been assessed by nutritionist.   Getting ensure.   7. Altered mental status:  Improved today  His prognosis is very poor. I had a long discussion with him today regarding his poor prognosis.  He is now willing to be DNR/DNI.  I will consult palliative care for further discussions regarding goals of care.  The patient is critically ill with multiple organ systems failure and requires high complexity decision making for assessment and support, frequent evaluation and titration of therapies, application of advanced monitoring technologies and extensive interpretation of multiple databases.   Total CCT spent directly with the patient today is 40 minutes

## 2013-12-04 NOTE — Progress Notes (Signed)
12/04/2013 1910  Talked with Dr. Tamala Julian at [place] regarding BP. Orders to be placed for hydration. Caroline Matters, Carolynn Comment

## 2013-12-04 NOTE — Progress Notes (Signed)
Patient fighting against Bi Pap and getting very Tachypneic Resp 36 to 43. RT notified and took patient off Bi-Pap and placed on NRB. Xanax 0.5mg  given PO for anxiety. VSS showing axillary temp of 100.3. E Link notified of patients changing status. Orders received from Mauri Brooklyn MD for foley cath, ABG's, CXR and place patient back on Bi-Pap. RT in room to reset patient back on Bipap with slight changes in settings. Patient tolerating better. Will check ABG results.

## 2013-12-05 ENCOUNTER — Inpatient Hospital Stay (HOSPITAL_COMMUNITY): Payer: Medicare Other

## 2013-12-05 DIAGNOSIS — R0600 Dyspnea, unspecified: Secondary | ICD-10-CM

## 2013-12-05 DIAGNOSIS — I5021 Acute systolic (congestive) heart failure: Secondary | ICD-10-CM

## 2013-12-05 DIAGNOSIS — R0902 Hypoxemia: Secondary | ICD-10-CM

## 2013-12-05 DIAGNOSIS — R410 Disorientation, unspecified: Secondary | ICD-10-CM

## 2013-12-05 DIAGNOSIS — J189 Pneumonia, unspecified organism: Secondary | ICD-10-CM | POA: Diagnosis present

## 2013-12-05 DIAGNOSIS — I359 Nonrheumatic aortic valve disorder, unspecified: Secondary | ICD-10-CM

## 2013-12-05 DIAGNOSIS — J438 Other emphysema: Secondary | ICD-10-CM

## 2013-12-05 DIAGNOSIS — J410 Simple chronic bronchitis: Secondary | ICD-10-CM

## 2013-12-05 DIAGNOSIS — J80 Acute respiratory distress syndrome: Secondary | ICD-10-CM

## 2013-12-05 DIAGNOSIS — R042 Hemoptysis: Secondary | ICD-10-CM

## 2013-12-05 DIAGNOSIS — Z515 Encounter for palliative care: Secondary | ICD-10-CM

## 2013-12-05 LAB — BASIC METABOLIC PANEL
Anion gap: 12 (ref 5–15)
BUN: 42 mg/dL — ABNORMAL HIGH (ref 6–23)
CO2: 28 mEq/L (ref 19–32)
CREATININE: 1.01 mg/dL (ref 0.50–1.35)
Calcium: 8.6 mg/dL (ref 8.4–10.5)
Chloride: 98 mEq/L (ref 96–112)
GFR, EST AFRICAN AMERICAN: 80 mL/min — AB (ref >90)
GFR, EST NON AFRICAN AMERICAN: 69 mL/min — AB (ref >90)
Glucose, Bld: 126 mg/dL — ABNORMAL HIGH (ref 70–99)
Potassium: 4.9 mEq/L (ref 3.7–5.3)
SODIUM: 138 meq/L (ref 137–147)

## 2013-12-05 LAB — CBC
HCT: 33.3 % — ABNORMAL LOW (ref 39.0–52.0)
Hemoglobin: 10.4 g/dL — ABNORMAL LOW (ref 13.0–17.0)
MCH: 30.3 pg (ref 26.0–34.0)
MCHC: 31.2 g/dL (ref 30.0–36.0)
MCV: 97.1 fL (ref 78.0–100.0)
PLATELETS: 226 10*3/uL (ref 150–400)
RBC: 3.43 MIL/uL — ABNORMAL LOW (ref 4.22–5.81)
RDW: 16.8 % — AB (ref 11.5–15.5)
WBC: 20.2 10*3/uL — ABNORMAL HIGH (ref 4.0–10.5)

## 2013-12-05 LAB — PREPARE FRESH FROZEN PLASMA: Unit division: 0

## 2013-12-05 LAB — PROTIME-INR
INR: 4.17 — ABNORMAL HIGH (ref 0.00–1.49)
PROTHROMBIN TIME: 40.6 s — AB (ref 11.6–15.2)

## 2013-12-05 MED ORDER — MORPHINE SULFATE 2 MG/ML IJ SOLN: 2.0000 mg | INTRAMUSCULAR | Status: DC | PRN

## 2013-12-05 MED ORDER — CALCIUM CARBONATE ANTACID 500 MG PO CHEW
1.0000 | CHEWABLE_TABLET | Freq: Three times a day (TID) | ORAL | Status: DC | PRN
  Administered 2013-12-05 – 2013-12-08 (×2): 200 mg via ORAL
  Filled 2013-12-05 (×2): qty 1

## 2013-12-05 MED ORDER — PHYTONADIONE 1 MG/0.5 ML ORAL SOLUTION
1.0000 mg | Freq: Once | ORAL | Status: AC
  Administered 2013-12-05: 1 mg via ORAL
  Filled 2013-12-05: qty 0.5

## 2013-12-05 MED ORDER — PANTOPRAZOLE SODIUM 40 MG PO TBEC
40.0000 mg | DELAYED_RELEASE_TABLET | Freq: Every day | ORAL | Status: DC
  Administered 2013-12-05 – 2013-12-08 (×3): 40 mg via ORAL
  Filled 2013-12-05 (×4): qty 1

## 2013-12-05 MED ORDER — MORPHINE SULFATE (CONCENTRATE) 10 MG /0.5 ML PO SOLN
5.0000 mg | ORAL | Status: DC | PRN
  Administered 2013-12-06: 5 mg via SUBLINGUAL
  Filled 2013-12-05: qty 0.5

## 2013-12-05 MED ORDER — MORPHINE SULFATE 2 MG/ML IJ SOLN
1.0000 mg | INTRAMUSCULAR | Status: DC | PRN
  Administered 2013-12-06 – 2013-12-09 (×8): 2 mg via INTRAVENOUS
  Filled 2013-12-05 (×8): qty 1

## 2013-12-05 NOTE — Progress Notes (Signed)
Subjective:  Alert, oriented. Denies CP. On NRB 100% FIO2  Objective:  Temp:  [96.6 F (35.9 C)-100.1 F (37.8 C)] 98 F (36.7 C) (10/26 0725) Pulse Rate:  [41-108] 94 (10/26 0830) Resp:  [18-49] 31 (10/26 0830) BP: (63-111)/(44-74) 93/60 mmHg (10/26 0800) SpO2:  [87 %-100 %] 92 % (10/26 0902) FiO2 (%):  [80 %-100 %] 80 % (10/26 0416) Weight:  [170 lb 6.7 oz (77.3 kg)] 170 lb 6.7 oz (77.3 kg) (10/26 0300) Weight change: 1 lb 1.6 oz (0.5 kg)  Intake/Output from previous day: 10/25 0701 - 10/26 0700 In: 2670 [P.O.:240; I.V.:1400; Blood:180; IV Piggyback:850] Out: 1175 [Urine:1175]  Intake/Output from this shift: Total I/O In: 75 [I.V.:75] Out: -   Physical Exam: General appearance: alert and no distress Neck: no adenopathy, no carotid bruit, supple, symmetrical, trachea midline, thyroid not enlarged, symmetric, no tenderness/mass/nodules and Increased JVP to angle of the JAW Lungs: clear to auscultation bilaterally Heart: irregularly irregular rhythm Extremities: extremities normal, atraumatic, no cyanosis or edema  Lab Results: Results for orders placed during the hospital encounter of 11/25/2013 (from the past 48 hour(s))  PROTIME-INR     Status: Abnormal   Collection Time    12/04/13  2:36 AM      Result Value Ref Range   Prothrombin Time 34.1 (*) 11.6 - 15.2 seconds   INR 3.34 (*) 0.00 - 1.49  BASIC METABOLIC PANEL     Status: Abnormal   Collection Time    12/04/13  2:36 AM      Result Value Ref Range   Sodium 141  137 - 147 mEq/L   Potassium 4.6  3.7 - 5.3 mEq/L   Chloride 96  96 - 112 mEq/L   CO2 34 (*) 19 - 32 mEq/L   Glucose, Bld 97  70 - 99 mg/dL   BUN 33 (*) 6 - 23 mg/dL   Creatinine, Ser 0.94  0.50 - 1.35 mg/dL   Calcium 9.6  8.4 - 10.5 mg/dL   GFR calc non Af Amer 78 (*) >90 mL/min   GFR calc Af Amer >90  >90 mL/min   Comment: (NOTE)     The eGFR has been calculated using the CKD EPI equation.     This calculation has not been validated in all  clinical situations.     eGFR's persistently <90 mL/min signify possible Chronic Kidney     Disease.   Anion gap 11  5 - 15  CBC     Status: Abnormal   Collection Time    12/04/13  2:36 AM      Result Value Ref Range   WBC 13.5 (*) 4.0 - 10.5 K/uL   RBC 4.19 (*) 4.22 - 5.81 MIL/uL   Hemoglobin 12.6 (*) 13.0 - 17.0 g/dL   HCT 40.5  39.0 - 52.0 %   MCV 96.7  78.0 - 100.0 fL   MCH 30.1  26.0 - 34.0 pg   MCHC 31.1  30.0 - 36.0 g/dL   RDW 16.5 (*) 11.5 - 15.5 %   Platelets 282  150 - 400 K/uL  BLOOD GAS, ARTERIAL     Status: Abnormal   Collection Time    12/04/13  3:30 AM      Result Value Ref Range   FIO2 0.90     Delivery systems BILEVEL POSITIVE AIRWAY PRESSURE     Inspiratory PAP 12     Expiratory PAP 6     pH, Arterial 7.371  7.350 -  7.450   pCO2 arterial 61.6 (*) 35.0 - 45.0 mmHg   Comment: CRITICAL RESULT CALLED TO, READ BACK BY AND VERIFIED WITH:     ANN DILLARD, RN AT 0350 BY CMCCOLLUM ON 12/04/2013   pO2, Arterial 79.0 (*) 80.0 - 100.0 mmHg   Bicarbonate 34.4 (*) 20.0 - 24.0 mEq/L   TCO2 36.2  0 - 100 mmol/L   Acid-Base Excess 9.2 (*) 0.0 - 2.0 mmol/L   O2 Saturation 93.9     Patient temperature 100.5     Collection site RIGHT RADIAL     Drawn by [phone]     Sample type ARTERIAL DRAW     Allens test (pass/fail) PASS  PASS  PREPARE FRESH FROZEN PLASMA     Status: None   Collection Time    12/04/13  2:00 PM      Result Value Ref Range   Unit Number [id number]     Blood Component Type THAWED PLASMA     Unit division 00     Status of Unit ISSUED     Transfusion Status OK TO TRANSFUSE    CULTURE, EXPECTORATED SPUTUM-ASSESSMENT     Status: None   Collection Time    12/04/13  2:05 PM      Result Value Ref Range   Specimen Description SPUTUM     Special Requests NONE     Sputum evaluation       Value: THIS SPECIMEN IS ACCEPTABLE. RESPIRATORY CULTURE REPORT TO FOLLOW.   Report Status 12/04/2013 FINAL    PROTIME-INR     Status: Abnormal   Collection Time     12/05/13  2:33 AM      Result Value Ref Range   Prothrombin Time 40.6 (*) 11.6 - 15.2 seconds   INR 4.17 (*) 0.00 - 3.78  BASIC METABOLIC PANEL     Status: Abnormal   Collection Time    12/05/13  2:33 AM      Result Value Ref Range   Sodium 138  137 - 147 mEq/L   Potassium 4.9  3.7 - 5.3 mEq/L   Chloride 98  96 - 112 mEq/L   CO2 28  19 - 32 mEq/L   Glucose, Bld 126 (*) 70 - 99 mg/dL   BUN 42 (*) 6 - 23 mg/dL   Creatinine, Ser 1.01  0.50 - 1.35 mg/dL   Calcium 8.6  8.4 - 10.5 mg/dL   GFR calc non Af Amer 69 (*) >90 mL/min   GFR calc Af Amer 80 (*) >90 mL/min   Comment: (NOTE)     The eGFR has been calculated using the CKD EPI equation.     This calculation has not been validated in all clinical situations.     eGFR's persistently <90 mL/min signify possible Chronic Kidney     Disease.   Anion gap 12  5 - 15  CBC     Status: Abnormal   Collection Time    12/05/13  2:33 AM      Result Value Ref Range   WBC 20.2 (*) 4.0 - 10.5 K/uL   RBC 3.43 (*) 4.22 - 5.81 MIL/uL   Hemoglobin 10.4 (*) 13.0 - 17.0 g/dL   Comment: DELTA CHECK NOTED     REPEATED TO VERIFY   HCT 33.3 (*) 39.0 - 52.0 %   MCV 97.1  78.0 - 100.0 fL   MCH 30.3  26.0 - 34.0 pg   MCHC 31.2  30.0 - 36.0 g/dL   RDW  16.8 (*) 11.5 - 15.5 %   Platelets 226  150 - 400 K/uL    Imaging: Imaging results have been reviewed  Tele: AFIB with CVR   Assessment/Plan:   1. Principal Problem: 2.   CHF (congestive heart failure) 3. Active Problems: 4.   Heart failure 5.   Acute respiratory failure with hypoxia 6.   Other emphysema 7.   Protein-calorie malnutrition, severe 8.   Time Spent Directly with Patient:  25 minutes  Length of Stay:  LOS: 9 days   Pt admitted 10/17 with respiratory failure. He has CHF on CXR but BNP only 2357. He was on lasix 80 mg PO BID at home which was switched to 120 mg IV BID on admission (Currently on hold secondary to decreased BP), I/O -3L. EF 40% by 2D with severe PHTN . On BB and  CCB for rate control being held secondary to low BP. His INRs were supra therapeutic (4.17) with hemoptysis (coumadin on hold). Pulm following as well, on ATBX, steroids and pulm bronchodilators. He is currently on NRB 100%. Palliative Care consult ordered which is appropriate. Dr. Burt Knack follows as OP. His wife is very knowledgeable about his care and they agree with DNR/DNI and Palliative Care.   Lorretta Harp 12/05/2013, 9:35 AM

## 2013-12-05 NOTE — Progress Notes (Signed)
PT Cancellation Note  Patient Details Name: John [name] MRN: [id number] DOB: August 11, 1933   Cancelled Treatment:    Reason Eval/Treat Not Completed: Medical issues which prohibited therapy   [name] 12/05/2013, 10:57 AM

## 2013-12-05 NOTE — Progress Notes (Signed)
PULMONARY / CRITICAL CARE MEDICINE   Name: John [name] MRN: [id number] DOB: 02-Sep-1933    ADMISSION DATE:  11/19/2013 CONSULTATION DATE:  12/05/2013  REFERRING MD :  Murvin Natal  CHIEF COMPLAINT:  SOB  INITIAL PRESENTATION: 80 y.o. M followed by RB for COPD and NCSLC s/p resection brought to [facility] ED on 10/17 with SOB.  Was placed on Levaquin roughly 1 week prior for PNA.  Became hypoxic to 84% despite his normal 3L [place].  In ED, was found to have CHF exacerbation and was subsequently admitted.  PCCM was consulted at the request of pt.  STUDIES:  CXR 10/17 >>> COPD with chronic scarring in right base.  Increased interstitial opacities in mid to lower lung zones, possibly from cardiogenic interstitial pulmonary edema. Echo 7/22 >>> EF 40-45%, diffuse hypokinesis, RV overload, mechanical AV, severely dilated LA, mod - severe TR, PAP 72.  SIGNIFICANT EVENTS: 10/17 - admit 10/20 - PCCM consult 10/24- BIPAP needed overnight, 100% NRB this am   SUBJECTIVE:  Some bright red blood with coughing overnight requiring FFP, BiPAP overnight.  Also hypotension overnight requiring fluid.  VITAL SIGNS: Temp:  [96.6 F (35.9 C)-100.1 F (37.8 C)] 98 F (36.7 C) (10/26 0725) Pulse Rate:  [41-108] 105 (10/26 1000) Resp:  [18-49] 39 (10/26 1000) BP: (63-111)/(47-78) 109/78 mmHg (10/26 1000) SpO2:  [87 %-100 %] 100 % (10/26 1000) FiO2 (%):  [80 %] 80 % (10/26 0416) Weight:  [77.3 kg (170 lb 6.7 oz)] 77.3 kg (170 lb 6.7 oz) (10/26 0300) HEMODYNAMICS:   VENTILATOR SETTINGS: Vent Mode:  [-]  FiO2 (%):  [80 %] 80 % INTAKE / OUTPUT: Intake/Output     10/25 0701 - 10/26 0700 10/26 0701 - 10/27 0700   P.O. 240 100   I.V. (mL/kg) 1400 (18.1) 225 (2.9)   Blood 180    IV Piggyback 850    Total Intake(mL/kg) 2670 (34.5) 325 (4.2)   Urine (mL/kg/hr) 1175 (0.6)    Total Output 1175     Net +1495 +325        Stool Occurrence 3 x      PHYSICAL EXAMINATION: General: chronically ill appearing,no acute  distress but on 100% HEENT: NCAT, jvd down PULM; coarse CV: Irreg irreg, systolic murmur AB: BS+, soft, nontender Ext: warm trace edema Neuro: A&Ox4, maeaw  LABS:  CBC  Recent Labs Lab 12/03/13 0225 12/04/13 0236 12/05/13 0233  WBC 11.8* 13.5* 20.2*  HGB 11.9* 12.6* 10.4*  HCT 38.0* 40.5 33.3*  PLT 293 282 226   Coag's  Recent Labs Lab 12/03/13 0225 12/04/13 0236 12/05/13 0233  INR 3.54* 3.34* 4.17*   BMET  Recent Labs Lab 12/03/13 0225 12/04/13 0236 12/05/13 0233  NA 141 141 138  K 4.6 4.6 4.9  CL 100 96 98  CO2 27 34* 28  BUN 33* 33* 42*  CREATININE 0.92 0.94 1.01  GLUCOSE 111* 97 126*   Electrolytes  Recent Labs Lab 12/03/13 0225 12/04/13 0236 12/05/13 0233  CALCIUM 9.3 9.6 8.6   Sepsis Markers No results found for this basename: LATICACIDVEN, PROCALCITON, O2SATVEN,  in the last 168 hours ABG  Recent Labs Lab 12/04/13 0330  PHART 7.371  PCO2ART 61.6*  PO2ART 79.0*   Liver Enzymes No results found for this basename: AST, ALT, ALKPHOS, BILITOT, ALBUMIN,  in the last 168 hours Cardiac Enzymes No results found for this basename: TROPONINI, PROBNP,  in the last 168 hours Glucose No results found for this basename: GLUCAP,  in the last  168 hours  Imaging Ct Head Wo Contrast  12/04/2013   CLINICAL DATA:  Hypoxia with worsening neurological exam.  EXAM: CT HEAD WITHOUT CONTRAST  TECHNIQUE: Contiguous axial images were obtained from the base of the skull through the vertex without intravenous contrast.  COMPARISON:  None.  FINDINGS: Rounded hypodensity in the right caudate head suggests prior lacunar infarction. Negative for intra or extra-axial hemorrhage, mass effect, mass lesion, or evidence of acute cortically based infarction. Intracranial vascular calcifications are noted.  Mucosal thickening and air-fluid noted in the right frontal sinus. Mucosal thickening or fluid scattered in several ethmoid air cells. Maxillary sinuses and sphenoid  sinuses are clear. Mastoid air cells and middle ears are clear. Skull is intact. Soft tissues of the scalp and orbits appear symmetric.  IMPRESSION: 1. 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 2. No definite acute intracranial abnormality identified. 3. Right frontal and ethmoid air cell sinus disease.   Electronically Signed   By: Curlene Dolphin M.D.   On: 12/04/2013 12:13   Ct Chest Wo Contrast  12/04/2013   CLINICAL DATA:  Continued hypoxia.  CHF exacerbation.  Hemoptysis.  EXAM: CT CHEST WITHOUT CONTRAST  TECHNIQUE: Multidetector CT imaging of the chest was performed following the standard protocol without IV contrast.  COMPARISON:  Chest radiograph 12/04/2013 and CT chest 02/25/2013  FINDINGS: There is some respiratory motion on these images. Mediastinal lymphadenopathy is present. A precarinal lymph node measures up to 1.6 cm AP diameter. Right paratracheal lymph node measures 11 mm short axis. There additional reactive size lymph nodes. This lymphadenopathy was not present on chest CT 02/25/2013. Evaluation of the hilar structures is limited without intravenous contrast.  Cardiomegaly with biatrial enlargement appears stable. Prior median sternotomy for aortic valve replacement.  Moderate size left and small right pleural effusion. Negative for pericardial effusion.  Lung windows demonstrate advanced centrilobular emphysema. There are postsurgical changes of prior right lower lobectomy. There is fluid/secretions obstructing the right middle lobe bronchus (image number 32 of the lung windows). This is new compared to prior CT.  On prior CT 02/25/2013, the right middle lobe was hyperinflated and clear. Currently, the right middle lobe is completely opacified with predominately soft tissue density: images 30 through 40 of the lung and soft tissue windows.  There is peribronchial thickening and patchy airspace disease in the posterior right lung base, in the right upper lobe.  There is atelectasis  and/or consolidation in the left lower lobe, overlying the left pleural effusion.  No acute or suspicious bony abnormality.  Small amount of upper abdominal ascites.  Probable gallstones noted in the superior aspect of the imaged portion of the gallbladder.  IMPRESSION: 1. The dominant abnormality is complete opacification of the right middle lobe, which was previously hyperexpanded and clear on the CT of 02/25/2013. Right middle lobe is predominantly filled with soft tissue density, and there is an air-fluid level, with near obstruction of the right middle lobe bronchus. Findings could be due to consolidation of the right middle lobe due to the right middle bronchus obstructed by fluid/secretions. Pneumonia involving the right middle lobe is a possibility. The possibility of tumor involving the right middle lobe cannot be excluded, especially given its completely consolidative appearance. Close clinical and chest CT follow-up is suggested. 2. Airspace disease in the posterior right lung base (the inferior aspect of the patient's right upper lobe). Prior right lower lobectomy. 3. Mediastinal lymphadenopathy, new. 4. Bilateral pleural effusions, left greater than right, with probable atelectasis overlying the  left pleural effusion (left basilar airspace disease not excluded). 5. Advanced emphysema. 6. Cardiomegaly prior aortic valve replacement. 7. Mild abdominal ascites.   Electronically Signed   By: Curlene Dolphin M.D.   On: 12/04/2013 12:49   Dg Chest Port 1 View  12/05/2013   CLINICAL DATA:  COPD.  EXAM: PORTABLE CHEST - 1 VIEW  COMPARISON:  CT chest 12/04/2013.  Chest x-ray 12/04/2013.  FINDINGS: Prior median sternotomy and CABG. Cardiomegaly with bilateral pulmonary infiltrates and pleural effusions are present consistent with congestive heart failure. No pneumothorax. No acute osseous abnormality.  IMPRESSION: 1. Findings on today's exam consistent with congestive heart failure with bilateral pulmonary  edema and small bilateral pleural effusions. Underlying pneumonia cannot be excluded. 2. Prior CABG.   Electronically Signed   By: Marcello Moores  Register   On: 12/05/2013 07:55   Dg Chest Port 1 View  12/04/2013   CLINICAL DATA:  Shortness of Breath  EXAM: PORTABLE CHEST - 1 VIEW  COMPARISON:  12/02/2013  FINDINGS: Postoperative changes in the mediastinum. Cardiac enlargement with mild pulmonary vascular congestion. Diffuse interstitial pattern consistent with history of chronic fibrosis. Superimposed infiltration in the perihilar and basilar regions suggesting superimposed edema. Fluid or thickened pleura in the right costophrenic angle. No change since previous study.  IMPRESSION: Cardiac enlargement with pulmonary vascular congestion and pulmonary edema. Fluid or thickened pleura in the costophrenic angle on the right. Underlying fibrosis. No change since previous.   Electronically Signed   By: Lucienne Capers M.D.   On: 12/04/2013 04:12     ASSESSMENT / PLAN:  PULMONARY A: Severe centrilobular emphysema Acute hypoxemic respiratory failure due to atelectasis and pulm edema > now near dry weight but has persistent hypoxemia worse than baseline NSCLC s/p resection Hemoptysis from anticoagulation worsening status 10/24 overnight, from blood? DAH P:   Chest CT with likely recurrence of cancer and LAN and post obstructive PNA Continue spiriva Continue CPAP at bedtime. Required BIPAP, keep as PRN, not required daytime scheduled. Chest PT. High flow cannula for comfort from mask DNR PRN albuterol. Use budesonide / brovana while hospitalized. KVO IVF, needs lasix when BP permits. Dc coumadin, FFP given overnight for hemoptysis, if recurs then will give more.   Vit K.  CARDIOVASCULAR A:  Acute on chronic systolic heart failure A.fib rate controlled Mechanical aortic valve P:  Cardiology following / managing. Dc coumadin. Vit K D/C cardizem and metoprolol given hypotension KVO  IVF  NEUROLOGIC A:   Deconditioning Anxiety P:   Limit ambulation with worsening hypoxia Keep sats 85-88%  Family updated: Patient and Wife updated at bedside 10/26; extensive discussion regarding CT findings, PNA and code status.  Now a full DNR, will leave orders for morphine as PRN, if deteriorates then will transition to comfort care.  Appreciate input from palliative care.  Interdisciplinary Family Meeting v Palliative Care Meeting:  Palliative following, patient deteriorating.  Limited options.  If worsen then full comfort.  CC time 35 min.  Rush Farmer, M.D. [facility] Pulmonary/Critical Care Medicine. Pager: [phone]. After hours pager: [phone].

## 2013-12-05 NOTE — Consult Note (Signed)
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 Prior to admission he was active despite being quite limited by his chronic medical problems. Since his lung cancer resection 4 years ago he and his wife have made his disease management a way of life-they even moved [place] to [place] into a one level town house that is handicap accessible to be closer to his doctors, they have a strict low sodium diet and usually have a least one-two doctors appointments each week. They monitor his oxygen saturations at home and his vital signs.  His wife reports that over the course of the last few weeks he has been getting weaker and more short of breath, decreased appetite and worsening LE edema- outpatient attempts to manage this were unsuccessful.He was put on Levaquin for cellulitis in his Lower extremities and his diuretics were adjusted without relief. He got confused and more SOB at home on 10/17 and they called 911 for him to come to the ER.   He has continued to decline in teh hospital and a CT done on 10/25 suggests Lung Cancer recurrence and significant PNA.  Participants in Discussion: Wife and  Patient   Advance Directive: none   Code Status Orders        Start     Ordered   12/04/13 1110  Do not attempt resuscitation (DNR)   Continuous    Question Answer Comment  In the event of cardiac or respiratory ARREST Do not call a "code blue"   In the event of cardiac or respiratory ARREST Do not perform Intubation, CPR, defibrillation or ACLS   In the event of cardiac or respiratory ARREST Use medication by any route, position, wound care, and other measures to relive pain and suffering. May use oxygen, suction and manual treatment of airway obstruction as needed for comfort.      12/04/13 1110      I have reviewed the medical record, interviewed the patient and family, and examined the patient. The following aspects are pertinent.  Past Medical History  Diagnosis Date  . PAF (paroxysmal atrial fibrillation)     a. intolerant to amio (? lung toxicity) and tikosyn (prolonged QT);  b. 07/2012 s/p DCCV;  c. 09/2012 s/p DCCV - Multaq initiated.  . Aortic valve disorders     a. s/p AVR, 2006;  b. chronic coumadin.  . Other primary cardiomyopathies   . Edema     CHRONIC LOWER EXTREMITY  . Unspecified essential hypertension   . Other and unspecified hyperlipidemia   . Heart valve replaced by other means   . Other malaise and fatigue   . Coronary atherosclerosis of native coronary artery   . Acute myocardial infarction,  subendocardial infarction, subsequent episode of care   . Unspecified adverse effect of unspecified drug, medicinal and biological substance   . Umbilical hernia without mention of obstruction or gangrene   . Sebaceous cyst   . Labyrinthitis, unspecified   . Personal history of other diseases of digestive system   . Special screening for malignant neoplasm of prostate   . BC (bronchogenic carcinoma)     S/P LOBECTOMY 2011  . Shortness of breath   . COPD (chronic obstructive pulmonary disease)   . GERD (gastroesophageal reflux disease)    History   Social  History  . Marital Status: Married    Spouse Name: N/A    Number of Children: 1  . Years of Education: N/A   Occupational History  . [employer]     PHOTOGRAPHER   Social History Main Topics  . Smoking status: Former Smoker -- 1.00 packs/day for 40 years    Types: Cigarettes    Quit date: 02/10/2009  . Smokeless tobacco: Never Used  . Alcohol Use: Yes     Comment: socially "once in a while"  . Drug Use: No  . Sexual Activity: No   Other Topics Concern  . None   Social History Narrative   HSG, 2 years college. married '70 . 1 daughter  '70. work: Geophysicist/field seismologist, retired.   ACP - no living will - provided information at today's visit.    Family History  Problem Relation Age of Onset  . Colon cancer Father   . Aneurysm Mother     RUPTURE-NECK   . Coronary artery disease Mother    Scheduled Meds: . acetylcysteine  4 mL Nebulization BID  . allopurinol  100 mg Oral Daily  . antiseptic oral rinse  7 mL Mouth Rinse BID  . arformoterol  15 mcg Nebulization BID  . aspirin EC  81 mg Oral Daily  . budesonide  0.5 mg Nebulization BID  . colchicine  0.6 mg Oral Daily  . dexamethasone  4 mg Intravenous Q12H  . feeding supplement (ENSURE COMPLETE)  237 mL Oral BID BM  . guaiFENesin  600 mg Oral BID  . levalbuterol  0.63 mg Nebulization Q6H  . levofloxacin (LEVAQUIN) IV  750 mg Intravenous Q24H  . pantoprazole  40 mg Oral Q1200  . sodium chloride  3 mL Intravenous Q12H  . tiotropium  18 mcg Inhalation Daily  . vancomycin  1,000 mg Intravenous Q12H  . Warfarin - Pharmacist Dosing Inpatient   Does not apply q1800   Continuous Infusions:   PRN Meds:.sodium chloride, acetaminophen, albuterol, calcium carbonate, morphine injection, morphine CONCENTRATE, ondansetron (ZOFRAN) IV, sodium chloride Allergies  Allergen Reactions  . Benzodiazepines Other (See Comments)    Hallucinations  . Penicillins Hives, Swelling and Rash     facial swelling   CBC:    Component Value Date/Time    WBC 20.2* 12/05/2013 0233   HGB 10.4* 12/05/2013 0233   HCT 33.3* 12/05/2013 0233   PLT 226 12/05/2013 0233   MCV 97.1 12/05/2013 0233   NEUTROABS 13.6* 02/25/2013 1148   LYMPHSABS 2.0 02/25/2013 1148   MONOABS 0.9 02/25/2013 1148   EOSABS 0.1 02/25/2013 1148   BASOSABS 0.0 02/25/2013 1148   Comprehensive Metabolic Panel:    Component Value Date/Time   NA 138 12/05/2013 0233   K 4.9 12/05/2013 0233   CL 98 12/05/2013 0233   CO2 28 12/05/2013 0233   BUN 42* 12/05/2013 0233   CREATININE 1.01 12/05/2013 0233   GLUCOSE 126*  12/05/2013 0233   CALCIUM 8.6 12/05/2013 0233   AST 33 11/11/2013 1411   AST 33 11/11/2013 1411   ALT 14 11/11/2013 1411   ALT 14 11/11/2013 1411   ALKPHOS 91 11/11/2013 1411   ALKPHOS 91 11/11/2013 1411   BILITOT 1.5* 11/11/2013 1411   BILITOT 1.5* 11/11/2013 1411   PROT 7.8 11/11/2013 1411   PROT 7.8 11/11/2013 1411   ALBUMIN 4.1 11/11/2013 1411   ALBUMIN 4.1 11/11/2013 1411    Vital Signs: BP 112/65  Pulse 119  Temp(Src) 98.1 F (36.7 C) (Oral)  Resp 21  Ht 5\' 11"  (1.803 m)  Wt 77.3 kg (170 lb 6.7 oz)  BMI 23.78 kg/m2  SpO2 90% Filed Weights   12/03/13 0300 12/04/13 0304 12/05/13 0300  Weight: 74.5 kg (164 lb 3.9 oz) 76.8 kg (169 lb 5 oz) 77.3 kg (170 lb 6.7 oz)   10/26 0701 - 10/27 0700 In: 9628 [P.O.:580; I.V.:750; IV Piggyback:350] Out: 725 [Urine:725]  Physical Exam:  Chronically ill appearing, Frail, mild distress related to dyspnea He is AOX3, some mild confusion with details and word retrieval- some difficulty with comprehension but overall he has capacity and is an active participant in the discussion today. Diffuse Rhonchi, 3+LE edema and venous stasis dermatitis, deconditioned extremities-very weak.  Assessment: Mr. Talent is acutely and critically ill with respiratory failure which is likely multifactorial -he also has multiple life limiting and serious chronic medical problems that he has been managing for several years. He is very appropriate  for Palliative Care consultation.  Severe Dyspnea/Respirtaory Failure: I reviewed the CT findings from 10/25 with both of them, they did not have a good understanding of what those findings were or the implications of the results- there is very high suspicion of tumor recurrence at the old lobectomy site and several areas that are clearly consolidation from PNA-possible post obstructive PNA at [facility] or HCAP. His WBC count is elevated and he has had hemoptysis which would support PNA dx along with early sepsis hypotension. I explained that we would probably never know for sure if this was cancer recurrence and he is certainly not stable for a biospy or any treatment options if it were NSLC- will defer bronchoscopy option to Pulm. He also has very advanced chronic lung disease with scarring and empysema. He is currently euvolemic and his diuretics are being held due to hypotension. He has required BiPap and High Flow Nasal Cannula O2.  Delirium Improving today. Complication of his infection and over a week in the hospital-may also be medication induced- his wife requested that he not be given Xanax or Ativan because it "makes him hallucinate".  Failure to Thrive/Poor appetite  Related to his cardiopulmonary disease and possibly malignancy.  Venous Stasis Dermatitis and Anasarca from Cor Pulmonale Volume overloaded and nutritionally depleted-he has severe discomfort in his legs from edema-improved with diuresis.  Summary of Established Goals of Care and Medical Treatment Preferences  Mr. Likins and his wife understand the serious nature of his illness and his overall very poor prognosis- they are very much still processing the CT result findings of the possible lung cancer recurrence and severe PNA. They want to hope for the best but understand that he may not improve and may not survive this hospitalization-- ultimately their goal is to make it home again- I prepared them for the possibility that even  if he did survive this that he may have a new baseline and need considerably more assistance than PTA. He tells  me that his minimal acceptable QOL includes being able to get himself to the bathroom, to not have severe dyspnea at rest and to always remain at home. He tells me that he never wants to be burden on his wife. I have already introduced the concept of hospice care which would be very appropriate for him at home if he stabilizes and is able to survive his current condition.  1. DNR, confirmed. 2. No Bipap-this is extremely uncomfortable for him 3. Short term HFNC o2 ok for now while PNA is being treated 4. Roxanol or IV low dose morphine for severe dyspnea 5. He specifically request to have the Xanax discontinued- paradoxical reaction-if he needs a sedative will need to consider other options such as low dose haldol and trazadone. 6. Aggressive treatment of PNA with [employer] ABX- consider changing Levaquin to Azactam for full HCAP coverage 7. Monitor his diuresis and volume status closely- he is currently not on a diruetic but these will probably need to be resumed soon. 8. He complains about his Foley- he asks to have it removed and to be able to use a urinal-nursing order placed.  I discussed his care with Dr. Nelda Marseille, PCCM- based on the goals established I think it is reasonable to treat the newly discovered PNA aggressively for at least another 24 -48 hours to see if he has improvement. Our team will follow him closely and assist with symptoms and transitions of care.   Thank you for this consultation.  Time In: 9:30 Time Out: 11AM Time Total: 70 minutes at bedside Greater than 50%  of this time was spent counseling and coordinating care related to the above assessment and plan.  Signed by: Roma Schanz, DO  12/06/2013, 12:16 AM  Please contact Palliative Medicine Team phone at [phone] for questions and concerns.

## 2013-12-05 NOTE — Progress Notes (Signed)
ANTICOAGULATION CONSULT NOTE - Follow-Up Consult  Pharmacy Consult for Warfarin Indication: atrial fibrillation  Allergies  Allergen Reactions  . Penicillins Hives, Swelling and Rash     facial swelling    Patient Measurements: Height: 5\' 11"  (180.3 cm) Weight: 170 lb 6.7 oz (77.3 kg) IBW/kg (Calculated) : 75.3  Vital Signs: Temp: 98 F (36.7 C) (10/26 0725) Temp Source: Axillary (10/26 0725) BP: 93/60 mmHg (10/26 0800) Pulse Rate: 94 (10/26 0830)  Labs:  Recent Labs  12/03/13 0225 12/04/13 0236 12/05/13 0233  HGB 11.9* 12.6* 10.4*  HCT 38.0* 40.5 33.3*  PLT 293 282 226  LABPROT 35.7* 34.1* 40.6*  INR 3.54* 3.34* 4.17*  CREATININE 0.92 0.94 1.01    Estimated Creatinine Clearance: 64.2 ml/min (by C-G formula based on Cr of 1.01).   Medical History: Past Medical History  Diagnosis Date  . PAF (paroxysmal atrial fibrillation)     a. intolerant to amio (? lung toxicity) and tikosyn (prolonged QT);  b. 07/2012 s/p DCCV;  c. 09/2012 s/p DCCV - Multaq initiated.  . Aortic valve disorders     a. s/p AVR, 2006;  b. chronic coumadin.  . Other primary cardiomyopathies   . Edema     CHRONIC LOWER EXTREMITY  . Unspecified essential hypertension   . Other and unspecified hyperlipidemia   . Heart valve replaced by other means   . Other malaise and fatigue   . Coronary atherosclerosis of native coronary artery   . Acute myocardial infarction, subendocardial infarction, subsequent episode of care   . Unspecified adverse effect of unspecified drug, medicinal and biological substance   . Umbilical hernia without mention of obstruction or gangrene   . Sebaceous cyst   . Labyrinthitis, unspecified   . Personal history of other diseases of digestive system   . Special screening for malignant neoplasm of prostate   . BC (bronchogenic carcinoma)     S/P LOBECTOMY 2011  . Shortness of breath   . COPD (chronic obstructive pulmonary disease)   . GERD (gastroesophageal reflux  disease)     Assessment: 42 YOM admitted on 12/08/2013 with SOB upon waking. Pharmacy consulted to dose warfarin for Afib and [employer] AVR. Has been having some hemoptysis and INR= 4.17 (will plan to resume coumadin once INR down towards 2.5)  PTA dose of 2.5 mg daily EXCEPT for 5 mg on MWF  Goal of Therapy:  INR 2.5-3.5   Plan:  1. Hold Coumadin tonight  2. Daily PT/INR 3. Will continue to monitor for any signs/symptoms of bleeding and will follow up with PT/INR in the a.m.   Hildred Laser, Pharm D 12/05/2013 9:32 AM

## 2013-12-05 NOTE — Progress Notes (Signed)
NUTRITION FOLLOW UP  Pt meets criteria for SEVERE MALNUTRITION in the context of chronic illness as evidenced by severe depletion of fat and muscle.  Intervention:   -Continue Ensure Complete po BID, each supplement provides 350 kcal and 13 grams of protein  -Continue Magic cup TID with meals, each supplement provides 290 kcal and 9 grams of protein  Nutrition Dx:   Inadequate oral intake related to O2 requirements, exertion of eating, SOB as evidenced by meal completion < 25%; ongoing  Goal:   Pt to meet >/= 90% of their estimated nutrition needs  Monitor:   PO intake, supplement acceptance, weight trends, goals of care  Assessment:   Pt with chronic a fib, aortic stenosis, COPD, [facility] s/p resection, and pulmonary hypertension admitted with dyspnea. Pt on 3 L O2 at home.  Chart reviewed. Pt with very poor prognosis; he has agreed to DNR/DNI status. Noted palliative care consult for [place].  Intake continues to be poor; PO: 0-25%. Pt continues to take Ensure supplements- noted open container at bedside with straw.  Noted a 3# (1.8%) wt gain x 1 wee, likely due to fluid retention. Lasix has been stopped.  Labs reviewed. BUN: 42, Glucose: 126.   Height: Ht Readings from Last 1 Encounters:  11/19/2013 5\' 11"  (1.803 m)    Weight Status:   Wt Readings from Last 1 Encounters:  12/05/13 170 lb 6.7 oz (77.3 kg)  11/28/13  167 lb 15.9 oz (76.2 kg)   Re-estimated needs:  Kcal: 1900-2100 Protein: 93-103 grams Fluid: 1.9-2.1 L  Skin: skin tear on rt arm  Diet Order: Cardiac   Intake/Output Summary (Last 24 hours) at 12/05/13 0909 Last data filed at 12/05/13 0800  Gross per 24 hour  Intake   2745 ml  Output   1175 ml  Net   1570 ml    Last BM: 12/05/13   Labs:   Recent Labs Lab 12/03/13 0225 12/04/13 0236 12/05/13 0233  NA 141 141 138  K 4.6 4.6 4.9  CL 100 96 98  CO2 27 34* 28  BUN 33* 33* 42*  CREATININE 0.92 0.94 1.01  CALCIUM 9.3 9.6 8.6  GLUCOSE 111* 97 126*     CBG (last 3)  No results found for this basename: GLUCAP,  in the last 72 hours  Scheduled Meds: . acetylcysteine  4 mL Nebulization BID  . allopurinol  100 mg Oral Daily  . antiseptic oral rinse  7 mL Mouth Rinse BID  . arformoterol  15 mcg Nebulization BID  . aspirin EC  81 mg Oral Daily  . budesonide  0.5 mg Nebulization BID  . colchicine  0.6 mg Oral Daily  . dexamethasone  4 mg Intravenous Q12H  . diltiazem  120 mg Oral BID  . feeding supplement (ENSURE COMPLETE)  237 mL Oral BID BM  . guaiFENesin  600 mg Oral BID  . levalbuterol  0.63 mg Nebulization Q6H  . levofloxacin (LEVAQUIN) IV  750 mg Intravenous Q24H  . metoprolol  50 mg Oral BID  . sodium chloride  3 mL Intravenous Q12H  . tiotropium  18 mcg Inhalation Daily  . vancomycin  1,000 mg Intravenous Q12H  . Warfarin - Pharmacist Dosing Inpatient   Does not apply q1800    Continuous Infusions: . sodium chloride 500 mL/hr at 12/04/13 1521  . sodium chloride 75 mL/hr at 12/04/13 2026    Korinna Tat A. Jimmye Norman, RD, LDN Pager: [phone] After hours Pager: [phone]

## 2013-12-06 DIAGNOSIS — Z66 Do not resuscitate: Secondary | ICD-10-CM

## 2013-12-06 LAB — BASIC METABOLIC PANEL
ANION GAP: 10 (ref 5–15)
BUN: 40 mg/dL — ABNORMAL HIGH (ref 6–23)
CALCIUM: 9.2 mg/dL (ref 8.4–10.5)
CO2: 30 meq/L (ref 19–32)
Chloride: 97 mEq/L (ref 96–112)
Creatinine, Ser: 0.9 mg/dL (ref 0.50–1.35)
GFR calc Af Amer: >90 mL/min (ref >90)
GFR, EST NON AFRICAN AMERICAN: 79 mL/min — AB (ref >90)
Glucose, Bld: 129 mg/dL — ABNORMAL HIGH (ref 70–99)
Potassium: 4.3 mEq/L (ref 3.7–5.3)
Sodium: 137 mEq/L (ref 137–147)

## 2013-12-06 LAB — PROTIME-INR
INR: 2.36 — ABNORMAL HIGH (ref 0.00–1.49)
Prothrombin Time: 26 seconds — ABNORMAL HIGH (ref 11.6–15.2)

## 2013-12-06 LAB — CBC
HEMATOCRIT: 33.8 % — AB (ref 39.0–52.0)
Hemoglobin: 10.4 g/dL — ABNORMAL LOW (ref 13.0–17.0)
MCH: 28.7 pg (ref 26.0–34.0)
MCHC: 30.8 g/dL (ref 30.0–36.0)
MCV: 93.1 fL (ref 78.0–100.0)
PLATELETS: 245 10*3/uL (ref 150–400)
RBC: 3.63 MIL/uL — ABNORMAL LOW (ref 4.22–5.81)
RDW: 16.3 % — AB (ref 11.5–15.5)
WBC: 14.7 10*3/uL — ABNORMAL HIGH (ref 4.0–10.5)

## 2013-12-06 LAB — PRO B NATRIURETIC PEPTIDE: Pro B Natriuretic peptide (BNP): 2835 pg/mL — ABNORMAL HIGH (ref 0–450)

## 2013-12-06 LAB — CULTURE, RESPIRATORY W GRAM STAIN
Culture: NORMAL
Gram Stain: NONE SEEN

## 2013-12-06 LAB — CULTURE, RESPIRATORY

## 2013-12-06 LAB — VANCOMYCIN, TROUGH: Vancomycin Tr: 22.3 ug/mL — ABNORMAL HIGH (ref 10.0–20.0)

## 2013-12-06 LAB — MAGNESIUM: MAGNESIUM: 2.6 mg/dL — AB (ref 1.5–2.5)

## 2013-12-06 LAB — PHOSPHORUS: Phosphorus: 2.3 mg/dL (ref 2.3–4.6)

## 2013-12-06 MED ORDER — PHENOL 1.4 % MT LIQD
1.0000 | OROMUCOSAL | Status: DC | PRN
  Filled 2013-12-06: qty 177

## 2013-12-06 MED ORDER — FUROSEMIDE 10 MG/ML IJ SOLN
40.0000 mg | Freq: Three times a day (TID) | INTRAMUSCULAR | Status: DC
  Administered 2013-12-06: 40 mg via INTRAVENOUS
  Filled 2013-12-06: qty 4

## 2013-12-06 MED ORDER — DILTIAZEM HCL 30 MG PO TABS
30.0000 mg | ORAL_TABLET | Freq: Four times a day (QID) | ORAL | Status: DC
  Administered 2013-12-06 – 2013-12-07 (×3): 30 mg via ORAL
  Filled 2013-12-06 (×8): qty 1

## 2013-12-06 MED ORDER — DEXAMETHASONE SODIUM PHOSPHATE 4 MG/ML IJ SOLN
2.0000 mg | INTRAMUSCULAR | Status: DC
  Administered 2013-12-07: 2 mg via INTRAVENOUS
  Filled 2013-12-06 (×2): qty 0.5

## 2013-12-06 MED ORDER — FUROSEMIDE 10 MG/ML IJ SOLN
40.0000 mg | Freq: Two times a day (BID) | INTRAMUSCULAR | Status: DC
  Filled 2013-12-06 (×2): qty 4

## 2013-12-06 MED ORDER — VANCOMYCIN HCL IN DEXTROSE 750-5 MG/150ML-% IV SOLN
750.0000 mg | Freq: Two times a day (BID) | INTRAVENOUS | Status: DC
  Administered 2013-12-06 – 2013-12-09 (×6): 750 mg via INTRAVENOUS
  Filled 2013-12-06 (×7): qty 150

## 2013-12-06 NOTE — Progress Notes (Signed)
Physical Therapy Treatment Patient Details Name: John [name] MRN: [id number] DOB: [birth year] Today's Date: 12/06/2013    History of Present Illness Pt adm with resp failure and CHF exacerbation. Pt now with possible recurrence of lung CA and with PNA. PMH - lung CA and COPD.    PT Comments    Pt willing and able to tolerate some mobility today. Moves relatively well but is obviously very limited by his pulmonary status.   Follow Up Recommendations  Home health PT;Supervision/Assistance - 24 hour (if wife able to manage pulmonary/medical issues)     Equipment Recommendations  Wheelchair (measurements PT);Hospital bed    Recommendations for Other Services       Precautions / Restrictions Precautions Precautions: Fall Precaution Comments: High flow nasal cannula    Mobility  Bed Mobility Overal bed mobility: Needs Assistance Bed Mobility: Supine to Sit;Sit to Sidelying     Supine to sit: Min assist;+2 for safety/equipment   Sit to sidelying: +2 for physical assistance;Min assist General bed mobility comments: Assist to bring trunk up into sitting and to bring feet back up into bed when returning.  Transfers Overall transfer level: Needs assistance Equipment used: Rolling walker (2 wheeled) Transfers: Sit to/from Stand Sit to Stand: Min assist;+2 safety/equipment         General transfer comment: Assist to bring hips up.  Ambulation/Gait Ambulation/Gait assistance: Min assist;+2 safety/equipment Ambulation Distance (Feet): 2 Feet (forwards and backwards) Assistive device: Rolling walker (2 wheeled) Gait Pattern/deviations: Step-through pattern;Decreased step length - right;Decreased step length - left;Trunk flexed Gait velocity: decr Gait velocity interpretation: Below normal speed for age/gender General Gait Details: SaO2 88-90% on high flow nasal cannula. HR 110'-120's.    Stairs            Wheelchair Mobility    Modified Rankin (Stroke Patients  Only)       Balance   Sitting-balance support: No upper extremity supported;Feet supported Sitting balance-Leahy Scale: Fair Sitting balance - Comments: Sat EOB x 15 minutes. Dyspnea 3/4 on high flow nasal cannula.   Standing balance support: Bilateral upper extremity supported Standing balance-Leahy Scale: Poor Standing balance comment: Walker and min A for standing.                    Cognition Arousal/Alertness: Awake/alert Behavior During Therapy: WFL for tasks assessed/performed Overall Cognitive Status: Within Functional Limits for tasks assessed                      Exercises      General Comments        Pertinent Vitals/Pain Pain Assessment: No/denies pain    Home Living                      Prior Function            PT Goals (current goals can now be found in the care plan section) Progress towards PT goals: Not progressing toward goals - comment (continued medical issues.)    Frequency       PT Plan Current plan remains appropriate    Co-evaluation             End of Session Equipment Utilized During Treatment: Oxygen Activity Tolerance: Patient limited by fatigue Patient left: in bed;with call bell/phone within reach;with family/visitor present     Time: 1220-1243 PT Time Calculation (min): 23 min  Charges:  $Gait Training: 23-37 mins  G Codes:      [place] Sylvan 12/06/2013, 2:00 PM  [facility] PT [phone]

## 2013-12-06 NOTE — Progress Notes (Signed)
Subjective:  No CP/ Chronic SOB  Objective:  Temp:  [97.3 F (36.3 C)-98.1 F (36.7 C)] 97.5 F (36.4 C) (10/27 0800) Pulse Rate:  [98-127] 127 (10/27 1000) Resp:  [21-38] 36 (10/27 1000) BP: (90-121)/(54-86) 113/76 mmHg (10/27 1000) SpO2:  [88 %-98 %] 90 % (10/27 1000) FiO2 (%):  [66 %-100 %] 66 % (10/27 0907) Weight:  [173 lb 4.5 oz (78.6 kg)] 173 lb 4.5 oz (78.6 kg) (10/27 0300) Weight change: 2 lb 13.9 oz (1.3 kg)  Intake/Output from previous day: 10/26 0701 - 10/27 0700 In: 2120 [P.O.:820; I.V.:750; IV Piggyback:550] Out: 925 [Urine:925]  Intake/Output from this shift: Total I/O In: 240 [P.O.:240] Out: -   Physical Exam: General appearance: alert and no distress Neck: no adenopathy, no carotid bruit, no JVD, supple, symmetrical, trachea midline and thyroid not enlarged, symmetric, no tenderness/mass/nodules Lungs: clear to auscultation bilaterally Heart: irregularly irregular rhythm Extremities: extremities normal, atraumatic, no cyanosis or edema  Lab Results: Results for orders placed during the hospital encounter of 12/06/2013 (from the past 48 hour(s))  PREPARE FRESH FROZEN PLASMA     Status: None   Collection Time    12/04/13  2:00 PM      Result Value Ref Range   Unit Number [id number]     Blood Component Type THAWED PLASMA     Unit division 00     Status of Unit ISSUED,FINAL     Transfusion Status OK TO TRANSFUSE    CULTURE, EXPECTORATED SPUTUM-ASSESSMENT     Status: None   Collection Time    12/04/13  2:05 PM      Result Value Ref Range   Specimen Description SPUTUM     Special Requests NONE     Sputum evaluation       Value: THIS SPECIMEN IS ACCEPTABLE. RESPIRATORY CULTURE REPORT TO FOLLOW.   Report Status 12/04/2013 FINAL    CULTURE, RESPIRATORY (NON-EXPECTORATED)     Status: None   Collection Time    12/04/13  2:05 PM      Result Value Ref Range   Specimen Description SPUTUM     Special Requests NONE     Gram Stain       Value: NO  WBC SEEN     FEW SQUAMOUS EPITHELIAL CELLS PRESENT     NO ORGANISMS SEEN     Performed at [facility]   Culture       Value: NORMAL OROPHARYNGEAL FLORA     Performed at [facility]   Report Status 12/06/2013 FINAL    PROTIME-INR     Status: Abnormal   Collection Time    12/05/13  2:33 AM      Result Value Ref Range   Prothrombin Time 40.6 (*) 11.6 - 15.2 seconds   INR 4.17 (*) 0.00 - 3.53  BASIC METABOLIC PANEL     Status: Abnormal   Collection Time    12/05/13  2:33 AM      Result Value Ref Range   Sodium 138  137 - 147 mEq/L   Potassium 4.9  3.7 - 5.3 mEq/L   Chloride 98  96 - 112 mEq/L   CO2 28  19 - 32 mEq/L   Glucose, Bld 126 (*) 70 - 99 mg/dL   BUN 42 (*) 6 - 23 mg/dL   Creatinine, Ser 1.01  0.50 - 1.35 mg/dL   Calcium 8.6  8.4 - 10.5 mg/dL   GFR calc non Af Amer 69 (*) >  90 mL/min   GFR calc Af Amer 80 (*) >90 mL/min   Comment: (NOTE)     The eGFR has been calculated using the CKD EPI equation.     This calculation has not been validated in all clinical situations.     eGFR's persistently <90 mL/min signify possible Chronic Kidney     Disease.   Anion gap 12  5 - 15  CBC     Status: Abnormal   Collection Time    12/05/13  2:33 AM      Result Value Ref Range   WBC 20.2 (*) 4.0 - 10.5 K/uL   RBC 3.43 (*) 4.22 - 5.81 MIL/uL   Hemoglobin 10.4 (*) 13.0 - 17.0 g/dL   Comment: DELTA CHECK NOTED     REPEATED TO VERIFY   HCT 33.3 (*) 39.0 - 52.0 %   MCV 97.1  78.0 - 100.0 fL   MCH 30.3  26.0 - 34.0 pg   MCHC 31.2  30.0 - 36.0 g/dL   RDW 16.8 (*) 11.5 - 15.5 %   Platelets 226  150 - 400 K/uL  PROTIME-INR     Status: Abnormal   Collection Time    12/06/13  2:46 AM      Result Value Ref Range   Prothrombin Time 26.0 (*) 11.6 - 15.2 seconds   INR 2.36 (*) 0.00 - 2.44  BASIC METABOLIC PANEL     Status: Abnormal   Collection Time    12/06/13  2:46 AM      Result Value Ref Range   Sodium 137  137 - 147 mEq/L   Potassium 4.3  3.7 - 5.3 mEq/L    Chloride 97  96 - 112 mEq/L   CO2 30  19 - 32 mEq/L   Glucose, Bld 129 (*) 70 - 99 mg/dL   BUN 40 (*) 6 - 23 mg/dL   Creatinine, Ser 0.90  0.50 - 1.35 mg/dL   Calcium 9.2  8.4 - 10.5 mg/dL   GFR calc non Af Amer 79 (*) >90 mL/min   GFR calc Af Amer >90  >90 mL/min   Comment: (NOTE)     The eGFR has been calculated using the CKD EPI equation.     This calculation has not been validated in all clinical situations.     eGFR's persistently <90 mL/min signify possible Chronic Kidney     Disease.   Anion gap 10  5 - 15  CBC     Status: Abnormal   Collection Time    12/06/13  2:46 AM      Result Value Ref Range   WBC 14.7 (*) 4.0 - 10.5 K/uL   RBC 3.63 (*) 4.22 - 5.81 MIL/uL   Hemoglobin 10.4 (*) 13.0 - 17.0 g/dL   HCT 33.8 (*) 39.0 - 52.0 %   MCV 93.1  78.0 - 100.0 fL   MCH 28.7  26.0 - 34.0 pg   MCHC 30.8  30.0 - 36.0 g/dL   RDW 16.3 (*) 11.5 - 15.5 %   Platelets 245  150 - 400 K/uL  PRO B NATRIURETIC PEPTIDE     Status: Abnormal   Collection Time    12/06/13  2:46 AM      Result Value Ref Range   Pro B Natriuretic peptide (BNP) 2835.0 (*) 0 - 450 pg/mL  MAGNESIUM     Status: Abnormal   Collection Time    12/06/13  2:46 AM      Result  Value Ref Range   Magnesium 2.6 (*) 1.5 - 2.5 mg/dL  PHOSPHORUS     Status: None   Collection Time    12/06/13  2:46 AM      Result Value Ref Range   Phosphorus 2.3  2.3 - 4.6 mg/dL    Imaging: Imaging results have been reviewed  Tele: AFIB with RVR  Assessment/Plan:   1. Principal Problem: 2.   PNA (pneumonia) 3. Active Problems: 4.   Lung cancer 5.   Atrial fibrillation 6.   COPD exacerbation 7.   Secondary pulmonary hypertension 8.   CHF (congestive heart failure) 9.   Heart failure 10.   Acute respiratory failure with hypoxia 11.   Protein-calorie malnutrition, severe 12.   DNR (do not resuscitate) 13.   Time Spent Directly with Patient:  20 minutes  Length of Stay:  LOS: 10 days   Results of yesterday noted. Has  acute on chronic systolic/ diastolic CHF. Getting lasix q8 hrs. I/O neg 1.7 liters. Has interstitial edema on CXR. CT chest suggests lung CA with post obstructive PNA. Palliative care consulted. Pt is a DNI/DNR. He is off his coumadin A/C with a falling INR (2.4). Will start on low dose IV hep when < 2.0. He has not had any more hemoptysis. ? If this is secondary to CA. CCB started back by PCCM.    Lorretta Harp 12/06/2013, 11:52 AM

## 2013-12-06 NOTE — Progress Notes (Signed)
PULMONARY / CRITICAL CARE MEDICINE   Name: John [name] MRN: [id number] DOB: May 23, 1933    ADMISSION DATE:  11/15/2013 CONSULTATION DATE:  12/06/2013  REFERRING MD :  Murvin Natal  CHIEF COMPLAINT:  SOB  INITIAL PRESENTATION: 80 y.o. M followed by RB for COPD and [facility] s/p resection brought to [facility] ED on 10/17 with SOB.  Was placed on Levaquin roughly 1 week prior for PNA.  Became hypoxic to 84% despite his normal 3L [place].  In ED, was found to have CHF exacerbation and was subsequently admitted.  PCCM was consulted at the request of pt.  STUDIES:  CXR 10/17 >>> COPD with chronic scarring in right base.  Increased interstitial opacities in mid to lower lung zones, possibly from cardiogenic interstitial pulmonary edema. Echo 7/22 >>> EF 40-45%, diffuse hypokinesis, RV overload, mechanical AV, severely dilated LA, mod - severe TR, PAP 72.  SIGNIFICANT EVENTS: 10/17 - admit 10/20 - PCCM consult 10/24- BIPAP needed overnight, 100% NRB this am   SUBJECTIVE:  Some bright red blood with coughing overnight requiring FFP, BiPAP overnight.  Also hypotension overnight requiring fluid.  VITAL SIGNS: Temp:  [97.3 F (36.3 C)-98.4 F (36.9 C)] 97.5 F (36.4 C) (10/27 0800) Pulse Rate:  [98-125] 114 (10/27 0800) Resp:  [21-43] 23 (10/27 0800) BP: (90-121)/(54-86) 121/71 mmHg (10/27 0800) SpO2:  [88 %-98 %] 91 % (10/27 0907) FiO2 (%):  [66 %-100 %] 66 % (10/27 0907) Weight:  [78.6 kg (173 lb 4.5 oz)] 78.6 kg (173 lb 4.5 oz) (10/27 0300) HEMODYNAMICS:   VENTILATOR SETTINGS: Vent Mode:  [-]  FiO2 (%):  [66 %-100 %] 66 % INTAKE / OUTPUT: Intake/Output     10/26 0701 - 10/27 0700 10/27 0701 - 10/28 0700   P.O. 820 240   I.V. (mL/kg) 750 (9.5)    Blood     IV Piggyback 550    Total Intake(mL/kg) 2120 (27) 240 (3.1)   Urine (mL/kg/hr) 925 (0.5)    Total Output 925     Net +1195 +240          PHYSICAL EXAMINATION: General: chronically ill appearing, difficulty speaking full sentences HEENT:  NCAT, jvd down PULM; coarse CV: Irreg irreg, systolic murmur AB: BS+, soft, nontender Ext: warm trace edema Neuro: A&Ox4, maeaw  LABS:  CBC  Recent Labs Lab 12/04/13 0236 12/05/13 0233 12/06/13 0246  WBC 13.5* 20.2* 14.7*  HGB 12.6* 10.4* 10.4*  HCT 40.5 33.3* 33.8*  PLT 282 226 245   Coag's  Recent Labs Lab 12/04/13 0236 12/05/13 0233 12/06/13 0246  INR 3.34* 4.17* 2.36*   BMET  Recent Labs Lab 12/04/13 0236 12/05/13 0233 12/06/13 0246  NA 141 138 137  K 4.6 4.9 4.3  CL 96 98 97  CO2 34* 28 30  BUN 33* 42* 40*  CREATININE 0.94 1.01 0.90  GLUCOSE 97 126* 129*   Electrolytes  Recent Labs Lab 12/04/13 0236 12/05/13 0233 12/06/13 0246  CALCIUM 9.6 8.6 9.2  MG  --   --  2.6*  PHOS  --   --  2.3   Sepsis Markers No results found for this basename: LATICACIDVEN, PROCALCITON, O2SATVEN,  in the last 168 hours ABG  Recent Labs Lab 12/04/13 0330  PHART 7.371  PCO2ART 61.6*  PO2ART 79.0*   Liver Enzymes No results found for this basename: AST, ALT, ALKPHOS, BILITOT, ALBUMIN,  in the last 168 hours Cardiac Enzymes  Recent Labs Lab 12/06/13 0246  PROBNP 2835.0*   Glucose No results found  for this basename: GLUCAP,  in the last 168 hours  Imaging Ct Head Wo Contrast  12/04/2013   CLINICAL DATA:  Hypoxia with worsening neurological exam.  EXAM: CT HEAD WITHOUT CONTRAST  TECHNIQUE: Contiguous axial images were obtained from the base of the skull through the vertex without intravenous contrast.  COMPARISON:  None.  FINDINGS: Rounded hypodensity in the right caudate head suggests prior lacunar infarction. Negative for intra or extra-axial hemorrhage, mass effect, mass lesion, or evidence of acute cortically based infarction. Intracranial vascular calcifications are noted.  Mucosal thickening and air-fluid noted in the right frontal sinus. Mucosal thickening or fluid scattered in several ethmoid air cells. Maxillary sinuses and sphenoid sinuses are  clear. Mastoid air cells and middle ears are clear. Skull is intact. Soft tissues of the scalp and orbits appear symmetric.  IMPRESSION: 1. 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 2. No definite acute intracranial abnormality identified. 3. Right frontal and ethmoid air cell sinus disease.   Electronically Signed   By: Curlene Dolphin M.D.   On: 12/04/2013 12:13   Ct Chest Wo Contrast  12/04/2013   CLINICAL DATA:  Continued hypoxia.  CHF exacerbation.  Hemoptysis.  EXAM: CT CHEST WITHOUT CONTRAST  TECHNIQUE: Multidetector CT imaging of the chest was performed following the standard protocol without IV contrast.  COMPARISON:  Chest radiograph 12/04/2013 and CT chest 02/25/2013  FINDINGS: There is some respiratory motion on these images. Mediastinal lymphadenopathy is present. A precarinal lymph node measures up to 1.6 cm AP diameter. Right paratracheal lymph node measures 11 mm short axis. There additional reactive size lymph nodes. This lymphadenopathy was not present on chest CT 02/25/2013. Evaluation of the hilar structures is limited without intravenous contrast.  Cardiomegaly with biatrial enlargement appears stable. Prior median sternotomy for aortic valve replacement.  Moderate size left and small right pleural effusion. Negative for pericardial effusion.  Lung windows demonstrate advanced centrilobular emphysema. There are postsurgical changes of prior right lower lobectomy. There is fluid/secretions obstructing the right middle lobe bronchus (image number 32 of the lung windows). This is new compared to prior CT.  On prior CT 02/25/2013, the right middle lobe was hyperinflated and clear. Currently, the right middle lobe is completely opacified with predominately soft tissue density: images 30 through 40 of the lung and soft tissue windows.  There is peribronchial thickening and patchy airspace disease in the posterior right lung base, in the right upper lobe.  There is atelectasis and/or  consolidation in the left lower lobe, overlying the left pleural effusion.  No acute or suspicious bony abnormality.  Small amount of upper abdominal ascites.  Probable gallstones noted in the superior aspect of the imaged portion of the gallbladder.  IMPRESSION: 1. The dominant abnormality is complete opacification of the right middle lobe, which was previously hyperexpanded and clear on the CT of 02/25/2013. Right middle lobe is predominantly filled with soft tissue density, and there is an air-fluid level, with near obstruction of the right middle lobe bronchus. Findings could be due to consolidation of the right middle lobe due to the right middle bronchus obstructed by fluid/secretions. Pneumonia involving the right middle lobe is a possibility. The possibility of tumor involving the right middle lobe cannot be excluded, especially given its completely consolidative appearance. Close clinical and chest CT follow-up is suggested. 2. Airspace disease in the posterior right lung base (the inferior aspect of the patient's right upper lobe). Prior right lower lobectomy. 3. Mediastinal lymphadenopathy, new. 4. Bilateral pleural effusions, left  greater than right, with probable atelectasis overlying the left pleural effusion (left basilar airspace disease not excluded). 5. Advanced emphysema. 6. Cardiomegaly prior aortic valve replacement. 7. Mild abdominal ascites.   Electronically Signed   By: Curlene Dolphin M.D.   On: 12/04/2013 12:49   Dg Chest Port 1 View  12/05/2013   CLINICAL DATA:  COPD.  EXAM: PORTABLE CHEST - 1 VIEW  COMPARISON:  CT chest 12/04/2013.  Chest x-ray 12/04/2013.  FINDINGS: Prior median sternotomy and CABG. Cardiomegaly with bilateral pulmonary infiltrates and pleural effusions are present consistent with congestive heart failure. No pneumothorax. No acute osseous abnormality.  IMPRESSION: 1. Findings on today's exam consistent with congestive heart failure with bilateral pulmonary edema and  small bilateral pleural effusions. Underlying pneumonia cannot be excluded. 2. Prior CABG.   Electronically Signed   By: Marcello Moores  Register   On: 12/05/2013 07:55     ASSESSMENT / PLAN:  PULMONARY A: Severe centrilobular emphysema Acute hypoxemic respiratory failure due to atelectasis and pulm edema > now near dry weight but has persistent hypoxemia worse than baseline NSCLC s/p resection Hemoptysis from anticoagulation- resolved  P:   Chest CT with likely recurrence of cancer and LAN and post obstructive PNA Continue spiriva Continue CPAP as needed for patient comfort Chest PT High flow cannula for comfort from mask DNR  PRN albuterol. Use budesonide / brovana while hospitalized. Lasix 40 mg IV q8 x2 doses.  CARDIOVASCULAR A:  Acute on chronic systolic heart failure A.fib rate controlled Mechanical aortic valve P:  Cardiology following / managing. Currently off cardizem and metoprolol given hypotension recurring overnight again Diureses Will restart low dose cardizem 30 mg PO q6 hours.  NEUROLOGIC A:   Deconditioning Anxiety P:   Limit ambulation with worsening hypoxia Keep sats 85-88%  Family updated: Will leave orders for morphine as PRN, if deteriorates then will transition to comfort care.  Appreciate input from palliative care.  Interdisciplinary Family Meeting v Palliative Care Meeting:  Limited options.  If worsen then full comfort.  Rush Farmer, M.D. [facility] Pulmonary/Critical Care Medicine. Pager: [phone]. After hours pager: [phone].

## 2013-12-06 NOTE — Progress Notes (Signed)
ANTIBIOTIC CONSULT NOTE - follow up Pharmacy Consult for Vancomycin Indication: rule out pneumonia  Allergies  Allergen Reactions  . Benzodiazepines Other (See Comments)    Hallucinations  . Penicillins Hives, Swelling and Rash     facial swelling    Patient Measurements: Height: 5\' 11"  (180.3 cm) Weight: 173 lb 4.5 oz (78.6 kg) IBW/kg (Calculated) : 75.3  Vital Signs: Temp: 97.4 F (36.3 C) (10/27 1604) Temp Source: Oral (10/27 1604) BP: 124/74 mmHg (10/27 1600) Pulse Rate: 105 (10/27 1604) Intake/Output from previous day: 10/26 0701 - 10/27 0700 In: 2120 [P.O.:820; I.V.:750; IV Piggyback:550] Out: 925 [Urine:925] Intake/Output from this shift: Total I/O In: 630 [P.O.:480; IV Piggyback:150] Out: 475 [Urine:475]  Labs:  Recent Labs  12/04/13 0236 12/05/13 0233 12/06/13 0246  WBC 13.5* 20.2* 14.7*  HGB 12.6* 10.4* 10.4*  PLT 282 226 245  CREATININE 0.94 1.01 0.90   Estimated Creatinine Clearance: 72 ml/min (by C-G formula based on Cr of 0.9).  Recent Labs  12/06/13 1625  [place] 22.3*     Microbiology: Recent Results (from the past 720 hour(s))  MRSA PCR SCREENING     Status: None   Collection Time    12/02/2013  8:10 PM      Result Value Ref Range Status   MRSA by PCR NEGATIVE  NEGATIVE Final   Comment:            The GeneXpert MRSA Assay (FDA     approved for NASAL specimens     only), is one component of a     comprehensive MRSA colonization     surveillance program. It is not     intended to diagnose MRSA     infection nor to guide or     monitor treatment for     MRSA infections.  CULTURE, EXPECTORATED SPUTUM-ASSESSMENT     Status: None   Collection Time    12/04/13  2:05 PM      Result Value Ref Range Status   Specimen Description SPUTUM   Final   Special Requests NONE   Final   Sputum evaluation     Final   Value: THIS SPECIMEN IS ACCEPTABLE. RESPIRATORY CULTURE REPORT TO FOLLOW.   Report Status 12/04/2013 FINAL   Final  CULTURE,  RESPIRATORY (NON-EXPECTORATED)     Status: None   Collection Time    12/04/13  2:05 PM      Result Value Ref Range Status   Specimen Description SPUTUM   Final   Special Requests NONE   Final   Gram Stain     Final   Value: NO WBC SEEN     FEW SQUAMOUS EPITHELIAL CELLS PRESENT     NO ORGANISMS SEEN     Performed at [facility]   Culture     Final   Value: NORMAL OROPHARYNGEAL FLORA     Performed at [facility]   Report Status 12/06/2013 FINAL   Final     Assessment: 80 YOM admitted on 11/24/2013 with SOB upon waking. Not improving from respiratory standpoint and CXR concerning for possible RML PNA - started on vanc/levaquin 10/25. Day #3 vanc/lvq.  WBC trending down to 14.7, creat 0.9.  Afebrile. No + culture data.   10/27 VT = 22.3 on 1 gm IV q12h, VT is slightly supratherapeutic   Goal of Therapy:  Vancomycin trough level 15-20 mcg/ml  Plan:  - decrease vancomycin to 750 mg IV q12h - LVQ 750 mg IV q24h per  MD - Follow SCr, temp, WBC, C&S, VT at [facility], Pharm.D. [phone] 12/06/2013 5:55 PM

## 2013-12-06 NOTE — Progress Notes (Addendum)
Palliative Care Team at [facility]   SUBJECTIVE: John [name] and his wife report a much better night, he is tolerating the HFNC very well. He has not had any additional significant hemoptysis. His WBC count is now down to 14 from 20. No fevers, his BP is improved. He remains net negative with UOP. He was able to sit on the side of the bed X2 today which was very encouraging. Hi appetite is better. He has trouble sleeping at night mostly from medical care interruptions.   OBJECTIVE: Vital Signs: BP 118/72  Pulse 114  Temp(Src) 97.3 F (36.3 C) (Axillary)  Resp 34  Ht 5\' 11"  (1.803 m)  Wt 78.6 kg (173 lb 4.5 oz)  BMI 24.18 kg/m2  SpO2 89%   Intake and Output: 10/26 0701 - 10/27 0700 In: 2120 [P.O.:820; I.V.:750; IV Piggyback:550] Out: 55 [Urine:925]  Physical Exam: General: More awake, more comfortable and appears stronger  Head: normal  Lungs:  +rhonchi, wheezing  Heart: Regular, tachy  Abdomen:  Soft NT  Extremities: 2+ edema    Allergies  Allergen Reactions  . Benzodiazepines Other (See Comments)    Hallucinations  . Penicillins Hives, Swelling and Rash     facial swelling    Medications: Scheduled Meds:  . allopurinol  100 mg Oral Daily  . antiseptic oral rinse  7 mL Mouth Rinse BID  . arformoterol  15 mcg Nebulization BID  . aspirin EC  81 mg Oral Daily  . budesonide  0.5 mg Nebulization BID  . colchicine  0.6 mg Oral Daily  . dexamethasone  4 mg Intravenous Q12H  . diltiazem  30 mg Oral 4 times per day  . feeding supplement (ENSURE COMPLETE)  237 mL Oral BID BM  . furosemide  40 mg Intravenous Q8H  . guaiFENesin  600 mg Oral BID  . levalbuterol  0.63 mg Nebulization Q6H  . levofloxacin (LEVAQUIN) IV  750 mg Intravenous Q24H  . pantoprazole  40 mg Oral Q1200  . sodium chloride  3 mL Intravenous Q12H  . tiotropium  18 mcg Inhalation Daily  . vancomycin  1,000 mg Intravenous Q12H  . Warfarin - Pharmacist Dosing Inpatient   Does not apply  q1800    Continuous Infusions:    PRN Meds: sodium chloride, acetaminophen, albuterol, calcium carbonate, morphine injection, morphine CONCENTRATE, ondansetron (ZOFRAN) IV, sodium chloride   Labs: CBC    Component Value Date/Time   WBC 14.7* 12/06/2013 0246   RBC 3.63* 12/06/2013 0246   RBC 3.17* 10/19/2009 1600   HGB 10.4* 12/06/2013 0246   HCT 33.8* 12/06/2013 0246   PLT 245 12/06/2013 0246   MCV 93.1 12/06/2013 0246   MCH 28.7 12/06/2013 0246   MCHC 30.8 12/06/2013 0246   RDW 16.3* 12/06/2013 0246   LYMPHSABS 2.0 02/25/2013 1148   MONOABS 0.9 02/25/2013 1148   EOSABS 0.1 02/25/2013 1148   BASOSABS 0.0 02/25/2013 1148    CMET     Component Value Date/Time   NA 137 12/06/2013 0246   K 4.3 12/06/2013 0246   CL 97 12/06/2013 0246   CO2 30 12/06/2013 0246   GLUCOSE 129* 12/06/2013 0246   BUN 40* 12/06/2013 0246   CREATININE 0.90 12/06/2013 0246   CALCIUM 9.2 12/06/2013 0246   PROT 7.8 11/11/2013 1411   PROT 7.8 11/11/2013 1411   ALBUMIN 4.1 11/11/2013 1411   ALBUMIN 4.1 11/11/2013 1411   AST 33 11/11/2013 1411   AST 33 11/11/2013 1411   ALT 14  11/11/2013 1411   ALT 14 11/11/2013 1411   ALKPHOS 91 11/11/2013 1411   ALKPHOS 91 11/11/2013 1411   BILITOT 1.5* 11/11/2013 1411   BILITOT 1.5* 11/11/2013 1411   GFRNONAA 79* 12/06/2013 0246   GFRAA >90 12/06/2013 0246   ASSESSMENT/ PLAN:  Multifactorial Respiratory Failure, CT shows multilobar PNA, probable lung cancer recurrence, baseline severe COPD and CHF with Cor Pulmonale.   Recommend allowing for sleep tonight-he has not been able to rest well in hospital because of PM interventions-would like to avoid ICU/SD delirium. Discussed plan with RN for tonight.  Patient requested his Lasix be given at 8 and 2PM so that he does not have to urinate all night long, so I changed the order.  He has agreed to try a very low dose of Roxanol tonight to help him sleep.I provided education.  Continue aggressive treatment of his PNA-and  hopefully he can wean off the HF O2, I do not think we will be able to provide HF O2 at home.   He was on fairly high dose Decadron that was started several days ago for possible DAH when his hemoptysis started- I reduced this to Decadron 2mg  IV daily to avoid side effects of steroids including worsening fluid retention, agitation/confusion and infection. There may be some added benefit for inflammation given his COPD and also for appetite stimulation- will have to weigh risk benefit.  John [name] and his wife request being updated on any and all medication changes and care plan changes- he requested to review his MAR this afternoon so I went over it with him in detail and made minor adjustments to times and frequency of his medications.  I am concerned about John [name]- she has been sleeping in a chair in the hospital every night- she says that she cannot sleep at home without John [name] anyway- she looks exhausted. I encouraged her to take care of herself, but she feels that she must be at the hospital to "make sure he is cared for properly"- they have had prior bad experiences with medications being given that caused him problems and other perceived "mis-steps" so they are very cautious. She tells me at home that she takes his vital signs several times a day- he is has been gripping his portable O2 sat monitor in his hand since yesterday- John [name] told me he was "dependent on this little thing" referring to his O2 finger probe. Every aspect of his care is important to them even routine monitoring and minor fluctuations in his "numbers". John [name] tells me that they "are just like that" -perfectionists and everything must be in its place and part of a routine.  35 minutes. Greater than 50%  of this time was spent counseling and coordinating care related to the above assessment and plan.   Acquanetta Chain, DO  12/06/2013, 1:51 PM  Please contact Palliative Medicine Team phone at [phone] for  questions and concerns.

## 2013-12-07 ENCOUNTER — Ambulatory Visit: Payer: Medicare Other | Admitting: Emergency Medicine

## 2013-12-07 DIAGNOSIS — I272 Other secondary pulmonary hypertension: Secondary | ICD-10-CM

## 2013-12-07 DIAGNOSIS — J189 Pneumonia, unspecified organism: Secondary | ICD-10-CM

## 2013-12-07 LAB — CBC
HCT: 35 % — ABNORMAL LOW (ref 39.0–52.0)
HEMOGLOBIN: 11.1 g/dL — AB (ref 13.0–17.0)
MCH: 29.1 pg (ref 26.0–34.0)
MCHC: 31.7 g/dL (ref 30.0–36.0)
MCV: 91.9 fL (ref 78.0–100.0)
Platelets: 264 10*3/uL (ref 150–400)
RBC: 3.81 MIL/uL — ABNORMAL LOW (ref 4.22–5.81)
RDW: 16.4 % — AB (ref 11.5–15.5)
WBC: 11.4 10*3/uL — ABNORMAL HIGH (ref 4.0–10.5)

## 2013-12-07 LAB — BASIC METABOLIC PANEL
Anion gap: 12 (ref 5–15)
BUN: 44 mg/dL — ABNORMAL HIGH (ref 6–23)
CALCIUM: 9.5 mg/dL (ref 8.4–10.5)
CHLORIDE: 95 meq/L — AB (ref 96–112)
CO2: 28 meq/L (ref 19–32)
Creatinine, Ser: 0.98 mg/dL (ref 0.50–1.35)
GFR calc Af Amer: 89 mL/min — ABNORMAL LOW (ref >90)
GFR calc non Af Amer: 77 mL/min — ABNORMAL LOW (ref >90)
GLUCOSE: 158 mg/dL — AB (ref 70–99)
Potassium: 4.6 mEq/L (ref 3.7–5.3)
Sodium: 135 mEq/L — ABNORMAL LOW (ref 137–147)

## 2013-12-07 LAB — MAGNESIUM: Magnesium: 2.6 mg/dL — ABNORMAL HIGH (ref 1.5–2.5)

## 2013-12-07 LAB — GLUCOSE, CAPILLARY: Glucose-Capillary: 134 mg/dL — ABNORMAL HIGH (ref 70–99)

## 2013-12-07 LAB — PROTIME-INR
INR: 1.78 — ABNORMAL HIGH (ref 0.00–1.49)
Prothrombin Time: 20.9 seconds — ABNORMAL HIGH (ref 11.6–15.2)

## 2013-12-07 LAB — HEPARIN LEVEL (UNFRACTIONATED): HEPARIN UNFRACTIONATED: 0.59 [IU]/mL (ref 0.30–0.70)

## 2013-12-07 LAB — PHOSPHORUS: Phosphorus: 3 mg/dL (ref 2.3–4.6)

## 2013-12-07 MED ORDER — HEPARIN (PORCINE) IN NACL 100-0.45 UNIT/ML-% IJ SOLN
1400.0000 [IU]/h | INTRAMUSCULAR | Status: DC
  Administered 2013-12-07: 1100 [IU]/h via INTRAVENOUS
  Administered 2013-12-08: 1400 [IU]/h via INTRAVENOUS
  Filled 2013-12-07 (×4): qty 250

## 2013-12-07 MED ORDER — WARFARIN SODIUM 2.5 MG PO TABS
2.5000 mg | ORAL_TABLET | Freq: Once | ORAL | Status: DC
  Filled 2013-12-07: qty 1

## 2013-12-07 MED ORDER — FUROSEMIDE 10 MG/ML IJ SOLN
40.0000 mg | Freq: Four times a day (QID) | INTRAMUSCULAR | Status: AC
  Administered 2013-12-07 (×3): 40 mg via INTRAVENOUS
  Filled 2013-12-07: qty 4

## 2013-12-07 MED ORDER — MORPHINE SULFATE 2 MG/ML IJ SOLN
2.0000 mg | Freq: Four times a day (QID) | INTRAMUSCULAR | Status: DC
  Administered 2013-12-07 – 2013-12-09 (×7): 2 mg via INTRAVENOUS
  Filled 2013-12-07 (×6): qty 1

## 2013-12-07 MED ORDER — MORPHINE SULFATE (CONCENTRATE) 10 MG /0.5 ML PO SOLN: 5.0000 mg | ORAL | Status: DC | PRN

## 2013-12-07 MED ORDER — DILTIAZEM HCL 60 MG PO TABS
60.0000 mg | ORAL_TABLET | Freq: Four times a day (QID) | ORAL | Status: DC
  Administered 2013-12-07 – 2013-12-09 (×9): 60 mg via ORAL
  Filled 2013-12-07 (×12): qty 1

## 2013-12-07 NOTE — Progress Notes (Signed)
ANTICOAGULATION CONSULT NOTE - [place] for Heparin Indication: atrial fibrillation  Allergies  Allergen Reactions  . Benzodiazepines Other (See Comments)    Hallucinations  . Penicillins Hives, Swelling and Rash     facial swelling    Patient Measurements: Height: 5\' 11"  (180.3 cm) Weight: 171 lb 1.2 oz (77.6 kg) IBW/kg (Calculated) : 75.3  Vital Signs: Temp: 97.6 F (36.4 C) (10/28 1600) Temp Source: Axillary (10/28 1600) BP: 128/69 mmHg (10/28 1744) Pulse Rate: 35 (10/28 1600)  Labs:  Recent Labs  12/05/13 0233 12/06/13 0246 12/07/13 0325 12/07/13 1658  HGB 10.4* 10.4* 11.1*  --   HCT 33.3* 33.8* 35.0*  --   PLT 226 245 264  --   LABPROT 40.6* 26.0* 20.9*  --   INR 4.17* 2.36* 1.78*  --   HEPARINUNFRC  --   --   --  0.59  CREATININE 1.01 0.90 0.98  --     Estimated Creatinine Clearance: 66.2 ml/min (by C-G formula based on Cr of 0.98).  Assessment: 75 YOM admitted on 11/18/2013 with SOB upon waking. Pharmacy initially consulted to dose warfarin for Afib and [employer] AVR. Has been having some hemoptysis and INR trended up to 4.17, but now is 1.78 and transitioned to heparin gtt while INR <2.   Initial heparin level therapeutic at 0.59 units/mL. No hemoptysis or other bleeding noted.  Goal of Therapy:  INR 2.5-3.5   Plan:  1. Continue heparin gtt @ 1100 u/hr 2. Confirmatory heparin level with AM labs 3. Daily HL and CBC 4. Will continue to monitor CBC and for any signs/symptoms of bleeding along with long term anticoagulation plans  Wandra Babin D. Tinea Nobile, PharmD, BCPS Clinical Pharmacist Pager: [phone] 12/07/2013 6:10 PM

## 2013-12-07 NOTE — Progress Notes (Signed)
CPT not done at this time per patient resting.

## 2013-12-07 NOTE — Progress Notes (Signed)
Patient GG:YIRSWN L Scobie      DOB: [birth year]      IOE:[id number]   Palliative Medicine Team at [facility] Progress Note    Subjective: Morphine helped him sleep. Maybe a little better breathing too. No pain. Eating some.  Worried about suffering at end of life.      Filed Vitals:   12/07/13 1600  BP: 128/69  Pulse: 35  Temp: 97.6 F (36.4 C)  Resp: 37   Physical exam: GEN: alert, accessory muscle of breathing used. CV: tachy Lungs: tachypnic, coarse ABd; soft, ventral hernia Ext; no edema  CBC    Component Value Date/Time   WBC 11.4* 12/07/2013 0325   RBC 3.81* 12/07/2013 0325   RBC 3.17* 10/19/2009 1600   HGB 11.1* 12/07/2013 0325   HCT 35.0* 12/07/2013 0325   PLT 264 12/07/2013 0325   MCV 91.9 12/07/2013 0325   MCH 29.1 12/07/2013 0325   MCHC 31.7 12/07/2013 0325   RDW 16.4* 12/07/2013 0325   LYMPHSABS 2.0 02/25/2013 1148   MONOABS 0.9 02/25/2013 1148   EOSABS 0.1 02/25/2013 1148   BASOSABS 0.0 02/25/2013 1148    CMP     Component Value Date/Time   NA 135* 12/07/2013 0325   K 4.6 12/07/2013 0325   CL 95* 12/07/2013 0325   CO2 28 12/07/2013 0325   GLUCOSE 158* 12/07/2013 0325   BUN 44* 12/07/2013 0325   CREATININE 0.98 12/07/2013 0325   CALCIUM 9.5 12/07/2013 0325   PROT 7.8 11/11/2013 1411   PROT 7.8 11/11/2013 1411   ALBUMIN 4.1 11/11/2013 1411   ALBUMIN 4.1 11/11/2013 1411   AST 33 11/11/2013 1411   AST 33 11/11/2013 1411   ALT 14 11/11/2013 1411   ALT 14 11/11/2013 1411   ALKPHOS 91 11/11/2013 1411   ALKPHOS 91 11/11/2013 1411   BILITOT 1.5* 11/11/2013 1411   BILITOT 1.5* 11/11/2013 1411   GFRNONAA 77* 12/07/2013 0325   GFRAA 89* 12/07/2013 0325      Assessment and plan: 80 yo male with PMHx of COPD, NSCLC who presented with dyspnea.  CT concerning for new lung mass, post-obs PNA.   1. Code Status- DNR  2. Goals of Care- see initial consultation by Dr Hilma Favors. Appreciate conversation by Dr Nelda Marseille today. I agree that it will be difficult for him to  get home as I suspect his high O2 requirements would persist or he would have ongoing rapid respiratory decline with lower O2 flow.  Residential hospice appropriately being considered.  I suspect this is most likely where his trajectory will end up.  Trial of increased lasix today. He is most concerned about feelings of suffocation or suffering at end of life. We talked through strategies that hospices use to avoid these symptoms.    3. Dyspnea- severe and multifactorial in setting of COPD and lung CA.  Using accessory muscles to breath. Morphine has helped some. i will go ahead and scheduled 2mg  IV q6h in addition to PRN to see if we can get better control.   Total time: 30 minutes  >50% of time spent in counseling and coordination of care regarding above assessment and plan  Doran Clay D.O. Palliative Medicine Team at [facility]  Pager: [phone] Team Phone: [phone]

## 2013-12-07 NOTE — Progress Notes (Signed)
Subjective:  On NRB. No complaints  Objective:  Temp:  [97.3 F (36.3 C)-97.6 F (36.4 C)] 97.3 F (36.3 C) (10/28 0744) Pulse Rate:  [83-114] 106 (10/28 0744) Resp:  [19-41] 27 (10/28 0744) BP: (111-142)/(62-92) 142/92 mmHg (10/28 0744) SpO2:  [82 %-94 %] 90 % (10/28 0744) FiO2 (%):  [60 %] 60 % (10/28 0248) Weight:  [171 lb 1.2 oz (77.6 kg)] 171 lb 1.2 oz (77.6 kg) (10/28 0534) Weight change: -2 lb 3.3 oz (-1 kg)  Intake/Output from previous day: 10/27 0701 - 10/28 0700 In: 1290 [P.O.:840; IV Piggyback:450] Out: 1200 [Urine:1200]  Intake/Output from this shift: Total I/O In: -  Out: 150 [Urine:150]  Physical Exam: General appearance: alert and no distress Neck: no adenopathy, no carotid bruit, no JVD, supple, symmetrical, trachea midline and thyroid not enlarged, symmetric, no tenderness/mass/nodules Lungs: Right sided rhonchi Heart: irregularly irregular rhythm Extremities: extremities normal, atraumatic, no cyanosis or edema  Lab Results: Results for orders placed during the hospital encounter of 11/27/2013 (from the past 48 hour(s))  PROTIME-INR     Status: Abnormal   Collection Time    12/06/13  2:46 AM      Result Value Ref Range   Prothrombin Time 26.0 (*) 11.6 - 15.2 seconds   INR 2.36 (*) 0.00 - 4.09  BASIC METABOLIC PANEL     Status: Abnormal   Collection Time    12/06/13  2:46 AM      Result Value Ref Range   Sodium 137  137 - 147 mEq/L   Potassium 4.3  3.7 - 5.3 mEq/L   Chloride 97  96 - 112 mEq/L   CO2 30  19 - 32 mEq/L   Glucose, Bld 129 (*) 70 - 99 mg/dL   BUN 40 (*) 6 - 23 mg/dL   Creatinine, Ser 0.90  0.50 - 1.35 mg/dL   Calcium 9.2  8.4 - 10.5 mg/dL   GFR calc non Af Amer 79 (*) >90 mL/min   GFR calc Af Amer >90  >90 mL/min   Comment: (NOTE)     The eGFR has been calculated using the CKD EPI equation.     This calculation has not been validated in all clinical situations.     eGFR's persistently <90 mL/min signify possible Chronic  Kidney     Disease.   Anion gap 10  5 - 15  CBC     Status: Abnormal   Collection Time    12/06/13  2:46 AM      Result Value Ref Range   WBC 14.7 (*) 4.0 - 10.5 K/uL   RBC 3.63 (*) 4.22 - 5.81 MIL/uL   Hemoglobin 10.4 (*) 13.0 - 17.0 g/dL   HCT 33.8 (*) 39.0 - 52.0 %   MCV 93.1  78.0 - 100.0 fL   MCH 28.7  26.0 - 34.0 pg   MCHC 30.8  30.0 - 36.0 g/dL   RDW 16.3 (*) 11.5 - 15.5 %   Platelets 245  150 - 400 K/uL  PRO B NATRIURETIC PEPTIDE     Status: Abnormal   Collection Time    12/06/13  2:46 AM      Result Value Ref Range   Pro B Natriuretic peptide (BNP) 2835.0 (*) 0 - 450 pg/mL  MAGNESIUM     Status: Abnormal   Collection Time    12/06/13  2:46 AM      Result Value Ref Range   Magnesium 2.6 (*) 1.5 - 2.5  mg/dL  PHOSPHORUS     Status: None   Collection Time    12/06/13  2:46 AM      Result Value Ref Range   Phosphorus 2.3  2.3 - 4.6 mg/dL  VANCOMYCIN, TROUGH     Status: Abnormal   Collection Time    12/06/13  4:25 PM      Result Value Ref Range   Vancomycin Tr 22.3 (*) 10.0 - 20.0 ug/mL  PROTIME-INR     Status: Abnormal   Collection Time    12/07/13  3:25 AM      Result Value Ref Range   Prothrombin Time 20.9 (*) 11.6 - 15.2 seconds   INR 1.78 (*) 0.00 - 5.27  BASIC METABOLIC PANEL     Status: Abnormal   Collection Time    12/07/13  3:25 AM      Result Value Ref Range   Sodium 135 (*) 137 - 147 mEq/L   Potassium 4.6  3.7 - 5.3 mEq/L   Chloride 95 (*) 96 - 112 mEq/L   CO2 28  19 - 32 mEq/L   Glucose, Bld 158 (*) 70 - 99 mg/dL   BUN 44 (*) 6 - 23 mg/dL   Creatinine, Ser 0.98  0.50 - 1.35 mg/dL   Calcium 9.5  8.4 - 10.5 mg/dL   GFR calc non Af Amer 77 (*) >90 mL/min   GFR calc Af Amer 89 (*) >90 mL/min   Comment: (NOTE)     The eGFR has been calculated using the CKD EPI equation.     This calculation has not been validated in all clinical situations.     eGFR's persistently <90 mL/min signify possible Chronic Kidney     Disease.   Anion gap 12  5 - 15    CBC     Status: Abnormal   Collection Time    12/07/13  3:25 AM      Result Value Ref Range   WBC 11.4 (*) 4.0 - 10.5 K/uL   RBC 3.81 (*) 4.22 - 5.81 MIL/uL   Hemoglobin 11.1 (*) 13.0 - 17.0 g/dL   HCT 35.0 (*) 39.0 - 52.0 %   MCV 91.9  78.0 - 100.0 fL   MCH 29.1  26.0 - 34.0 pg   MCHC 31.7  30.0 - 36.0 g/dL   RDW 16.4 (*) 11.5 - 15.5 %   Platelets 264  150 - 400 K/uL  MAGNESIUM     Status: Abnormal   Collection Time    12/07/13  3:25 AM      Result Value Ref Range   Magnesium 2.6 (*) 1.5 - 2.5 mg/dL  PHOSPHORUS     Status: None   Collection Time    12/07/13  3:25 AM      Result Value Ref Range   Phosphorus 3.0  2.3 - 4.6 mg/dL    Imaging: Imaging results have been reviewed  Tele: AFIB with RVR   Assessment/Plan:   1. Principal Problem: 2.   PNA (pneumonia) 3. Active Problems: 4.   Lung cancer 5.   Atrial fibrillation 6.   COPD exacerbation 7.   Secondary pulmonary hypertension 8.   CHF (congestive heart failure) 9.   Heart failure 10.   Acute respiratory failure with hypoxia 11.   Protein-calorie malnutrition, severe 12.   DNR (do not resuscitate) 13.   Time Spent Directly with Patient:  20 minutes  Length of Stay:  LOS: 11 days   I have discussed patients  situation with Dr. Chuck Hint. Pt is DNR/DNI. Palliative care has seen as well. I agree with his assessment that it is unlikely that patient will be able to have EOL care at home, more likely Hospice Care here in hospital. Getting diltiazem Q6 hours as well as Q 8 hr lasix. Will continue to hold coumadin A/C. At this point Cards has nothing further to add. Will S/O and be available. Dr. Burt Knack, his primary cardiologist , is aware of his patients clinical situation.   Lorretta Harp 12/07/2013, 10:44 AM

## 2013-12-07 NOTE — Progress Notes (Signed)
Gave patient nebulizer treatment and attempted to perform CPT.  During CPT patient became short of breath and sats dropped to high 70s.  Stopped CPT and nebulizer treatment and placed back on non-rebreather mask.  Patient's sats now at 91%.  Does not appear to be working as hard.  RT will continue to monitor.

## 2013-12-07 NOTE — Progress Notes (Signed)
PULMONARY / CRITICAL CARE MEDICINE   Name: John [name] MRN: [id number] DOB: 1933/04/19    ADMISSION DATE:  11/11/2013 CONSULTATION DATE:  12/07/2013  REFERRING MD :  Murvin Natal  CHIEF COMPLAINT:  SOB  INITIAL PRESENTATION: 80 y.o. M followed by RB for COPD and [facility] s/p resection brought to [facility] ED on 10/17 with SOB.  Was placed on Levaquin roughly 1 week prior for PNA.  Became hypoxic to 84% despite his normal 3L [place].  In ED, was found to have CHF exacerbation and was subsequently admitted.  PCCM was consulted at the request of pt.  STUDIES:  CXR 10/17 >>> COPD with chronic scarring in right base.  Increased interstitial opacities in mid to lower lung zones, possibly from cardiogenic interstitial pulmonary edema. Echo 7/22 >>> EF 40-45%, diffuse hypokinesis, RV overload, mechanical AV, severely dilated LA, mod - severe TR, PAP 72.  SIGNIFICANT EVENTS: 10/17 - admit 10/20 - PCCM consult 10/24- BIPAP needed overnight, 100% NRB this am   SUBJECTIVE:  Multiple episodes of desaturation overnight, breathing mostly through the mouth.  VITAL SIGNS: Temp:  [97.3 F (36.3 C)-97.6 F (36.4 C)] 97.3 F (36.3 C) (10/28 0744) Pulse Rate:  [83-127] 106 (10/28 0744) Resp:  [19-41] 27 (10/28 0744) BP: (111-142)/(62-92) 142/92 mmHg (10/28 0744) SpO2:  [82 %-94 %] 90 % (10/28 0744) FiO2 (%):  [60 %-66 %] 60 % (10/28 0248) Weight:  [77.6 kg (171 lb 1.2 oz)] 77.6 kg (171 lb 1.2 oz) (10/28 0534) HEMODYNAMICS:   VENTILATOR SETTINGS: Vent Mode:  [-]  FiO2 (%):  [60 %-66 %] 60 % INTAKE / OUTPUT: Intake/Output     10/27 0701 - 10/28 0700 10/28 0701 - 10/29 0700   P.O. 840    I.V. (mL/kg)     IV Piggyback 450    Total Intake(mL/kg) 1290 (16.6)    Urine (mL/kg/hr) 1200 (0.6)    Total Output 1200     Net +90            PHYSICAL EXAMINATION: General: chronically ill appearing, difficulty speaking full sentences, moderate respiratory distress this AM HEENT: NCAT, jvd down PULM; coarse CV:  Irreg irreg, systolic murmur AB: BS+, soft, nontender Ext: warm trace edema Neuro: A&Ox4, maeaw  LABS:  CBC  Recent Labs Lab 12/05/13 0233 12/06/13 0246 12/07/13 0325  WBC 20.2* 14.7* 11.4*  HGB 10.4* 10.4* 11.1*  HCT 33.3* 33.8* 35.0*  PLT 226 245 264   Coag's  Recent Labs Lab 12/05/13 0233 12/06/13 0246 12/07/13 0325  INR 4.17* 2.36* 1.78*   BMET  Recent Labs Lab 12/05/13 0233 12/06/13 0246 12/07/13 0325  NA 138 137 135*  K 4.9 4.3 4.6  CL 98 97 95*  CO2 28 30 28   BUN 42* 40* 44*  CREATININE 1.01 0.90 0.98  GLUCOSE 126* 129* 158*   Electrolytes  Recent Labs Lab 12/05/13 0233 12/06/13 0246 12/07/13 0325  CALCIUM 8.6 9.2 9.5  MG  --  2.6* 2.6*  PHOS  --  2.3 3.0   Sepsis Markers No results found for this basename: LATICACIDVEN, PROCALCITON, O2SATVEN,  in the last 168 hours ABG  Recent Labs Lab 12/04/13 0330  PHART 7.371  PCO2ART 61.6*  PO2ART 79.0*   Liver Enzymes No results found for this basename: AST, ALT, ALKPHOS, BILITOT, ALBUMIN,  in the last 168 hours Cardiac Enzymes  Recent Labs Lab 12/06/13 0246  PROBNP 2835.0*   Glucose No results found for this basename: GLUCAP,  in the last 168 hours  Imaging  No results found.   ASSESSMENT / PLAN:  PULMONARY A: Severe centrilobular emphysema Acute hypoxemic respiratory failure due to atelectasis and pulm edema > now near dry weight but has persistent hypoxemia worse than baseline NSCLC s/p resection Hemoptysis from anticoagulation- resolved  P:   Chest CT with likely recurrence of cancer and LAN and post obstructive PNA Continue spiriva On high flow  but breathing through the mouth and causing frequent desaturation episodes. Chest PT High flow cannula for comfort from mask DNR  PRN albuterol. Use budesonide / brovana while hospitalized. Lasix 40 mg IV q6 x3 doses.  CARDIOVASCULAR A:  Acute on chronic systolic heart failure A.fib rate controlled Mechanical aortic  valve P:  Cardiology following / managing. Increase Cardizem at 60 mg PO q6. Diureses  NEUROLOGIC A:   Deconditioning Anxiety P:   Limit ambulation with worsening hypoxia Keep sats 85-88%  Family updated: Spent a large amount of time with patient and wife, the patient's expectations are unrealistic, he believes he can go home and be able to ambulate some, shower, go to the bathroom and perform basic ADL while he desaturates with just eating in the hospital.  He is very anxious and I doubted he will be able to handle the reality here that best case scenario he will be going home with hospice if at all given high need for O2 while he only has a concentrator that can go up to 6 liters at home.  I informed him of the challenge ahead and that he is more likely than not will need to be in a hospice facility unless we can get his O2 down to a manageable level.  I took wife outside the room subsequently and she seems much more realistic, she believes that he is unlikely to go home but would love for him to go home.  I informed her I will diurese him more today but realistically if that is ineffective then best case scenario will be hospice facility that can provide high flow O2.  I also requested that she communicates with case management to coordinate home O2 needs.  In the meantime, the patient is clearly breathing through his mouth so will reattempt face mask in an effort to decrease flow rates required.  However, patient is clearly deteriorating and will need help from the palliative care service for coordination of efforts for hospice needs.  Interdisciplinary Family Meeting v Palliative Care Meeting:  Limited options.  If worsen then full comfort.  CC time 35 min.  Rush Farmer, M.D. [facility] Pulmonary/Critical Care Medicine. Pager: [phone]. After hours pager: [phone].

## 2013-12-07 NOTE — Progress Notes (Addendum)
ANTICOAGULATION CONSULT NOTE - [place] for Heparin Indication: atrial fibrillation  Allergies  Allergen Reactions  . Benzodiazepines Other (See Comments)    Hallucinations  . Penicillins Hives, Swelling and Rash     facial swelling    Patient Measurements: Height: 5\' 11"  (180.3 cm) Weight: 171 lb 1.2 oz (77.6 kg) IBW/kg (Calculated) : 75.3  Vital Signs: Temp: 97.6 F (36.4 C) (10/28 0500) Temp Source: Axillary (10/28 0500) BP: 122/85 mmHg (10/28 0400) Pulse Rate: 100 (10/28 0400)  Labs:  Recent Labs  12/05/13 0233 12/06/13 0246 12/07/13 0325  HGB 10.4* 10.4* 11.1*  HCT 33.3* 33.8* 35.0*  PLT 226 245 264  LABPROT 40.6* 26.0* 20.9*  INR 4.17* 2.36* 1.78*  CREATININE 1.01 0.90 0.98    Estimated Creatinine Clearance: 66.2 ml/min (by C-G formula based on Cr of 0.98).   Medical History: Past Medical History  Diagnosis Date  . PAF (paroxysmal atrial fibrillation)     a. intolerant to amio (? lung toxicity) and tikosyn (prolonged QT);  b. 07/2012 s/p DCCV;  c. 09/2012 s/p DCCV - Multaq initiated.  . Aortic valve disorders     a. s/p AVR, 2006;  b. chronic coumadin.  . Other primary cardiomyopathies   . Edema     CHRONIC LOWER EXTREMITY  . Unspecified essential hypertension   . Other and unspecified hyperlipidemia   . Heart valve replaced by other means   . Other malaise and fatigue   . Coronary atherosclerosis of native coronary artery   . Acute myocardial infarction, subendocardial infarction, subsequent episode of care   . Unspecified adverse effect of unspecified drug, medicinal and biological substance   . Umbilical hernia without mention of obstruction or gangrene   . Sebaceous cyst   . Labyrinthitis, unspecified   . Personal history of other diseases of digestive system   . Special screening for malignant neoplasm of prostate   . BC (bronchogenic carcinoma)     S/P LOBECTOMY 2011  . Shortness of breath   . COPD (chronic  obstructive pulmonary disease)   . GERD (gastroesophageal reflux disease)     Assessment: 41 YOM admitted on 11/16/2013 with SOB upon waking. Pharmacy initially consulted to dose warfarin for Afib and St. Jude AVR. Has been having some hemoptysis and INR trended up to 4.17. We will now transition to heparin gtt while INR <2. H/H improved but still low, plt wnl and stable with no further reported hemoptysis.   PTA dose of 2.5 mg daily EXCEPT for 5 mg on MWF  Goal of Therapy:  INR 2.5-3.5   Plan:  - Start heparin gtt @ 1100 u/hr - 8 hour HL then daily  - Will continue to monitor CBC and for any signs/symptoms of bleeding   Harolyn Rutherford, PharmD Clinical Pharmacist - Resident Pager: [phone] Pharmacy: [phone] 12/07/2013 7:29 AM

## 2013-12-07 NOTE — Progress Notes (Signed)
CPT held at this time due to patient coughing and SOB upon entering room. Nebulizer treatment started and pt settled, sats now 90%. Will continue to monitor.

## 2013-12-08 ENCOUNTER — Inpatient Hospital Stay (HOSPITAL_COMMUNITY): Payer: Medicare Other

## 2013-12-08 LAB — BASIC METABOLIC PANEL
ANION GAP: 15 (ref 5–15)
BUN: 59 mg/dL — ABNORMAL HIGH (ref 6–23)
CALCIUM: 9.4 mg/dL (ref 8.4–10.5)
CHLORIDE: 95 meq/L — AB (ref 96–112)
CO2: 26 meq/L (ref 19–32)
Creatinine, Ser: 1.39 mg/dL — ABNORMAL HIGH (ref 0.50–1.35)
GFR calc non Af Amer: 47 mL/min — ABNORMAL LOW (ref >90)
GFR, EST AFRICAN AMERICAN: 54 mL/min — AB (ref >90)
Glucose, Bld: 131 mg/dL — ABNORMAL HIGH (ref 70–99)
Potassium: 4.8 mEq/L (ref 3.7–5.3)
Sodium: 136 mEq/L — ABNORMAL LOW (ref 137–147)

## 2013-12-08 LAB — HEPARIN LEVEL (UNFRACTIONATED)
HEPARIN UNFRACTIONATED: 0.36 [IU]/mL (ref 0.30–0.70)
Heparin Unfractionated: 0.5 IU/mL (ref 0.30–0.70)

## 2013-12-08 LAB — CBC
HEMATOCRIT: 36.2 % — AB (ref 39.0–52.0)
HEMOGLOBIN: 11.4 g/dL — AB (ref 13.0–17.0)
MCH: 28.9 pg (ref 26.0–34.0)
MCHC: 31.5 g/dL (ref 30.0–36.0)
MCV: 91.9 fL (ref 78.0–100.0)
Platelets: 324 10*3/uL (ref 150–400)
RBC: 3.94 MIL/uL — ABNORMAL LOW (ref 4.22–5.81)
RDW: 16.7 % — AB (ref 11.5–15.5)
WBC: 16.2 10*3/uL — ABNORMAL HIGH (ref 4.0–10.5)

## 2013-12-08 LAB — PHOSPHORUS: PHOSPHORUS: 5.5 mg/dL — AB (ref 2.3–4.6)

## 2013-12-08 LAB — PROTIME-INR
INR: 2.03 — AB (ref 0.00–1.49)
PROTHROMBIN TIME: 23.1 s — AB (ref 11.6–15.2)

## 2013-12-08 LAB — MAGNESIUM: Magnesium: 2.8 mg/dL — ABNORMAL HIGH (ref 1.5–2.5)

## 2013-12-08 MED ORDER — HEPARIN (PORCINE) IN NACL 100-0.45 UNIT/ML-% IJ SOLN
1400.0000 [IU]/h | INTRAMUSCULAR | Status: DC
  Administered 2013-12-08 – 2013-12-09 (×2): 1400 [IU]/h via INTRAVENOUS
  Filled 2013-12-08 (×3): qty 250

## 2013-12-08 MED ORDER — PREDNISONE 20 MG PO TABS
20.0000 mg | ORAL_TABLET | Freq: Every day | ORAL | Status: DC
  Administered 2013-12-09: 20 mg via ORAL
  Filled 2013-12-08 (×2): qty 1

## 2013-12-08 MED ORDER — FUROSEMIDE 10 MG/ML IJ SOLN
20.0000 mg | Freq: Once | INTRAMUSCULAR | Status: AC
  Administered 2013-12-08: 20 mg via INTRAVENOUS
  Filled 2013-12-08: qty 2

## 2013-12-08 NOTE — Progress Notes (Addendum)
ANTICOAGULATION CONSULT NOTE - Follow Up Consult  Pharmacy Consult for heparin Indication: atrial fibrillation  Labs:  Recent Labs  12/06/13 0246 12/07/13 0325 12/07/13 1658 12/08/13 0320  HGB 10.4* 11.1*  --  11.4*  HCT 33.8* 35.0*  --  36.2*  PLT 245 264  --  324  LABPROT 26.0* 20.9*  --  23.1*  INR 2.36* 1.78*  --  2.03*  HEPARINUNFRC  --   --  0.59 <0.10*  CREATININE 0.90 0.98  --  1.39*    Assessment: 80yo male on heparin for St Jude AVR and afib. Coumadin has been on hold for episodes of hemoptysis and heparin was started 10/28. Palliative care seeing and plans for hospice in process.  Discussed anticoagulation plans with Dr. Gwenlyn Found. Continue heparin and monitor for any further hemoptysis (none since 10/26).  Continue coumadin only if the Hospice will monitor PT/INRs on coumadin.  Possible placement to [facility] and I spoke with them today and per my discussion with them,  they do not monitor PT/INRs for coumadin patients.    Goal of Therapy:  Heparin level 0.3-0.7 units/ml   Plan:  -Continue heparin   -Monitor for for hemoptysis and if this occurs will need to d/c heparin -Per MD resume coumadin only if the hospice facility will monitor PT/INRs ([facility] does not offer this monitoring)  Hildred Laser, Pharm D 12/08/2013 10:32 AM  Addendum: heparin -heparin level= 0.36   Plan -No heparin changes needed -Will recheck a heparin level later today to confirm  Hildred Laser, Pharm D 12/08/2013 3:08 PM

## 2013-12-08 NOTE — Progress Notes (Signed)
Patient refusing CPT at this time.  Will attempt to do later.

## 2013-12-08 NOTE — Progress Notes (Signed)
PT Cancellation Note  Patient Details Name: John [name] MRN: [id number] DOB: 04/16/1933   Cancelled Treatment:    Reason Eval/Treat Not Completed: Medical issues which prohibited therapy   Travers Goodley 12/08/2013, 2:08 PM

## 2013-12-08 NOTE — Clinical Social Work Psychosocial (Signed)
     Clinical Social Work Department BRIEF PSYCHOSOCIAL ASSESSMENT 12/08/2013  Patient:  John [name], John [name]     Account Number:  [id number]     Admit date:  11/11/2013  Clinical Social Worker:  Adair Laundry  Date/Time:  12/08/2013 11:00 AM  Referred by:  Physician  Date Referred:  12/08/2013 Referred for  SNF Placement   Other Referral:   Interview type:  Patient Other interview type:   Spoke with pt and pt wife at bedside    PSYCHOSOCIAL DATA Living Status:  WIFE Admitted from facility:   Level of care:   Primary support name:  John [name] Primary support relationship to patient:  SPOUSE Degree of support available:   Pt has strong support    CURRENT CONCERNS Current Concerns  Post-Acute Placement   Other Concerns:    SOCIAL WORK ASSESSMENT / PLAN CSW made aware by [facility] that pt will likely need residential hospice placement. CSW spoke with pt and pt wife at bedside. Pt minimally participated in conversation. Pt wife informed CSW that MD had thoroughly explained residential hospice and they are agreeable to this. They would prefer [facility]. CSW explained referral process. Pt wife very understanding. Plan discussed will be to make referral to [facility] today. If there is any indication that [facility] may not be an option, CSW will visit pt room at end of day today and offer residential hospice facility list. Pt wife expressed that she would greatly prefer for pt to be able to remain here in [place], however, she was understanding of need to make back-up referrals. Pt wife informed CSW she will be available at pt bedside for majority of the day. However, she does need to go home for a little while. Pt wife debating remaining in the hospital. CSW encouraged pt wife to take care of herself as well as any errands she has. CSW or residential hospice facility can reach her by phone if needed. Pt wife thankful for CSW support.   Assessment/plan status:   Psychosocial Support/Ongoing Assessment of Needs Other assessment/ plan:   Information/referral to community resources:   Residential Hospice list to be provided if needed    PATIENTS/FAMILYS RESPONSE TO PLAN OF CARE: Pt and pt wife are in agreement for residential hospice.      [place], [place]

## 2013-12-08 NOTE — Progress Notes (Signed)
ANTICOAGULATION CONSULT NOTE - Follow Up Consult  Pharmacy Consult for heparin Indication: atrial fibrillation  Labs:  Recent Labs  12/06/13 0246 12/07/13 0325  12/08/13 0320 12/08/13 1300 12/08/13 2027  HGB 10.4* 11.1*  --  11.4*  --   --   HCT 33.8* 35.0*  --  36.2*  --   --   PLT 245 264  --  324  --   --   LABPROT 26.0* 20.9*  --  23.1*  --   --   INR 2.36* 1.78*  --  2.03*  --   --   HEPARINUNFRC  --   --   < > <0.10* 0.36 0.50  CREATININE 0.90 0.98  --  1.39*  --   --   < > = values in this interval not displayed.  Assessment: 80yo male on heparin for St Jude AVR and afib. Coumadin has been on hold for episodes of hemoptysis and heparin was started 10/28. Palliative care seeing and plans for hospice in process. See Mitzi Hansen Meyer's note from 10/29 regarding outpatient anticoagulation plans.  Heparin level this evening therapeutic at 0.5 units/mL. No hemoptysis noted.  Goal of Therapy:  Heparin level 0.3-0.7 units/ml   Plan:  -Continue heparin at 1400 units/hr -daily HL and CBC -Monitor for for hemoptysis and if this occurs will need to d/c heparin   Ziyan Hillmer D. Lakenya Riendeau, PharmD, BCPS Clinical Pharmacist Pager: [phone] 12/08/2013 9:18 PM

## 2013-12-08 NOTE — Progress Notes (Signed)
Pt lung sounds have increased crackles; Pt sounds wet when coughing and suctioning more frequently; Pt work of breathing has increased and O2 stats in the low 80's on high air flow nasal cannula. [facility] notified and order 20 mg of lasix. Will continue to monitor pt.

## 2013-12-08 NOTE — Progress Notes (Signed)
Patient DQ:[id number] L Pry      DOB: October 31, 1933      GXQ:[id number]   Palliative Medicine Team at [facility] Progress Note    Subjective: Breathing feels a bit better today.  No pain.  Eating a bit.  Family interested in [place] today.      Filed Vitals:   12/08/13 0800  BP: 119/77  Pulse: 96  Temp:   Resp: 32   Physical exam: Gen: alert, NAD HEENT: [place], sclera anicteric CV: Regular rate Lungs: decreased breath sounds on right with coarse sounds R>L ABD: soft, ventral hernia EXT: warm   CBC    Component Value Date/Time   WBC 16.2* 12/08/2013 0320   RBC 3.94* 12/08/2013 0320   RBC 3.17* 10/19/2009 1600   HGB 11.4* 12/08/2013 0320   HCT 36.2* 12/08/2013 0320   PLT 324 12/08/2013 0320   MCV 91.9 12/08/2013 0320   MCH 28.9 12/08/2013 0320   MCHC 31.5 12/08/2013 0320   RDW 16.7* 12/08/2013 0320   LYMPHSABS 2.0 02/25/2013 1148   MONOABS 0.9 02/25/2013 1148   EOSABS 0.1 02/25/2013 1148   BASOSABS 0.0 02/25/2013 1148    CMP     Component Value Date/Time   NA 136* 12/08/2013 0320   K 4.8 12/08/2013 0320   CL 95* 12/08/2013 0320   CO2 26 12/08/2013 0320   GLUCOSE 131* 12/08/2013 0320   BUN 59* 12/08/2013 0320   CREATININE 1.39* 12/08/2013 0320   CALCIUM 9.4 12/08/2013 0320   PROT 7.8 11/11/2013 1411   PROT 7.8 11/11/2013 1411   ALBUMIN 4.1 11/11/2013 1411   ALBUMIN 4.1 11/11/2013 1411   AST 33 11/11/2013 1411   AST 33 11/11/2013 1411   ALT 14 11/11/2013 1411   ALT 14 11/11/2013 1411   ALKPHOS 91 11/11/2013 1411   ALKPHOS 91 11/11/2013 1411   BILITOT 1.5* 11/11/2013 1411   BILITOT 1.5* 11/11/2013 1411   GFRNONAA 47* 12/08/2013 0320   GFRAA 54* 12/08/2013 0320       Assessment and plan: 80 yo male with PMHx of COPD, NSCLC who presented with dyspnea. CT concerning for new lung mass, post-obs PNA.   1. Code Status- DNR   2. Goals of Care- breathing feeling better after morphine. Understands challenges with improving his O2 and what this means for his  disposition. Complicated by emotional distress over his prognosis.  They are willing to explore [facility] and awaiting to hear from social work about what O2 they could provide.  Doubt optiflow would be option.  Discussed that [facility] would also not be able to do heparin gtt for mechanical heart valve or antibiotics. They wonder if they could use home coumadin at [facility]. I would certainly not be opposed as I do not think it will alter his prognosis one way or the other, but would provide patient/family some decreased distress.  Possibility that he may pass away as inpatient due to O2 demands.    3. Dyspnea- continued scheduled and PRN morphine. Not sure how much O2 will be able to wean from here.    Total Time: 30 minutes  >50% of time spent in counseling and coordination of care regarding above assessment and plan.   Doran Clay D.O. Palliative Medicine Team at [facility]  Pager: [phone] Team Phone: [phone]

## 2013-12-08 NOTE — Progress Notes (Signed)
PULMONARY / CRITICAL CARE MEDICINE   Name: JOSEPHINE RUDNICK MRN: [id number] DOB: 1934-02-08    ADMISSION DATE:  11/25/2013 CONSULTATION DATE:  12/08/2013  REFERRING MD :  Murvin Natal  CHIEF COMPLAINT:  SOB  INITIAL PRESENTATION: 80 y.o. M followed by RB for COPD and NCSLC s/p resection brought to [facility] ED on 10/17 with SOB.  Was placed on Levaquin roughly 1 week prior for PNA.  Became hypoxic to 84% despite his normal 3L [place].  In ED, was found to have CHF exacerbation and was subsequently admitted.  PCCM was consulted at the request of pt.  STUDIES:  CXR 10/17 >>> COPD with chronic scarring in right base.  Increased interstitial opacities in mid to lower lung zones, possibly from cardiogenic interstitial pulmonary edema. Echo 7/22 >>> EF 40-45%, diffuse hypokinesis, RV overload, mechanical AV, severely dilated LA, mod - severe TR, PAP 72.  SIGNIFICANT EVENTS: 10/17 - admit 10/20 - PCCM consult 10/24- BIPAP needed overnight, 100% NRB this am   SUBJECTIVE:  Multiple episodes of desaturation overnight, breathing mostly through the mouth.  VITAL SIGNS: Temp:  [97.4 F (36.3 C)-97.9 F (36.6 C)] 97.9 F (36.6 C) (10/29 0714) Pulse Rate:  [35-117] 96 (10/29 0800) Resp:  [26-39] 32 (10/29 0800) BP: (119-148)/(69-95) 119/77 mmHg (10/29 0800) SpO2:  [83 %-94 %] 91 % (10/29 0720) FiO2 (%):  [95 %-100 %] 95 % (10/29 0720) Weight:  [77.6 kg (171 lb 1.2 oz)] 77.6 kg (171 lb 1.2 oz) (10/29 0500) HEMODYNAMICS:   VENTILATOR SETTINGS: Vent Mode:  [-]  FiO2 (%):  [95 %-100 %] 95 % INTAKE / OUTPUT: Intake/Output     10/28 0701 - 10/29 0700 10/29 0701 - 10/30 0700   P.O. 200    I.V. (mL/kg) 234.7 (3) 14 (0.2)   Other 20    IV Piggyback 450    Total Intake(mL/kg) 904.7 (11.7) 14 (0.2)   Urine (mL/kg/hr) 800 (0.4) 100 (0.3)   Total Output 800 100   Net +104.7 -86          PHYSICAL EXAMINATION: General: chronically ill appearing, difficulty speaking full sentences, moderate respiratory  distress HEENT: NCAT, jvd down PULM; coarse CV: Irreg irreg, systolic murmur AB: BS+, soft, nontender Ext: warm trace edema Neuro: A&Ox4, m[place]  LABS:  CBC  Recent Labs Lab 12/06/13 0246 12/07/13 0325 12/08/13 0320  WBC 14.7* 11.4* 16.2*  HGB 10.4* 11.1* 11.4*  HCT 33.8* 35.0* 36.2*  PLT 245 264 324   Coag's  Recent Labs Lab 12/06/13 0246 12/07/13 0325 12/08/13 0320  INR 2.36* 1.78* 2.03*   BMET  Recent Labs Lab 12/06/13 0246 12/07/13 0325 12/08/13 0320  NA 137 135* 136*  K 4.3 4.6 4.8  CL 97 95* 95*  CO2 30 28 26   BUN 40* 44* 59*  CREATININE 0.90 0.98 1.39*  GLUCOSE 129* 158* 131*   Electrolytes  Recent Labs Lab 12/06/13 0246 12/07/13 0325 12/08/13 0320  CALCIUM 9.2 9.5 9.4  MG 2.6* 2.6* 2.8*  PHOS 2.3 3.0 5.5*   Sepsis Markers No results found for this basename: LATICACIDVEN, PROCALCITON, O2SATVEN,  in the last 168 hours ABG  Recent Labs Lab 12/04/13 0330  PHART 7.371  PCO2ART 61.6*  PO2ART 79.0*   Liver Enzymes No results found for this basename: AST, ALT, ALKPHOS, BILITOT, ALBUMIN,  in the last 168 hours Cardiac Enzymes  Recent Labs Lab 12/06/13 0246  PROBNP 2835.0*   Glucose  Recent Labs Lab 12/07/13 1655  GLUCAP 134*    Imaging Dg  Chest Port 1 View  12/08/2013   CLINICAL DATA:  80 year old male with shortness of breath. Evaluate for pulmonary edema and/or pneumonia.  EXAM: PORTABLE CHEST - 1 VIEW  COMPARISON:  Chest x-ray 12/05/2013.  FINDINGS: Lung volumes are low. Extensive bibasilar opacities may reflect areas of atelectasis and/or consolidation. Small left and small to moderate right pleural effusions appear unchanged. Some cephalization of the pulmonary vasculature is noted. Mild cardiomegaly. Upper mediastinal contours are within normal limits. Atherosclerosis in the thoracic aorta.  IMPRESSION: 1. Although there may be a background of some mild interstitial pulmonary edema, given the extensive bibasilar  consolidation, findings are concerning for multilobar pneumonia or sequela of recent aspiration. 2. Small to moderate right and small left pleural effusions appear unchanged. 3. There is also likely significant bibasilar subsegmental atelectasis. 4. Mild cardiomegaly. 5. Atherosclerosis.   Electronically Signed   By: Vinnie Langton M.D.   On: 12/08/2013 07:51     ASSESSMENT / PLAN:  PULMONARY A: Severe centrilobular emphysema Acute hypoxemic respiratory failure due to atelectasis and pulm edema > now near dry weight but has persistent hypoxemia worse than baseline NSCLC s/p resection Hemoptysis from anticoagulation- resolved  P:   Chest CT with likely recurrence of cancer and LAN and post obstructive PNA Continue spiriva Case manager finding what [facility] can provide O2 flow wise, pending that will decide whether to keep patient on high flow O2 [place] or 100% mask. Chest PT High flow cannula for comfort from mask DNR  PRN albuterol. Use budesonide / brovana while hospitalized. Hold further diureses. Change decadron to prednisone PO  CARDIOVASCULAR A:  Acute on chronic systolic heart failure A.fib rate controlled Mechanical aortic valve P:  Increased Cardizem at 60 mg PO q6, will maintain No anticoag given clinical picture and fall risk Hold further diureses  NEUROLOGIC A:   Deconditioning Anxiety P:   Limit ambulation with worsening hypoxia Keep sats 85-88%  Family updated: Discharge planning as above, patient and wife notified, if [facility] can provide high flow [place] then will maintain on that, if not then will change to 100% NRB.  Patient remains severely hypoxic but after discussion with him and wife they are at the point where they recognize that patient will not improve and are ok with d/c to [facility] with hospice once bed is available.  Interdisciplinary Family Meeting v Palliative Care Meeting:  Limited options.  If worsen then full comfort.  CC time 35  min.  Rush Farmer, M.D. [facility] Pulmonary/Critical Care Medicine. Pager: [phone]. After hours pager: [phone].

## 2013-12-08 NOTE — Progress Notes (Signed)
Pt placed on high flow nasal cannula around 0300 to have a break from the NRB, as his lips are getting very dry. sats remain between 88-91%. Will continue to monitor.

## 2013-12-08 NOTE — Progress Notes (Signed)
Pt refused to change positions last night and extremely focused on personal pulse ox finger probe. Nurse tried to provide comfort to pt and asked if he wanted to put pulse ox aside, but pt refused. He requested bath at 6am, but once nurse started bathing pt refused stating "Just leave me alone, I want to rest". Pt is also denying PRN medications. Will continue to monitor and porvide reassurance to pt.

## 2013-12-08 NOTE — Progress Notes (Signed)
ANTICOAGULATION CONSULT NOTE - Follow Up Consult  Pharmacy Consult for heparin Indication: atrial fibrillation  Labs:  Recent Labs  12/06/13 0246 12/07/13 0325 12/07/13 1658 12/08/13 0320  HGB 10.4* 11.1*  --  11.4*  HCT 33.8* 35.0*  --  36.2*  PLT 245 264  --  324  LABPROT 26.0* 20.9*  --  23.1*  INR 2.36* 1.78*  --  2.03*  HEPARINUNFRC  --   --  0.59 <0.10*  CREATININE 0.90 0.98  --  1.39*    Assessment: 80yo male now undetectable on heparin after one level at goal; of note pt has a bandage on the arm that heparin is running and may have had previous heparin level drawn incorrectly.  Goal of Therapy:  Heparin level 0.3-0.7 units/ml   Plan:  Will increase heparin gtt by 4 units/kg/hr to 1400 units/hr and check level in [place], PharmD, BCPS  12/08/2013,4:43 AM

## 2013-12-08 NOTE — Progress Notes (Signed)
CSW (Clinical Education officer, museum) notified by [employer] that they can accept pt tomorrow. CSW notified RNCM, pt nurse, and MD.  Berton Mount, [place]

## 2013-12-08 NOTE — Progress Notes (Signed)
   Bedside RN feels more crackly and slightly more hypoxemic   Plan Lasix 20mg  IVX 1  Dr. Brand Males, M.D., [facility].C.P Pulmonary and Critical Care Medicine Staff Physician [place] Pulmonary and Critical Care Pager: [phone], If no answer or between  15:00h - 7:00h: call [phone]  12/08/2013 3:54 AM

## 2013-12-09 LAB — BASIC METABOLIC PANEL
ANION GAP: 14 (ref 5–15)
BUN: 65 mg/dL — ABNORMAL HIGH (ref 6–23)
CHLORIDE: 96 meq/L (ref 96–112)
CO2: 25 mEq/L (ref 19–32)
Calcium: 9.2 mg/dL (ref 8.4–10.5)
Creatinine, Ser: 1.35 mg/dL (ref 0.50–1.35)
GFR, EST AFRICAN AMERICAN: 56 mL/min — AB (ref >90)
GFR, EST NON AFRICAN AMERICAN: 49 mL/min — AB (ref >90)
Glucose, Bld: 122 mg/dL — ABNORMAL HIGH (ref 70–99)
POTASSIUM: 4.6 meq/L (ref 3.7–5.3)
SODIUM: 135 meq/L — AB (ref 137–147)

## 2013-12-09 LAB — CBC
HCT: 33.9 % — ABNORMAL LOW (ref 39.0–52.0)
HEMOGLOBIN: 10.9 g/dL — AB (ref 13.0–17.0)
MCH: 29.5 pg (ref 26.0–34.0)
MCHC: 32.2 g/dL (ref 30.0–36.0)
MCV: 91.6 fL (ref 78.0–100.0)
PLATELETS: 287 10*3/uL (ref 150–400)
RBC: 3.7 MIL/uL — AB (ref 4.22–5.81)
RDW: 16.6 % — ABNORMAL HIGH (ref 11.5–15.5)
WBC: 21.5 10*3/uL — AB (ref 4.0–10.5)

## 2013-12-09 LAB — MAGNESIUM: MAGNESIUM: 2.7 mg/dL — AB (ref 1.5–2.5)

## 2013-12-09 LAB — HEPARIN LEVEL (UNFRACTIONATED): HEPARIN UNFRACTIONATED: 0.36 [IU]/mL (ref 0.30–0.70)

## 2013-12-09 LAB — PHOSPHORUS: PHOSPHORUS: 4.6 mg/dL (ref 2.3–4.6)

## 2013-12-09 MED ORDER — DEXAMETHASONE SODIUM PHOSPHATE 4 MG/ML IJ SOLN: 4.0000 mg | INTRAMUSCULAR | Status: DC

## 2013-12-09 MED ORDER — MORPHINE SULFATE 2 MG/ML IJ SOLN
2.0000 mg | INTRAMUSCULAR | Status: DC | PRN
  Administered 2013-12-09: 2 mg via INTRAVENOUS
  Filled 2013-12-09: qty 2

## 2013-12-09 MED ORDER — MORPHINE SULFATE 2 MG/ML IJ SOLN: 2.0000 mg | INTRAMUSCULAR | Status: DC | PRN

## 2013-12-09 MED ORDER — LEVALBUTEROL HCL 0.63 MG/3ML IN NEBU: 0.6300 mg | INHALATION_SOLUTION | Freq: Four times a day (QID) | RESPIRATORY_TRACT | Status: DC | PRN

## 2013-12-09 MED ORDER — MORPHINE SULFATE (PF) 1 MG/ML IV SOLN
INTRAVENOUS | Status: DC
  Administered 2013-12-09: 10:00:00 via INTRAVENOUS
  Administered 2013-12-09: 5.28 mg via INTRAVENOUS
  Filled 2013-12-09: qty 25

## 2013-12-09 MED ORDER — GUAIFENESIN ER 600 MG PO TB12
600.0000 mg | ORAL_TABLET | Freq: Two times a day (BID) | ORAL | Status: DC | PRN
  Filled 2013-12-09: qty 1

## 2013-12-09 MED ORDER — DEXAMETHASONE SODIUM PHOSPHATE 4 MG/ML IJ SOLN
4.0000 mg | INTRAMUSCULAR | Status: DC
  Filled 2013-12-09: qty 1

## 2013-12-11 NOTE — Progress Notes (Signed)
Nutrition Brief Note  Chart reviewed. Pt now transitioning to comfort care. Plan to d/c to [facility] today. No further nutrition interventions warranted at this time.  Please re-consult as needed.   Derward Marple A. Jimmye Norman, RD, LDN Pager: [phone] After hours Pager: [phone]

## 2013-12-11 NOTE — Progress Notes (Signed)
12-20-2013 1400  Clinical Encounter Type  Visited With Family;Health care provider  Visit Type Initial;Death  Referral From Nurse  Spiritual Encounters  Spiritual Needs Grief support  Stress Factors  Family Stress Factors Loss   Chaplain was paged at 1:56 PM and was notified of the patient's death. Patient's was family was in a consultation room in the unit and needed support. Chaplain introduced himself to the patient's wife and the patient's daughter. Both wife and daughter were crying but they said they were doing well. The patient's wife explained that her husband's health complications started declining four years ago and this gave them plenty of time to get ready for a day like today. Family was saddened but felt they had properly said goodbye. Chaplain gave the family a patient placement card and instructed them to call the number once they had more concrete funeral arrangements. Chaplain walked the family out of the unit and affirmed their celebrations of the patient's life. Heavyn Yearsley, Claudius Sis, Chaplain 2:35 PM

## 2013-12-11 NOTE — Progress Notes (Signed)
Unable to perform CPT at this time due to patient feeling short of breath.  Sats upon entering room were in 60s.  Currently on 100% and flow of 60 on high flow nasal cannula.  Gave nebulizer treatment with improvement of sats to 70s.  MD made aware and non-rebreather mask placed over high flow cannula.  RT will continue to monitor.

## 2013-12-11 NOTE — Progress Notes (Signed)
Patient passed away at 1408.  Family (wife and Daughter) at bedside.  Dr. Nelda Marseille notified.

## 2013-12-11 NOTE — Progress Notes (Signed)
PULMONARY / CRITICAL CARE MEDICINE   Name: John [name] MRN: [id number] DOB: 1933-09-15    ADMISSION DATE:  11/10/2013 CONSULTATION DATE:  12/19/2013  REFERRING MD :  Murvin Natal  CHIEF COMPLAINT:  SOB  INITIAL PRESENTATION: 80 y.o. M followed by RB for COPD and NCSLC s/p resection brought to [facility] ED on 10/17 with SOB.  Was placed on Levaquin roughly 1 week prior for PNA.  Became hypoxic to 84% despite his normal 3L [place].  In ED, was found to have CHF exacerbation and was subsequently admitted.  PCCM was consulted at the request of pt.  STUDIES:  CXR 10/17 >>> COPD with chronic scarring in right base.  Increased interstitial opacities in mid to lower lung zones, possibly from cardiogenic interstitial pulmonary edema. Echo 7/22 >>> EF 40-45%, diffuse hypokinesis, RV overload, mechanical AV, severely dilated LA, mod - severe TR, PAP 72.  SIGNIFICANT EVENTS: 10/17 - admit 10/20 - PCCM consult 10/24- BIPAP needed overnight, 100% NRB this am   SUBJECTIVE:  Decompensating, O2 sats in the 70s with 100% NRB and high flow [place].  VITAL SIGNS: Temp:  [97.3 F (36.3 C)-97.8 F (36.6 C)] 97.3 F (36.3 C) (10/30 0739) Pulse Rate:  [74-110] 88 (10/30 0739) Resp:  [27-34] 28 (10/30 0739) BP: (113-128)/(56-69) 120/56 mmHg (10/30 0739) SpO2:  [77 %-92 %] 78 % (10/30 0812) FiO2 (%):  [96 %-100 %] 100 % (10/30 0812) Weight:  [79.8 kg (175 lb 14.8 oz)] 79.8 kg (175 lb 14.8 oz) (10/30 0645)  HEMODYNAMICS:    VENTILATOR SETTINGS: Vent Mode:  [-]  FiO2 (%):  [96 %-100 %] 100 %  INTAKE / OUTPUT: Intake/Output     10/29 0701 - 10/30 0700 10/30 0701 - 10/31 0700   P.O. 120    I.V. (mL/kg) 334.6 (4.2) 3 (0)   Other     IV Piggyback 300    Total Intake(mL/kg) 754.6 (9.5) 3 (0)   Urine (mL/kg/hr) 875 (0.5)    Total Output 875     Net -120.4 +3         PHYSICAL EXAMINATION: General: chronically ill appearing, difficulty speaking full sentences, respiratory distress HEENT: NCAT, jvd down PULM;  coarse CV: Irreg irreg, systolic murmur AB: BS+, soft, nontender Ext: warm trace edema Neuro: A&Ox4, moving all ext to command  LABS:  CBC  Recent Labs Lab 12/07/13 0325 12/08/13 0320 December 19, 2013 0240  WBC 11.4* 16.2* 21.5*  HGB 11.1* 11.4* 10.9*  HCT 35.0* 36.2* 33.9*  PLT 264 324 287   Coag's  Recent Labs Lab 12/06/13 0246 12/07/13 0325 12/08/13 0320  INR 2.36* 1.78* 2.03*   BMET  Recent Labs Lab 12/07/13 0325 12/08/13 0320 12-19-2013 0240  NA 135* 136* 135*  K 4.6 4.8 4.6  CL 95* 95* 96  CO2 28 26 25   BUN 44* 59* 65*  CREATININE 0.98 1.39* 1.35  GLUCOSE 158* 131* 122*   Electrolytes  Recent Labs Lab 12/07/13 0325 12/08/13 0320 12/19/13 0240  CALCIUM 9.5 9.4 9.2  MG 2.6* 2.8* 2.7*  PHOS 3.0 5.5* 4.6   Sepsis Markers No results found for this basename: LATICACIDVEN, PROCALCITON, O2SATVEN,  in the last 168 hours ABG  Recent Labs Lab 12/04/13 0330  PHART 7.371  PCO2ART 61.6*  PO2ART 79.0*   Liver Enzymes No results found for this basename: AST, ALT, ALKPHOS, BILITOT, ALBUMIN,  in the last 168 hours Cardiac Enzymes  Recent Labs Lab 12/06/13 0246  PROBNP 2835.0*   Glucose  Recent Labs Lab 12/07/13 1655  GLUCAP 134*   Imaging Dg Chest Port 1 View  12/08/2013   CLINICAL DATA:  80 year old male with shortness of breath. Evaluate for pulmonary edema and/or pneumonia.  EXAM: PORTABLE CHEST - 1 VIEW  COMPARISON:  Chest x-ray 12/05/2013.  FINDINGS: Lung volumes are low. Extensive bibasilar opacities may reflect areas of atelectasis and/or consolidation. Small left and small to moderate right pleural effusions appear unchanged. Some cephalization of the pulmonary vasculature is noted. Mild cardiomegaly. Upper mediastinal contours are within normal limits. Atherosclerosis in the thoracic aorta.  IMPRESSION: 1. Although there may be a background of some mild interstitial pulmonary edema, given the extensive bibasilar consolidation, findings are  concerning for multilobar pneumonia or sequela of recent aspiration. 2. Small to moderate right and small left pleural effusions appear unchanged. 3. There is also likely significant bibasilar subsegmental atelectasis. 4. Mild cardiomegaly. 5. Atherosclerosis.   Electronically Signed   By: Vinnie Langton M.D.   On: 12/08/2013 07:51   ASSESSMENT / PLAN:  PULMONARY A: Severe centrilobular emphysema Acute hypoxemic respiratory failure due to atelectasis and pulm edema > now near dry weight but has persistent hypoxemia worse than baseline NSCLC s/p resection Hemoptysis from anticoagulation- resolved  P:   Chest CT with likely recurrence of cancer and LAN and post obstructive PNA Continue spiriva Start morphine drip per palliative recommendations High flow cannula and 100% NRB but will likely expire today DNR  PRN albuterol. Use budesonide / brovana while hospitalized. Hold further diureses. Changed decadron to prednisone PO  CARDIOVASCULAR A:  Acute on chronic systolic heart failure A.fib rate controlled Mechanical aortic valve P:  Increased Cardizem at 60 mg PO q6, will maintain No anticoag given clinical picture and fall risk Hold further diureses as expect patient will decompensate and expire given severe hypoxemia  NEUROLOGIC A:   Deconditioning Anxiety P:   Limit ambulation with worsening hypoxia Keep sats 85-88%  Family updated: Patient and wife made aware of significant decompensation.  Patient is likely to expire today given severe hypoxemia.  Interdisciplinary Family Meeting v Palliative Care Meeting:  Limited options.  Full comfort.  CC time 35 min.  Rush Farmer, M.D. [facility] Pulmonary/Critical Care Medicine. Pager: [phone]. After hours pager: [phone].

## 2013-12-11 NOTE — Progress Notes (Signed)
Patient John [name]      DOB: 1933/12/28      XTG:[id number]   Palliative Medicine Team at [facility] Progress Note    Subjective: Respiratory status declining. John [name] is more lethargic this morning and working harder to breath. His O2 sats have dropped.  Morphine PRN increased frequency and still feels like this is helping.  Long discussion about his clinical course and what to expect and how we will ensure his comfort. He knows his time is short. Denies pain. Feels anxious when working hard to breath.     Filed Vitals:   12-31-13 0739  BP: 120/56  Pulse: 88  Temp: 97.3 F (36.3 C)  Resp: 28   Physical exam: Gen: Alert, moderate resp distress CV: tachy Lungs; Scattered coarse sounds ABD: soft, ND Ext: warm  CBC    Component Value Date/Time   WBC 21.5* 12-31-2013 0240   RBC 3.70* 2013/12/31 0240   RBC 3.17* 10/19/2009 1600   HGB 10.9* 12/31/2013 0240   HCT 33.9* December 31, 2013 0240   PLT 287 31-Dec-2013 0240   MCV 91.6 12-31-2013 0240   MCH 29.5 12/31/13 0240   MCHC 32.2 December 31, 2013 0240   RDW 16.6* 2013/12/31 0240   LYMPHSABS 2.0 02/25/2013 1148   MONOABS 0.9 02/25/2013 1148   EOSABS 0.1 02/25/2013 1148   BASOSABS 0.0 02/25/2013 1148    CMP     Component Value Date/Time   NA 135* 12/31/13 0240   K 4.6 2013-12-31 0240   CL 96 31-Dec-2013 0240   CO2 25 31-Dec-2013 0240   GLUCOSE 122* 2013-12-31 0240   BUN 65* 2013/12/31 0240   CREATININE 1.35 12-31-2013 0240   CALCIUM 9.2 December 31, 2013 0240   PROT 7.8 11/11/2013 1411   PROT 7.8 11/11/2013 1411   ALBUMIN 4.1 11/11/2013 1411   ALBUMIN 4.1 11/11/2013 1411   AST 33 11/11/2013 1411   AST 33 11/11/2013 1411   ALT 14 11/11/2013 1411   ALT 14 11/11/2013 1411   ALKPHOS 91 11/11/2013 1411   ALKPHOS 91 11/11/2013 1411   BILITOT 1.5* 11/11/2013 1411   BILITOT 1.5* 11/11/2013 1411   GFRNONAA 49* 31-Dec-2013 0240   GFRAA 56* 12/31/13 0240     Assessment and plan: 80 yo male with PMHx of COPD, NSCLC who presented with dyspnea.  CT concerning for new lung mass, post-obs PNA.   1. Code Status- DNR   2. Goals of Care- Declined overnight. I think the probability of him being safe to transfer to [facility] is low.  Goal is comfort.  I discussed stopping multiple medications with his wife.  She is in agreement.  She hopes he gets sleepy and does not feel so anxious. She will call daughter to come in today.  Talked extensively about what to expect and I gave her my contact information should we need to be even more aggressive in controlling his symptoms  3. Dyspnea- switch steroids to IV. I will increase PRN morphine dose. Start morphine PCA at 1mg /hr with 1mg  q14min PRN bolus.  Has not needed much morphine to control symptoms but this may change.  Increase nurse driven PRN dose to 2-4mg .  At some point he will loose ability to push PCA button and we may need to titrate infusion further. I will re-assess later today.    Total Time: 45 minutes   >50% of time spent in counseling and coordination of care regarding above assessment and plan.    Doran Clay D.O. Palliative Medicine Team at [facility]  [facility]  Pager: [phone] Team Phone: [phone]

## 2013-12-11 DEATH — deceased

## 2013-12-24 NOTE — Discharge Summary (Signed)
John [name], SESLER               ACCOUNT NO.:  [id number]  MEDICAL RECORD NO.:  [id number]  LOCATION:  2H15C                        FACILITY:  [facility]  PHYSICIAN:  Providence Lanius, MD  DATE OF BIRTH:  Jun 22, 1933  DATE OF ADMISSION:  11/15/2013 DATE OF DISCHARGE:  2013/12/27                              DISCHARGE SUMMARY   DEATH SUMMARY  PRIMARY DIAGNOSIS/CAUSE OF DEATH:  Acute hypoxemic respiratory failure.  SECONDARY DIAGNOSIS:  Severe centrilobular emphysema, non-small cell lung cancer, hemoptysis, acute on chronic systolic heart failure, atrial fibrillation with a mechanical aortic valve deconditioning, failure to thrive, hypertensive heart disease and anxiety.  The patient is a 80 year old male with a history of non-small cell lung cancer presented to the hospital with a chief complaint of shortness of breath.  During the hospitalization, the patient continued to deteriorate from a respiratory standpoint.  Palliative care was consulted.  The palliative care met with the family. The patient was admitted to the intensive care unit and was started on antibiotics and steroids, but continued to have episodes of desaturation overnight.  The patient was changed to full comfort care.  Morphine was started, however, the patient to expire shortly thereafter, the family was at bedside.     Providence Lanius, MD     WJY/MEDQ  D:  12/23/2013  T:  12/24/2013  Job:  [id number]

## 2013-12-30 ENCOUNTER — Ambulatory Visit: Payer: Medicare Other | Admitting: Internal Medicine

## 2015-04-18 LAB — PULMONARY FUNCTION TEST
